# Patient Record
Sex: Female | Born: 1960
Health system: Southern US, Community
[De-identification: ages and names within clinical notes are randomized; demographics above are authoritative.]

## PROBLEM LIST (undated history)

## (undated) DIAGNOSIS — J329 Chronic sinusitis, unspecified: Secondary | ICD-10-CM

## (undated) DIAGNOSIS — Z87898 Personal history of other specified conditions: Secondary | ICD-10-CM

## (undated) DIAGNOSIS — L309 Dermatitis, unspecified: Secondary | ICD-10-CM

## (undated) DIAGNOSIS — I10 Essential (primary) hypertension: Secondary | ICD-10-CM

## (undated) DIAGNOSIS — T8859XA Other complications of anesthesia, initial encounter: Secondary | ICD-10-CM

## (undated) DIAGNOSIS — M1990 Unspecified osteoarthritis, unspecified site: Secondary | ICD-10-CM

## (undated) DIAGNOSIS — G4730 Sleep apnea, unspecified: Secondary | ICD-10-CM

## (undated) DIAGNOSIS — N390 Urinary tract infection, site not specified: Secondary | ICD-10-CM

## (undated) DIAGNOSIS — T4145XA Adverse effect of unspecified anesthetic, initial encounter: Secondary | ICD-10-CM

## (undated) HISTORY — DX: Unspecified osteoarthritis, unspecified site: M19.90

## (undated) HISTORY — DX: Personal history of other specified conditions: Z87.898

## (undated) HISTORY — DX: Chronic sinusitis, unspecified: J32.9

## (undated) HISTORY — DX: Essential (primary) hypertension: I10

## (undated) HISTORY — DX: Dermatitis, unspecified: L30.9

## (undated) HISTORY — DX: Urinary tract infection, site not specified: N39.0

## (undated) MED FILL — Clobetasol Propionate Soln 0.05%: CUTANEOUS | Fill #1 | Status: CN

---
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
## 1982-10-13 HISTORY — PX: OTHER SURGICAL HISTORY: SHX169

## 2002-10-13 HISTORY — PX: OTHER SURGICAL HISTORY: SHX169

## 2003-10-05 ENCOUNTER — Other Ambulatory Visit: Payer: Self-pay

## 2004-12-19 ENCOUNTER — Ambulatory Visit: Payer: Self-pay | Admitting: Internal Medicine

## 2005-09-25 ENCOUNTER — Ambulatory Visit: Payer: Self-pay | Admitting: Internal Medicine

## 2006-02-06 ENCOUNTER — Ambulatory Visit: Payer: Self-pay | Admitting: Obstetrics and Gynecology

## 2007-03-25 ENCOUNTER — Ambulatory Visit: Payer: Self-pay | Admitting: General Practice

## 2007-04-13 ENCOUNTER — Ambulatory Visit: Payer: Self-pay | Admitting: General Practice

## 2008-02-25 ENCOUNTER — Ambulatory Visit: Payer: Self-pay | Admitting: Internal Medicine

## 2008-02-29 ENCOUNTER — Ambulatory Visit: Payer: Self-pay | Admitting: Internal Medicine

## 2008-03-31 ENCOUNTER — Ambulatory Visit: Payer: Self-pay | Admitting: Internal Medicine

## 2008-06-08 ENCOUNTER — Ambulatory Visit: Payer: Self-pay | Admitting: Orthopedic Surgery

## 2008-06-13 ENCOUNTER — Encounter: Payer: Self-pay | Admitting: Orthopedic Surgery

## 2008-07-13 ENCOUNTER — Encounter: Payer: Self-pay | Admitting: Orthopedic Surgery

## 2009-02-22 ENCOUNTER — Ambulatory Visit: Payer: Self-pay | Admitting: Internal Medicine

## 2009-04-05 ENCOUNTER — Ambulatory Visit: Payer: Self-pay | Admitting: Internal Medicine

## 2010-02-21 ENCOUNTER — Encounter: Payer: Self-pay | Admitting: Unknown Physician Specialty

## 2010-03-02 ENCOUNTER — Other Ambulatory Visit: Payer: Self-pay | Admitting: Internal Medicine

## 2010-03-13 ENCOUNTER — Encounter: Payer: Self-pay | Admitting: Unknown Physician Specialty

## 2010-04-09 ENCOUNTER — Ambulatory Visit: Payer: Self-pay | Admitting: Internal Medicine

## 2010-05-03 ENCOUNTER — Ambulatory Visit: Payer: Self-pay | Admitting: Internal Medicine

## 2010-05-17 ENCOUNTER — Other Ambulatory Visit: Payer: Self-pay | Admitting: Unknown Physician Specialty

## 2010-11-22 ENCOUNTER — Ambulatory Visit: Payer: Self-pay | Admitting: Internal Medicine

## 2010-12-05 ENCOUNTER — Encounter: Payer: Self-pay | Admitting: Sports Medicine

## 2010-12-12 ENCOUNTER — Encounter: Payer: Self-pay | Admitting: Sports Medicine

## 2011-10-14 HISTORY — PX: OTHER SURGICAL HISTORY: SHX169

## 2012-04-07 LAB — HM PAP SMEAR

## 2012-04-13 ENCOUNTER — Ambulatory Visit: Payer: Self-pay | Admitting: Unknown Physician Specialty

## 2012-04-13 LAB — HM MAMMOGRAPHY

## 2012-11-29 ENCOUNTER — Encounter: Payer: Self-pay | Admitting: Internal Medicine

## 2012-12-01 ENCOUNTER — Telehealth: Payer: Self-pay | Admitting: Internal Medicine

## 2012-12-01 ENCOUNTER — Encounter: Payer: Self-pay | Admitting: Internal Medicine

## 2012-12-01 ENCOUNTER — Ambulatory Visit (INDEPENDENT_AMBULATORY_CARE_PROVIDER_SITE_OTHER): Payer: 59 | Admitting: Internal Medicine

## 2012-12-01 VITALS — BP 108/78 | HR 80 | Temp 98.4°F | Ht 64.0 in | Wt 122.0 lb

## 2012-12-01 DIAGNOSIS — M6530 Trigger finger, unspecified finger: Secondary | ICD-10-CM

## 2012-12-01 DIAGNOSIS — M549 Dorsalgia, unspecified: Secondary | ICD-10-CM

## 2012-12-01 DIAGNOSIS — I10 Essential (primary) hypertension: Secondary | ICD-10-CM

## 2012-12-01 MED ORDER — TELMISARTAN 40 MG PO TABS: 40.0000 mg | ORAL_TABLET | Freq: Every day | ORAL | Status: DC

## 2012-12-01 NOTE — Telephone Encounter (Signed)
Called patient for clarification. Called pharmacy to correct dose.

## 2012-12-01 NOTE — Telephone Encounter (Signed)
Needing clarification on the patient's Micardis that was e-scripted today.

## 2012-12-04 ENCOUNTER — Other Ambulatory Visit: Payer: Self-pay | Admitting: Internal Medicine

## 2012-12-04 LAB — CBC WITH DIFFERENTIAL/PLATELET
Basophil #: 0 10*3/uL (ref 0.0–0.1)
Eosinophil #: 0.1 10*3/uL (ref 0.0–0.7)
HCT: 40.3 % (ref 35.0–47.0)
HGB: 13.4 g/dL (ref 12.0–16.0)
Lymphocyte #: 1.3 10*3/uL (ref 1.0–3.6)
MCH: 32.5 pg (ref 26.0–34.0)
MCHC: 33.1 g/dL (ref 32.0–36.0)
MCV: 98 fL (ref 80–100)
Neutrophil #: 2.7 10*3/uL (ref 1.4–6.5)
Neutrophil %: 60.4 %
RBC: 4.12 10*6/uL (ref 3.80–5.20)
WBC: 4.5 10*3/uL (ref 3.6–11.0)

## 2012-12-05 ENCOUNTER — Encounter: Payer: Self-pay | Admitting: Internal Medicine

## 2012-12-05 DIAGNOSIS — M6530 Trigger finger, unspecified finger: Secondary | ICD-10-CM | POA: Insufficient documentation

## 2012-12-05 DIAGNOSIS — I10 Essential (primary) hypertension: Secondary | ICD-10-CM | POA: Insufficient documentation

## 2012-12-05 DIAGNOSIS — M549 Dorsalgia, unspecified: Secondary | ICD-10-CM | POA: Insufficient documentation

## 2012-12-05 NOTE — Assessment & Plan Note (Signed)
Some discomfort with palpation as outlined.  Discussed splint.  She prefers to monitor for now.  Follow.

## 2012-12-05 NOTE — Assessment & Plan Note (Signed)
MRI - bilateral neuroforaminal narrowing at the L4-5 level with exiting nerve root compromise and possible mild compression bilaterally.  Mild neuroforaminal narrowing at L5-S1 with possible mild exiting nerve root compromise and mild to moderate thecal sac stenosis at L4-5.  Has seen ortho.  Had physical therapy previously.  Desires no further intervention at this time.  Follow.   Exercise and stretches as discussed.

## 2012-12-05 NOTE — Progress Notes (Signed)
  Subjective:    [name], female    DOB: 03/10/1961, 52 y.o.   MRN: [id number]  HPI 52 year old female with past history of hypertension who comes in today for a scheduled follow up.  She states she has been doing relatively well.  Having some increased discomfort and stiffness in her lower back and hips.  Feels tight.  Some left knee issues as well.  Trigger finger - right third.  Plans to start exercising more.  Does help.  Discussed stretches.  Some fatigue.  Feels she is handling stress relatively well.  Breathing stable.  Blood pressure stable.  Bowels stable.    Past Medical History  Diagnosis Date  . Recurrent sinus infections   . Frequent UTI   . H/O lymphadenopathy     With previous submandibular node removals.  . Eczema   . Hypertension     Current Outpatient Prescriptions on File Prior to Visit  Medication Sig Dispense Refill  . Calcium Carbonate-Vitamin D (CALCIUM-VITAMIN D) 500-200 MG-UNIT per tablet Take 1 tablet by mouth daily.      . fish oil-omega-3 fatty acids 1000 MG capsule Take by mouth daily.      . fluocinonide gel (LIDEX) 0.05 % Apply 1 application topically 2 (two) times daily. Apply to affected area twice a day as needed.      . Multiple Vitamin (MULTIVITAMIN) tablet Take 1 tablet by mouth daily.      Marland Kitchen tretinoin (RETIN-A) 0.05 % cream Apply topically at bedtime. Apply to affected area three times a day as needed for Acne       No current facility-administered medications on file prior to visit.    Review of Systems Patient denies any headache, lightheadedness or dizziness.  No significant sinus or allergy symptoms.  No chest pain, tightness or palpitations.  No increased shortness of breath, cough or congestion.  No nausea or vomiting.  No acid reflux.  No abdominal pain or cramping.  No bowel change, such as diarrhea, constipation, BRBPR or melana.  No urine change.  Back and joint issues as outlined.  Some stiffness.        Objective:   Physical Exam Filed Vitals:   12/01/12 0933  BP: 108/78  Pulse: 80  Temp: 98.4 F (36.9 C)   Blood pressure recheck:  122/82, pulse 46  52 year old female in no acute distress.   HEENT:  Nares- clear.  Oropharynx - without lesions. NECK:  Supple.  Nontender.  No audible bruit.  HEART:  Appears to be regular. LUNGS:  No crackles or wheezing audible.  Respirations even and unlabored.  RADIAL PULSE:  Equal bilaterally.  ABDOMEN:  Soft, nontender.  Bowel sounds present and normal.  No audible abdominal bruit.   EXTREMITIES:  No increased edema present.  DP pulses palpable and equal bilaterally.   MSK:  Increased tenderness to palpation - base of the third finger (right).  Negative straight leg raise.         Assessment & Plan:  INCREASED PSYCHOSOCIAL STRESSORS.  Doing well.  Follow.    HEALTH MAINTENANCE.  Schedule a physical next visit.  Last pap 03/18/12 - negative.  Mammogram 04/13/12 - BiRADS II.  Was to notify me when agreeable for colonoscopy.

## 2012-12-05 NOTE — Assessment & Plan Note (Signed)
Blood pressure is doing well.  Same medication regimen.  Check metabolic panel.  

## 2012-12-06 ENCOUNTER — Telehealth: Payer: Self-pay | Admitting: Internal Medicine

## 2012-12-06 NOTE — Telephone Encounter (Signed)
Patient Information:  Caller Name: [place]  Phone: [phone]  Patient: Sara Perry, Sara Perry  Gender: Female  DOB: [birth year]  Age: 52 Years  PCP: Dale Garceno  Pregnant: No  Office Follow Up:  Does the office need to follow up with this patient?: Yes  Instructions For The Office: Please review and call pt regarding Tamiflu Rx  RN Note:  Pt is requesting Tamiflu; she has not had close contact with anyone with a positive flu test; if MD is willing to call in Tamiflu please call into  [facility] [phone] or [facility] [phone]  Symptoms  Reason For Call & Symptoms: Pt is calling and states that she thinks she has the flu; sx include yellow productive cough, tiredness and fever; feels the cough is in her chest; denies diff breathing;   felt warm and took Motrin and temp 99.0 after Motrin; sx started 12/04/12 in the evening;  Reviewed Health History In EMR: Yes  Reviewed Medications In EMR: Yes  Reviewed Allergies In EMR: Yes  Reviewed Surgeries / Procedures: Yes  Date of Onset of Symptoms: 12/04/2012  Treatments Tried: Motrin; Guifenasen; Sudafed  Treatments Tried Worked: Yes  Any Fever: Yes  Fever Taken: Oral  Fever Time Of Reading: 08:00:00  Fever Last Reading: 99.0 OB / GYN:  LMP: Unknown  Guideline(s) Used:  Influenza - Seasonal  Disposition Per Guideline:   Discuss with PCP and Callback by Nurse Today  Reason For Disposition Reached:   Patient requests antiviral medicine for influenza and flu symptoms present < 48 hours  Advice Given:  Treating the Symptoms of Flu  Fever, Muscle Aches, and Headache: For fever more than 101 F (38.3 C), muscle aches, and headaches, take acetaminophen every 4-6 hours (Adults 650 mg) OR ibuprofen every 6-8 hours (Adults 400-600 mg).  Sore Throat: Use throat lozenges, hard candy or warm chicken broth.  Cough: Use cough drops.  Hydrate: Drink extra liquids. If the air in your home is dry, use a humidifier.  No Aspirin  : Do not use aspirin for treatment of fever or pain (Reason: there is an association between influenza and Reye syndrome).  Expected Course  : The fever lasts 2-3 days, the runny nose 5-10 days, and the cough 2-3 weeks.  Call Back If:  Fever lasts more than 3 days  You become short of breath or worse.  For a Stuffy Nose - Use Nasal Washes:  Introduction: Saline (salt water) nasal irrigation (nasal wash) is an effective and simple home remedy for treating stuffy nose and sinus congestion. The nose can be irrigated by pouring, spraying, or squirting salt water into the nose and then letting it run back out.  For all Fevers  Drink cold fluids to prevent dehydration.

## 2012-12-06 NOTE — Telephone Encounter (Signed)
Discussed pts symptoms.  With fever - on antiinflammatories.  Acute muscle aches.  Called in tamiflu.

## 2012-12-16 ENCOUNTER — Telehealth: Payer: Self-pay | Admitting: *Deleted

## 2012-12-16 NOTE — Telephone Encounter (Signed)
Called patient concerning her labs. Need to ask patient if she had other labs drawn.

## 2012-12-20 NOTE — Telephone Encounter (Signed)
Patient returned call. Patient stated that she did have more labs drawn. She said that she would have other labs sent over to [place].

## 2012-12-20 NOTE — Telephone Encounter (Signed)
Noted.  Hold until receive.   

## 2012-12-23 NOTE — Telephone Encounter (Signed)
Received labs

## 2012-12-24 ENCOUNTER — Telehealth: Payer: Self-pay | Admitting: *Deleted

## 2012-12-24 NOTE — Telephone Encounter (Signed)
Called patient results from [facility].

## 2013-03-22 ENCOUNTER — Encounter: Payer: 59 | Admitting: Internal Medicine

## 2013-05-13 ENCOUNTER — Encounter: Payer: 59 | Admitting: Internal Medicine

## 2013-06-10 ENCOUNTER — Encounter: Payer: 59 | Admitting: Internal Medicine

## 2013-08-02 ENCOUNTER — Other Ambulatory Visit (HOSPITAL_COMMUNITY)
Admission: RE | Admit: 2013-08-02 | Discharge: 2013-08-02 | Disposition: A | Discharge status: Home / Self Care | Payer: 59 | Source: Ambulatory Visit | Attending: Internal Medicine | Admitting: Internal Medicine

## 2013-08-02 ENCOUNTER — Ambulatory Visit (INDEPENDENT_AMBULATORY_CARE_PROVIDER_SITE_OTHER): Payer: 59 | Admitting: Internal Medicine

## 2013-08-02 ENCOUNTER — Encounter: Payer: Self-pay | Admitting: Internal Medicine

## 2013-08-02 VITALS — BP 110/80 | HR 83 | Temp 98.5°F | Ht 64.0 in | Wt 120.8 lb

## 2013-08-02 DIAGNOSIS — R5383 Other fatigue: Secondary | ICD-10-CM

## 2013-08-02 DIAGNOSIS — R5381 Other malaise: Secondary | ICD-10-CM

## 2013-08-02 DIAGNOSIS — Z1151 Encounter for screening for human papillomavirus (HPV): Secondary | ICD-10-CM | POA: Insufficient documentation

## 2013-08-02 DIAGNOSIS — Z01419 Encounter for gynecological examination (general) (routine) without abnormal findings: Secondary | ICD-10-CM | POA: Insufficient documentation

## 2013-08-02 DIAGNOSIS — Z13220 Encounter for screening for lipoid disorders: Secondary | ICD-10-CM

## 2013-08-02 DIAGNOSIS — M549 Dorsalgia, unspecified: Secondary | ICD-10-CM

## 2013-08-02 DIAGNOSIS — Z124 Encounter for screening for malignant neoplasm of cervix: Secondary | ICD-10-CM

## 2013-08-02 DIAGNOSIS — I10 Essential (primary) hypertension: Secondary | ICD-10-CM

## 2013-08-05 ENCOUNTER — Encounter: Payer: Self-pay | Admitting: *Deleted

## 2013-08-07 ENCOUNTER — Encounter: Payer: Self-pay | Admitting: Internal Medicine

## 2013-08-07 ENCOUNTER — Other Ambulatory Visit: Payer: Self-pay | Admitting: Internal Medicine

## 2013-08-07 MED ORDER — FLUOCINONIDE 0.05 % EX GEL: 1.0000 "application " | Freq: Two times a day (BID) | CUTANEOUS | Status: DC

## 2013-08-07 NOTE — Assessment & Plan Note (Signed)
Blood pressure is doing well.  Same medication regimen.  Follow metabolic panel.  

## 2013-08-07 NOTE — Progress Notes (Signed)
Refilled lidex gel.

## 2013-08-07 NOTE — Assessment & Plan Note (Signed)
MRI - bilateral neuroforaminal narrowing at the L4-5 level with exiting nerve root compromise and possible mild compression bilaterally.  Mild neuroforaminal narrowing at L5-S1 with possible mild exiting nerve root compromise and mild to moderate thecal sac stenosis at L4-5.  Has seen ortho.  Had physical therapy previously.  Desires no further intervention at this time.  Follow.   Exercise and stretches have helped.

## 2013-08-07 NOTE — Progress Notes (Signed)
Subjective:    [name], female    DOB: [birth year], 52 y.o.   MRN: [id number]  HPI 52 year old female with past history of hypertension who comes in today to follow up on this as well as for a complete physical exam.  She states she has been doing relatively well.  Was having some increased discomfort and stiffness in her lower back and hips.  She is exercising now.  Walking 3x/week.   This has helped.  She is stretching.   Feels she is handling stress relatively well.  Breathing stable.  Blood pressure stable.  Bowels stable.  She has bunions - bilateral feet.    Past Medical History  Diagnosis Date  . Recurrent sinus infections   . Frequent UTI   . H/O lymphadenopathy     With previous submandibular node removals.  . Eczema   . Hypertension     Current Outpatient Prescriptions on File Prior to Visit  Medication Sig Dispense Refill  . Calcium Carbonate-Vitamin D (CALCIUM-VITAMIN D) 500-200 MG-UNIT per tablet Take 1 tablet by mouth daily.      . fish oil-omega-3 fatty acids 1000 MG capsule Take by mouth daily.      . fluocinonide gel (LIDEX) 0.05 % Apply 1 application topically 2 (two) times daily. Apply to affected area twice a day as needed.      . Multiple Vitamin (MULTIVITAMIN) tablet Take 1 tablet by mouth daily.      Marland Kitchen telmisartan (MICARDIS) 40 MG tablet Take 1/2 tablet by mouth once a day      . tretinoin (RETIN-A) 0.05 % cream Apply topically at bedtime. Apply to affected area three times a day as needed for Acne       No current facility-administered medications on file prior to visit.    Review of Systems Patient denies any headache, lightheadedness or dizziness.  No significant sinus or allergy symptoms.  No chest pain, tightness or palpitations.  No increased shortness of breath, cough or congestion.  No nausea or vomiting.  No acid reflux.  No abdominal pain or cramping.  No bowel change, such as diarrhea, constipation, BRBPR or melana.  No urine change.   Back and joint issues as outlined.  Exercising and stretching helps.  Overall she feels she is doing well.  Blood pressure is doing well.         Objective:   Physical Exam  Filed Vitals:   08/02/13 1454  BP: 110/80  Pulse: 83  Temp: 98.5 F (43.68 C)   52 year old female in no acute distress.   HEENT:  Nares- clear.  Oropharynx - without lesions. NECK:  Supple.  Nontender.  No audible bruit.  HEART:  Appears to be regular. LUNGS:  No crackles or wheezing audible.  Respirations even and unlabored.  RADIAL PULSE:  Equal bilaterally.    BREASTS:  No nipple discharge or nipple retraction present.  Could not appreciate any distinct nodules or axillary adenopathy.  ABDOMEN:  Soft, nontender.  Bowel sounds present and normal.  No audible abdominal bruit.  GU:  Normal external genitalia.  Vaginal vault without lesions.  Cervix identified.  Pap performed. Could not appreciate any adnexal masses or tenderness.   RECTAL:  Heme negative.   EXTREMITIES:  No increased edema present.  DP pulses palpable and equal bilaterally.          Assessment & Plan:  INCREASED PSYCHOSOCIAL STRESSORS.  Doing well.  Follow.    HEALTH MAINTENANCE.  Physical today.  Last pap 03/18/12 - negative.  Mammogram 04/13/12 - BiRADS II.  Was given information to schedule her mammogram for 2014.  Was to notify me when agreeable for colonoscopy.  Not ready yet.  Did agree to IFOB.

## 2013-08-15 ENCOUNTER — Telehealth: Payer: Self-pay | Admitting: *Deleted

## 2013-08-15 ENCOUNTER — Other Ambulatory Visit (INDEPENDENT_AMBULATORY_CARE_PROVIDER_SITE_OTHER): Payer: 59

## 2013-08-15 DIAGNOSIS — R5381 Other malaise: Secondary | ICD-10-CM

## 2013-08-15 DIAGNOSIS — I10 Essential (primary) hypertension: Secondary | ICD-10-CM

## 2013-08-15 DIAGNOSIS — R5383 Other fatigue: Secondary | ICD-10-CM

## 2013-08-15 DIAGNOSIS — Z13220 Encounter for screening for lipoid disorders: Secondary | ICD-10-CM

## 2013-08-15 LAB — COMPREHENSIVE METABOLIC PANEL
ALT: 30 U/L (ref 0–35)
AST: 30 U/L (ref 0–37)
Albumin: 4.4 g/dL (ref 3.5–5.2)
Alkaline Phosphatase: 56 U/L (ref 39–117)
BUN: 15 mg/dL (ref 6–23)
Calcium: 9.7 mg/dL (ref 8.4–10.5)
Glucose, Bld: 107 mg/dL — ABNORMAL HIGH (ref 70–99)
Potassium: 4.5 mEq/L (ref 3.5–5.1)
Sodium: 141 mEq/L (ref 135–145)
Total Bilirubin: 1.7 mg/dL — ABNORMAL HIGH (ref 0.3–1.2)

## 2013-08-15 LAB — CBC WITH DIFFERENTIAL/PLATELET
Basophils Absolute: 0 10*3/uL (ref 0.0–0.1)
Basophils Relative: 0.4 % (ref 0.0–3.0)
Eosinophils Absolute: 0.1 10*3/uL (ref 0.0–0.7)
Eosinophils Relative: 1.3 % (ref 0.0–5.0)
HCT: 40 % (ref 36.0–46.0)
Lymphocytes Relative: 34.3 % (ref 12.0–46.0)
Lymphs Abs: 1.7 10*3/uL (ref 0.7–4.0)
MCHC: 33.7 g/dL (ref 30.0–36.0)
MCV: 95.8 fl (ref 78.0–100.0)
Monocytes Absolute: 0.4 10*3/uL (ref 0.1–1.0)
Neutrophils Relative %: 56.7 % (ref 43.0–77.0)
Platelets: 265 10*3/uL (ref 150.0–400.0)
WBC: 5 10*3/uL (ref 4.5–10.5)

## 2013-08-15 LAB — LIPID PANEL
Cholesterol: 232 mg/dL — ABNORMAL HIGH (ref 0–200)
HDL: 61.4 mg/dL (ref >39.00)
Total CHOL/HDL Ratio: 4
VLDL: 24.6 mg/dL (ref 0.0–40.0)

## 2013-08-15 LAB — LDL CHOLESTEROL, DIRECT: Direct LDL: 148.9 mg/dL

## 2013-08-15 LAB — TSH: TSH: 1.42 u[IU]/mL (ref 0.35–5.50)

## 2013-08-15 NOTE — Telephone Encounter (Signed)
Pt states she would like a liquid for this medication fluocinonide gel (LIDEX) 0.05 %

## 2013-08-16 ENCOUNTER — Other Ambulatory Visit (INDEPENDENT_AMBULATORY_CARE_PROVIDER_SITE_OTHER): Payer: 59

## 2013-08-16 ENCOUNTER — Other Ambulatory Visit: Payer: Self-pay | Admitting: Internal Medicine

## 2013-08-16 DIAGNOSIS — R17 Unspecified jaundice: Secondary | ICD-10-CM

## 2013-08-16 LAB — BILIRUBIN, DIRECT: Bilirubin, Direct: 0.2 mg/dL (ref 0.0–0.3)

## 2013-08-16 NOTE — Telephone Encounter (Signed)
Please call [facility] pharmacy.  I sent in a rx for the lidex gel.  Apparently she is wanting something different (liquid).  I am ok to refill x 1 the Lidex liquid (or suspension).  Apparently she has had filled at this pharmacy previously.  Please cancel the other rx and refill the liquid x 1.  Thanks.

## 2013-08-16 NOTE — Progress Notes (Signed)
Orders placed for f/u direct bilirubin

## 2013-08-17 ENCOUNTER — Other Ambulatory Visit: Payer: Self-pay | Admitting: Internal Medicine

## 2013-08-17 NOTE — Progress Notes (Signed)
F/u liver panel ordered.  

## 2013-08-17 NOTE — Telephone Encounter (Signed)
Pt is requesting to have both the gel & liquid. Pharmacy notified-okay to refill x 1

## 2013-08-18 ENCOUNTER — Encounter: Payer: Self-pay | Admitting: *Deleted

## 2013-09-12 ENCOUNTER — Other Ambulatory Visit: Payer: 59

## 2013-09-13 ENCOUNTER — Other Ambulatory Visit: Payer: 59

## 2013-09-14 ENCOUNTER — Other Ambulatory Visit (INDEPENDENT_AMBULATORY_CARE_PROVIDER_SITE_OTHER): Payer: 59

## 2013-09-14 DIAGNOSIS — R17 Unspecified jaundice: Secondary | ICD-10-CM

## 2013-09-14 LAB — HEPATIC FUNCTION PANEL
ALT: 24 U/L (ref 0–35)
AST: 17 U/L (ref 0–37)
Albumin: 4.4 g/dL (ref 3.5–5.2)
Total Protein: 7 g/dL (ref 6.0–8.3)

## 2013-09-19 ENCOUNTER — Encounter: Payer: Self-pay | Admitting: Internal Medicine

## 2013-09-19 DIAGNOSIS — R945 Abnormal results of liver function studies: Secondary | ICD-10-CM

## 2013-09-20 NOTE — Telephone Encounter (Signed)
error 

## 2013-09-29 NOTE — Telephone Encounter (Signed)
Mailed unread message to pt  

## 2013-11-10 ENCOUNTER — Other Ambulatory Visit: Payer: Self-pay | Admitting: Internal Medicine

## 2014-02-02 ENCOUNTER — Ambulatory Visit (INDEPENDENT_AMBULATORY_CARE_PROVIDER_SITE_OTHER): Payer: 59 | Admitting: Internal Medicine

## 2014-02-02 ENCOUNTER — Encounter: Payer: Self-pay | Admitting: Internal Medicine

## 2014-02-02 VITALS — BP 110/70 | HR 80 | Temp 98.3°F | Ht 64.0 in | Wt 117.8 lb

## 2014-02-02 DIAGNOSIS — M542 Cervicalgia: Secondary | ICD-10-CM

## 2014-02-02 DIAGNOSIS — I10 Essential (primary) hypertension: Secondary | ICD-10-CM

## 2014-02-02 DIAGNOSIS — M549 Dorsalgia, unspecified: Secondary | ICD-10-CM

## 2014-02-02 DIAGNOSIS — E7800 Pure hypercholesterolemia, unspecified: Secondary | ICD-10-CM

## 2014-02-02 NOTE — Progress Notes (Signed)
Pre visit review using our clinic review tool, if applicable. No additional management support is needed unless otherwise documented below in the visit note. 

## 2014-02-02 NOTE — Progress Notes (Signed)
Subjective:    [name], female    DOB: 20-Sep-1961, 53 y.o.   MRN: [id number]  HPI 53 year old female with past history of hypertension who comes in today for a scheduled follow up.  She states she has been doing relatively well.  Was having some increased discomfort and stiffness in her lower back and hips.  She is exercising now.  Walking 3x/week.   This has helped.  No lower back pain.  No leg numbness.  She is stretching.   She has been having increased pain in her neck and shoulders.  Massage therapy helps.  Feels she is handling stress relatively well.  Breathing stable.  Blood pressure stable.  Bowels stable.     Past Medical History  Diagnosis Date  . Recurrent sinus infections   . Frequent UTI   . H/O lymphadenopathy     With previous submandibular node removals.  . Eczema   . Hypertension     Current Outpatient Prescriptions on File Prior to Visit  Medication Sig Dispense Refill  . Calcium Carbonate-Vitamin D (CALCIUM-VITAMIN D) 500-200 MG-UNIT per tablet Take 1 tablet by mouth daily.      . fish oil-omega-3 fatty acids 1000 MG capsule Take by mouth daily.      . fluocinonide gel (LIDEX) 3.30 % Apply 1 application topically 2 (two) times daily. Apply to affected area twice a day as needed.  60 g  0  . Multiple Vitamin (MULTIVITAMIN) tablet Take 1 tablet by mouth daily.      Marland Kitchen telmisartan (MICARDIS) 40 MG tablet TAKE 1/2 TABLET BY MOUTH ONCE DAILY  45 tablet  1  . tretinoin (RETIN-A) 0.05 % cream Apply topically at bedtime. Apply to affected area three times a day as needed for Acne       No current facility-administered medications on file prior to visit.    Review of Systems Patient denies any headache, lightheadedness or dizziness.  No significant sinus or allergy symptoms.  No chest pain, tightness or palpitations.  No increased shortness of breath, cough or congestion.  No nausea or vomiting.  No acid reflux. No abdominal pain or cramping.  No bowel  change, such as diarrhea, constipation, BRBPR or melana.  No urine change. Lower back better.  Some neck and shoulder issues now.   Exercising and stretching helps.  Massage therapy helps.  Overall she feels she is doing well.  Blood pressure is doing well.         Objective:   Physical Exam  Filed Vitals:   02/02/14 1539  BP: 110/70  Pulse: 80  Temp: 98.3 F (8.86 C)   53 year old female in no acute distress.   HEENT:  Nares- clear.  Oropharynx - without lesions. NECK:  Supple.  Nontender.  No audible bruit.  HEART:  Appears to be regular. LUNGS:  No crackles or wheezing audible.  Respirations even and unlabored.  RADIAL PULSE:  Equal bilaterally.   ABDOMEN:  Soft, nontender.  Bowel sounds present and normal.  No audible abdominal bruit.   EXTREMITIES:  No increased edema present.  DP pulses palpable and equal bilaterally.          Assessment & Plan:  INCREASED PSYCHOSOCIAL STRESSORS.  Doing well.  Follow.    HEALTH MAINTENANCE.  Physical 08/02/13.  Last pap 03/18/12 - negative.  Mammogram 04/13/12 - BiRADS II.  Was given information to schedule her mammogram for 2014.  Do not have results.  Was to  notify me when agreeable for colonoscopy.    I spent 25 minutes with the patient and more than 50% of the time was spent in consultation regarding the above.

## 2014-02-05 ENCOUNTER — Encounter: Payer: Self-pay | Admitting: Internal Medicine

## 2014-02-05 DIAGNOSIS — M542 Cervicalgia: Secondary | ICD-10-CM | POA: Insufficient documentation

## 2014-02-05 DIAGNOSIS — E7800 Pure hypercholesterolemia, unspecified: Secondary | ICD-10-CM | POA: Insufficient documentation

## 2014-02-05 NOTE — Assessment & Plan Note (Signed)
Blood pressure is doing well.  Same medication regimen.  Follow metabolic panel.  

## 2014-02-05 NOTE — Assessment & Plan Note (Signed)
Low cholesterol diet and exercise.  Follow lipid profile.    

## 2014-02-05 NOTE — Assessment & Plan Note (Signed)
Neck and shoulder pain as outlined.  Massage therapy helping.  rx given for massage therapy.

## 2014-02-05 NOTE — Assessment & Plan Note (Signed)
MRI - bilateral neuroforaminal narrowing at the L4-5 level with exiting nerve root compromise and possible mild compression bilaterally.  Mild neuroforaminal narrowing at L5-S1 with possible mild exiting nerve root compromise and mild to moderate thecal sac stenosis at L4-5.  Has seen ortho.  Had physical therapy previously.  Desires no further intervention at this time.  Follow.   Exercise and stretches have helped.  Back and legs better.

## 2014-02-22 ENCOUNTER — Other Ambulatory Visit (INDEPENDENT_AMBULATORY_CARE_PROVIDER_SITE_OTHER): Payer: 59

## 2014-02-22 ENCOUNTER — Other Ambulatory Visit: Payer: 59

## 2014-02-22 DIAGNOSIS — E7800 Pure hypercholesterolemia, unspecified: Secondary | ICD-10-CM

## 2014-02-22 DIAGNOSIS — I10 Essential (primary) hypertension: Secondary | ICD-10-CM

## 2014-02-22 LAB — COMPREHENSIVE METABOLIC PANEL
ALBUMIN: 4.4 g/dL (ref 3.5–5.2)
ALT: 17 U/L (ref 0–35)
AST: 16 U/L (ref 0–37)
Alkaline Phosphatase: 49 U/L (ref 39–117)
BILIRUBIN TOTAL: 2.1 mg/dL — AB (ref 0.2–1.2)
BUN: 15 mg/dL (ref 6–23)
CHLORIDE: 103 meq/L (ref 96–112)
CO2: 29 meq/L (ref 19–32)
Calcium: 9.6 mg/dL (ref 8.4–10.5)
Creatinine, Ser: 0.7 mg/dL (ref 0.4–1.2)
GFR: 93.21 mL/min (ref >60.00)
GLUCOSE: 85 mg/dL (ref 70–99)
POTASSIUM: 3.9 meq/L (ref 3.5–5.1)
Sodium: 139 mEq/L (ref 135–145)
TOTAL PROTEIN: 6.7 g/dL (ref 6.0–8.3)

## 2014-02-22 LAB — LIPID PANEL
Cholesterol: 224 mg/dL — ABNORMAL HIGH (ref 0–200)
HDL: 61 mg/dL (ref >39.00)
LDL Cholesterol: 144 mg/dL — ABNORMAL HIGH (ref 0–99)
Total CHOL/HDL Ratio: 4
Triglycerides: 97 mg/dL (ref 0.0–149.0)
VLDL: 19.4 mg/dL (ref 0.0–40.0)

## 2014-02-23 ENCOUNTER — Other Ambulatory Visit (INDEPENDENT_AMBULATORY_CARE_PROVIDER_SITE_OTHER): Payer: 59

## 2014-02-23 ENCOUNTER — Other Ambulatory Visit: Payer: Self-pay | Admitting: Internal Medicine

## 2014-02-23 DIAGNOSIS — R17 Unspecified jaundice: Secondary | ICD-10-CM

## 2014-02-23 LAB — BILIRUBIN, DIRECT: BILIRUBIN DIRECT: 0.2 mg/dL (ref 0.0–0.3)

## 2014-02-23 NOTE — Progress Notes (Signed)
Order placed for direct bilirubin 

## 2014-02-27 ENCOUNTER — Encounter: Payer: Self-pay | Admitting: Internal Medicine

## 2014-02-28 ENCOUNTER — Other Ambulatory Visit: Payer: 59

## 2014-03-01 NOTE — Telephone Encounter (Signed)
Unread mychart message mailed to patient 

## 2014-03-02 NOTE — Telephone Encounter (Signed)
Order placed for abdominal ultrasound.   

## 2014-03-03 NOTE — Telephone Encounter (Signed)
Patient aware of date/time and location/msn

## 2014-03-09 ENCOUNTER — Ambulatory Visit: Payer: Self-pay | Admitting: Internal Medicine

## 2014-03-09 ENCOUNTER — Other Ambulatory Visit: Payer: Self-pay | Admitting: Internal Medicine

## 2014-03-09 DIAGNOSIS — R7989 Other specified abnormal findings of blood chemistry: Secondary | ICD-10-CM

## 2014-03-09 DIAGNOSIS — R945 Abnormal results of liver function studies: Secondary | ICD-10-CM

## 2014-03-09 NOTE — Progress Notes (Signed)
Order placed for limited ultrasound.

## 2014-04-18 ENCOUNTER — Telehealth: Payer: Self-pay | Admitting: Internal Medicine

## 2014-04-18 NOTE — Telephone Encounter (Signed)
Pt came in to office with paperwork to be completed by Dr. Nicki Reaper for medical reimbursement.  Copy made, fee form attached; placed in Dr. Bary Leriche box.  Pt would like a call when ready for pick up.

## 2014-04-19 NOTE — Telephone Encounter (Signed)
See attached.  Just need to know test.  See her message and my previous message.

## 2014-04-19 NOTE — Telephone Encounter (Signed)
Placed in your folder.

## 2014-04-19 NOTE — Telephone Encounter (Signed)
The form she wants completed - what test is this for?  Is this for her abdominal ultrasound.  Just let me know and will complete.  Thanks.

## 2014-04-20 NOTE — Telephone Encounter (Signed)
See below

## 2014-04-21 NOTE — Telephone Encounter (Signed)
It was exercise (as prescribed)-she states that's why she left you both forms

## 2014-04-24 NOTE — Telephone Encounter (Signed)
I completed her form.  In your box.  There is nothing on there where she has signed stating ok for [place] to send medical information.  I have included a medical diagnosis on her papers.  Need to make sure ok with her to send.

## 2014-04-25 ENCOUNTER — Encounter: Payer: Self-pay | Admitting: *Deleted

## 2014-04-25 NOTE — Telephone Encounter (Signed)
Sent [employer]. Will wait on response prior to faxing from.

## 2014-04-26 NOTE — Telephone Encounter (Signed)
Pt returned call & gave permission to fax form. Form faxed

## 2014-04-26 NOTE — Telephone Encounter (Signed)
Left detailed message on patient's voice mail.

## 2014-04-26 NOTE — Telephone Encounter (Signed)
Left message to call back with verbal okay to fax form

## 2014-04-30 ENCOUNTER — Telehealth: Payer: Self-pay | Admitting: Internal Medicine

## 2014-04-30 NOTE — Telephone Encounter (Signed)
Pt notified of ultrasound results and need for non fasting lab within the next two weeks.  Please schedule and contact her with an appt date and time. Thanks.

## 2014-05-01 NOTE — Telephone Encounter (Signed)
Spoke with pt, she will call back for appointment.

## 2014-05-04 ENCOUNTER — Encounter: Payer: Self-pay | Admitting: Internal Medicine

## 2014-05-15 ENCOUNTER — Encounter: Payer: Self-pay | Admitting: Internal Medicine

## 2014-05-15 ENCOUNTER — Other Ambulatory Visit (INDEPENDENT_AMBULATORY_CARE_PROVIDER_SITE_OTHER): Payer: 59

## 2014-05-15 DIAGNOSIS — R17 Unspecified jaundice: Secondary | ICD-10-CM

## 2014-05-15 LAB — HEPATIC FUNCTION PANEL
ALT: 18 U/L (ref 0–35)
AST: 19 U/L (ref 0–37)
Albumin: 4.2 g/dL (ref 3.5–5.2)
Alkaline Phosphatase: 56 U/L (ref 39–117)
Bilirubin, Direct: 0.2 mg/dL (ref 0.0–0.3)
Total Bilirubin: 1.3 mg/dL — ABNORMAL HIGH (ref 0.2–1.2)
Total Protein: 6.4 g/dL (ref 6.0–8.3)

## 2014-05-17 NOTE — Telephone Encounter (Signed)
Unread mychart message mailed to patient 

## 2014-05-25 ENCOUNTER — Other Ambulatory Visit: Payer: Self-pay | Admitting: Internal Medicine

## 2014-08-08 ENCOUNTER — Encounter: Payer: Self-pay | Admitting: Internal Medicine

## 2014-08-08 ENCOUNTER — Ambulatory Visit (INDEPENDENT_AMBULATORY_CARE_PROVIDER_SITE_OTHER): Payer: 59 | Admitting: Internal Medicine

## 2014-08-08 VITALS — BP 120/70 | HR 80 | Temp 98.7°F | Ht 64.0 in | Wt 114.2 lb

## 2014-08-08 DIAGNOSIS — M545 Low back pain: Secondary | ICD-10-CM

## 2014-08-08 DIAGNOSIS — I10 Essential (primary) hypertension: Secondary | ICD-10-CM

## 2014-08-08 DIAGNOSIS — Z1239 Encounter for other screening for malignant neoplasm of breast: Secondary | ICD-10-CM

## 2014-08-08 DIAGNOSIS — E7800 Pure hypercholesterolemia, unspecified: Secondary | ICD-10-CM

## 2014-08-08 DIAGNOSIS — M79673 Pain in unspecified foot: Secondary | ICD-10-CM

## 2014-08-08 NOTE — Progress Notes (Signed)
Pre visit review using our clinic review tool, if applicable. No additional management support is needed unless otherwise documented below in the visit note. 

## 2014-08-13 ENCOUNTER — Encounter: Payer: Self-pay | Admitting: Internal Medicine

## 2014-08-13 DIAGNOSIS — M79673 Pain in unspecified foot: Secondary | ICD-10-CM | POA: Insufficient documentation

## 2014-08-13 NOTE — Progress Notes (Signed)
Subjective:    [name], female    DOB: 1961-01-18, 53 y.o.   MRN: [id number]  HPI 53 year old female with past history of hypertension who comes in today to follow up on these issues as well as for a complete physical exam.  She states she has been doing relatively well.  Was having some increased discomfort and stiffness in her lower back and hips.  She is exercising now.  Walking 3x/week.  This has helped.  Feels she is handling stress relatively well.  Breathing stable.  Blood pressure stable.  Bowels stable.  She is having pain in both of her feet.  Request referral to podiatry.  She will call back with the name.     Past Medical History  Diagnosis Date  . Recurrent sinus infections   . Frequent UTI   . H/O lymphadenopathy     With previous submandibular node removals.  . Eczema   . Hypertension     Current Outpatient Prescriptions on File Prior to Visit  Medication Sig Dispense Refill  . Calcium Carbonate-Vitamin D (CALCIUM-VITAMIN D) 500-200 MG-UNIT per tablet Take 1 tablet by mouth daily.    . fish oil-omega-3 fatty acids 1000 MG capsule Take by mouth daily.    . fluocinonide (LIDEX) 0.05 % external solution Apply 1 application topically as needed.    . fluocinonide gel (LIDEX) 3.15 % Apply 1 application topically 2 (two) times daily. Apply to affected area twice a day as needed. 60 g 0  . Multiple Vitamin (MULTIVITAMIN) tablet Take 1 tablet by mouth daily.    Marland Kitchen telmisartan (MICARDIS) 40 MG tablet TAKE 1/2 TABLET BY MOUTH ONCE DAILY 45 tablet 1  . tretinoin (RETIN-A) 0.05 % cream Apply topically at bedtime. Apply to affected area three times a day as needed for Acne     No current facility-administered medications on file prior to visit.    Review of Systems Patient denies any headache, lightheadedness or dizziness.  No significant sinus or allergy symptoms.  No chest pain, tightness or palpitations.  No increased shortness of breath, cough or  congestion.  No nausea or vomiting.  No acid reflux. No abdominal pain or cramping.  No bowel change, such as diarrhea, constipation, BRBPR or melana.  No urine change. Lower back better.   Exercising and stretching helps.  Is walking.  Overall she feels she is doing well.  Blood pressure is doing well.  Foot pain as outlined.         Objective:   Physical Exam  Filed Vitals:   08/08/14 1553  BP: 120/70  Pulse: 80  Temp: 98.7 F (37.1 C)   Blood pressure recheck:  32/57  53 year old female in no acute distress.   HEENT:  Nares- clear.  Oropharynx - without lesions. NECK:  Supple.  Nontender.  No audible bruit.  HEART:  Appears to be regular. LUNGS:  No crackles or wheezing audible.  Respirations even and unlabored.  RADIAL PULSE:  Equal bilaterally.    BREASTS:  No nipple discharge or nipple retraction present.  Could not appreciate any distinct nodules or axillary adenopathy.  ABDOMEN:  Soft, nontender.  Bowel sounds present and normal.  No audible abdominal bruit.  GU:  Not performed.    EXTREMITIES:  No increased edema present.  DP pulses palpable and equal bilaterally.          Assessment & Plan:  INCREASED PSYCHOSOCIAL STRESSORS.  Doing well.  Follow.  Breast cancer screening - MM DIGITAL SCREENING BILATERAL; Future  Essential hypertension, benign Blood pressure doing well.  Follow.    Low back pain without sciatica, unspecified back pain laterality Better with exercising.  Follow.   Hypercholesterolemia Low cholesterol diet.  Follow lipid panel.   Foot pain, unspecified laterality Pain as outlined.  Persistent.  Will refer to podiatry.  She will call back with the name of a podiatrist.     HEALTH MAINTENANCE.  Physical today.  Last pap 07/23/13 - negative with negative HPV.  Mammogram 04/13/12 - BiRADS II.  Was given information to schedule her mammogram for 2014.  She did not schedule.  Overdue.  Will schedule.  Notify me when agreeable for colonoscopy.    I  spent 25 minutes with the patient and more than 50% of the time was spent in consultation regarding the above.

## 2014-08-21 ENCOUNTER — Other Ambulatory Visit (INDEPENDENT_AMBULATORY_CARE_PROVIDER_SITE_OTHER): Payer: 59

## 2014-08-21 DIAGNOSIS — I10 Essential (primary) hypertension: Secondary | ICD-10-CM

## 2014-08-21 DIAGNOSIS — E7800 Pure hypercholesterolemia, unspecified: Secondary | ICD-10-CM

## 2014-08-21 LAB — LIPID PANEL
CHOL/HDL RATIO: 4
CHOLESTEROL: 210 mg/dL — AB (ref 0–200)
HDL: 58.5 mg/dL (ref >39.00)
LDL Cholesterol: 136 mg/dL — ABNORMAL HIGH (ref 0–99)
NonHDL: 151.5
TRIGLYCERIDES: 80 mg/dL (ref 0.0–149.0)
VLDL: 16 mg/dL (ref 0.0–40.0)

## 2014-08-21 LAB — COMPREHENSIVE METABOLIC PANEL
ALK PHOS: 58 U/L (ref 39–117)
ALT: 21 U/L (ref 0–35)
AST: 20 U/L (ref 0–37)
Albumin: 3.7 g/dL (ref 3.5–5.2)
BILIRUBIN TOTAL: 1.6 mg/dL — AB (ref 0.2–1.2)
BUN: 12 mg/dL (ref 6–23)
CO2: 28 mEq/L (ref 19–32)
CREATININE: 0.8 mg/dL (ref 0.4–1.2)
Calcium: 9.7 mg/dL (ref 8.4–10.5)
Chloride: 102 mEq/L (ref 96–112)
GFR: 79.74 mL/min (ref >60.00)
GLUCOSE: 91 mg/dL (ref 70–99)
Potassium: 4.9 mEq/L (ref 3.5–5.1)
SODIUM: 140 meq/L (ref 135–145)
TOTAL PROTEIN: 6.8 g/dL (ref 6.0–8.3)

## 2014-08-21 LAB — CBC WITH DIFFERENTIAL/PLATELET
Basophils Absolute: 0 10*3/uL (ref 0.0–0.1)
Basophils Relative: 0.3 % (ref 0.0–3.0)
EOS PCT: 1.4 % (ref 0.0–5.0)
Eosinophils Absolute: 0.1 10*3/uL (ref 0.0–0.7)
HEMATOCRIT: 40.2 % (ref 36.0–46.0)
Hemoglobin: 13 g/dL (ref 12.0–15.0)
LYMPHS ABS: 1.6 10*3/uL (ref 0.7–4.0)
Lymphocytes Relative: 31.6 % (ref 12.0–46.0)
MCHC: 32.3 g/dL (ref 30.0–36.0)
MCV: 97.7 fl (ref 78.0–100.0)
MONOS PCT: 8.2 % (ref 3.0–12.0)
Monocytes Absolute: 0.4 10*3/uL (ref 0.1–1.0)
Neutro Abs: 3 10*3/uL (ref 1.4–7.7)
Neutrophils Relative %: 58.5 % (ref 43.0–77.0)
Platelets: 275 10*3/uL (ref 150.0–400.0)
RBC: 4.12 Mil/uL (ref 3.87–5.11)
RDW: 12.9 % (ref 11.5–15.5)
WBC: 5.1 10*3/uL (ref 4.0–10.5)

## 2014-08-21 LAB — TSH: TSH: 1.67 u[IU]/mL (ref 0.35–4.50)

## 2014-08-22 ENCOUNTER — Other Ambulatory Visit: Payer: Self-pay | Admitting: Internal Medicine

## 2014-08-22 ENCOUNTER — Other Ambulatory Visit (INDEPENDENT_AMBULATORY_CARE_PROVIDER_SITE_OTHER): Payer: 59

## 2014-08-22 LAB — BILIRUBIN, DIRECT: Bilirubin, Direct: 0.2 mg/dL (ref 0.0–0.3)

## 2014-08-22 NOTE — Progress Notes (Signed)
Order placed for add on lab.  °

## 2014-08-23 ENCOUNTER — Encounter: Payer: Self-pay | Admitting: Internal Medicine

## 2014-11-06 ENCOUNTER — Ambulatory Visit: Payer: Self-pay | Admitting: Internal Medicine

## 2014-11-06 LAB — HM MAMMOGRAPHY: HM MAMMO: NEGATIVE

## 2014-11-07 ENCOUNTER — Encounter: Payer: Self-pay | Admitting: *Deleted

## 2014-11-10 ENCOUNTER — Encounter: Payer: Self-pay | Admitting: Internal Medicine

## 2015-01-17 ENCOUNTER — Other Ambulatory Visit: Payer: Self-pay | Admitting: Internal Medicine

## 2015-02-01 ENCOUNTER — Telehealth: Payer: Self-pay

## 2015-02-01 DIAGNOSIS — M545 Low back pain, unspecified: Secondary | ICD-10-CM

## 2015-02-01 NOTE — Telephone Encounter (Signed)
Order placed for neurosurgery referral - Dr Sherwood Gambler.

## 2015-02-01 NOTE — Telephone Encounter (Signed)
Patient called Sara Perry requesting a Referral to a Neuro Specialist for her back.   Called the patient to get clarity.  Patient had tried to schedule an appt. with Dr. Sherwood Gambler a Neurosurgery and spine specialist on her own, but the doctor's office refused without a referral.  Patient stated that her pain is more in her hip region and is much worse then when you saw her last year. She believe this to be related to her back not her legs like the past.  She has a upcoming appt in May.   Verbalized that if you have other options for a referral she is open to options.  Please advise?

## 2015-02-02 NOTE — Telephone Encounter (Signed)
Left message, notifying pt referral has been placed and [facility] will contact with an appt.

## 2015-02-08 ENCOUNTER — Ambulatory Visit: Payer: 59 | Admitting: Internal Medicine

## 2015-02-27 ENCOUNTER — Other Ambulatory Visit: Payer: Self-pay | Admitting: Neurosurgery

## 2015-02-27 DIAGNOSIS — M4726 Other spondylosis with radiculopathy, lumbar region: Secondary | ICD-10-CM

## 2015-03-08 ENCOUNTER — Ambulatory Visit: Payer: 59 | Admitting: Internal Medicine

## 2015-03-09 ENCOUNTER — Ambulatory Visit
Admission: RE | Admit: 2015-03-09 | Discharge: 2015-03-09 | Disposition: A | Discharge status: Home / Self Care | Payer: 59 | Source: Ambulatory Visit | Attending: Neurosurgery | Admitting: Neurosurgery

## 2015-03-09 DIAGNOSIS — M5136 Other intervertebral disc degeneration, lumbar region: Secondary | ICD-10-CM | POA: Diagnosis not present

## 2015-03-09 DIAGNOSIS — M5387 Other specified dorsopathies, lumbosacral region: Secondary | ICD-10-CM | POA: Diagnosis not present

## 2015-03-09 DIAGNOSIS — M4726 Other spondylosis with radiculopathy, lumbar region: Secondary | ICD-10-CM

## 2015-03-09 DIAGNOSIS — M47816 Spondylosis without myelopathy or radiculopathy, lumbar region: Secondary | ICD-10-CM | POA: Diagnosis not present

## 2015-05-29 ENCOUNTER — Encounter: Payer: Self-pay | Admitting: Internal Medicine

## 2015-05-29 ENCOUNTER — Ambulatory Visit (INDEPENDENT_AMBULATORY_CARE_PROVIDER_SITE_OTHER): Payer: 59 | Admitting: Internal Medicine

## 2015-05-29 VITALS — BP 116/72 | HR 94 | Temp 98.2°F | Ht 64.0 in | Wt 118.4 lb

## 2015-05-29 DIAGNOSIS — E7800 Pure hypercholesterolemia, unspecified: Secondary | ICD-10-CM

## 2015-05-29 DIAGNOSIS — I10 Essential (primary) hypertension: Secondary | ICD-10-CM

## 2015-05-29 DIAGNOSIS — R5383 Other fatigue: Secondary | ICD-10-CM | POA: Diagnosis not present

## 2015-05-29 DIAGNOSIS — M545 Low back pain: Secondary | ICD-10-CM | POA: Diagnosis not present

## 2015-05-29 DIAGNOSIS — R0683 Snoring: Secondary | ICD-10-CM

## 2015-05-29 NOTE — Progress Notes (Signed)
Pre visit review using our clinic review tool, if applicable. No additional management support is needed unless otherwise documented below in the visit note. 

## 2015-05-29 NOTE — Progress Notes (Signed)
[name], female   DOB: 06-30-1961, 54 y.o.   MRN: [id number]   Subjective:    [name], female    DOB: 04-Feb-1961, 54 y.o.   MRN: [id number]  HPI  Patient here for a scheduled follow up.  She has been having problems with her back.  Just saw neurosurgery.  Had MRI.  See report.  Using voltaren gel, doing yoga and other stretches and is avoiding running.  Can do the eliptical machine.  Back is doing better.  She does report increased fatigue.  Wakes up - not feeling rested.  Does snore.  waks herself up sometimes.  Daytime somnolence and fatigue.  Discussed possibility or sleep apnea.  Stays active.  No cardiac symptoms with increased activity or exertion.  No sob.  Watches her diet.  Bowels stable.     Past Medical History  Diagnosis Date  . Recurrent sinus infections   . Frequent UTI   . H/O lymphadenopathy     With previous submandibular node removals.  . Eczema   . Hypertension    Family history and social history reviewed.    Outpatient Encounter Prescriptions as of 05/29/2015  Medication Sig  . diclofenac (VOLTAREN) 75 MG EC tablet Take 75 mg by mouth 2 (two) times daily.  . Calcium Carbonate-Vitamin D (CALCIUM-VITAMIN D) 500-200 MG-UNIT per tablet Take 1 tablet by mouth daily.  . fish oil-omega-3 fatty acids 1000 MG capsule Take by mouth daily.  . fluocinonide (LIDEX) 0.05 % external solution Apply 1 application topically as needed.  . fluocinonide gel (LIDEX) 7.51 % Apply 1 application topically 2 (two) times daily. Apply to affected area twice a day as needed.  . Multiple Vitamin (MULTIVITAMIN) tablet Take 1 tablet by mouth daily.  Marland Kitchen telmisartan (MICARDIS) 40 MG tablet TAKE 1/2 TABLET BY MOUTH ONCE DAILY  . tretinoin (RETIN-A) 0.05 % cream Apply topically at bedtime. Apply to affected area three times a day as needed for Acne  . [DISCONTINUED] meloxicam (MOBIC) 7.5 MG tablet Take 7.5 mg by mouth daily.   No facility-administered  encounter medications on file as of 05/29/2015.    Review of Systems  Constitutional: Positive for fatigue. Negative for appetite change and unexpected weight change.  HENT: Negative for congestion and sinus pressure.   Respiratory: Negative for cough, chest tightness and shortness of breath.   Cardiovascular: Negative for chest pain, palpitations and leg swelling.  Gastrointestinal: Negative for nausea, vomiting, abdominal pain and diarrhea.  Genitourinary: Negative for dysuria and difficulty urinating.  Musculoskeletal: Positive for back pain (stable as outlined.  work up as outlined.  ). Negative for myalgias.  Skin: Negative for color change and rash.  Neurological: Negative for dizziness, light-headedness and headaches.  Hematological: Negative for adenopathy. Does not bruise/bleed easily.  Psychiatric/Behavioral: Negative for dysphoric mood and agitation.       Objective:    Physical Exam  Constitutional: She appears well-developed and well-nourished. No distress.  HENT:  Nose: Nose normal.  Mouth/Throat: Oropharynx is clear and moist.  Eyes: Conjunctivae are normal. Right eye exhibits no discharge. Left eye exhibits no discharge.  Neck: Neck supple. No thyromegaly present.  Cardiovascular: Normal rate and regular rhythm.   Pulmonary/Chest: Breath sounds normal. No respiratory distress. She has no wheezes.  Abdominal: Soft. Bowel sounds are normal. There is no tenderness.  Musculoskeletal: She exhibits no edema or tenderness.  Lymphadenopathy:    She has no cervical adenopathy.  Skin: No rash noted. No erythema.  Psychiatric: She has a normal mood and affect. Her behavior is normal.    BP 116/72 mmHg  Pulse 94  Temp(Src) 98.2 F (36.8 C) (Oral)  Ht 5\' 4"  (1.626 m)  Wt 118 lb 6.4 oz (53.706 kg)  BMI 20.31 kg/m2  SpO2 97%  LMP 12/01/2008 Wt Readings from Last 3 Encounters:  05/29/15 118 lb 6.4 oz (53.706 kg)  08/08/14 114 lb 4 oz (51.823 kg)  02/02/14 117 lb 12 oz  (53.411 kg)     Lab Results  Component Value Date   WBC 5.1 08/21/2014   HGB 13.0 08/21/2014   HCT 40.2 08/21/2014   PLT 275.0 08/21/2014   GLUCOSE 91 08/21/2014   CHOL 210* 08/21/2014   TRIG 80.0 08/21/2014   HDL 58.50 08/21/2014   LDLDIRECT 148.9 08/15/2013   LDLCALC 136* 08/21/2014   ALT 21 08/21/2014   AST 20 08/21/2014   NA 140 08/21/2014   K 4.9 08/21/2014   CL 102 08/21/2014   CREATININE 0.8 08/21/2014   BUN 12 08/21/2014   CO2 28 08/21/2014   TSH 1.67 08/21/2014    Mr Lumbar Spine Wo Contrast  03/09/2015   CLINICAL DATA:  Low back pain. Bilateral hip pain extending into both legs.  EXAM: MRI LUMBAR SPINE WITHOUT CONTRAST  TECHNIQUE: Multiplanar, multisequence MR imaging of the lumbar spine was performed. No intravenous contrast was administered.  COMPARISON:  11/22/2010  FINDINGS: The lowest lumbar type non-rib-bearing vertebra is labeled as L5. The conus medullaris appears normal. Conus level: L1- 2.  There is 7 mm anterolisthesis at L4-5 and 5 mm anterolisthesis at L5-S1 without pars defects identified. Disc desiccation noted at both of these levels. Thin lipoma of the filum terminale as shown on image 8 series 3.  Additional findings at individual levels are as follows:  L1-2:  No impingement.  Minimal disc bulge.  L2-3:  Unremarkable.  L3-4:  No impingement.  Mild disc bulge.  L4-5: Prominent central narrowing of the thecal sac with moderate bilateral foraminal stenosis and moderate bilateral subarticular lateral recess stenosis due to facet arthropathy, ligamentum flavum redundancy common disc uncovering, and underlying slight disc bulge.  L5-S1: Mild right foraminal stenosis due to facet arthropathy and disc uncovering.  IMPRESSION: 1. Lumbar spondylosis and degenerative disc disease along with degenerative subluxations at L4-5 and L5-S1 contributing to prominent impingement at L4-5 and mild impingement at L5-S 1, as detailed above.   Electronically Signed   By: Van Clines M.D.   On: 03/09/2015 16:24       Assessment & Plan:   Problem List Items Addressed This Visit    Back pain    MRI reviewed.  Saw neurosurgery.  Doing yoga and exercise.  Overall dong well now.  Follow.  Desires no further intervention at this time.        Relevant Medications   diclofenac (VOLTAREN) 75 MG EC tablet   Essential hypertension, benign    Blood pressure under good control.  Discussed the possibility of decreasing micardis.  She wants to continue current medication regimen for now.  Follow pressures.  Follow metabolic panel.        Relevant Orders   Basic metabolic panel   Fatigue - Primary    Increased fatigue, daytime somnolence, snoring and waking herself up at night - snoring.  Will schedule for split night sleep study.  Further w/up and treatment pending results.        Relevant Orders   Ambulatory referral to Sleep Studies  CBC with Differential/Platelet   TSH   Hypercholesterolemia    Low cholesterol diet and exercise.  Follow lipid panel.        Relevant Orders   Lipid panel    Other Visit Diagnoses    Snoring        Relevant Orders    Ambulatory referral to Sleep Studies    Hyperbilirubinemia        Relevant Orders    Hepatic function panel        Einar Pheasant, MD

## 2015-06-03 ENCOUNTER — Encounter: Payer: Self-pay | Admitting: Internal Medicine

## 2015-06-03 DIAGNOSIS — R5383 Other fatigue: Secondary | ICD-10-CM | POA: Insufficient documentation

## 2015-06-03 DIAGNOSIS — Z0000 Encounter for general adult medical examination without abnormal findings: Secondary | ICD-10-CM | POA: Insufficient documentation

## 2015-06-03 NOTE — Assessment & Plan Note (Signed)
Low cholesterol diet and exercise.  Follow lipid panel.   

## 2015-06-03 NOTE — Assessment & Plan Note (Signed)
Increased fatigue, daytime somnolence, snoring and waking herself up at night - snoring.  Will schedule for split night sleep study.  Further w/up and treatment pending results.

## 2015-06-03 NOTE — Assessment & Plan Note (Signed)
MRI reviewed.  Saw neurosurgery.  Doing yoga and exercise.  Overall dong well now.  Follow.  Desires no further intervention at this time.

## 2015-06-03 NOTE — Assessment & Plan Note (Signed)
Blood pressure under good control.  Discussed the possibility of decreasing micardis.  She wants to continue current medication regimen for now.  Follow pressures.  Follow metabolic panel.

## 2015-06-06 ENCOUNTER — Other Ambulatory Visit (INDEPENDENT_AMBULATORY_CARE_PROVIDER_SITE_OTHER): Payer: 59

## 2015-06-06 DIAGNOSIS — I10 Essential (primary) hypertension: Secondary | ICD-10-CM | POA: Diagnosis not present

## 2015-06-06 DIAGNOSIS — R5383 Other fatigue: Secondary | ICD-10-CM | POA: Diagnosis not present

## 2015-06-06 DIAGNOSIS — E7800 Pure hypercholesterolemia, unspecified: Secondary | ICD-10-CM

## 2015-06-06 LAB — HEPATIC FUNCTION PANEL
ALK PHOS: 69 U/L (ref 39–117)
ALT: 444 U/L — ABNORMAL HIGH (ref 0–35)
AST: 139 U/L — ABNORMAL HIGH (ref 0–37)
Albumin: 4.4 g/dL (ref 3.5–5.2)
BILIRUBIN DIRECT: 0.3 mg/dL (ref 0.0–0.3)
BILIRUBIN TOTAL: 1.5 mg/dL — AB (ref 0.2–1.2)
TOTAL PROTEIN: 7 g/dL (ref 6.0–8.3)

## 2015-06-06 LAB — CBC WITH DIFFERENTIAL/PLATELET
BASOS ABS: 0 10*3/uL (ref 0.0–0.1)
Basophils Relative: 0.4 % (ref 0.0–3.0)
Eosinophils Absolute: 0 10*3/uL (ref 0.0–0.7)
Eosinophils Relative: 1 % (ref 0.0–5.0)
HCT: 38.9 % (ref 36.0–46.0)
Hemoglobin: 12.9 g/dL (ref 12.0–15.0)
LYMPHS ABS: 1.3 10*3/uL (ref 0.7–4.0)
Lymphocytes Relative: 30 % (ref 12.0–46.0)
MCHC: 33.3 g/dL (ref 30.0–36.0)
MCV: 97.2 fl (ref 78.0–100.0)
MONOS PCT: 9.4 % (ref 3.0–12.0)
Monocytes Absolute: 0.4 10*3/uL (ref 0.1–1.0)
NEUTROS PCT: 59.2 % (ref 43.0–77.0)
Neutro Abs: 2.6 10*3/uL (ref 1.4–7.7)
PLATELETS: 287 10*3/uL (ref 150.0–400.0)
RBC: 4 Mil/uL (ref 3.87–5.11)
RDW: 13.4 % (ref 11.5–15.5)
WBC: 4.4 10*3/uL (ref 4.0–10.5)

## 2015-06-06 LAB — LIPID PANEL
CHOL/HDL RATIO: 3
Cholesterol: 178 mg/dL (ref 0–200)
HDL: 59.4 mg/dL (ref >39.00)
LDL Cholesterol: 99 mg/dL (ref 0–99)
NonHDL: 118.84
TRIGLYCERIDES: 100 mg/dL (ref 0.0–149.0)
VLDL: 20 mg/dL (ref 0.0–40.0)

## 2015-06-06 LAB — BASIC METABOLIC PANEL
BUN: 15 mg/dL (ref 6–23)
CHLORIDE: 103 meq/L (ref 96–112)
CO2: 30 meq/L (ref 19–32)
CREATININE: 0.69 mg/dL (ref 0.40–1.20)
Calcium: 9.7 mg/dL (ref 8.4–10.5)
GFR: 94.3 mL/min (ref >60.00)
GLUCOSE: 84 mg/dL (ref 70–99)
Potassium: 4.4 mEq/L (ref 3.5–5.1)
Sodium: 140 mEq/L (ref 135–145)

## 2015-06-06 LAB — TSH: TSH: 1.44 u[IU]/mL (ref 0.35–4.50)

## 2015-06-08 ENCOUNTER — Other Ambulatory Visit: Payer: Self-pay | Admitting: Internal Medicine

## 2015-06-08 ENCOUNTER — Encounter: Payer: Self-pay | Admitting: Internal Medicine

## 2015-06-08 ENCOUNTER — Other Ambulatory Visit (INDEPENDENT_AMBULATORY_CARE_PROVIDER_SITE_OTHER): Payer: 59

## 2015-06-08 DIAGNOSIS — R7989 Other specified abnormal findings of blood chemistry: Secondary | ICD-10-CM

## 2015-06-08 DIAGNOSIS — R945 Abnormal results of liver function studies: Secondary | ICD-10-CM

## 2015-06-08 LAB — HEPATIC FUNCTION PANEL
ALT: 383 U/L — ABNORMAL HIGH (ref 0–35)
AST: 136 U/L — ABNORMAL HIGH (ref 0–37)
Albumin: 4.3 g/dL (ref 3.5–5.2)
Alkaline Phosphatase: 75 U/L (ref 39–117)
BILIRUBIN TOTAL: 1.5 mg/dL — AB (ref 0.2–1.2)
Bilirubin, Direct: 0.3 mg/dL (ref 0.0–0.3)
Total Protein: 6.5 g/dL (ref 6.0–8.3)

## 2015-06-08 NOTE — Progress Notes (Signed)
Order placed for labs.

## 2015-06-09 ENCOUNTER — Encounter: Payer: Self-pay | Admitting: Internal Medicine

## 2015-06-09 LAB — HEPATITIS C ANTIBODY: HCV Ab: NEGATIVE

## 2015-06-09 LAB — HEPATITIS B CORE ANTIBODY, IGM: HEP B C IGM: NONREACTIVE

## 2015-06-09 LAB — HEPATITIS B SURFACE ANTIGEN: Hepatitis B Surface Ag: NEGATIVE

## 2015-06-09 LAB — HEPATITIS B SURFACE ANTIBODY,QUALITATIVE: HEP B S AB: POSITIVE — AB

## 2015-06-09 LAB — HEPATITIS B CORE ANTIBODY, TOTAL: Hep B Core Total Ab: NONREACTIVE

## 2015-06-09 LAB — HEPATITIS A ANTIBODY, IGM: Hep A IgM: NONREACTIVE

## 2015-06-11 LAB — HEPATITIS B E ANTIGEN: HEPATITIS BE ANTIGEN: NONREACTIVE

## 2015-06-12 ENCOUNTER — Encounter: Payer: Self-pay | Admitting: Internal Medicine

## 2015-06-14 ENCOUNTER — Other Ambulatory Visit: Payer: 59

## 2015-06-21 ENCOUNTER — Other Ambulatory Visit: Payer: Self-pay | Admitting: Internal Medicine

## 2015-06-28 ENCOUNTER — Other Ambulatory Visit: Payer: 59

## 2015-06-29 ENCOUNTER — Telehealth: Payer: Self-pay | Admitting: *Deleted

## 2015-06-29 ENCOUNTER — Other Ambulatory Visit (INDEPENDENT_AMBULATORY_CARE_PROVIDER_SITE_OTHER): Payer: 59

## 2015-06-29 DIAGNOSIS — R945 Abnormal results of liver function studies: Secondary | ICD-10-CM

## 2015-06-29 DIAGNOSIS — R7989 Other specified abnormal findings of blood chemistry: Secondary | ICD-10-CM | POA: Diagnosis not present

## 2015-06-29 LAB — HEPATIC FUNCTION PANEL
ALT: 110 U/L — ABNORMAL HIGH (ref 0–35)
AST: 40 U/L — ABNORMAL HIGH (ref 0–37)
Albumin: 4.4 g/dL (ref 3.5–5.2)
Alkaline Phosphatase: 71 U/L (ref 39–117)
Bilirubin, Direct: 0.2 mg/dL (ref 0.0–0.3)
Total Bilirubin: 1.3 mg/dL — ABNORMAL HIGH (ref 0.2–1.2)
Total Protein: 6.8 g/dL (ref 6.0–8.3)

## 2015-06-29 NOTE — Telephone Encounter (Signed)
Labs and dx?  

## 2015-06-29 NOTE — Telephone Encounter (Signed)
Order placed for liver panel.  

## 2015-07-01 ENCOUNTER — Other Ambulatory Visit: Payer: Self-pay | Admitting: Internal Medicine

## 2015-07-01 ENCOUNTER — Encounter: Payer: Self-pay | Admitting: Internal Medicine

## 2015-07-01 DIAGNOSIS — R7989 Other specified abnormal findings of blood chemistry: Secondary | ICD-10-CM

## 2015-07-01 DIAGNOSIS — R945 Abnormal results of liver function studies: Secondary | ICD-10-CM

## 2015-07-01 NOTE — Progress Notes (Signed)
Order placed for f/u labs.  

## 2015-08-01 ENCOUNTER — Other Ambulatory Visit (INDEPENDENT_AMBULATORY_CARE_PROVIDER_SITE_OTHER): Payer: 59

## 2015-08-01 DIAGNOSIS — R7989 Other specified abnormal findings of blood chemistry: Secondary | ICD-10-CM | POA: Diagnosis not present

## 2015-08-01 DIAGNOSIS — R945 Abnormal results of liver function studies: Secondary | ICD-10-CM

## 2015-08-02 LAB — HEPATIC FUNCTION PANEL
ALBUMIN: 4.1 g/dL (ref 3.5–5.2)
ALT: 31 U/L (ref 0–35)
AST: 20 U/L (ref 0–37)
Alkaline Phosphatase: 59 U/L (ref 39–117)
Bilirubin, Direct: 0.2 mg/dL (ref 0.0–0.3)
Total Bilirubin: 1.1 mg/dL (ref 0.2–1.2)
Total Protein: 6.6 g/dL (ref 6.0–8.3)

## 2015-08-03 ENCOUNTER — Encounter: Payer: Self-pay | Admitting: Internal Medicine

## 2015-08-10 ENCOUNTER — Ambulatory Visit (INDEPENDENT_AMBULATORY_CARE_PROVIDER_SITE_OTHER): Payer: 59 | Admitting: Internal Medicine

## 2015-08-10 ENCOUNTER — Encounter: Payer: Self-pay | Admitting: Internal Medicine

## 2015-08-10 VITALS — BP 110/80 | HR 83 | Temp 98.4°F | Resp 18 | Ht 64.0 in | Wt 117.0 lb

## 2015-08-10 DIAGNOSIS — M545 Low back pain: Secondary | ICD-10-CM | POA: Diagnosis not present

## 2015-08-10 DIAGNOSIS — I10 Essential (primary) hypertension: Secondary | ICD-10-CM | POA: Diagnosis not present

## 2015-08-10 DIAGNOSIS — Z1211 Encounter for screening for malignant neoplasm of colon: Secondary | ICD-10-CM | POA: Diagnosis not present

## 2015-08-10 DIAGNOSIS — E7800 Pure hypercholesterolemia, unspecified: Secondary | ICD-10-CM

## 2015-08-10 MED ORDER — TELMISARTAN 20 MG PO TABS
20.0000 mg | ORAL_TABLET | Freq: Every day | ORAL | Status: DC
Start: 2015-08-10 — End: 2016-02-27

## 2015-08-10 MED ORDER — MELOXICAM 7.5 MG PO TABS: 7.5000 mg | ORAL_TABLET | Freq: Every day | ORAL | Status: DC | PRN

## 2015-08-10 NOTE — Progress Notes (Signed)
Pre-visit discussion using our clinic review tool. No additional management support is needed unless otherwise documented below in the visit note.  

## 2015-08-10 NOTE — Progress Notes (Signed)
[name], female   DOB: 1961-10-07, 54 y.o.   MRN: [id number]   Subjective:    [name], female    DOB: 09-17-1961, 54 y.o.   MRN: [id number]  HPI  Patient with past history of reoccurring sinus infections, frequent uti, hypertension and recent abnormal liver function tests.  She comes in today to follow up on these issues as well as for her complete physical exam.  She has been having bilateral hip and leg pain.  Was taking diclofenac.  Liver function tests increased.  Stopped diclofenac.  Liver function tests returned to normal.  She does feel she needs something.  Wants to try a low dose mobic and just take prn.  Will need to monitor liver function tests.  She has been doing yoga and exercise.  This helps.  No nausea or vomiting.  No bowel change.  No chest pain or tightness.  Breathing doing well.  Handling stress.  Blood pressure low on the 20mg  of micardis.  Elevates when off.     Past Medical History  Diagnosis Date  . Recurrent sinus infections   . Frequent UTI   . H/O lymphadenopathy     With previous submandibular node removals.  . Eczema   . Hypertension    Past Surgical History  Procedure Laterality Date  . Neck surgery  1977    H/O Right neck surgery secondary to a benign growth with when patient was 27 or 54 yo  . Lymph node removal  1984  . Uterine polyps removed  2004  . Skin lesion extraction  2013    basal cell - Dr Phillip Heal   Family History  Problem Relation Age of Onset  . Hyperlipidemia Mother   . Thyroid disease Mother   . Heart disease Mother     Heart Valve problems  . Coronary artery disease Father     Stents placed  . Hypertension      uncle  . Parkinson's disease Paternal Uncle   . Parkinson's disease Maternal Grandmother   . Heart disease Maternal Grandfather   . Heart disease Paternal Grandfather     MI   Social History   Social History  . Marital Status: Divorced    Spouse Name: N/A  . Number of  Children: 2  . Years of Education: N/A   Occupational History  .     Social History Main Topics  . Smoking status: Never Smoker   . Smokeless tobacco: Never Used  . Alcohol Use: 0.0 oz/week    0 Standard drinks or equivalent per week     Comment: Occasional  . Drug Use: No  . Sexual Activity: Not Asked   Other Topics Concern  . None   Social History Narrative    Outpatient Encounter Prescriptions as of 08/10/2015  Medication Sig  . Calcium Carbonate-Vitamin D (CALCIUM-VITAMIN D) 500-200 MG-UNIT per tablet Take 1 tablet by mouth daily.  . fish oil-omega-3 fatty acids 1000 MG capsule Take by mouth daily.  . fluocinonide (LIDEX) 0.05 % external solution APPLY TO AFFECTED AREA(S) AT BEDTIME  . fluocinonide gel (LIDEX) 7.82 % Apply 1 application topically 2 (two) times daily. Apply to affected area twice a day as needed.  . Multiple Vitamin (MULTIVITAMIN) tablet Take 1 tablet by mouth daily.  Marland Kitchen tretinoin (RETIN-A) 0.05 % cream Apply topically at bedtime. Apply to affected area three times a day as needed for Acne  . [DISCONTINUED] telmisartan (MICARDIS) 40 MG tablet  TAKE 1/2 TABLET BY MOUTH ONCE DAILY  . meloxicam (MOBIC) 7.5 MG tablet Take 1 tablet (7.5 mg total) by mouth daily as needed for pain.  Marland Kitchen telmisartan (MICARDIS) 20 MG tablet Take 1 tablet (20 mg total) by mouth daily.  . [DISCONTINUED] diclofenac (VOLTAREN) 75 MG EC tablet Take 75 mg by mouth 2 (two) times daily.   No facility-administered encounter medications on file as of 08/10/2015.    Review of Systems  Constitutional: Negative for appetite change and unexpected weight change.  HENT: Negative for congestion and sinus pressure.   Respiratory: Negative for cough, chest tightness and shortness of breath.   Cardiovascular: Negative for chest pain, palpitations and leg swelling.  Gastrointestinal: Negative for nausea, vomiting, abdominal pain and diarrhea.  Genitourinary: Negative for dysuria and difficulty  urinating.  Musculoskeletal:       Bilateral hip pain and leg pain as outlined.  Yoga helps.  Needs something for pain.  Wants to try low dose mobic.   Neurological: Negative for dizziness, light-headedness and headaches.  Psychiatric/Behavioral: Negative for dysphoric mood and agitation.       Objective:     Blood pressure rechecked by me:  120/78  Physical Exam  Constitutional: She appears well-developed and well-nourished. No distress.  HENT:  Nose: Nose normal.  Mouth/Throat: Oropharynx is clear and moist.  Eyes: Conjunctivae are normal. Right eye exhibits no discharge. Left eye exhibits no discharge.  Neck: Neck supple. No thyromegaly present.  Cardiovascular: Normal rate and regular rhythm.   Pulmonary/Chest: Breath sounds normal. No respiratory distress. She has no wheezes.  Abdominal: Soft. Bowel sounds are normal. There is no tenderness.  Musculoskeletal: She exhibits no edema or tenderness.  Lymphadenopathy:    She has no cervical adenopathy.  Skin: No rash noted. No erythema.  Psychiatric: She has a normal mood and affect. Her behavior is normal.    BP 110/80 mmHg  Pulse 83  Temp(Src) 98.4 F (36.9 C) (Oral)  Resp 18  Ht 5\' 4"  (1.626 m)  Wt 117 lb (53.071 kg)  BMI 20.07 kg/m2  SpO2 98%  LMP 12/01/2008 Wt Readings from Last 3 Encounters:  08/10/15 117 lb (53.071 kg)  05/29/15 118 lb 6.4 oz (53.706 kg)  08/08/14 114 lb 4 oz (51.823 kg)     Lab Results  Component Value Date   WBC 4.4 06/06/2015   HGB 12.9 06/06/2015   HCT 38.9 06/06/2015   PLT 287.0 06/06/2015   GLUCOSE 84 06/06/2015   CHOL 178 06/06/2015   TRIG 100.0 06/06/2015   HDL 59.40 06/06/2015   LDLDIRECT 148.9 08/15/2013   LDLCALC 99 06/06/2015   ALT 31 08/01/2015   AST 20 08/01/2015   NA 140 06/06/2015   K 4.4 06/06/2015   CL 103 06/06/2015   CREATININE 0.69 06/06/2015   BUN 15 06/06/2015   CO2 30 06/06/2015   TSH 1.44 06/06/2015    Mr Lumbar Spine Wo Contrast  03/09/2015   CLINICAL DATA:  Low back pain. Bilateral hip pain extending into both legs. EXAM: MRI LUMBAR SPINE WITHOUT CONTRAST TECHNIQUE: Multiplanar, multisequence MR imaging of the lumbar spine was performed. No intravenous contrast was administered. COMPARISON:  11/22/2010 FINDINGS: The lowest lumbar type non-rib-bearing vertebra is labeled as L5. The conus medullaris appears normal. Conus level: L1- 2. There is 7 mm anterolisthesis at L4-5 and 5 mm anterolisthesis at L5-S1 without pars defects identified. Disc desiccation noted at both of these levels. Thin lipoma of the filum terminale as shown on image 8  series 3. Additional findings at individual levels are as follows: L1-2:  No impingement.  Minimal disc bulge. L2-3:  Unremarkable. L3-4:  No impingement.  Mild disc bulge. L4-5: Prominent central narrowing of the thecal sac with moderate bilateral foraminal stenosis and moderate bilateral subarticular lateral recess stenosis due to facet arthropathy, ligamentum flavum redundancy common disc uncovering, and underlying slight disc bulge. L5-S1: Mild right foraminal stenosis due to facet arthropathy and disc uncovering. IMPRESSION: 1. Lumbar spondylosis and degenerative disc disease along with degenerative subluxations at L4-5 and L5-S1 contributing to prominent impingement at L4-5 and mild impingement at L5-S 1, as detailed above. Electronically Signed   By: Van Clines M.D.   On: 03/09/2015 16:24       Assessment & Plan:   Problem List Items Addressed This Visit    Back pain    Back, hip and leg pain as outlined.  Continue exercise and yoga.  Off diclofenac.  Feels needs something.  Mobic 7.5mg  q day prn.  Use least amount possible.  Will have to monitor liver function tests.        Relevant Medications   meloxicam (MOBIC) 7.5 MG tablet   Colon cancer screening    Will notify me when agreeable for colonoscopy.        Essential hypertension, benign - Primary    Blood pressure as outlined.  Will  adjust medication.  Change to 20mg  micardis and have her take 1/2 tablet q day.  Follow pressures.  Follow metabolic panel.        Relevant Medications   telmisartan (MICARDIS) 20 MG tablet   Hypercholesterolemia    Low cholesterol diet and exercise.  Follow lipid panel.        Relevant Medications   telmisartan (MICARDIS) 20 MG tablet       Einar Pheasant, MD

## 2015-08-11 ENCOUNTER — Encounter: Payer: Self-pay | Admitting: Internal Medicine

## 2015-08-11 DIAGNOSIS — Z1211 Encounter for screening for malignant neoplasm of colon: Secondary | ICD-10-CM | POA: Insufficient documentation

## 2015-08-11 NOTE — Assessment & Plan Note (Signed)
Will notify me when agreeable for colonoscopy.

## 2015-08-11 NOTE — Assessment & Plan Note (Signed)
Low cholesterol diet and exercise.  Follow lipid panel.   

## 2015-08-11 NOTE — Assessment & Plan Note (Signed)
Back, hip and leg pain as outlined.  Continue exercise and yoga.  Off diclofenac.  Feels needs something.  Mobic 7.5mg  q day prn.  Use least amount possible.  Will have to monitor liver function tests.

## 2015-08-11 NOTE — Assessment & Plan Note (Signed)
Blood pressure as outlined.  Will adjust medication.  Change to 20mg  micardis and have her take 1/2 tablet q day.  Follow pressures.  Follow metabolic panel.

## 2015-10-18 DIAGNOSIS — Z1283 Encounter for screening for malignant neoplasm of skin: Secondary | ICD-10-CM | POA: Diagnosis not present

## 2015-10-18 DIAGNOSIS — D229 Melanocytic nevi, unspecified: Secondary | ICD-10-CM | POA: Diagnosis not present

## 2015-10-18 DIAGNOSIS — Z872 Personal history of diseases of the skin and subcutaneous tissue: Secondary | ICD-10-CM | POA: Diagnosis not present

## 2015-11-13 ENCOUNTER — Encounter: Payer: Self-pay | Admitting: Internal Medicine

## 2015-11-13 ENCOUNTER — Ambulatory Visit (INDEPENDENT_AMBULATORY_CARE_PROVIDER_SITE_OTHER): Payer: 59 | Admitting: Internal Medicine

## 2015-11-13 VITALS — BP 110/80 | HR 88 | Temp 98.2°F | Resp 18 | Ht 64.0 in | Wt 119.0 lb

## 2015-11-13 DIAGNOSIS — I10 Essential (primary) hypertension: Secondary | ICD-10-CM

## 2015-11-13 DIAGNOSIS — E7800 Pure hypercholesterolemia, unspecified: Secondary | ICD-10-CM | POA: Diagnosis not present

## 2015-11-13 DIAGNOSIS — Z1239 Encounter for other screening for malignant neoplasm of breast: Secondary | ICD-10-CM | POA: Diagnosis not present

## 2015-11-13 DIAGNOSIS — M545 Low back pain: Secondary | ICD-10-CM

## 2015-11-13 NOTE — Progress Notes (Signed)
Pre-visit discussion using our clinic review tool. No additional management support is needed unless otherwise documented below in the visit note.  

## 2015-11-13 NOTE — Progress Notes (Signed)
[name], female   DOB: 05/26/1961, 55 y.o.   MRN: [id number]   Subjective:    [name], female    DOB: [birth year], 55 y.o.   MRN: [id number]  HPI  Patient with past history of hypertension, neck and back pain and hypercholesterolemia.  She comes in today to follow up on these issues.  She is taking 1/2 of micardis.  Blood pressure doing well.  Tries to stay active.  No cardiac symptoms with increased activity or exertion.  No sob.  No acid reflux.  No abdominal pain or cramping.  Bowels stable.  Still pain in her back.  Stable.  Desires no further intervention.  Off antiinflammatories.     Past Medical History  Diagnosis Date  . Recurrent sinus infections   . Frequent UTI   . H/O lymphadenopathy     With previous submandibular node removals.  . Eczema   . Hypertension    Past Surgical History  Procedure Laterality Date  . Neck surgery  1977    H/O Right neck surgery secondary to a benign growth with when patient was 10 or 55 yo  . Lymph node removal  1984  . Uterine polyps removed  2004  . Skin lesion extraction  2013    basal cell - Dr Phillip Heal   Family History  Problem Relation Age of Onset  . Hyperlipidemia Mother   . Thyroid disease Mother   . Heart disease Mother     Heart Valve problems  . Coronary artery disease Father     Stents placed  . Hypertension      uncle  . Parkinson's disease Paternal Uncle   . Parkinson's disease Maternal Grandmother   . Heart disease Maternal Grandfather   . Heart disease Paternal Grandfather     MI   Social History   Social History  . Marital Status: Divorced    Spouse Name: N/A  . Number of Children: 2  . Years of Education: N/A   Occupational History  .     Social History Main Topics  . Smoking status: Never Smoker   . Smokeless tobacco: Never Used  . Alcohol Use: 0.0 oz/week    0 Standard drinks or equivalent per week     Comment: Occasional  . Drug Use: No  . Sexual  Activity: Not Asked   Other Topics Concern  . None   Social History Narrative    Outpatient Encounter Prescriptions as of 11/13/2015  Medication Sig  . Calcium Carbonate-Vitamin D (CALCIUM-VITAMIN D) 500-200 MG-UNIT per tablet Take 1 tablet by mouth daily.  . fish oil-omega-3 fatty acids 1000 MG capsule Take by mouth daily.  . fluocinonide (LIDEX) 0.05 % external solution APPLY TO AFFECTED AREA(S) AT BEDTIME  . fluocinonide gel (LIDEX) AB-123456789 % Apply 1 application topically 2 (two) times daily. Apply to affected area twice a day as needed.  . meloxicam (MOBIC) 7.5 MG tablet Take 1 tablet (7.5 mg total) by mouth daily as needed for pain.  . Multiple Vitamin (MULTIVITAMIN) tablet Take 1 tablet by mouth daily.  Marland Kitchen telmisartan (MICARDIS) 20 MG tablet Take 1 tablet (20 mg total) by mouth daily.  Marland Kitchen tretinoin (RETIN-A) 0.05 % cream Apply topically at bedtime. Apply to affected area three times a day as needed for Acne   No facility-administered encounter medications on file as of 11/13/2015.    Review of Systems  Constitutional: Negative for appetite change and unexpected weight change.  HENT:  Negative for congestion and sinus pressure.   Respiratory: Negative for cough, chest tightness and shortness of breath.   Cardiovascular: Negative for chest pain, palpitations and leg swelling.  Gastrointestinal: Negative for nausea, vomiting, abdominal pain and diarrhea.  Genitourinary: Negative for dysuria and difficulty urinating.  Musculoskeletal: Positive for back pain (chronic back pain.  stable. ). Negative for joint swelling.  Skin: Negative for color change and rash.  Neurological: Negative for dizziness, light-headedness and headaches.  Psychiatric/Behavioral: Negative for dysphoric mood and agitation.       Objective:    Physical Exam  Constitutional: She appears well-developed and well-nourished. No distress.  HENT:  Nose: Nose normal.  Mouth/Throat: Oropharynx is clear and moist.    Eyes: Conjunctivae are normal. Right eye exhibits no discharge. Left eye exhibits no discharge.  Neck: Neck supple. No thyromegaly present.  Cardiovascular: Normal rate and regular rhythm.   Pulmonary/Chest: Breath sounds normal. No respiratory distress. She has no wheezes.  Abdominal: Soft. Bowel sounds are normal. There is no tenderness.  Musculoskeletal: She exhibits no edema or tenderness.  Lymphadenopathy:    She has no cervical adenopathy.  Skin: No rash noted. No erythema.  Psychiatric: She has a normal mood and affect. Her behavior is normal.    BP 110/80 mmHg  Pulse 88  Temp(Src) 98.2 F (36.8 C) (Oral)  Resp 18  Ht 5\' 4"  (1.626 m)  Wt 119 lb (53.978 kg)  BMI 20.42 kg/m2  SpO2 97%  LMP 12/01/2008 Wt Readings from Last 3 Encounters:  11/13/15 119 lb (53.978 kg)  08/10/15 117 lb (53.071 kg)  05/29/15 118 lb 6.4 oz (53.706 kg)     Lab Results  Component Value Date   WBC 4.4 06/06/2015   HGB 12.9 06/06/2015   HCT 38.9 06/06/2015   PLT 287.0 06/06/2015   GLUCOSE 84 06/06/2015   CHOL 178 06/06/2015   TRIG 100.0 06/06/2015   HDL 59.40 06/06/2015   LDLDIRECT 148.9 08/15/2013   LDLCALC 99 06/06/2015   ALT 31 08/01/2015   AST 20 08/01/2015   NA 140 06/06/2015   K 4.4 06/06/2015   CL 103 06/06/2015   CREATININE 0.69 06/06/2015   BUN 15 06/06/2015   CO2 30 06/06/2015   TSH 1.44 06/06/2015    Mr Lumbar Spine Wo Contrast  03/09/2015  CLINICAL DATA:  Low back pain. Bilateral hip pain extending into both legs. EXAM: MRI LUMBAR SPINE WITHOUT CONTRAST TECHNIQUE: Multiplanar, multisequence MR imaging of the lumbar spine was performed. No intravenous contrast was administered. COMPARISON:  11/22/2010 FINDINGS: The lowest lumbar type non-rib-bearing vertebra is labeled as L5. The conus medullaris appears normal. Conus level: L1- 2. There is 7 mm anterolisthesis at L4-5 and 5 mm anterolisthesis at L5-S1 without pars defects identified. Disc desiccation noted at both of these  levels. Thin lipoma of the filum terminale as shown on image 8 series 3. Additional findings at individual levels are as follows: L1-2:  No impingement.  Minimal disc bulge. L2-3:  Unremarkable. L3-4:  No impingement.  Mild disc bulge. L4-5: Prominent central narrowing of the thecal sac with moderate bilateral foraminal stenosis and moderate bilateral subarticular lateral recess stenosis due to facet arthropathy, ligamentum flavum redundancy common disc uncovering, and underlying slight disc bulge. L5-S1: Mild right foraminal stenosis due to facet arthropathy and disc uncovering. IMPRESSION: 1. Lumbar spondylosis and degenerative disc disease along with degenerative subluxations at L4-5 and L5-S1 contributing to prominent impingement at L4-5 and mild impingement at L5-S 1, as detailed above. Electronically  Signed   By: Van Clines M.D.   On: 03/09/2015 16:24       Assessment & Plan:   Problem List Items Addressed This Visit    Back pain    Off meloxicam.  Stable.  Continue yoga and exercise.  Follow.  Last liver function tests wnl.        Essential hypertension, benign    Blood pressure has been doing well on 1/2 micardis.  Follow.  Follow metabolic panel.        Hypercholesterolemia    Low cholesterol diet and exercise.  Follow lipoid panel.         Other Visit Diagnoses    Screening breast examination    -  Primary    Relevant Orders    MM DIGITAL SCREENING BILATERAL        Einar Pheasant, MD

## 2015-11-18 ENCOUNTER — Encounter: Payer: Self-pay | Admitting: Internal Medicine

## 2015-11-18 NOTE — Assessment & Plan Note (Signed)
Blood pressure has been doing well on 1/2 micardis.  Follow.  Follow metabolic panel.

## 2015-11-18 NOTE — Assessment & Plan Note (Signed)
Off meloxicam.  Stable.  Continue yoga and exercise.  Follow.  Last liver function tests wnl.

## 2015-11-18 NOTE — Assessment & Plan Note (Signed)
Low cholesterol diet and exercise.  Follow lipoid panel.

## 2015-12-03 ENCOUNTER — Ambulatory Visit: Payer: 59

## 2015-12-04 ENCOUNTER — Ambulatory Visit
Admission: RE | Admit: 2015-12-04 | Discharge: 2015-12-04 | Disposition: A | Discharge status: Home / Self Care | Payer: 59 | Source: Ambulatory Visit | Attending: Internal Medicine | Admitting: Internal Medicine

## 2015-12-04 ENCOUNTER — Other Ambulatory Visit: Payer: Self-pay | Admitting: Internal Medicine

## 2015-12-04 DIAGNOSIS — Z1231 Encounter for screening mammogram for malignant neoplasm of breast: Secondary | ICD-10-CM | POA: Diagnosis not present

## 2015-12-04 DIAGNOSIS — Z1239 Encounter for other screening for malignant neoplasm of breast: Secondary | ICD-10-CM

## 2016-02-04 DIAGNOSIS — H5213 Myopia, bilateral: Secondary | ICD-10-CM | POA: Diagnosis not present

## 2016-02-27 ENCOUNTER — Other Ambulatory Visit: Payer: Self-pay | Admitting: Internal Medicine

## 2016-03-05 ENCOUNTER — Ambulatory Visit: Payer: Self-pay | Admitting: Physician Assistant

## 2016-03-05 ENCOUNTER — Encounter: Payer: Self-pay | Admitting: Physician Assistant

## 2016-03-05 VITALS — BP 100/70 | HR 76 | Temp 98.3°F

## 2016-03-05 DIAGNOSIS — H6991 Unspecified Eustachian tube disorder, right ear: Secondary | ICD-10-CM

## 2016-03-05 DIAGNOSIS — H6981 Other specified disorders of Eustachian tube, right ear: Secondary | ICD-10-CM

## 2016-03-05 MED ORDER — PREDNISONE 10 MG PO TABS: 30.0000 mg | ORAL_TABLET | Freq: Every day | ORAL | Status: DC

## 2016-03-05 MED ORDER — FLUTICASONE PROPIONATE 50 MCG/ACT NA SUSP: 2.0000 | Freq: Every day | NASAL | Status: DC

## 2016-03-05 NOTE — Progress Notes (Signed)
S:  C/o ears popping and being stopped up, no drainage from ears, no fever/chills, no cough or congestion, some sinus pressure, remainder ros neg, can't hear as well out of r ear Using otc meds without relief  O:  Vitals wnl, nad, tms dull b/l, nasal mucosa swollen, throat wnl, neck supple no lymph, lungs c t a, cv rrr, neuro intact  A: acute eustachean tube dysfunction  P: flonase, prednisone 30mg  qd x 3d, return if not improving in 3 to 5 days, return earlier if worsening

## 2016-03-14 ENCOUNTER — Ambulatory Visit (INDEPENDENT_AMBULATORY_CARE_PROVIDER_SITE_OTHER): Payer: 59 | Admitting: Internal Medicine

## 2016-03-14 ENCOUNTER — Encounter: Payer: Self-pay | Admitting: Internal Medicine

## 2016-03-14 VITALS — BP 124/80 | HR 74 | Temp 98.5°F | Wt 123.0 lb

## 2016-03-14 DIAGNOSIS — H938X1 Other specified disorders of right ear: Secondary | ICD-10-CM

## 2016-03-14 DIAGNOSIS — E7800 Pure hypercholesterolemia, unspecified: Secondary | ICD-10-CM

## 2016-03-14 DIAGNOSIS — M542 Cervicalgia: Secondary | ICD-10-CM

## 2016-03-14 DIAGNOSIS — I10 Essential (primary) hypertension: Secondary | ICD-10-CM

## 2016-03-14 DIAGNOSIS — M545 Low back pain: Secondary | ICD-10-CM

## 2016-03-14 DIAGNOSIS — H6981 Other specified disorders of Eustachian tube, right ear: Secondary | ICD-10-CM

## 2016-03-14 MED ORDER — FLUTICASONE PROPIONATE 50 MCG/ACT NA SUSP: 2.0000 | Freq: Every day | NASAL | Status: DC

## 2016-03-14 NOTE — Progress Notes (Signed)
Pre visit review using our clinic review tool, if applicable. No additional management support is needed unless otherwise documented below in the visit note. 

## 2016-03-14 NOTE — Progress Notes (Signed)
[name], female   DOB: 1961/01/23, 55 y.o.   MRN: [id number]   Subjective:    [name], female    DOB: 1961-04-28, 55 y.o.   MRN: [id number]  HPI  Patient here for a scheduled follow up.  She is doing her stretches and exercises.  Neck pain and back pain - stable.  She desires no further intervention.  Breathing stable.  She does report she has noticed her right ear closing or feeling muffled at times.  Was seen at acute care.  Was prescribed flonase and prednisone.  Has not taken prednisone.  Has used flonase with some noted improvement.  Still some intermittent fullness.  No sinus pressure.  No chest congestion.  No sob.  No acid reflux.  No abdominal pain or cramping.  Bowels stable.  Handling stress.    Past Medical History  Diagnosis Date  . Recurrent sinus infections   . Frequent UTI   . H/O lymphadenopathy     With previous submandibular node removals.  . Eczema   . Hypertension    Past Surgical History  Procedure Laterality Date  . Neck surgery  1977    H/O Right neck surgery secondary to a benign growth with when patient was 70 or 55 yo  . Lymph node removal  1984  . Uterine polyps removed  2004  . Skin lesion extraction  2013    basal cell - Dr Phillip Heal   Family History  Problem Relation Age of Onset  . Hyperlipidemia Mother   . Thyroid disease Mother   . Heart disease Mother     Heart Valve problems  . Coronary artery disease Father     Stents placed  . Hypertension      uncle  . Parkinson's disease Paternal Uncle   . Parkinson's disease Maternal Grandmother   . Heart disease Maternal Grandfather   . Heart disease Paternal Grandfather     MI  . Breast cancer Neg Hx    Social History   Social History  . Marital Status: Divorced    Spouse Name: N/A  . Number of Children: 2  . Years of Education: N/A   Occupational History  .     Social History Main Topics  . Smoking status: Never Smoker   . Smokeless  tobacco: Never Used  . Alcohol Use: 0.0 oz/week    0 Standard drinks or equivalent per week     Comment: Occasional  . Drug Use: No  . Sexual Activity: Not Asked   Other Topics Concern  . None   Social History Narrative    Outpatient Encounter Prescriptions as of 03/14/2016  Medication Sig  . Calcium Carbonate-Vitamin D (CALCIUM-VITAMIN D) 500-200 MG-UNIT per tablet Take 1 tablet by mouth daily.  . fish oil-omega-3 fatty acids 1000 MG capsule Take by mouth daily.  . fluocinonide (LIDEX) 0.05 % external solution APPLY TO AFFECTED AREA(S) AT BEDTIME  . fluocinonide gel (LIDEX) AB-123456789 % Apply 1 application topically 2 (two) times daily. Apply to affected area twice a day as needed.  . fluticasone (FLONASE) 50 MCG/ACT nasal spray Place 2 sprays into both nostrils daily.  . meloxicam (MOBIC) 7.5 MG tablet Take 1 tablet (7.5 mg total) by mouth daily as needed for pain.  . Multiple Vitamin (MULTIVITAMIN) tablet Take 1 tablet by mouth daily.  Marland Kitchen telmisartan (MICARDIS) 20 MG tablet TAKE 1 TABLET BY MOUTH DAILY.  Marland Kitchen tretinoin (RETIN-A) 0.05 % cream Apply topically at  bedtime. Apply to affected area three times a day as needed for Acne  . [DISCONTINUED] fluticasone (FLONASE) 50 MCG/ACT nasal spray Place 2 sprays into both nostrils daily.  . [DISCONTINUED] predniSONE (DELTASONE) 10 MG tablet Take 3 tablets (30 mg total) by mouth daily with breakfast.   No facility-administered encounter medications on file as of 03/14/2016.    Review of Systems  Constitutional: Negative for appetite change and unexpected weight change.  HENT: Negative for congestion and sinus pressure.        Right ear fullness.  Muffled sensation.   Eyes: Negative for discharge and itching.  Respiratory: Negative for cough, chest tightness and shortness of breath.   Cardiovascular: Negative for chest pain, palpitations and leg swelling.  Gastrointestinal: Negative for nausea, vomiting, abdominal pain and diarrhea.  Genitourinary:  Negative for dysuria and difficulty urinating.  Musculoskeletal: Positive for neck pain. Negative for joint swelling.  Skin: Negative for color change and rash.  Neurological: Negative for dizziness, light-headedness and headaches.  Psychiatric/Behavioral: Negative for dysphoric mood and agitation.       Objective:    Physical Exam  Constitutional: She appears well-developed and well-nourished. No distress.  HENT:  Nose: Nose normal.  Mouth/Throat: Oropharynx is clear and moist.  Neck: Neck supple. No thyromegaly present.  Cardiovascular: Normal rate and regular rhythm.   Pulmonary/Chest: Breath sounds normal. No respiratory distress. She has no wheezes.  Abdominal: Soft. Bowel sounds are normal. There is no tenderness.  Musculoskeletal: She exhibits no edema or tenderness.  Lymphadenopathy:    She has no cervical adenopathy.  Skin: No rash noted. No erythema.  Psychiatric: She has a normal mood and affect. Her behavior is normal.    BP 124/80 mmHg  Pulse 74  Temp(Src) 98.5 F (36.9 C) (Oral)  Wt 123 lb (55.792 kg)  LMP 12/01/2008 Wt Readings from Last 3 Encounters:  03/14/16 123 lb (55.792 kg)  11/13/15 119 lb (53.978 kg)  08/10/15 117 lb (53.071 kg)     Lab Results  Component Value Date   WBC 4.4 06/06/2015   HGB 12.9 06/06/2015   HCT 38.9 06/06/2015   PLT 287.0 06/06/2015   GLUCOSE 84 06/06/2015   CHOL 178 06/06/2015   TRIG 100.0 06/06/2015   HDL 59.40 06/06/2015   LDLDIRECT 148.9 08/15/2013   LDLCALC 99 06/06/2015   ALT 31 08/01/2015   AST 20 08/01/2015   NA 140 06/06/2015   K 4.4 06/06/2015   CL 103 06/06/2015   CREATININE 0.69 06/06/2015   BUN 15 06/06/2015   CO2 30 06/06/2015   TSH 1.44 06/06/2015    Mm Screening Breast Tomo Bilateral  12/05/2015  CLINICAL DATA:  Screening. EXAM: DIGITAL SCREENING BILATERAL MAMMOGRAM WITH 3D TOMO WITH CAD COMPARISON:  Previous exam(s). ACR Breast Density Category b: There are scattered areas of fibroglandular  density. FINDINGS: There are no findings suspicious for malignancy. Images were processed with CAD. IMPRESSION: No mammographic evidence of malignancy. A result letter of this screening mammogram will be mailed directly to the patient. RECOMMENDATION: Screening mammogram in one year. (Code:SM-B-01Y) BI-RADS CATEGORY  1: Negative. Electronically Signed   By: Franki Cabot M.D.   On: 12/05/2015 08:27       Assessment & Plan:   Problem List Items Addressed This Visit    Back pain    Continue exercise.  Stable.  Follow.       Essential hypertension, benign    Blood pressure under good control.  Continue same medication regimen.  Follow pressures.  Follow metabolic panel.        Relevant Orders   CBC with Differential/Platelet   TSH   Basic metabolic panel   Hypercholesterolemia    Last cholesterol diet and exercise.  Last lipid panel wnl.  Will follow.       Relevant Orders   Lipid panel   Hepatic function panel   Neck pain    Stable.        Sensation of fullness in right ear    Continue flonase.  Can try afrin as directed.  Follow.  Notify me if persistent.       Other Visit Diagnoses    Dysfunction of eustachian tube, right    -  Primary    Relevant Medications    fluticasone (FLONASE) 50 MCG/ACT nasal spray        Einar Pheasant, MD

## 2016-03-14 NOTE — Patient Instructions (Signed)
Take a probiotic daily.  (align, florastor or phillips colon health)

## 2016-03-15 ENCOUNTER — Encounter: Payer: Self-pay | Admitting: Internal Medicine

## 2016-03-15 DIAGNOSIS — H938X1 Other specified disorders of right ear: Secondary | ICD-10-CM | POA: Insufficient documentation

## 2016-03-15 NOTE — Assessment & Plan Note (Signed)
Stable

## 2016-03-15 NOTE — Assessment & Plan Note (Signed)
Continue flonase.  Can try afrin as directed.  Follow.  Notify me if persistent.

## 2016-03-15 NOTE — Assessment & Plan Note (Signed)
Blood pressure under good control.  Continue same medication regimen.  Follow pressures.  Follow metabolic panel.   

## 2016-03-15 NOTE — Assessment & Plan Note (Signed)
Last cholesterol diet and exercise.  Last lipid panel wnl.  Will follow.

## 2016-03-15 NOTE — Assessment & Plan Note (Signed)
Continue exercise.  Stable.  Follow.

## 2016-03-25 ENCOUNTER — Encounter: Payer: Self-pay | Admitting: Internal Medicine

## 2016-03-28 NOTE — Telephone Encounter (Signed)
Unread mychart message & lab appt mailed to patient 

## 2016-05-07 ENCOUNTER — Other Ambulatory Visit (INDEPENDENT_AMBULATORY_CARE_PROVIDER_SITE_OTHER): Payer: 59

## 2016-05-07 DIAGNOSIS — E7800 Pure hypercholesterolemia, unspecified: Secondary | ICD-10-CM

## 2016-05-07 DIAGNOSIS — I10 Essential (primary) hypertension: Secondary | ICD-10-CM

## 2016-05-07 LAB — CBC WITH DIFFERENTIAL/PLATELET
BASOS PCT: 0.4 % (ref 0.0–3.0)
Basophils Absolute: 0 10*3/uL (ref 0.0–0.1)
EOS PCT: 1.2 % (ref 0.0–5.0)
Eosinophils Absolute: 0.1 10*3/uL (ref 0.0–0.7)
HCT: 40.2 % (ref 36.0–46.0)
Hemoglobin: 13.5 g/dL (ref 12.0–15.0)
LYMPHS ABS: 1.5 10*3/uL (ref 0.7–4.0)
Lymphocytes Relative: 33.4 % (ref 12.0–46.0)
MCHC: 33.6 g/dL (ref 30.0–36.0)
MCV: 95.4 fl (ref 78.0–100.0)
MONO ABS: 0.3 10*3/uL (ref 0.1–1.0)
Monocytes Relative: 7.5 % (ref 3.0–12.0)
NEUTROS PCT: 57.5 % (ref 43.0–77.0)
Neutro Abs: 2.5 10*3/uL (ref 1.4–7.7)
Platelets: 286 10*3/uL (ref 150.0–400.0)
RBC: 4.21 Mil/uL (ref 3.87–5.11)
RDW: 13.3 % (ref 11.5–15.5)
WBC: 4.4 10*3/uL (ref 4.0–10.5)

## 2016-05-07 LAB — LIPID PANEL
Cholesterol: 217 mg/dL — ABNORMAL HIGH (ref 0–200)
HDL: 61.7 mg/dL (ref >39.00)
LDL Cholesterol: 137 mg/dL — ABNORMAL HIGH (ref 0–99)
NONHDL: 155.74
Total CHOL/HDL Ratio: 4
Triglycerides: 95 mg/dL (ref 0.0–149.0)
VLDL: 19 mg/dL (ref 0.0–40.0)

## 2016-05-07 LAB — HEPATIC FUNCTION PANEL
ALK PHOS: 58 U/L (ref 39–117)
ALT: 16 U/L (ref 0–35)
AST: 13 U/L (ref 0–37)
Albumin: 4.5 g/dL (ref 3.5–5.2)
BILIRUBIN TOTAL: 1.4 mg/dL — AB (ref 0.2–1.2)
Bilirubin, Direct: 0.2 mg/dL (ref 0.0–0.3)
Total Protein: 7 g/dL (ref 6.0–8.3)

## 2016-05-07 LAB — BASIC METABOLIC PANEL
BUN: 18 mg/dL (ref 6–23)
CALCIUM: 9.7 mg/dL (ref 8.4–10.5)
CO2: 32 meq/L (ref 19–32)
CREATININE: 0.8 mg/dL (ref 0.40–1.20)
Chloride: 103 mEq/L (ref 96–112)
GFR: 79.23 mL/min (ref >60.00)
GLUCOSE: 84 mg/dL (ref 70–99)
Potassium: 4 mEq/L (ref 3.5–5.1)
Sodium: 141 mEq/L (ref 135–145)

## 2016-05-07 LAB — TSH: TSH: 1.63 u[IU]/mL (ref 0.35–4.50)

## 2016-05-08 ENCOUNTER — Encounter: Payer: Self-pay | Admitting: Internal Medicine

## 2016-06-20 ENCOUNTER — Other Ambulatory Visit: Payer: Self-pay | Admitting: Internal Medicine

## 2016-07-11 ENCOUNTER — Ambulatory Visit: Payer: Self-pay | Admitting: Physician Assistant

## 2016-07-11 ENCOUNTER — Encounter: Payer: Self-pay | Admitting: Physician Assistant

## 2016-07-11 VITALS — BP 130/80 | HR 76 | Temp 98.0°F

## 2016-07-11 DIAGNOSIS — H6991 Unspecified Eustachian tube disorder, right ear: Secondary | ICD-10-CM

## 2016-07-11 DIAGNOSIS — H6981 Other specified disorders of Eustachian tube, right ear: Secondary | ICD-10-CM

## 2016-07-11 MED ORDER — TRIAMCINOLONE ACETONIDE 55 MCG/ACT NA AERO: 2.0000 | INHALATION_SPRAY | Freq: Every day | NASAL | 12 refills | Status: DC

## 2016-07-11 NOTE — Progress Notes (Signed)
S:  C/o ears popping and being stopped up, no drainage from ears, no fever/chills, no cough or congestion, some sinus pressure, remainder ros neg, sx for 4 months, was seen in may and never really got better, ? If could try nasacort instead of flonase, also states if she takes sudafed its better, has high blood pressure so doesn't feel she should stay on sudafed Using otc meds without relief  O:  Vitals wnl, nad, tms dull b/l, nasal mucosa swollen, throat wnl, neck supple no lymph, lungs c t a, cv rrr, neuro intact  A: acute eustachean tube dysfunction  P: nasacort sudafed,return if not improving in 3 to 5 days, return earlier if worsening, pt to call ENT and make appointment

## 2016-07-25 DIAGNOSIS — H6060 Unspecified chronic otitis externa, unspecified ear: Secondary | ICD-10-CM | POA: Diagnosis not present

## 2016-07-25 DIAGNOSIS — H6983 Other specified disorders of Eustachian tube, bilateral: Secondary | ICD-10-CM | POA: Diagnosis not present

## 2016-07-25 DIAGNOSIS — H9041 Sensorineural hearing loss, unilateral, right ear, with unrestricted hearing on the contralateral side: Secondary | ICD-10-CM | POA: Diagnosis not present

## 2016-07-25 DIAGNOSIS — J301 Allergic rhinitis due to pollen: Secondary | ICD-10-CM | POA: Diagnosis not present

## 2016-08-04 ENCOUNTER — Other Ambulatory Visit: Payer: Self-pay | Admitting: Internal Medicine

## 2016-08-12 ENCOUNTER — Other Ambulatory Visit: Payer: Self-pay | Admitting: Internal Medicine

## 2016-08-14 ENCOUNTER — Other Ambulatory Visit (HOSPITAL_COMMUNITY)
Admission: RE | Admit: 2016-08-14 | Discharge: 2016-08-14 | Disposition: A | Discharge status: Home / Self Care | Payer: 59 | Source: Ambulatory Visit | Attending: Internal Medicine | Admitting: Internal Medicine

## 2016-08-14 ENCOUNTER — Ambulatory Visit (INDEPENDENT_AMBULATORY_CARE_PROVIDER_SITE_OTHER): Payer: 59 | Admitting: Internal Medicine

## 2016-08-14 ENCOUNTER — Encounter: Payer: Self-pay | Admitting: Internal Medicine

## 2016-08-14 VITALS — BP 124/78 | HR 80 | Temp 98.7°F | Ht 64.0 in | Wt 120.2 lb

## 2016-08-14 DIAGNOSIS — Z0000 Encounter for general adult medical examination without abnormal findings: Secondary | ICD-10-CM

## 2016-08-14 DIAGNOSIS — I10 Essential (primary) hypertension: Secondary | ICD-10-CM

## 2016-08-14 DIAGNOSIS — M545 Low back pain, unspecified: Secondary | ICD-10-CM

## 2016-08-14 DIAGNOSIS — Z01419 Encounter for gynecological examination (general) (routine) without abnormal findings: Secondary | ICD-10-CM | POA: Insufficient documentation

## 2016-08-14 DIAGNOSIS — M542 Cervicalgia: Secondary | ICD-10-CM | POA: Diagnosis not present

## 2016-08-14 DIAGNOSIS — Z1151 Encounter for screening for human papillomavirus (HPV): Secondary | ICD-10-CM | POA: Diagnosis not present

## 2016-08-14 DIAGNOSIS — E7800 Pure hypercholesterolemia, unspecified: Secondary | ICD-10-CM | POA: Diagnosis not present

## 2016-08-14 DIAGNOSIS — H8109 Meniere's disease, unspecified ear: Secondary | ICD-10-CM

## 2016-08-14 DIAGNOSIS — Z124 Encounter for screening for malignant neoplasm of cervix: Secondary | ICD-10-CM | POA: Diagnosis not present

## 2016-08-14 MED ORDER — TELMISARTAN 20 MG PO TABS: 20.0000 mg | ORAL_TABLET | Freq: Every day | ORAL | 2 refills | Status: DC

## 2016-08-14 NOTE — Progress Notes (Signed)
Pre visit review using our clinic review tool, if applicable. No additional management support is needed unless otherwise documented below in the visit note. 

## 2016-08-14 NOTE — Progress Notes (Signed)
[name], female   DOB: 09-18-1961, 55 y.o.   MRN: [id number]   Subjective:    [name], female    DOB: 04-24-1961, 55 y.o.   MRN: [id number]  HPI  Patient with past history of hypertension and neck and back pain.  She comes in today to follow up on these issues as well as for a complete physical exam.  Seeing Dr Richardson Landry.  Had hearing evaluation.  Diagnosed with meneiere's disease.  Dizziness is better.  No headache.  No significant sinus issues.  She is having persistent pain in her neck and into her biceps.  She goes to the gym.  Massages help.  She has not been taking antiinflammatories.  mobic helped, but elevated LFTs.  She would like to start taking ibuprofen.  Feels needs something.  Tries to stay active.  No chest pain.  No sob.  No acid reflux.  No abdominal pain or cramping.  Bowels stable.     Past Medical History:  Diagnosis Date  . Eczema   . Frequent UTI   . H/O lymphadenopathy    With previous submandibular node removals.  . Hypertension   . Recurrent sinus infections    Past Surgical History:  Procedure Laterality Date  . Lymph Node Removal  1984  . NECK SURGERY  1977   H/O Right neck surgery secondary to a benign growth with when patient was 51 or 55 yo  . skin lesion extraction  2013   basal cell - Dr Phillip Heal  . Uterine Polyps Removed  2004   Family History  Problem Relation Age of Onset  . Hyperlipidemia Mother   . Thyroid disease Mother   . Heart disease Mother     Heart Valve problems  . Coronary artery disease Father     Stents placed  . Hypertension      uncle  . Parkinson's disease Paternal Uncle   . Parkinson's disease Maternal Grandmother   . Heart disease Maternal Grandfather   . Heart disease Paternal Grandfather     MI  . Breast cancer Neg Hx    Social History   Social History  . Marital status: Divorced    Spouse name: N/A  . Number of children: 2  . Years of education: N/A   Occupational  History  .  [employer]   Social History Main Topics  . Smoking status: Never Smoker  . Smokeless tobacco: Never Used  . Alcohol use 0.0 oz/week     Comment: Occasional  . Drug use: No  . Sexual activity: Not Asked   Other Topics Concern  . None   Social History Narrative  . None    Outpatient Encounter Prescriptions as of 08/14/2016  Medication Sig  . Calcium Carbonate-Vitamin D (CALCIUM-VITAMIN D) 500-200 MG-UNIT per tablet Take 1 tablet by mouth daily.  . fish oil-omega-3 fatty acids 1000 MG capsule Take by mouth daily.  . fluocinonide (LIDEX) 0.05 % external solution APPLY TO AFFECTED AREA(S) AT BEDTIME  . fluocinonide gel (LIDEX) AB-123456789 % Apply 1 application topically 2 (two) times daily. Apply to affected area twice a day as needed.  . fluticasone (FLONASE) 50 MCG/ACT nasal spray Place 2 sprays into both nostrils daily.  . Multiple Vitamin (MULTIVITAMIN) tablet Take 1 tablet by mouth daily.  Marland Kitchen telmisartan (MICARDIS) 20 MG tablet Take 1 tablet (20 mg total) by mouth daily.  Marland Kitchen tretinoin (RETIN-A) 0.05 % cream Apply topically at bedtime. Apply to affected area  three times a day as needed for Acne  . triamcinolone (NASACORT AQ) 55 MCG/ACT AERO nasal inhaler Place 2 sprays into the nose daily.  . [DISCONTINUED] meloxicam (MOBIC) 7.5 MG tablet Take 1 tablet (7.5 mg total) by mouth daily as needed for pain.  . [DISCONTINUED] telmisartan (MICARDIS) 20 MG tablet TAKE 1 TABLET BY MOUTH DAILY.   No facility-administered encounter medications on file as of 08/14/2016.     Review of Systems  Constitutional: Negative for appetite change and unexpected weight change.  HENT: Negative for congestion and sinus pressure.   Eyes: Negative for pain and visual disturbance.  Respiratory: Negative for cough, chest tightness and shortness of breath.   Cardiovascular: Negative for chest pain, palpitations and leg swelling.  Gastrointestinal: Negative for abdominal pain, diarrhea, nausea and vomiting.    Genitourinary: Negative for difficulty urinating and dysuria.  Musculoskeletal: Negative for joint swelling.       Neck, shoulder and upper arm pain as outlined.    Skin: Negative for color change and rash.  Neurological: Negative for dizziness, light-headedness and headaches.  Hematological: Negative for adenopathy. Does not bruise/bleed easily.  Psychiatric/Behavioral: Negative for agitation and dysphoric mood.       Objective:    Physical Exam  Constitutional: She is oriented to person, place, and time. She appears well-developed and well-nourished. No distress.  HENT:  Nose: Nose normal.  Mouth/Throat: Oropharynx is clear and moist.  Eyes: Right eye exhibits no discharge. Left eye exhibits no discharge. No scleral icterus.  Neck: Neck supple. No thyromegaly present.  Cardiovascular: Normal rate and regular rhythm.   Pulmonary/Chest: Breath sounds normal. No accessory muscle usage. No tachypnea. No respiratory distress. She has no decreased breath sounds. She has no wheezes. She has no rhonchi. Right breast exhibits no inverted nipple, no mass, no nipple discharge and no tenderness (no axillary adenopathy). Left breast exhibits no inverted nipple, no mass, no nipple discharge and no tenderness (no axilarry adenopathy).  Abdominal: Soft. Bowel sounds are normal. There is no tenderness.  Genitourinary:  Genitourinary Comments: Normal external genitalia.  Vaginal vault without lesions.  Cervix identified.  Pap smear performed.  Could not appreciate any adnexal masses or tenderness.    Musculoskeletal: She exhibits no edema or tenderness.  Lymphadenopathy:    She has no cervical adenopathy.  Neurological: She is alert and oriented to person, place, and time.  Skin: Skin is warm. No rash noted. No erythema.  Psychiatric: She has a normal mood and affect. Her behavior is normal.    BP 124/78   Pulse 80   Temp 98.7 F (37.1 C) (Oral)   Ht 5\' 4"  (1.626 m)   Wt 120 lb 3.2 oz (54.5  kg)   LMP 12/01/2008   SpO2 98%   BMI 20.63 kg/m  Wt Readings from Last 3 Encounters:  08/14/16 120 lb 3.2 oz (54.5 kg)  03/14/16 123 lb (55.8 kg)  11/13/15 119 lb (54 kg)     Lab Results  Component Value Date   WBC 4.4 05/07/2016   HGB 13.5 05/07/2016   HCT 40.2 05/07/2016   PLT 286.0 05/07/2016   GLUCOSE 84 05/07/2016   CHOL 217 (H) 05/07/2016   TRIG 95.0 05/07/2016   HDL 61.70 05/07/2016   LDLDIRECT 148.9 08/15/2013   LDLCALC 137 (H) 05/07/2016   ALT 16 05/07/2016   AST 13 05/07/2016   NA 141 05/07/2016   K 4.0 05/07/2016   CL 103 05/07/2016   CREATININE 0.80 05/07/2016   BUN  18 05/07/2016   CO2 32 05/07/2016   TSH 1.63 05/07/2016    Mm Screening Breast Tomo Bilateral  Result Date: 12/05/2015 CLINICAL DATA:  Screening. EXAM: DIGITAL SCREENING BILATERAL MAMMOGRAM WITH 3D TOMO WITH CAD COMPARISON:  Previous exam(s). ACR Breast Density Category b: There are scattered areas of fibroglandular density. FINDINGS: There are no findings suspicious for malignancy. Images were processed with CAD. IMPRESSION: No mammographic evidence of malignancy. A result letter of this screening mammogram will be mailed directly to the patient. RECOMMENDATION: Screening mammogram in one year. (Code:SM-B-01Y) BI-RADS CATEGORY  1: Negative. Electronically Signed   By: Franki Cabot M.D.   On: 12/05/2015 08:27       Assessment & Plan:   Problem List Items Addressed This Visit    Back pain    Exercises.  Gentle use of ibuprofen.  Follow.        Relevant Orders   T4, free   T3, free   TSH   Basic metabolic panel   Essential hypertension, benign    Blood pressure under good control.  Continue same medication regimen.  Follow pressures.  Follow metabolic panel.        Relevant Medications   telmisartan (MICARDIS) 20 MG tablet   Health care maintenance    Physical today 08/14/16.  PAP 08/14/16.  Mammogram 12/05/15 - [facility] I.  Discussed the need for colonoscopy.         Hypercholesterolemia    Low cholesterol diet and exercise.  Follow lipid panel.        Relevant Medications   telmisartan (MICARDIS) 20 MG tablet   Other Relevant Orders   Lipid panel   Hepatic function panel   Meniere's disease    Followed by Dr Richardson Landry.  Stable.       Neck pain    Pain as outlined.  Has been worked up previously.  Will start gentle use of ibuprofen.  Liver function tests elevated with mobic.  Desires no further intervention.  Follow.         Other Visit Diagnoses    Routine cervical smear    -  Primary   Relevant Orders   Cytology - PAP       Einar Pheasant, MD

## 2016-08-17 ENCOUNTER — Encounter: Payer: Self-pay | Admitting: Internal Medicine

## 2016-08-17 DIAGNOSIS — H8109 Meniere's disease, unspecified ear: Secondary | ICD-10-CM | POA: Insufficient documentation

## 2016-08-17 NOTE — Assessment & Plan Note (Signed)
Low cholesterol diet and exercise.  Follow lipid panel.   

## 2016-08-17 NOTE — Assessment & Plan Note (Signed)
Blood pressure under good control.  Continue same medication regimen.  Follow pressures.  Follow metabolic panel.   

## 2016-08-17 NOTE — Assessment & Plan Note (Addendum)
Physical today 08/14/16.  PAP 08/14/16.  Mammogram 12/05/15 - Birads I.  Discussed the need for colonoscopy.

## 2016-08-17 NOTE — Assessment & Plan Note (Signed)
Pain as outlined.  Has been worked up previously.  Will start gentle use of ibuprofen.  Liver function tests elevated with mobic.  Desires no further intervention.  Follow.

## 2016-08-17 NOTE — Assessment & Plan Note (Signed)
Exercises.  Gentle use of ibuprofen.  Follow.

## 2016-08-17 NOTE — Assessment & Plan Note (Signed)
Followed by Dr Richardson Landry.  Stable.

## 2016-08-18 LAB — CYTOLOGY - PAP
DIAGNOSIS: NEGATIVE
HPV (WINDOPATH): NOT DETECTED

## 2016-08-19 ENCOUNTER — Encounter: Payer: Self-pay | Admitting: Internal Medicine

## 2016-08-27 ENCOUNTER — Other Ambulatory Visit: Payer: Self-pay | Admitting: Internal Medicine

## 2016-09-16 ENCOUNTER — Other Ambulatory Visit (INDEPENDENT_AMBULATORY_CARE_PROVIDER_SITE_OTHER): Payer: 59

## 2016-09-16 ENCOUNTER — Telehealth: Payer: Self-pay

## 2016-09-16 ENCOUNTER — Other Ambulatory Visit: Payer: Self-pay | Admitting: Internal Medicine

## 2016-09-16 DIAGNOSIS — M545 Low back pain, unspecified: Secondary | ICD-10-CM

## 2016-09-16 DIAGNOSIS — M1990 Unspecified osteoarthritis, unspecified site: Secondary | ICD-10-CM | POA: Diagnosis not present

## 2016-09-16 DIAGNOSIS — E7800 Pure hypercholesterolemia, unspecified: Secondary | ICD-10-CM | POA: Diagnosis not present

## 2016-09-16 LAB — BASIC METABOLIC PANEL
BUN: 13 mg/dL (ref 6–23)
CALCIUM: 9.7 mg/dL (ref 8.4–10.5)
CO2: 31 mEq/L (ref 19–32)
CREATININE: 0.73 mg/dL (ref 0.40–1.20)
Chloride: 103 mEq/L (ref 96–112)
GFR: 87.95 mL/min (ref >60.00)
GLUCOSE: 87 mg/dL (ref 70–99)
Potassium: 4.2 mEq/L (ref 3.5–5.1)
Sodium: 142 mEq/L (ref 135–145)

## 2016-09-16 LAB — T4, FREE: Free T4: 0.87 ng/dL (ref 0.60–1.60)

## 2016-09-16 LAB — LIPID PANEL
CHOLESTEROL: 246 mg/dL — AB (ref 0–200)
HDL: 70 mg/dL (ref >39.00)
LDL Cholesterol: 157 mg/dL — ABNORMAL HIGH (ref 0–99)
NonHDL: 175.6
Total CHOL/HDL Ratio: 4
Triglycerides: 94 mg/dL (ref 0.0–149.0)
VLDL: 18.8 mg/dL (ref 0.0–40.0)

## 2016-09-16 LAB — HEPATIC FUNCTION PANEL
ALK PHOS: 60 U/L (ref 39–117)
ALT: 16 U/L (ref 0–35)
AST: 15 U/L (ref 0–37)
Albumin: 4.5 g/dL (ref 3.5–5.2)
BILIRUBIN DIRECT: 0.2 mg/dL (ref 0.0–0.3)
BILIRUBIN TOTAL: 1.8 mg/dL — AB (ref 0.2–1.2)
Total Protein: 6.8 g/dL (ref 6.0–8.3)

## 2016-09-16 LAB — T3, FREE: T3, Free: 3.2 pg/mL (ref 2.3–4.2)

## 2016-09-16 LAB — TSH: TSH: 1.42 u[IU]/mL (ref 0.35–4.50)

## 2016-09-16 NOTE — Telephone Encounter (Signed)
Noted. I have released the orders.

## 2016-09-16 NOTE — Telephone Encounter (Signed)
Yes, as long as everything is ordered through [facility] we can send for those labs.

## 2016-09-16 NOTE — Progress Notes (Signed)
Orders place for f/u labs.

## 2016-09-16 NOTE — Addendum Note (Signed)
Addended by: Frutoso Chase A on: 09/16/2016 01:48 PM   Modules accepted: Orders

## 2016-09-16 NOTE — Telephone Encounter (Signed)
Do you have blood to send off esr, crp, ANA and rheumatoid factor.  This is where I would start.   Just let me know.  Thanks

## 2016-09-16 NOTE — Telephone Encounter (Signed)
During blood draw this morning pt requested to have labs drawn to "determine what kind of arthritis she has." I drew an extra tube just in case you wanted to add on any labs. She is having arthritic pain in her pelvis and sometimes her joints in her hands lock up. She has documented arthritis from previous MRI's. I advised pt we may not be able to add on the labs, but said I would send a message.

## 2016-09-16 NOTE — Telephone Encounter (Signed)
Orders placed for labs

## 2016-09-17 LAB — C-REACTIVE PROTEIN: CRP: 0.2 mg/L (ref <8.0)

## 2016-09-17 LAB — SEDIMENTATION RATE: SED RATE: 1 mm/h (ref 0–30)

## 2016-09-17 LAB — ANA: Anti Nuclear Antibody(ANA): POSITIVE — AB

## 2016-09-17 LAB — ANTI-NUCLEAR AB-TITER (ANA TITER)

## 2016-09-17 LAB — RHEUMATOID FACTOR

## 2016-09-18 ENCOUNTER — Encounter: Payer: Self-pay | Admitting: Internal Medicine

## 2016-09-18 DIAGNOSIS — M2550 Pain in unspecified joint: Secondary | ICD-10-CM

## 2016-09-19 NOTE — Telephone Encounter (Signed)
Order placed for rheumatology referral.  

## 2016-10-07 ENCOUNTER — Other Ambulatory Visit: Payer: Self-pay | Admitting: Internal Medicine

## 2016-10-07 NOTE — Telephone Encounter (Signed)
Please advise on refill.

## 2016-10-08 NOTE — Telephone Encounter (Signed)
Please confirm with pt if truly needs refill.  Had elevated liver enzymes previously and has been using gentle use of ibuprofen since.

## 2016-10-09 ENCOUNTER — Telehealth: Payer: Self-pay | Admitting: *Deleted

## 2016-10-09 MED ORDER — TELMISARTAN 20 MG PO TABS: 20.0000 mg | ORAL_TABLET | Freq: Every day | ORAL | 5 refills | Status: DC

## 2016-10-09 NOTE — Telephone Encounter (Signed)
Sent BP medication to pharmacy. See other message for mobic

## 2016-10-09 NOTE — Telephone Encounter (Signed)
Patient stated that she does take this every once in a while. She said that she spoke with a pharmacist and they told her that mobic may be a little better than the ibuprofen with the liver enzymes

## 2016-10-09 NOTE — Telephone Encounter (Signed)
Patient requested a medication refill for meloxicam and telmisartan Pharmacy [facility]

## 2016-12-26 ENCOUNTER — Telehealth: Payer: Self-pay | Admitting: Internal Medicine

## 2016-12-26 NOTE — Telephone Encounter (Signed)
Patient came in this afternoon stating she was seeing and an Increase in BP readings at home BP today at work Left 145/92 and right arm 141/88. Blood pressure checked in office after resting 8 minutes and arm at heart level. Left arm 126/888 pulse 82 right arm 118/86 pulse 80, discussed readings with PCP before sending message.

## 2016-12-29 ENCOUNTER — Other Ambulatory Visit: Payer: Self-pay | Admitting: Internal Medicine

## 2016-12-29 DIAGNOSIS — Z1231 Encounter for screening mammogram for malignant neoplasm of breast: Secondary | ICD-10-CM

## 2016-12-29 NOTE — Telephone Encounter (Signed)
Sent pt a my chart message regarding f/u on blood pressure.

## 2017-01-23 ENCOUNTER — Telehealth: Payer: Self-pay | Admitting: Internal Medicine

## 2017-01-23 DIAGNOSIS — E7800 Pure hypercholesterolemia, unspecified: Secondary | ICD-10-CM

## 2017-01-23 DIAGNOSIS — I10 Essential (primary) hypertension: Secondary | ICD-10-CM

## 2017-01-23 NOTE — Telephone Encounter (Signed)
Pt called back and was scheduled for her fasting labs. Thank you!

## 2017-01-23 NOTE — Telephone Encounter (Signed)
Please advise 

## 2017-01-23 NOTE — Telephone Encounter (Signed)
Orders placed for labs.  Please schedule fasting lab appt 1-2 days before her 02/16/17 appt.

## 2017-01-23 NOTE — Telephone Encounter (Signed)
Pt called and wanted to know if she needs to have blood work done before her appt on 02/16/17. Please advise, thank you!  Call pt @ [phone] (cell) or [phone] (work)

## 2017-01-23 NOTE — Telephone Encounter (Signed)
Left message for patient to return call back.  

## 2017-01-26 ENCOUNTER — Ambulatory Visit
Admission: RE | Admit: 2017-01-26 | Discharge: 2017-01-26 | Disposition: A | Discharge status: Home / Self Care | Payer: 59 | Source: Ambulatory Visit | Attending: Internal Medicine | Admitting: Internal Medicine

## 2017-01-26 DIAGNOSIS — Z1231 Encounter for screening mammogram for malignant neoplasm of breast: Secondary | ICD-10-CM | POA: Diagnosis not present

## 2017-02-03 DIAGNOSIS — L814 Other melanin hyperpigmentation: Secondary | ICD-10-CM | POA: Diagnosis not present

## 2017-02-03 DIAGNOSIS — Z86018 Personal history of other benign neoplasm: Secondary | ICD-10-CM | POA: Diagnosis not present

## 2017-02-03 DIAGNOSIS — L918 Other hypertrophic disorders of the skin: Secondary | ICD-10-CM | POA: Diagnosis not present

## 2017-02-03 DIAGNOSIS — L2089 Other atopic dermatitis: Secondary | ICD-10-CM | POA: Diagnosis not present

## 2017-02-03 DIAGNOSIS — L821 Other seborrheic keratosis: Secondary | ICD-10-CM | POA: Diagnosis not present

## 2017-02-03 DIAGNOSIS — L603 Nail dystrophy: Secondary | ICD-10-CM | POA: Diagnosis not present

## 2017-02-04 DIAGNOSIS — H5213 Myopia, bilateral: Secondary | ICD-10-CM | POA: Diagnosis not present

## 2017-02-10 ENCOUNTER — Other Ambulatory Visit (INDEPENDENT_AMBULATORY_CARE_PROVIDER_SITE_OTHER): Payer: 59

## 2017-02-10 DIAGNOSIS — I10 Essential (primary) hypertension: Secondary | ICD-10-CM

## 2017-02-10 DIAGNOSIS — E7800 Pure hypercholesterolemia, unspecified: Secondary | ICD-10-CM | POA: Diagnosis not present

## 2017-02-10 LAB — LIPID PANEL
CHOLESTEROL: 196 mg/dL (ref 0–200)
HDL: 65.3 mg/dL (ref >39.00)
LDL CALC: 111 mg/dL — AB (ref 0–99)
NonHDL: 130.67
TRIGLYCERIDES: 100 mg/dL (ref 0.0–149.0)
Total CHOL/HDL Ratio: 3
VLDL: 20 mg/dL (ref 0.0–40.0)

## 2017-02-10 LAB — BASIC METABOLIC PANEL
BUN: 14 mg/dL (ref 6–23)
CALCIUM: 9.9 mg/dL (ref 8.4–10.5)
CO2: 32 mEq/L (ref 19–32)
Chloride: 104 mEq/L (ref 96–112)
Creatinine, Ser: 0.81 mg/dL (ref 0.40–1.20)
GFR: 77.88 mL/min (ref >60.00)
Glucose, Bld: 90 mg/dL (ref 70–99)
POTASSIUM: 4.7 meq/L (ref 3.5–5.1)
SODIUM: 141 meq/L (ref 135–145)

## 2017-02-10 LAB — HEPATIC FUNCTION PANEL
ALBUMIN: 4.5 g/dL (ref 3.5–5.2)
ALT: 22 U/L (ref 0–35)
AST: 21 U/L (ref 0–37)
Alkaline Phosphatase: 59 U/L (ref 39–117)
Bilirubin, Direct: 0.2 mg/dL (ref 0.0–0.3)
TOTAL PROTEIN: 6.9 g/dL (ref 6.0–8.3)
Total Bilirubin: 1.5 mg/dL — ABNORMAL HIGH (ref 0.2–1.2)

## 2017-02-11 ENCOUNTER — Encounter: Payer: Self-pay | Admitting: Internal Medicine

## 2017-02-16 ENCOUNTER — Ambulatory Visit: Payer: Self-pay | Admitting: Internal Medicine

## 2017-02-19 ENCOUNTER — Ambulatory Visit: Payer: Self-pay | Admitting: Internal Medicine

## 2017-03-01 ENCOUNTER — Encounter: Payer: Self-pay | Admitting: Internal Medicine

## 2017-03-02 ENCOUNTER — Encounter: Payer: Self-pay | Admitting: Physician Assistant

## 2017-03-02 ENCOUNTER — Telehealth: Payer: Self-pay | Admitting: *Deleted

## 2017-03-02 ENCOUNTER — Ambulatory Visit: Payer: Self-pay | Admitting: Physician Assistant

## 2017-03-02 VITALS — BP 110/90 | HR 90 | Temp 98.7°F

## 2017-03-02 DIAGNOSIS — N390 Urinary tract infection, site not specified: Secondary | ICD-10-CM

## 2017-03-02 DIAGNOSIS — R300 Dysuria: Secondary | ICD-10-CM

## 2017-03-02 DIAGNOSIS — R319 Hematuria, unspecified: Secondary | ICD-10-CM

## 2017-03-02 LAB — POCT URINALYSIS DIPSTICK
BILIRUBIN UA: NEGATIVE
Glucose, UA: NEGATIVE
Ketones, UA: NEGATIVE
Nitrite, UA: NEGATIVE
PROTEIN UA: NEGATIVE
Spec Grav, UA: 1.01 (ref 1.010–1.025)
Urobilinogen, UA: 0.2 E.U./dL
pH, UA: 6.5 (ref 5.0–8.0)

## 2017-03-02 MED ORDER — FLUCONAZOLE 150 MG PO TABS: 150.0000 mg | ORAL_TABLET | Freq: Once | ORAL | 0 refills | Status: AC

## 2017-03-02 MED ORDER — CIPROFLOXACIN HCL 250 MG PO TABS: 250.0000 mg | ORAL_TABLET | Freq: Two times a day (BID) | ORAL | 0 refills | Status: DC

## 2017-03-02 NOTE — Telephone Encounter (Signed)
Patient informed will need to be evaluated before u/a can be ordered. Has app tomorrow she was fine with waiting

## 2017-03-02 NOTE — Telephone Encounter (Signed)
FYI Patient was seen by her health clinic, she has a UTI and was prescribed Cipro

## 2017-03-02 NOTE — Telephone Encounter (Signed)
FYI pt has been scheduled for 05/22

## 2017-03-02 NOTE — Progress Notes (Signed)
S:  C/o uti sx for 2 days, burning, urgency, frequency, denies vaginal discharge, abdominal pain or flank pain:  Remainder ros neg  O:  Vitals wnl, nad, no cva tenderness, back nontender, lungs c t a,cv rrr, abd soft nontender, bs normal, n/v intact, ua 1+ leuks,1+ blood  A: uti  P: cipro 250mg  bid x 7d, increase water intake, add cranberry juice, return if not improving in 2 -3 days, return earlier if worsening, discussed pyelonephritis sx

## 2017-03-02 NOTE — Telephone Encounter (Signed)
Patient suspects that she may have a Uti. She requested to stop in for a urine specimen today, pt was informed that she would need to see a provider. Pt contact [phone]

## 2017-03-03 ENCOUNTER — Ambulatory Visit: Payer: Self-pay | Admitting: Family

## 2017-03-03 NOTE — Telephone Encounter (Signed)
Noted.  Tell her to keep [place] posted and let us know if needs anything.

## 2017-03-03 NOTE — Telephone Encounter (Signed)
FYI

## 2017-03-19 ENCOUNTER — Ambulatory Visit: Payer: Self-pay | Admitting: Internal Medicine

## 2017-03-20 ENCOUNTER — Other Ambulatory Visit (HOSPITAL_COMMUNITY)
Admission: RE | Admit: 2017-03-20 | Discharge: 2017-03-20 | Disposition: A | Discharge status: Home / Self Care | Payer: 59 | Source: Ambulatory Visit | Attending: Internal Medicine | Admitting: Internal Medicine

## 2017-03-20 ENCOUNTER — Encounter: Payer: Self-pay | Admitting: Internal Medicine

## 2017-03-20 ENCOUNTER — Ambulatory Visit (INDEPENDENT_AMBULATORY_CARE_PROVIDER_SITE_OTHER): Payer: 59 | Admitting: Internal Medicine

## 2017-03-20 VITALS — BP 124/80 | HR 98 | Temp 98.6°F | Ht 64.0 in | Wt 116.0 lb

## 2017-03-20 DIAGNOSIS — N952 Postmenopausal atrophic vaginitis: Secondary | ICD-10-CM | POA: Diagnosis not present

## 2017-03-20 DIAGNOSIS — I10 Essential (primary) hypertension: Secondary | ICD-10-CM

## 2017-03-20 DIAGNOSIS — E7800 Pure hypercholesterolemia, unspecified: Secondary | ICD-10-CM

## 2017-03-20 DIAGNOSIS — N949 Unspecified condition associated with female genital organs and menstrual cycle: Secondary | ICD-10-CM

## 2017-03-20 DIAGNOSIS — N760 Acute vaginitis: Secondary | ICD-10-CM | POA: Diagnosis not present

## 2017-03-20 DIAGNOSIS — M545 Low back pain, unspecified: Secondary | ICD-10-CM

## 2017-03-20 MED ORDER — ESTRADIOL 0.1 MG/GM VA CREA: TOPICAL_CREAM | VAGINAL | 1 refills | Status: DC

## 2017-03-20 NOTE — Progress Notes (Signed)
Pre-visit discussion using our clinic review tool. No additional management support is needed unless otherwise documented below in the visit note.  

## 2017-03-20 NOTE — Progress Notes (Signed)
[name], female   DOB: December 29, 1960, 56 y.o.   MRN: [id number]   Subjective:    [name], female    DOB: 17-Apr-1961, 55 y.o.   MRN: [id number]  HPI  Patient here for a scheduled follow up.   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 Was having dysuria.  Placed on cipro for uti.  Urinary symptoms have resolved.  She is in a new relationship.  Reports vaginal dryness.  Request vaginal estrogen.  No abdominal pain. No vaginal itching or irritation.  States her blood pressure has been running a little high.  See attached list.  No chest pain.  No sob.  Bowels stable.  Has adjusted her diet.  Lost weight.  She feels better.  Eating better.     Past Medical History:  Diagnosis Date  . Eczema   . Frequent UTI   . H/O lymphadenopathy    With previous submandibular node removals.  . Hypertension   . Recurrent sinus infections    Past Surgical History:  Procedure Laterality Date  . Lymph Node Removal  1984  . NECK SURGERY  1977   H/O Right neck surgery secondary to a benign growth with when patient was 58 or 56 yo  . skin lesion extraction  2013   basal cell - Dr Phillip Heal  . Uterine Polyps Removed  2004   Family History  Problem Relation Age of Onset  . Hyperlipidemia Mother   . Thyroid disease Mother   . Heart disease Mother        Heart Valve problems  . Coronary artery disease Father        Stents placed  . Hypertension Unknown        uncle  . Parkinson's disease Paternal Uncle   . Parkinson's disease Maternal Grandmother   . Heart disease Maternal Grandfather   . Heart disease Paternal Grandfather        MI  . Breast cancer Neg Hx    Social History   Social History  . Marital status: Divorced    Spouse name: N/A  . Number of children: 2  . Years of education: N/A   Occupational History  .  [employer]   Social History Main Topics  . Smoking status: Never Smoker  . Smokeless tobacco: Never Used  . Alcohol use 0.0 oz/week   Comment: Occasional  . Drug use: No  . Sexual activity: Not Asked   Other Topics Concern  . None   Social History Narrative  . None    Outpatient Encounter Prescriptions as of 03/20/2017  Medication Sig  . Calcium Carbonate-Vitamin D (CALCIUM-VITAMIN D) 500-200 MG-UNIT per tablet Take 1 tablet by mouth daily.  . fish oil-omega-3 fatty acids 1000 MG capsule Take by mouth daily.  . fluocinonide (LIDEX) 0.05 % external solution APPLY TO AFFECTED AREA(S) AT BEDTIME  . fluocinonide gel (LIDEX) 7.35 % Apply 1 application topically 2 (two) times daily. Apply to affected area twice a day as needed.  . fluticasone (FLONASE) 50 MCG/ACT nasal spray Place 2 sprays into both nostrils daily.  . meloxicam (MOBIC) 7.5 MG tablet TAKE 1 TABLET BY MOUTH DAILY AS NEEDED FOR PAIN.  . Multiple Vitamin (MULTIVITAMIN) tablet Take 1 tablet by mouth daily.  Marland Kitchen telmisartan (MICARDIS) 20 MG tablet Take 1 tablet (20 mg total) by mouth daily.  Marland Kitchen tretinoin (RETIN-A) 0.05 % cream Apply topically at bedtime. Apply to affected area three times a day as needed for  Acne  . triamcinolone (NASACORT AQ) 55 MCG/ACT AERO nasal inhaler Place 2 sprays into the nose daily.  Marland Kitchen estradiol (ESTRACE VAGINAL) 0.1 MG/GM vaginal cream One application q hs for 5 nights and then 2 x/week  . [DISCONTINUED] ciprofloxacin (CIPRO) 250 MG tablet Take 1 tablet (250 mg total) by mouth 2 (two) times daily.   No facility-administered encounter medications on file as of 03/20/2017.     Review of Systems  Constitutional:       Has adjusted her diet.  Lost weight.    HENT: Negative for congestion and sinus pressure.   Respiratory: Negative for cough, chest tightness and shortness of breath.   Cardiovascular: Negative for chest pain, palpitations and leg swelling.  Gastrointestinal: Negative for abdominal pain, diarrhea, nausea and vomiting.  Genitourinary: Negative for difficulty urinating, dysuria and vaginal pain.  Musculoskeletal: Negative for  back pain and joint swelling.  Skin: Negative for color change and rash.  Neurological: Negative for dizziness, light-headedness and headaches.  Psychiatric/Behavioral: Negative for agitation and dysphoric mood.       Objective:    Physical Exam  Constitutional: She appears well-developed and well-nourished. No distress.  HENT:  Nose: Nose normal.  Mouth/Throat: Oropharynx is clear and moist.  Neck: Neck supple. No thyromegaly present.  Cardiovascular: Normal rate and regular rhythm.   Pulmonary/Chest: Breath sounds normal. No respiratory distress. She has no wheezes.  Abdominal: Soft. Bowel sounds are normal. There is no tenderness.  Genitourinary:  Genitourinary Comments: GU exam reveals normal external genitalia.  Vaginal vault without lesions.  Some atrophy changes present.  KOH/wet prep obtained.  Could not appreciate any adnexal masses or tenderness.    Musculoskeletal: She exhibits no edema or tenderness.  Lymphadenopathy:    She has no cervical adenopathy.  Skin: No rash noted. No erythema.  Psychiatric: She has a normal mood and affect. Her behavior is normal.    BP 124/80 (BP Location: Left Arm, Patient Position: Sitting, Cuff Size: Normal)   Pulse 98   Temp 98.6 F (37 C) (Oral)   Ht 5\' 4"  (1.626 m)   Wt 116 lb (52.6 kg)   LMP 12/01/2008   SpO2 98%   BMI 19.91 kg/m  Wt Readings from Last 3 Encounters:  03/20/17 116 lb (52.6 kg)  08/14/16 120 lb 3.2 oz (54.5 kg)  03/14/16 123 lb (55.8 kg)     Lab Results  Component Value Date   WBC 4.4 05/07/2016   HGB 13.5 05/07/2016   HCT 40.2 05/07/2016   PLT 286.0 05/07/2016   GLUCOSE 90 02/10/2017   CHOL 196 02/10/2017   TRIG 100.0 02/10/2017   HDL 65.30 02/10/2017   LDLDIRECT 148.9 08/15/2013   LDLCALC 111 (H) 02/10/2017   ALT 22 02/10/2017   AST 21 02/10/2017   NA 141 02/10/2017   K 4.7 02/10/2017   CL 104 02/10/2017   CREATININE 0.81 02/10/2017   BUN 14 02/10/2017   CO2 32 02/10/2017   TSH 1.42  09/16/2016    Mm Screening Breast Tomo Bilateral  Result Date: 01/27/2017 CLINICAL DATA:  Screening. EXAM: 2D DIGITAL SCREENING BILATERAL MAMMOGRAM WITH CAD AND ADJUNCT TOMO COMPARISON:  Previous exam(s). ACR Breast Density Category c: The breast tissue is heterogeneously dense, which may obscure small masses. FINDINGS: There are no findings suspicious for malignancy. Images were processed with CAD. IMPRESSION: No mammographic evidence of malignancy. A result letter of this screening mammogram will be mailed directly to the patient. RECOMMENDATION: Screening mammogram in one year. (Code:SM-B-01Y) BI-RADS  CATEGORY  1: Negative. Electronically Signed   By: Franki Cabot M.D.   On: 01/27/2017 08:51       Assessment & Plan:   Problem List Items Addressed This Visit    Back pain    Stable.  Follow.        Essential hypertension, benign    Blood pressure has been slightly increased as outlined.  Will increase micardis to 20mg  q day.   Follow pressures.  Follow metabolic panel.        Hypercholesterolemia    Low cholesterol diet and exercise.  Follow lipid panel.        Vaginal atrophy    Estrace cream as directed.  Follow.       Vaginitis    Exam as outlined.  KOH/wet prep obtained.         Other Visit Diagnoses    Vaginal discomfort    -  Primary   Relevant Orders   Cervicovaginal ancillary only       Einar Pheasant, MD

## 2017-03-22 ENCOUNTER — Encounter: Payer: Self-pay | Admitting: Internal Medicine

## 2017-03-22 DIAGNOSIS — N760 Acute vaginitis: Secondary | ICD-10-CM | POA: Insufficient documentation

## 2017-03-22 DIAGNOSIS — N952 Postmenopausal atrophic vaginitis: Secondary | ICD-10-CM | POA: Insufficient documentation

## 2017-03-22 NOTE — Assessment & Plan Note (Signed)
Low cholesterol diet and exercise.  Follow lipid panel.   

## 2017-03-22 NOTE — Assessment & Plan Note (Signed)
Stable.  Follow.   

## 2017-03-22 NOTE — Assessment & Plan Note (Signed)
Estrace cream as directed.  Follow.

## 2017-03-22 NOTE — Assessment & Plan Note (Addendum)
Blood pressure has been slightly increased as outlined.  Will increase micardis to 20mg  q day.   Follow pressures.  Follow metabolic panel.

## 2017-03-22 NOTE — Assessment & Plan Note (Signed)
Exam as outlined.  KOH/wet prep obtained.   

## 2017-03-23 ENCOUNTER — Encounter: Payer: Self-pay | Admitting: Internal Medicine

## 2017-03-23 LAB — CERVICOVAGINAL ANCILLARY ONLY: Wet Prep (BD Affirm): NEGATIVE

## 2017-03-24 DIAGNOSIS — L300 Nummular dermatitis: Secondary | ICD-10-CM | POA: Diagnosis not present

## 2017-04-16 DIAGNOSIS — K644 Residual hemorrhoidal skin tags: Secondary | ICD-10-CM | POA: Diagnosis not present

## 2017-05-21 ENCOUNTER — Telehealth: Payer: Self-pay | Admitting: Internal Medicine

## 2017-05-21 NOTE — Telephone Encounter (Signed)
Pt called and stated that she received a robo call in regards to a colon screening. Pt states that last time she was here she was told that she can go pick it up at the drug store, pt is unable to find it. Please advise, thank you!  Call pt @ [phone] Or [phone]

## 2017-05-22 NOTE — Telephone Encounter (Signed)
Called patient not able to leave v/m

## 2017-06-05 NOTE — Telephone Encounter (Signed)
Have called patient x 2. No call back will close message until return call from patient.

## 2017-06-23 ENCOUNTER — Encounter: Payer: Self-pay | Admitting: Internal Medicine

## 2017-06-23 ENCOUNTER — Ambulatory Visit (INDEPENDENT_AMBULATORY_CARE_PROVIDER_SITE_OTHER): Payer: 59 | Admitting: Internal Medicine

## 2017-06-23 DIAGNOSIS — M79604 Pain in right leg: Secondary | ICD-10-CM

## 2017-06-23 DIAGNOSIS — I10 Essential (primary) hypertension: Secondary | ICD-10-CM | POA: Diagnosis not present

## 2017-06-23 DIAGNOSIS — E7800 Pure hypercholesterolemia, unspecified: Secondary | ICD-10-CM

## 2017-06-23 DIAGNOSIS — M545 Low back pain, unspecified: Secondary | ICD-10-CM

## 2017-06-23 MED ORDER — TIZANIDINE HCL 2 MG PO CAPS: 2.0000 mg | ORAL_CAPSULE | Freq: Every evening | ORAL | 0 refills | Status: DC | PRN

## 2017-06-23 NOTE — Progress Notes (Signed)
[name], female   DOB: 28-Sep-1961, 56 y.o.   MRN: [id number]   Subjective:    [name], female    DOB: 26-Mar-1961, 56 y.o.   MRN: [id number]  HPI  Patient here for a scheduled follow up.  She reports she is doing relatively well.  Blood pressure has been averaging 117-127/85-90 recently.  Taking her blood pressure medication.  Stays active.  No chest pain.  No sob.  No acid reflux.  No abdominal pain.  Bowels moving.  Reports that over the last 8 weeks has noticed increased cramping and burning discomfort in her right thigh.  Went for a massage.  Worked on her quadriceps muscle.  Certain positions and movements aggravate.  Bending pulls.  Has been taking ibuprofen.  Helps, but has been persistent.     Past Medical History:  Diagnosis Date  . Eczema   . Frequent UTI   . H/O lymphadenopathy    With previous submandibular node removals.  . Hypertension   . Recurrent sinus infections    Past Surgical History:  Procedure Laterality Date  . Lymph Node Removal  1984  . NECK SURGERY  1977   H/O Right neck surgery secondary to a benign growth with when patient was 23 or 56 yo  . skin lesion extraction  2013   basal cell - Dr Phillip Heal  . Uterine Polyps Removed  2004   Family History  Problem Relation Age of Onset  . Hyperlipidemia Mother   . Thyroid disease Mother   . Heart disease Mother        Heart Valve problems  . Coronary artery disease Father        Stents placed  . Hypertension Unknown        uncle  . Parkinson's disease Paternal Uncle   . Parkinson's disease Maternal Grandmother   . Heart disease Maternal Grandfather   . Heart disease Paternal Grandfather        MI  . Breast cancer Neg Hx    Social History   Social History  . Marital status: Divorced    Spouse name: N/A  . Number of children: 2  . Years of education: N/A   Occupational History  .  [employer]   Social History Main Topics  . Smoking status: Never Smoker  .  Smokeless tobacco: Never Used  . Alcohol use 0.0 oz/week     Comment: Occasional  . Drug use: No  . Sexual activity: Not Asked   Other Topics Concern  . None   Social History Narrative  . None    Outpatient Encounter Prescriptions as of 06/23/2017  Medication Sig  . Calcium Carbonate-Vitamin D (CALCIUM-VITAMIN D) 500-200 MG-UNIT per tablet Take 1 tablet by mouth daily.  Marland Kitchen estradiol (ESTRACE VAGINAL) 0.1 MG/GM vaginal cream One application q hs for 5 nights and then 2 x/week  . fish oil-omega-3 fatty acids 1000 MG capsule Take by mouth daily.  . fluocinonide (LIDEX) 0.05 % external solution APPLY TO AFFECTED AREA(S) AT BEDTIME  . fluocinonide gel (LIDEX) 6.44 % Apply 1 application topically 2 (two) times daily. Apply to affected area twice a day as needed.  . meloxicam (MOBIC) 7.5 MG tablet TAKE 1 TABLET BY MOUTH DAILY AS NEEDED FOR PAIN.  . Multiple Vitamin (MULTIVITAMIN) tablet Take 1 tablet by mouth daily.  Marland Kitchen telmisartan (MICARDIS) 20 MG tablet Take 1 tablet (20 mg total) by mouth daily.  Marland Kitchen tretinoin (RETIN-A) 0.05 % cream Apply  topically at bedtime. Apply to affected area three times a day as needed for Acne  . tizanidine (ZANAFLEX) 2 MG capsule Take 1 capsule (2 mg total) by mouth at bedtime as needed for muscle spasms.  . [DISCONTINUED] fluticasone (FLONASE) 50 MCG/ACT nasal spray Place 2 sprays into both nostrils daily.  . [DISCONTINUED] triamcinolone (NASACORT AQ) 55 MCG/ACT AERO nasal inhaler Place 2 sprays into the nose daily.   No facility-administered encounter medications on file as of 06/23/2017.     Review of Systems  Constitutional: Negative for appetite change and unexpected weight change.  HENT: Negative for congestion and sinus pressure.   Respiratory: Negative for cough, chest tightness and shortness of breath.   Cardiovascular: Negative for chest pain, palpitations and leg swelling.  Gastrointestinal: Negative for abdominal pain, diarrhea, nausea and vomiting.    Genitourinary: Negative for difficulty urinating and dysuria.  Musculoskeletal: Negative for back pain and joint swelling.       Increased thigh cramping and pain as outlined.    Skin: Negative for color change and rash.  Neurological: Negative for dizziness, light-headedness and headaches.  Psychiatric/Behavioral: Negative for agitation and dysphoric mood.       Objective:     Blood pressure rechecked by me:  130/78  Physical Exam  Constitutional: She appears well-developed and well-nourished. No distress.  HENT:  Nose: Nose normal.  Mouth/Throat: Oropharynx is clear and moist.  Neck: Neck supple. No thyromegaly present.  Cardiovascular: Normal rate and regular rhythm.   Pulmonary/Chest: Breath sounds normal. No respiratory distress. She has no wheezes.  Abdominal: Soft. Bowel sounds are normal. There is no tenderness.  Musculoskeletal: She exhibits no edema or tenderness.  Some increased cramping with full extension of her right leg and with relaxation.  No motor weakness.  No pain with palpation.    Lymphadenopathy:    She has no cervical adenopathy.  Skin: No rash noted. No erythema.  Psychiatric: She has a normal mood and affect. Her behavior is normal.    BP 122/84 (BP Location: Left Arm, Patient Position: Sitting, Cuff Size: Normal)   Pulse 90   Temp 98.4 F (36.9 C) (Oral)   Resp 20   Ht 5' 4.17" (1.63 m)   Wt 116 lb 12.8 oz (53 kg)   LMP 12/01/2008   SpO2 98%   BMI 19.94 kg/m  Wt Readings from Last 3 Encounters:  06/23/17 116 lb 12.8 oz (53 kg)  03/20/17 116 lb (52.6 kg)  08/14/16 120 lb 3.2 oz (54.5 kg)     Lab Results  Component Value Date   WBC 4.4 05/07/2016   HGB 13.5 05/07/2016   HCT 40.2 05/07/2016   PLT 286.0 05/07/2016   GLUCOSE 90 02/10/2017   CHOL 196 02/10/2017   TRIG 100.0 02/10/2017   HDL 65.30 02/10/2017   LDLDIRECT 148.9 08/15/2013   LDLCALC 111 (H) 02/10/2017   ALT 22 02/10/2017   AST 21 02/10/2017   NA 141 02/10/2017   K 4.7  02/10/2017   CL 104 02/10/2017   CREATININE 0.81 02/10/2017   BUN 14 02/10/2017   CO2 32 02/10/2017   TSH 1.42 09/16/2016       Assessment & Plan:   Problem List Items Addressed This Visit    Back pain    No significant back problems now.  Follow.        Relevant Medications   tizanidine (ZANAFLEX) 2 MG capsule   Essential hypertension, benign    Blood pressure under good control.  Continue same medication regimen.  Follow pressures.  Follow metabolic panel.        Hypercholesterolemia    Low cholesterol diet and exercise.  Follow lipid panel.       Right leg pain    No back pain noted.  Cramping with certain movements on exam.  No motor weakness.  Treat with tizanidine as directed.  Refer to physical therapy.        Relevant Orders   Ambulatory referral to Physical Therapy       Einar Pheasant, MD

## 2017-06-24 ENCOUNTER — Telehealth: Payer: Self-pay | Admitting: *Deleted

## 2017-06-24 ENCOUNTER — Telehealth: Payer: Self-pay | Admitting: Internal Medicine

## 2017-06-24 NOTE — Telephone Encounter (Signed)
Gave ok to change to tablets.  Spoke to pharmacist. She took verbal.  Did not need new rx sent in.

## 2017-06-24 NOTE — Telephone Encounter (Signed)
[facility] pharmacy to have the script for tizanidine switched from capsule to tablet , they are not equivalent  Contact [facility] [phone]

## 2017-06-24 NOTE — Telephone Encounter (Signed)
Please advise does it need to be changed to tab

## 2017-06-24 NOTE — Telephone Encounter (Signed)
Pt called and was following up on a discussion that was had at her appt yesterday in regards to a booster for a tetanus. Pt also wanted to get an order for a colonoscopy or cologuard, which ever she recommends. Pt stated there is no hx of colon cancer but felt that it was time to have it done do to her age. Please advise, thank you!  Call pt @ [phone] (work today) [phone] (cell, leave msg)

## 2017-06-25 ENCOUNTER — Encounter: Payer: Self-pay | Admitting: Internal Medicine

## 2017-06-25 DIAGNOSIS — M79604 Pain in right leg: Secondary | ICD-10-CM | POA: Insufficient documentation

## 2017-06-25 NOTE — Telephone Encounter (Signed)
Left message to return call to our office.  

## 2017-06-25 NOTE — Assessment & Plan Note (Signed)
No significant back problems now.  Follow.

## 2017-06-25 NOTE — Assessment & Plan Note (Signed)
No back pain noted.  Cramping with certain movements on exam.  No motor weakness.  Treat with tizanidine as directed.  Refer to physical therapy.

## 2017-06-25 NOTE — Assessment & Plan Note (Signed)
Blood pressure under good control.  Continue same medication regimen.  Follow pressures.  Follow metabolic panel.   

## 2017-06-25 NOTE — Telephone Encounter (Signed)
Patient came by our office she would like to call insurance to see what GI office will be covered under insurance better. She will call employee health and find out if she had TDAP or TD. Informed that PT will call to make app after referral has been completed.

## 2017-06-25 NOTE — Assessment & Plan Note (Signed)
Low cholesterol diet and exercise.  Follow lipid panel.   

## 2017-06-25 NOTE — Telephone Encounter (Signed)
Referral for PT has been placed

## 2017-06-25 NOTE — Telephone Encounter (Signed)
I can place an order for referral to GI for colonoscopy.  Need to know who she prefers to see.  Just need to clarify what she needs regarding the tetanus.  She does not need rx for this.  If wants to come here to get a tetanus, just need to clarify when her last and if she has had Tdap.

## 2017-06-25 NOTE — Telephone Encounter (Signed)
Pt called back returning your call. Please advise, thank you!  Call pt @ [phone]

## 2017-06-30 ENCOUNTER — Ambulatory Visit: Payer: Self-pay | Admitting: Physician Assistant

## 2017-06-30 ENCOUNTER — Encounter: Payer: Self-pay | Admitting: Physician Assistant

## 2017-06-30 VITALS — BP 130/80 | HR 116 | Temp 100.5°F

## 2017-06-30 DIAGNOSIS — R300 Dysuria: Secondary | ICD-10-CM

## 2017-06-30 DIAGNOSIS — N390 Urinary tract infection, site not specified: Secondary | ICD-10-CM

## 2017-06-30 DIAGNOSIS — R319 Hematuria, unspecified: Secondary | ICD-10-CM

## 2017-06-30 LAB — POCT URINALYSIS DIPSTICK
Bilirubin, UA: NEGATIVE
GLUCOSE UA: NEGATIVE
Ketones, UA: NEGATIVE
Nitrite, UA: NEGATIVE
Protein, UA: NEGATIVE
Spec Grav, UA: 1.01 (ref 1.010–1.025)
UROBILINOGEN UA: 0.2 U/dL
pH, UA: 7 (ref 5.0–8.0)

## 2017-06-30 MED ORDER — NITROFURANTOIN MONOHYD MACRO 100 MG PO CAPS: 100.0000 mg | ORAL_CAPSULE | Freq: Two times a day (BID) | ORAL | 0 refills | Status: DC

## 2017-06-30 NOTE — Progress Notes (Signed)
S:  C/o uti sx for 2 days, fever, frequency, denies vaginal discharge, abdominal pain or flank pain:  Remainder ros neg  O:  Vitals wnl, nad, no cva tenderness, back nontender, lungs c t a,cv rrr, abd soft nontender, bs normal, n/v intact, ua 2+ leuks, 2+ blood  A: uti  P: macrobid bid x 7d, increase water intake, add cranberry juice, return if not improving in 2 -3 days, return earlier if worsening, discussed pyelonephritis sx

## 2017-07-08 DIAGNOSIS — K644 Residual hemorrhoidal skin tags: Secondary | ICD-10-CM | POA: Diagnosis not present

## 2017-07-14 ENCOUNTER — Ambulatory Visit: Payer: 59 | Attending: Internal Medicine | Admitting: Physical Therapy

## 2017-07-14 ENCOUNTER — Encounter: Payer: Self-pay | Admitting: Physical Therapy

## 2017-07-14 DIAGNOSIS — M79652 Pain in left thigh: Secondary | ICD-10-CM | POA: Insufficient documentation

## 2017-07-14 DIAGNOSIS — M79651 Pain in right thigh: Secondary | ICD-10-CM | POA: Insufficient documentation

## 2017-07-14 DIAGNOSIS — M6281 Muscle weakness (generalized): Secondary | ICD-10-CM | POA: Diagnosis not present

## 2017-07-16 ENCOUNTER — Ambulatory Visit: Payer: 59 | Admitting: Physical Therapy

## 2017-07-16 NOTE — Therapy (Addendum)
[facility] [address] [place], [address] Phone: [phone]   Fax:  [phone]  Physical Therapy Evaluation  Patient Details  Name: Sara Perry MRN: [id number] Date of Birth: Jan 10, 1961 Referring Provider: Einar Pheasant MD  Encounter Date: 07/14/2017      PT End of Session - 07/14/17 1900    Visit Number 1   Number of Visits 12   Date for PT Re-Evaluation 08/25/17   PT Start Time 1706   PT Stop Time 1805   PT Time Calculation (min) 59 min   Activity Tolerance Patient tolerated treatment well   Behavior During Therapy [facility] for tasks assessed/performed      Past Medical History:  Diagnosis Date  . Eczema   . Frequent UTI   . H/O lymphadenopathy    With previous submandibular node removals.  . Hypertension   . Recurrent sinus infections     Past Surgical History:  Procedure Laterality Date  . Lymph Node Removal  1984  . NECK SURGERY  1977   H/O Right neck surgery secondary to a benign growth with when patient was 29 or 56 yo  . skin lesion extraction  2013   basal cell - Dr Phillip Heal  . Uterine Polyps Removed  2004    There were no vitals filed for this visit.       Subjective Assessment - 07/14/17 1731    Subjective Patient reports that 2 1/2 months ago her right leg pain began for no apparent reason, mid thigh and lateral quadriceps.   Pertinent History history of back pain with radation into legs began 2009, she had physical therapy with minimal imporvement, She also has been exercising and stretching with yoga   Limitations Other (comment)  bending, squatting, exercising   Patient Stated Goals patient would like to have pain in right leg relieved so she can return to prior level of function   Currently in Pain? Yes   Pain Score 2   ranges from 2/10 up to 5/10   Pain Location Leg   Pain Orientation Right   Pain Descriptors / Indicators Tightness;Spasm   Pain Type Acute  pain   Pain Onset More than a month ago   Pain Frequency Intermittent   Aggravating Factors  bending, squatting   Pain Relieving Factors exercise, stretching   Effect of Pain on Daily Activities limits ability to perform daily activities             [facility] PT Assessment - 07/14/17 1741      Assessment   Medical Diagnosis right leg pain, left leg pain   Referring Provider Einar Pheasant MD   Onset Date/Surgical Date 04/12/17     Precautions   Precautions None     Balance Screen   Has the patient fallen in the past 6 months No     Prior Function   Level of Independence Independent   Vocation Full time employment   Vocation Requirements standing, sitting, bending twisting  ultrasonographer   Leisure yoga, walk     Cognition   Overall Cognitive Status Within Functional Limits for tasks assessed     Observation/Other Assessments   Lower Extremity Functional Scale  42/80       Objective: Slump test: ? Positive right LE, (-) for left LE FABER right and left LE negative for reproduction of symptoms SLR and crossed SLR negative for reproduction of symptoms SIJ compression and distraction negative bilaterally Palpation: right  hip point tenderness along anterior hip distal to ASIS along quadriceps tendon attachment Piriformis decreased flexibility right hip   Objective measurements completed on examination: See above findings.          PT Education - 07/14/17 1800    Education provided Yes   Education Details POC; home exercises: hip adduction with ball and glute sets, hip abduction with resistive band, hook lying stabilizaiton marching 10 reps each LE   Person(s) Educated Patient   Methods Explanation;Demonstration;Verbal cues;Handout   Comprehension Verbalized understanding;Returned demonstration;Verbal cues required             PT Long Term Goals - 07/14/17 1825      PT LONG TERM GOAL #1   Title Patient will demonstrate improved function with daily  tasks with minimal to no diffficulty as indicated by LEFS score of at least 60/80   Baseline 42/80   Status New   Target Date 08/25/17     PT LONG TERM GOAL #2   Title Patient will be independent with home program of self management of symptoms, exercise progression without assistance in order to transition to independent self management once discharged    Baseline no knowledge of appropirate exercises and progression   Status New   Target Date 08/25/17                Plan - 07/14/17 1830    Clinical Impression Statement Patient is a 56 year old female who presents with symptoms of right thigh and left lateral leg pain hat has been worsening over the past few months. Her LEFS of 42/80 indicates moderate self perceived disabiltiy. she has limited knoweldge of apppropriate pain control strategies or exercise progression to decrease sympotms and improve function. She wil benefit from physical therapy intervention to address limitations and improve function.     History and Personal Factors relevant to plan of care: insidious onset of leg pain, anterolithesis of lower lumbar spine, previous back pain episodes   Clinical Presentation Evolving   Clinical Presentation due to: worsening symptoms and now into left LE within past week   Clinical Decision Making Low   Rehab Potential Good   Clinical Impairments Affecting Rehab Potential (+)age, prior level of function(-)back pain with anterolithesis   PT Frequency 2x / week   PT Duration 6 weeks   PT Treatment/Interventions Electrical Stimulation;Cryotherapy;Moist Heat;Patient/family education;Neuromuscular re-education;Therapeutic exercise;Manual techniques, iontophoresis with dexamethasone 4mg /ml, dry needling   PT Next Visit Plan pain control modalities, manual techniques, progressive exercises    PT Home Exercise Plan stabilization exercies sitting, lying    Consulted and Agree with Plan of Care Patient      Patient will benefit from  skilled therapeutic intervention in order to improve the following deficits and impairments:  Decreased strength, Pain, Impaired perceived functional ability, Increased muscle spasms  Visit Diagnosis: Pain in right thigh - Plan: PT plan of care cert/re-cert  Pain in left thigh - Plan: PT plan of care cert/re-cert  Muscle weakness (generalized) - Plan: PT plan of care cert/re-cert     Problem List Patient Active Problem List   Diagnosis Date Noted  . Right leg pain 06/25/2017  . Vaginitis 03/22/2017  . Vaginal atrophy 03/22/2017  . Meniere's disease 08/17/2016  . Sensation of fullness in right ear 03/15/2016  . Colon cancer screening 08/11/2015  . Fatigue 06/03/2015  . Health care maintenance 06/03/2015  . Foot pain 08/13/2014  . Neck pain 02/05/2014  . Hypercholesterolemia 02/05/2014  . Back pain 12/05/2012  .  Trigger finger 12/05/2012  . Essential hypertension, benign 12/05/2012    Jomarie Longs PT 07/16/2017, 12:28 AM  [facility] [address], [place], [address] Phone: [phone]   Fax:  [phone]  Name: Patsy Zaragoza MRN: [id number] Date of Birth: 1961-07-02

## 2017-07-20 ENCOUNTER — Encounter: Payer: 59 | Admitting: Physical Therapy

## 2017-07-21 ENCOUNTER — Encounter: Payer: 59 | Admitting: Physical Therapy

## 2017-07-22 ENCOUNTER — Encounter: Payer: Self-pay | Admitting: Physical Therapy

## 2017-07-22 ENCOUNTER — Ambulatory Visit: Payer: 59 | Admitting: Physical Therapy

## 2017-07-22 ENCOUNTER — Encounter: Payer: Self-pay | Admitting: Internal Medicine

## 2017-07-22 DIAGNOSIS — M6281 Muscle weakness (generalized): Secondary | ICD-10-CM | POA: Diagnosis not present

## 2017-07-22 DIAGNOSIS — M79651 Pain in right thigh: Secondary | ICD-10-CM

## 2017-07-22 DIAGNOSIS — M79652 Pain in left thigh: Secondary | ICD-10-CM | POA: Diagnosis not present

## 2017-07-22 NOTE — Therapy (Signed)
[facility] [address], [place], [address] Phone: [phone]   Fax:  [phone]  Physical Therapy Treatment  Patient Details  Name: Sara Perry MRN: [id number] Date of Birth: 1961-05-17 Referring Provider: Einar Pheasant MD  Encounter Date: 07/22/2017      PT End of Session - 07/22/17 1907    Visit Number 2   Number of Visits 12   Date for PT Re-Evaluation 08/25/17   PT Start Time 9381   PT Stop Time 1825   PT Time Calculation (min) 51 min   Activity Tolerance Patient tolerated treatment well   Behavior During Therapy [facility] for tasks assessed/performed      Past Medical History:  Diagnosis Date  . Eczema   . Frequent UTI   . H/O lymphadenopathy    With previous submandibular node removals.  . Hypertension   . Recurrent sinus infections     Past Surgical History:  Procedure Laterality Date  . Lymph Node Removal  1984  . NECK SURGERY  1977   H/O Right neck surgery secondary to a benign growth with when patient was 25 or 56 yo  . skin lesion extraction  2013   basal cell - Dr Phillip Heal  . Uterine Polyps Removed  2004    There were no vitals filed for this visit.      Subjective Assessment - 07/22/17 1743    Subjective Patient reports she is improving with right thigh tenderness and continues with intermittent thro   Pertinent History history of back pain with radation into legs began 2009, she had physical therapy with minimal imporvement, She also has been exercising and stretching with yoga   Limitations Other (comment)  bending, squatting, exercising   Patient Stated Goals patient would like to have pain in right leg relieved so she can return to prior level of function   Pain Onset More than a month ago     Pain level 2-3/10 Right hip and mid quadriceps Intermittent  Objective:  Palpation: point tender ASIS and tendon attachment, mid thigh along quadriceps + spasms   Observation: quad setting decreased intensity on right as compared to left   Treatment:  Manual therapy: 10 min. Goal: improved soft tissue elasticity, decrease pain, spasms STM performed to right thigh with patient supine with right LE supported on pillow, superficial techniques and compression techniques  Modalities: Electrical stimulation: Russian stim. 10/10 cycle applied (2) electrodes to quadriceps/VMO right LE with patient reclined with LE supported on pillow; pt. Performing quad sets with each cycle; goal muscle re education High volt estim.clincial program for muscle spasms (2) electrodes applied to anterior right hip over tender region and along right quadriceps lateral aspect intensity to tolerance with patient in reclined position with LE supported on pillow goal: pain, spasms  instructed patient in hip flexor, quad stretch over edge of treatment table with performing one rep each LE x 30 seconds, instructed to perform 1-2x/day 3 x 30 seconds each LE  Patient response to treatment: patient demonstrated improved technique with exercises with minimal VC for correct alignment. Patient with decreased spasms by 50% following STM. Improved motor control with quad setting following estim.         PT Education - 07/22/17 1906    Education provided Yes   Education Details re assessed exercises, instructed in use of electrical stimulaiton for muscle re education and pain control   Person(s) Educated Patient   Methods Explanation;Demonstration  Comprehension Verbalized understanding             PT Long Term Goals - 07/14/17 1825      PT LONG TERM GOAL #1   Title Patient will demonstrate improved function with daily tasks with minimal to no diffficulty as indicated by LEFS score of at least 60/80   Baseline 42/80   Status New   Target Date 08/25/17     PT LONG TERM GOAL #2   Title Patient will be independent with home program of self management of symptoms, exercise  progression without assistance in order to transition to independent self management once discharged    Baseline no knowledge of appropirate exercises and progression   Status New   Target Date 08/25/17               Plan - 07/22/17 1907    Clinical Impression Statement Patient is improving with less pain in right thigh with exercises. She continues with point tenderness and decreased strength and will benefit from additional physical therapy intervention to achieve goals.    Rehab Potential Good   Clinical Impairments Affecting Rehab Potential (+)age, prior level of function(-)back pain with anterolithesis   PT Frequency 2x / week   PT Duration 6 weeks   PT Treatment/Interventions Electrical Stimulation;Cryotherapy;Moist Heat;Patient/family education;Neuromuscular re-education;Therapeutic exercise;Manual techniques; iontophoresis with dexamethasone 4mg /ml, dry needling, ultrasound   PT Next Visit Plan pain control modalities, manual techniques, progressive exercises    PT Home Exercise Plan stabilization exercies sitting, lying; hip and quad stretch over edge of bed   Consulted and Agree with Plan of Care Patient      Patient will benefit from skilled therapeutic intervention in order to improve the following deficits and impairments:  Decreased strength, Pain, Impaired perceived functional ability, Increased muscle spasms  Visit Diagnosis: Pain in right thigh - Plan: PT plan of care cert/re-cert  Pain in left thigh - Plan: PT plan of care cert/re-cert  Muscle weakness (generalized) - Plan: PT plan of care cert/re-cert     Problem List Patient Active Problem List   Diagnosis Date Noted  . Right leg pain 06/25/2017  . Vaginitis 03/22/2017  . Vaginal atrophy 03/22/2017  . Meniere's disease 08/17/2016  . Sensation of fullness in right ear 03/15/2016  . Colon cancer screening 08/11/2015  . Fatigue 06/03/2015  . Health care maintenance 06/03/2015  . Foot pain 08/13/2014   . Neck pain 02/05/2014  . Hypercholesterolemia 02/05/2014  . Back pain 12/05/2012  . Trigger finger 12/05/2012  . Essential hypertension, benign 12/05/2012    Jomarie Longs PT 07/23/2017, 2:51 PM  [facility] [address] [place], [address] Phone: [phone]   Fax:  [phone]  Name: Sara Perry MRN: [id number] Date of Birth: 02-21-1961

## 2017-07-27 ENCOUNTER — Encounter: Payer: Self-pay | Admitting: Physical Therapy

## 2017-07-27 ENCOUNTER — Ambulatory Visit: Payer: 59 | Admitting: Physical Therapy

## 2017-07-27 DIAGNOSIS — M6281 Muscle weakness (generalized): Secondary | ICD-10-CM | POA: Diagnosis not present

## 2017-07-27 DIAGNOSIS — M79652 Pain in left thigh: Secondary | ICD-10-CM | POA: Diagnosis not present

## 2017-07-27 DIAGNOSIS — M79651 Pain in right thigh: Secondary | ICD-10-CM | POA: Diagnosis not present

## 2017-07-28 NOTE — Telephone Encounter (Signed)
Mailed hard copy

## 2017-07-28 NOTE — Therapy (Signed)
[place] [facility] [address], [place], [address] Phone: [phone]   Fax:  [phone]  Physical Therapy Treatment  Patient Details  Name: Sara Perry MRN: [id number] Date of Birth: 04/25/1961 Referring Provider: Einar Pheasant MD  Encounter Date: 07/27/2017      PT End of Session - 07/27/17 1506    Visit Number 3   Number of Visits 12   Date for PT Re-Evaluation 08/25/17   PT Start Time 3532   PT Stop Time 1518   PT Time Calculation (min) 40 min   Activity Tolerance Patient tolerated treatment well   Behavior During Therapy [facility] for tasks assessed/performed      Past Medical History:  Diagnosis Date  . Eczema   . Frequent UTI   . H/O lymphadenopathy    With previous submandibular node removals.  . Hypertension   . Recurrent sinus infections     Past Surgical History:  Procedure Laterality Date  . Lymph Node Removal  1984  . NECK SURGERY  1977   H/O Right neck surgery secondary to a benign growth with when patient was 22 or 57 yo  . skin lesion extraction  2013   basal cell - Dr Phillip Heal  . Uterine Polyps Removed  2004    There were no vitals filed for this visit.      Subjective Assessment - 07/27/17 1501    Subjective Patient reports she is noticing improvement with decreased intensity in right thigh and not as frequent. She continues with throbbing and tenderness near ASIS    Pertinent History history of back pain with radation into legs began 2009, she had physical therapy with minimal imporvement, She also has been exercising and stretching with yoga   Limitations Other (comment)  bending, squatting, exercising   Patient Stated Goals patient would like to have pain in right leg relieved so she can return to prior level of function   Currently in Pain? Yes   Pain Score 2    Pain Location Leg   Pain Orientation Right   Pain Descriptors / Indicators Aching;Tightness   Pain Type  Acute pain   Pain Onset More than a month ago   Pain Frequency Intermittent       Objective: Palpation: right hip/thigh with point tenderness inferior/medial to ASIS and lateral aspect of right near greater trochanter Strength: decreased quadriceps contraction as compared to left   Treatment: Modalities: Electrical stimulation: Russian stim. 10/10 cycle applied (2) electrodes to quadriceps/VMO right LE with patient reclined with LE supported on pillow; pt. Performing quad sets with each cycle; goal muscle re education Iontophoresis with dexamethasone 4mg /ml @ 78ma*min applied to right anterior and lateral hip region with medium electrodes with instruction and made aware of precautions and possible reactions including redness, irritation of skin, tiny blistering; patient consented to treatment: 15 min. To apply: (23 min following application); no adverse reactions noted under any electrodes, mild pink color noted only  Patient response to treatment:  Improved motor control with repetition with estim.; patient reported decreased tenderness to very mild and able to walk with more hip extension without pain or discomfort in right hip.        PT Education - 07/27/17 1504    Education provided Yes   Education Details re assessed home management and insructed in use of iontophoresis with dexamethasone and precautions/possible reactions with patient verbal consent received   Person(s) Educated Patient   Methods Explanation;Demonstration  Comprehension Verbalized understanding             PT Long Term Goals - 07/14/17 1825      PT LONG TERM GOAL #1   Title Patient will demonstrate improved function with daily tasks with minimal to no diffficulty as indicated by LEFS score of at least 60/80   Baseline 42/80   Status New   Target Date 08/25/17     PT LONG TERM GOAL #2   Title Patient will be independent with home program of self management of symptoms, exercise progression without  assistance in order to transition to independent self management once discharged    Baseline no knowledge of appropirate exercises and progression   Status New   Target Date 08/25/17               Plan - 07/27/17 1506    Clinical Impression Statement Patient is improving with decreased pain in right LE following treatment and no adverse reactions noted   History and Personal Factors relevant to plan of care:     Rehab Potential Good   Clinical Impairments Affecting Rehab Potential (+)age, prior level of function(-)back pain with anterolithesis   PT Frequency 2x / week   PT Duration 6 weeks   PT Treatment/Interventions Electrical Stimulation;Cryotherapy;Moist Heat;Patient/family education;Neuromuscular re-education;Therapeutic exercise;Manual techniques;Iontophoresis 4mg /ml Dexamethasone;Dry needling;Ultrasound   PT Next Visit Plan pain control modalities, manual techniques, progressive exercises    PT Home Exercise Plan stabilization exercies sitting, lying       Patient will benefit from skilled therapeutic intervention in order to improve the following deficits and impairments:  Decreased strength, Pain, Impaired perceived functional ability, Increased muscle spasms  Visit Diagnosis: Pain in right thigh  Pain in left thigh  Muscle weakness (generalized)     Problem List Patient Active Problem List   Diagnosis Date Noted  . Right leg pain 06/25/2017  . Vaginitis 03/22/2017  . Vaginal atrophy 03/22/2017  . Meniere's disease 08/17/2016  . Sensation of fullness in right ear 03/15/2016  . Colon cancer screening 08/11/2015  . Fatigue 06/03/2015  . Health care maintenance 06/03/2015  . Foot pain 08/13/2014  . Neck pain 02/05/2014  . Hypercholesterolemia 02/05/2014  . Back pain 12/05/2012  . Trigger finger 12/05/2012  . Essential hypertension, benign 12/05/2012    Jomarie Longs PT 07/28/2017, 10:19 AM  [place] [facility] [address] [place], [address] Phone: [phone]   Fax:  [phone]  Name: Sara Perry MRN: [id number] Date of Birth: 1961-02-03

## 2017-07-29 ENCOUNTER — Ambulatory Visit: Payer: 59 | Admitting: Physical Therapy

## 2017-07-30 ENCOUNTER — Encounter: Payer: Self-pay | Admitting: Internal Medicine

## 2017-07-30 DIAGNOSIS — Z1211 Encounter for screening for malignant neoplasm of colon: Secondary | ICD-10-CM

## 2017-07-31 NOTE — Telephone Encounter (Signed)
Order placed for referral to GI 

## 2017-08-04 ENCOUNTER — Encounter: Payer: Self-pay | Admitting: Physical Therapy

## 2017-08-04 ENCOUNTER — Ambulatory Visit: Payer: 59 | Admitting: Physical Therapy

## 2017-08-04 DIAGNOSIS — M6281 Muscle weakness (generalized): Secondary | ICD-10-CM

## 2017-08-04 DIAGNOSIS — M79652 Pain in left thigh: Secondary | ICD-10-CM | POA: Diagnosis not present

## 2017-08-04 DIAGNOSIS — M79651 Pain in right thigh: Secondary | ICD-10-CM

## 2017-08-04 NOTE — Therapy (Signed)
[place] [facility] [address], [place], [address] Phone: [phone]   Fax:  [phone]  Physical Therapy Treatment  Patient Details  Name: Sara Perry MRN: [id number] Date of Birth: 1961-03-20 Referring Provider: Einar Pheasant MD  Encounter Date: 08/04/2017      PT End of Session - 08/04/17 1836    Visit Number 4   Number of Visits 12   Date for PT Re-Evaluation 08/25/17   PT Start Time 6789   PT Stop Time 1810   PT Time Calculation (min) 45 min   Activity Tolerance Patient tolerated treatment well   Behavior During Therapy [facility] for tasks assessed/performed      Past Medical History:  Diagnosis Date  . Eczema   . Frequent UTI   . H/O lymphadenopathy    With previous submandibular node removals.  . Hypertension   . Recurrent sinus infections     Past Surgical History:  Procedure Laterality Date  . Lymph Node Removal  1984  . NECK SURGERY  1977   H/O Right neck surgery secondary to a benign growth with when patient was 35 or 56 yo  . skin lesion extraction  2013   basal cell - Dr Phillip Heal  . Uterine Polyps Removed  2004    There were no vitals filed for this visit.      Subjective Assessment - 08/04/17 1807    Subjective Patient continues to see improvement with physical therapy treatment with at least 24 hours of relief of symptoms following iontophoresis. Today she is reporting more tightness mid thigh over quadriceps muscle. Marland Kitchen    Pertinent History history of back pain with radation into legs began 2009, she had physical therapy with minimal imporvement, She also has been exercising and stretching with yoga   Limitations Other (comment)  bending, squatting, exercising   Patient Stated Goals patient would like to have pain in right leg relieved so she can return to prior level of function   Currently in Pain? Yes   Pain Score 2    Pain Location Leg   Pain Orientation Right   Pain  Descriptors / Indicators Aching;Tightness   Pain Type Acute pain   Pain Onset More than a month ago   Pain Frequency Intermittent        Objective: Palpation: right hip/thigh with point tenderness lateral aspect of right near greater trochanter and along central quadriceps with moderate spasm palpable Strength: decreased quadriceps contraction as compared to left, improved from previous session   Treatment: Manual therapy: 8 min.: goal: muscle spasm, improve soft tissue elasticity STM performed along quadriceps right LE central/lateral region with compression technique and superficial techniques: decreased spasms to mild with less tenderness noted following STM  Modalities: Electrical stimulation: 20 min. Russian stim. 10/10 cycle applied (2) electrodes to quadriceps/VMO right LE with patient reclined with LE supported on pillow; pt. Performing quad sets with each cycle; goal muscle re education Iontophoresis with dexamethasone 4mg /ml @ 24ma*min applied to right lateral hip region and over spasm in quadriceps with medium electrodes with instruction and made aware of precautions and possible reactions including redness, irritation of skin, tiny blistering; patient consented to treatment: 15 min. To apply: (~20  min following application); no adverse reactions noted under any electrodes, mild pink color noted only  Patient response to treatment: Improved soft tissue elasticity with decreased spasm by at least 50% following treatment. Improved flexibility in quadriceps and able to standing and walk  without pain at end of session.            PT Education - 08/04/17 1755    Education provided Yes   Education Details re assessed home exercises, hip flexor stretch over edge of table   Person(s) Educated Patient   Methods Explanation;Verbal cues   Comprehension Verbalized understanding             PT Long Term Goals - 07/14/17 1825      PT LONG TERM GOAL #1   Title Patient  will demonstrate improved function with daily tasks with minimal to no diffficulty as indicated by LEFS score of at least 60/80   Baseline 42/80   Status New   Target Date 08/25/17     PT LONG TERM GOAL #2   Title Patient will be independent with home program of self management of symptoms, exercise progression without assistance in order to transition to independent self management once discharged    Baseline no knowledge of appropirate exercises and progression   Status New   Target Date 08/25/17               Plan - 08/04/17 1836    Clinical Impression Statement Patient improving with current treatment. Overall good carry over with decreasing pain in hip and decreasing spasm in quadriceps.   Rehab Potential Good   Clinical Impairments Affecting Rehab Potential (+)age, prior level of function(-)back pain with anterolithesis   PT Frequency 2x / week   PT Duration 6 weeks   PT Treatment/Interventions Electrical Stimulation;Cryotherapy;Moist Heat;Patient/family education;Neuromuscular re-education;Therapeutic exercise;Manual techniques;Iontophoresis 4mg /ml Dexamethasone;Dry needling;Ultrasound   PT Next Visit Plan pain control modalities, manual techniques, progressive exercises    PT Home Exercise Plan stabilization exercies sitting, lying       Patient will benefit from skilled therapeutic intervention in order to improve the following deficits and impairments:  Decreased strength, Pain, Impaired perceived functional ability, Increased muscle spasms  Visit Diagnosis: Pain in right thigh  Muscle weakness (generalized)     Problem List Patient Active Problem List   Diagnosis Date Noted  . Right leg pain 06/25/2017  . Vaginitis 03/22/2017  . Vaginal atrophy 03/22/2017  . Meniere's disease 08/17/2016  . Sensation of fullness in right ear 03/15/2016  . Colon cancer screening 08/11/2015  . Fatigue 06/03/2015  . Health care maintenance 06/03/2015  . Foot pain 08/13/2014   . Neck pain 02/05/2014  . Hypercholesterolemia 02/05/2014  . Back pain 12/05/2012  . Trigger finger 12/05/2012  . Essential hypertension, benign 12/05/2012    Jomarie Longs PT 08/05/2017, 6:02 PM  [place] [facility] [address] [place], [address] Phone: [phone]   Fax:  [phone]  Name: Sara Perry MRN: [id number] Date of Birth: [birth year]

## 2017-08-06 ENCOUNTER — Ambulatory Visit: Payer: 59 | Admitting: Physical Therapy

## 2017-08-06 ENCOUNTER — Encounter: Payer: Self-pay | Admitting: Physical Therapy

## 2017-08-06 DIAGNOSIS — M6281 Muscle weakness (generalized): Secondary | ICD-10-CM

## 2017-08-06 DIAGNOSIS — M79651 Pain in right thigh: Secondary | ICD-10-CM

## 2017-08-06 DIAGNOSIS — M79652 Pain in left thigh: Secondary | ICD-10-CM | POA: Diagnosis not present

## 2017-08-06 NOTE — Therapy (Signed)
[place] [facility] [address] [place], [address] Phone: [phone]   Fax:  [phone]  Physical Therapy Treatment  Patient Details  Name: Sara Perry MRN: [id number] Date of Birth: 09/03/1961 Referring Provider: Einar Pheasant MD  Encounter Date: 08/06/2017      PT End of Session - 08/06/17 1831    Visit Number 5   Number of Visits 12   Date for PT Re-Evaluation 08/25/17   PT Start Time 1941   PT Stop Time 1745   PT Time Calculation (min) 51 min   Activity Tolerance Patient tolerated treatment well   Behavior During Therapy [facility] for tasks assessed/performed      Past Medical History:  Diagnosis Date  . Eczema   . Frequent UTI   . H/O lymphadenopathy    With previous submandibular node removals.  . Hypertension   . Recurrent sinus infections     Past Surgical History:  Procedure Laterality Date  . Lymph Node Removal  1984  . NECK SURGERY  1977   H/O Right neck surgery secondary to a benign growth with when patient was 61 or 56 yo  . skin lesion extraction  2013   basal cell - Dr Phillip Heal  . Uterine Polyps Removed  2004    There were no vitals filed for this visit.      Subjective Assessment - 08/06/17 1655    Subjective Patient continues to see improvement with current treatment.   Pertinent History history of back pain with radation into legs began 2009, she had physical therapy with minimal imporvement, She also has been exercising and stretching with yoga   Limitations Other (comment)  bending, squatting, exercising   Patient Stated Goals patient would like to have pain in right leg relieved so she can return to prior level of function   Currently in Pain? Yes   Pain Score 2    Pain Location Leg   Pain Orientation Right   Pain Descriptors / Indicators Aching   Pain Type Acute pain   Pain Onset More than a month ago   Pain Frequency Intermittent      Objective: Palpation:  right hip/thigh with point tenderness lateral aspect near greater trochanter and along central quadriceps with moderate spasm palpable Strength: decreased quadriceps contraction as compared to left, improved from previous session   Treatment: Manual therapy: 8 min.: goal: muscle spasm, improve soft tissue elasticity STM performed along quadriceps right LE central/lateral region with compression technique and superficial techniques: decreased spasms to mild with less tenderness noted following STM  Modalities: Electrical stimulation: 20 min. Russian stim. 10/10 cycle applied (2) electrodes to quadriceps/VMO right LE with patient reclined with LE supported on pillow; pt. Performing quad sets with each cycle; goal muscle re education Iontophoresis with dexamethasone 4mg /ml @ 26ma*min applied to right lateral hip region and over spasm in quadriceps with large electrodes with instruction and made aware of precautions and possible reactions including redness, irritation of skin, tiny blistering; patient consented to treatment: 15 min. To apply: (~20  min following application); no adverse reactions noted under any electrodes, mild pink color noted only  Patient response to treatment: Improved soft tissue elasticity with decreased spasms by 50% following treatment. Improved quadriceps contraction with electrical stimulation.         PT Education - 08/07/17 1910    Education provided Yes   Education Details re assessed home program for stretches and self management of spasms and  right thigh tightness   Person(s) Educated Patient   Methods Explanation   Comprehension Verbalized understanding             PT Long Term Goals - 07/14/17 1825      PT LONG TERM GOAL #1   Title Patient will demonstrate improved function with daily tasks with minimal to no diffficulty as indicated by LEFS score of at least 60/80   Baseline 42/80   Status New   Target Date 08/25/17     PT LONG TERM GOAL #2    Title Patient will be independent with home program of self management of symptoms, exercise progression without assistance in order to transition to independent self management once discharged    Baseline no knowledge of appropirate exercises and progression   Status New   Target Date 08/25/17               Plan - 08/06/17 1831    Clinical Impression Statement progressing well with goals with improvement noted in quadriceps control and strength. She continues with spasms and limited flexibility and strength in right thigh and will benefit from physical therapy intervention.    Rehab Potential Good   Clinical Impairments Affecting Rehab Potential (+)age, prior level of function(-)back pain with anterolithesis   PT Frequency 2x / week   PT Duration 6 weeks   PT Treatment/Interventions Electrical Stimulation;Cryotherapy;Moist Heat;Patient/family education;Neuromuscular re-education;Therapeutic exercise;Manual techniques;Iontophoresis 4mg /ml Dexamethasone;Dry needling;Ultrasound   PT Next Visit Plan pain control modalities, manual techniques, progressive exercises    PT Home Exercise Plan stabilization exercies sitting, lying       Patient will benefit from skilled therapeutic intervention in order to improve the following deficits and impairments:  Decreased strength, Pain, Impaired perceived functional ability, Increased muscle spasms  Visit Diagnosis: Pain in right thigh  Muscle weakness (generalized)     Problem List Patient Active Problem List   Diagnosis Date Noted  . Right leg pain 06/25/2017  . Vaginitis 03/22/2017  . Vaginal atrophy 03/22/2017  . Meniere's disease 08/17/2016  . Sensation of fullness in right ear 03/15/2016  . Colon cancer screening 08/11/2015  . Fatigue 06/03/2015  . Health care maintenance 06/03/2015  . Foot pain 08/13/2014  . Neck pain 02/05/2014  . Hypercholesterolemia 02/05/2014  . Back pain 12/05/2012  . Trigger finger 12/05/2012  .  Essential hypertension, benign 12/05/2012    Jomarie Longs PT 08/07/2017, 7:15 PM  [place] [facility] [address], [place], [address] Phone: [phone]   Fax:  [phone]  Name: Sara Perry MRN: [id number] Date of Birth: 08-29-1961

## 2017-08-10 ENCOUNTER — Encounter: Payer: Self-pay | Admitting: Physical Therapy

## 2017-08-10 ENCOUNTER — Ambulatory Visit: Payer: 59 | Admitting: Physical Therapy

## 2017-08-10 DIAGNOSIS — M79651 Pain in right thigh: Secondary | ICD-10-CM

## 2017-08-10 DIAGNOSIS — M6281 Muscle weakness (generalized): Secondary | ICD-10-CM

## 2017-08-10 DIAGNOSIS — M79652 Pain in left thigh: Secondary | ICD-10-CM

## 2017-08-10 NOTE — Therapy (Signed)
[facility] [address] [place], [address] Phone: [phone]   Fax:  [phone]  Physical Therapy Treatment  Patient Details  Name: Sara Perry MRN: [id number] Date of Birth: Aug 27, 1961 Referring Provider: Einar Pheasant MD  Encounter Date: 08/10/2017      PT End of Session - 08/10/17 1831    Visit Number 6   Number of Visits 12   Date for PT Re-Evaluation 08/25/17   PT Start Time 1700   PT Stop Time 1750   PT Time Calculation (min) 50 min   Activity Tolerance Patient tolerated treatment well   Behavior During Therapy [facility] for tasks assessed/performed      Past Medical History:  Diagnosis Date  . Eczema   . Frequent UTI   . H/O lymphadenopathy    With previous submandibular node removals.  . Hypertension   . Recurrent sinus infections     Past Surgical History:  Procedure Laterality Date  . Lymph Node Removal  1984  . NECK SURGERY  1977   H/O Right neck surgery secondary to a benign growth with when patient was 42 or 56 yo  . skin lesion extraction  2013   basal cell - Dr Phillip Heal  . Uterine Polyps Removed  2004    There were no vitals filed for this visit.      Subjective Assessment - 08/10/17 1824    Subjective Patient reports seeing improvement with current treatment and is noticing improvement with less pain in her right hip and less stiffness in her quadriceps.    Pertinent History history of back pain with radation into legs began 2009, she had physical therapy with minimal imporvement, She also has been exercising and stretching with yoga   Limitations Other (comment)  bending, squatting, exercising   Patient Stated Goals patient would like to have pain in right leg relieved so she can return to prior level of function   Currently in Pain? Other (Comment)  tender along right quadriceps at proximal attachment and mid thigh   Pain Onset --      Objective: Palpation:  right hip/thigh with point tenderness anterior aspect of hip and along central quadriceps with mild to moderate spasm palpable Strength: decreased quadriceps contraction as compared to left, improved from previous session  Treatment: Manual therapy: 8 min.: goal: muscle spasm, improve soft tissue elasticity STM performed along quadriceps right LE central/lateral region with compression technique and superficial techniques: decreased spasms to mild with less tenderness noted following STM  Therapeutic exercise: patient performed with instruction, demonstration and verbal cuing of therapist: goal: independent with home exercises, self management, improve LEFS Isometric exercises for hip flexion, abduction, extension at wall demonstrated and patient performed hip flexion isometrics with stability ball in hook lying 10x each LE with 3 second holds  Modalities: Electrical stimulation: 20 min. Russian stim. 10/10 cycle applied (2) electrodes to quadriceps/VMO right LE with patient reclined with LE supported on pillow; pt. Performing quad sets with each cycle; goal muscle re education Iontophoresis with dexamethasone 4mg /ml @ 62ma*min applied to right lateral hip region and over spasm in quadriceps with large electrodes with instruction and made aware of precautions and possible reactions including redness, irritation of skin, tiny blistering; patient consented to treatment: 74min. To apply: (~20 min following application); no adverse reactions noted under any electrodes, mild pink color noted only  Patient response to treatment: patient demonstrated good technique with exercises with minimal demonstration and verbal cuing.  Improved soft tissue elasticity and  apsmas to mild and non tender following STM. Improved quadriceps control and muscle activation with estim. And no adverse reactions noted following iontophoresis treatment.           PT Education - 08/10/17 1830    Education provided Yes    Education Details HEP:  added isometric hip flexion with stability ball in hook lying, hip abduction and extension against wall    Person(s) Educated Patient   Methods Explanation;Demonstration;Verbal cues   Comprehension Verbalized understanding;Returned demonstration;Verbal cues required             PT Long Term Goals - 07/14/17 1825      PT LONG TERM GOAL #1   Title Patient will demonstrate improved function with daily tasks with minimal to no diffficulty as indicated by LEFS score of at least 60/80   Baseline 42/80   Status New   Target Date 08/25/17     PT LONG TERM GOAL #2   Title Patient will be independent with home program of self management of symptoms, exercise progression without assistance in order to transition to independent self management once discharged    Baseline no knowledge of appropirate exercises and progression   Status New   Target Date 08/25/17               Plan - 08/10/17 1832    Clinical Impression Statement Patient is progressing steadily towards goals with improvement noted in decreased pain, increasing strength and function with right LE. She continues to benefit from physical therapy intervention to further control pain and improve spasms to allow continued improvement with strength.   Rehab Potential Good   Clinical Impairments Affecting Rehab Potential (+)age, prior level of function(-)back pain with anterolithesis   PT Frequency 2x / week   PT Duration 6 weeks   PT Treatment/Interventions Electrical Stimulation;Cryotherapy;Moist Heat;Patient/family education;Neuromuscular re-education;Therapeutic exercise;Manual techniques;Iontophoresis 4mg /ml Dexamethasone;Dry needling;Ultrasound   PT Next Visit Plan pain control modalities, manual techniques, progressive exercises    PT Home Exercise Plan stabilization exercies sitting, lying       Patient will benefit from skilled therapeutic intervention in order to improve the following  deficits and impairments:  Decreased strength, Pain, Impaired perceived functional ability, Increased muscle spasms  Visit Diagnosis: Pain in right thigh  Muscle weakness (generalized)  Pain in left thigh     Problem List Patient Active Problem List   Diagnosis Date Noted  . Right leg pain 06/25/2017  . Vaginitis 03/22/2017  . Vaginal atrophy 03/22/2017  . Meniere's disease 08/17/2016  . Sensation of fullness in right ear 03/15/2016  . Colon cancer screening 08/11/2015  . Fatigue 06/03/2015  . Health care maintenance 06/03/2015  . Foot pain 08/13/2014  . Neck pain 02/05/2014  . Hypercholesterolemia 02/05/2014  . Back pain 12/05/2012  . Trigger finger 12/05/2012  . Essential hypertension, benign 12/05/2012    Jomarie Longs PT 08/10/2017, 6:36 PM  [facility] [address] [place], [address] Phone: [phone]   Fax:  [phone]  Name: Sara Perry MRN: [id number] Date of Birth: 09-19-1961

## 2017-08-13 ENCOUNTER — Ambulatory Visit: Payer: 59 | Attending: Internal Medicine | Admitting: Physical Therapy

## 2017-08-13 ENCOUNTER — Encounter: Payer: Self-pay | Admitting: Physical Therapy

## 2017-08-13 DIAGNOSIS — M79652 Pain in left thigh: Secondary | ICD-10-CM | POA: Diagnosis not present

## 2017-08-13 DIAGNOSIS — M6281 Muscle weakness (generalized): Secondary | ICD-10-CM | POA: Diagnosis not present

## 2017-08-13 DIAGNOSIS — M62838 Other muscle spasm: Secondary | ICD-10-CM | POA: Insufficient documentation

## 2017-08-13 DIAGNOSIS — M79651 Pain in right thigh: Secondary | ICD-10-CM | POA: Insufficient documentation

## 2017-08-14 NOTE — Therapy (Signed)
[facility] [address] [place], [address] Phone: [phone]   Fax:  [phone]  Physical Therapy Treatment  Patient Details  Name: Sara Perry MRN: [id number] Date of Birth: Mar 28, 1961 Referring Provider: Einar Pheasant MD  Encounter Date: 08/13/2017      PT End of Session - 08/13/17 1858    Visit Number 7   Number of Visits 12   Date for PT Re-Evaluation 08/25/17   PT Start Time 1324   PT Stop Time 1755   PT Time Calculation (min) 50 min   Activity Tolerance Patient tolerated treatment well   Behavior During Therapy [facility] for tasks assessed/performed      Past Medical History:  Diagnosis Date  . Eczema   . Frequent UTI   . H/O lymphadenopathy    With previous submandibular node removals.  . Hypertension   . Recurrent sinus infections     Past Surgical History:  Procedure Laterality Date  . Lymph Node Removal  1984  . NECK SURGERY  1977   H/O Right neck surgery secondary to a benign growth with when patient was 24 or 56 yo  . skin lesion extraction  2013   basal cell - Dr Phillip Heal  . Uterine Polyps Removed  2004    There were no vitals filed for this visit.      Subjective Assessment - 08/13/17 1706    Subjective patient reports she is now able to perform exercise in class with hip abduction with mild to no pain. She feels the therapy treatment is helping.    Pertinent History history of back pain with radation into legs began 2009, she had physical therapy with minimal imporvement, She also has been exercising and stretching with yoga   Limitations Other (comment)  bending, squatting, exercising   Patient Stated Goals patient would like to have pain in right leg relieved so she can return to prior level of function   Currently in Pain? Other (Comment)  tender and mild pulling in quadriceps      Objective: Palpation: right hip/thigh with point tenderness anterior aspect of  hip and along central quadriceps with mild to moderate spasm palpable  Treatment: Manual therapy: 15 min.: goal: muscle spasm, improve soft tissue elasticity STM performed along quadriceps right LE central/lateral region with compression technique and superficial techniques: decreased spasms to mild with less tenderness noted following STM   Modalities: Electrical stimulation: 20 min. Russian stim. 10/10 cycle applied (2) electrodes to quadriceps/VMO right LE with patient reclined with LE supported on pillow; pt. Performing quad sets with each cycle; goal muscle re education Iontophoresis with dexamethasone 4mg /ml @ 9ma*min applied to right lateral hip region and over spasm in quadriceps with largeelectrodes with instruction and made aware of precautions and possible reactions including redness, irritation of skin, tiny blistering; patient consented to treatment: 44min. To apply: (~20 min following application); no adverse reactions noted under any electrodes, mild pink color noted only  Patient response to treatment: Improved soft tissue elasticity and decreased spasm in quadriceps right LE by >50%. Improved quadriceps control and muscle activation with electrical stimulation             PT Education - 08/13/17 1800    Education provided Yes   Education Details re assessed home program   Person(s) Educated Patient   Methods Explanation   Comprehension Verbalized understanding             PT Long Term  Goals - 07/14/17 1825      PT LONG TERM GOAL #1   Title Patient will demonstrate improved function with daily tasks with minimal to no diffficulty as indicated by LEFS score of at least 60/80   Baseline 42/80   Status New   Target Date 08/25/17     PT LONG TERM GOAL #2   Title Patient will be independent with home program of self management of symptoms, exercise progression without assistance in order to transition to independent self management once discharged     Baseline no knowledge of appropirate exercises and progression   Status New   Target Date 08/25/17               Plan - 08/13/17 1859    Clinical Impression Statement Patient demonstrates improving function and able to exercise with improved flexibility in right LE hip. She continues with intermittent spasms and tightness and will benefit from continued physical therapy intervention to achieve goals.   Rehab Potential Good   Clinical Impairments Affecting Rehab Potential (+)age, prior level of function(-)back pain with anterolithesis   PT Frequency 2x / week   PT Duration 6 weeks   PT Treatment/Interventions Electrical Stimulation;Cryotherapy;Moist Heat;Patient/family education;Neuromuscular re-education;Therapeutic exercise;Manual techniques;Iontophoresis 4mg /ml Dexamethasone;Dry needling;Ultrasound   PT Next Visit Plan pain control modalities, manual techniques, progressive exercises    PT Home Exercise Plan stabilization exercies sitting, lying       Patient will benefit from skilled therapeutic intervention in order to improve the following deficits and impairments:  Decreased strength, Pain, Impaired perceived functional ability, Increased muscle spasms  Visit Diagnosis: Pain in right thigh  Muscle weakness (generalized)  Other muscle spasm     Problem List Patient Active Problem List   Diagnosis Date Noted  . Right leg pain 06/25/2017  . Vaginitis 03/22/2017  . Vaginal atrophy 03/22/2017  . Meniere's disease 08/17/2016  . Sensation of fullness in right ear 03/15/2016  . Colon cancer screening 08/11/2015  . Fatigue 06/03/2015  . Health care maintenance 06/03/2015  . Foot pain 08/13/2014  . Neck pain 02/05/2014  . Hypercholesterolemia 02/05/2014  . Back pain 12/05/2012  . Trigger finger 12/05/2012  . Essential hypertension, benign 12/05/2012    Jomarie Longs PT 08/14/2017, 4:56 PM  [facility] [address], [place], [address] Phone: [phone]   Fax:  [phone]  Name: Sara Perry MRN: [id number] Date of Birth: 1960/11/01

## 2017-08-18 ENCOUNTER — Ambulatory Visit: Payer: 59 | Admitting: Physical Therapy

## 2017-08-18 ENCOUNTER — Telehealth: Payer: Self-pay | Admitting: Gastroenterology

## 2017-08-18 ENCOUNTER — Encounter: Payer: Self-pay | Admitting: Physical Therapy

## 2017-08-18 DIAGNOSIS — M6281 Muscle weakness (generalized): Secondary | ICD-10-CM | POA: Diagnosis not present

## 2017-08-18 DIAGNOSIS — M62838 Other muscle spasm: Secondary | ICD-10-CM | POA: Diagnosis not present

## 2017-08-18 DIAGNOSIS — M79651 Pain in right thigh: Secondary | ICD-10-CM | POA: Diagnosis not present

## 2017-08-18 DIAGNOSIS — M79652 Pain in left thigh: Secondary | ICD-10-CM | POA: Diagnosis not present

## 2017-08-18 NOTE — Telephone Encounter (Signed)
Returned patients call to schedule colonoscopy with Dr. Bonna Gains.  LVM for her to call back.  Thanks [employer]

## 2017-08-18 NOTE — Telephone Encounter (Signed)
Patient called and is ready to schedule her screening colonoscopy.

## 2017-08-19 NOTE — Therapy (Signed)
[facility] [address], [place], [address] Phone: [phone]   Fax:  [phone]  Physical Therapy Treatment  Patient Details  Name: Sara Perry MRN: [id number] Date of Birth: [birth year] Referring Provider: Einar Pheasant MD   Encounter Date: 08/18/2017  PT End of Session - 08/18/17 1907    Visit Number  8    Number of Visits  12    Date for PT Re-Evaluation  08/25/17    PT Start Time  6384    PT Stop Time  1830    PT Time Calculation (min)  50 min    Activity Tolerance  Patient tolerated treatment well    Behavior During Therapy  [facility] for tasks assessed/performed       Past Medical History:  Diagnosis Date  . Eczema   . Frequent UTI   . H/O lymphadenopathy    With previous submandibular node removals.  . Hypertension   . Recurrent sinus infections     Past Surgical History:  Procedure Laterality Date  . Lymph Node Removal  1984  . NECK SURGERY  1977   H/O Right neck surgery secondary to a benign growth with when patient was 49 or 56 yo  . skin lesion extraction  2013   basal cell - Dr Phillip Heal  . Uterine Polyps Removed  2004    There were no vitals filed for this visit.  Subjective Assessment - 08/18/17 1741    Subjective  Patient reports much improved since beginning physical therapy treatment with less spasms and now able to perform exercises with less soreness. She continues with spasms palpable in right thigh and hip region.    Pertinent History  history of back pain with radation into legs began 2009, she had physical therapy with minimal imporvement, She also has been exercising and stretching with yoga    Limitations  Other (comment) bending, squatting, exercising   bending, squatting, exercising   Patient Stated Goals  patient would like to have pain in right leg relieved so she can return to prior level of function    Currently in Pain?  Other (Comment) spasms, pulling in  mid thigh and lateral hip right LE   spasms, pulling in mid thigh and lateral hip right LE        Objective: Palpation: right hip/thigh with point tenderness antero lateralaspect of hipand along central quadriceps, more proximal today with mild to moderate spasm palpable  Treatment: Manual therapy: 13 min.: goal: muscle spasm, improve soft tissue elasticity STM performed along quadriceps right LE central/lateral region with compression technique and superficial techniques: decreased spasms to mild with less tenderness noted following STM and decreased size of spasm, decreased thickness of soft tissue, improved elasticity   Modalities: Electrical stimulation: 20 min. Russian stim. 10/10 cycle applied (2) electrodes to quadriceps/VMO right LE with patient reclined with LE supported on pillow; pt. Performing quad sets with each cycle; goal muscle re education Iontophoresis with dexamethasone 4mg /ml @ 16ma*min applied to right lateral hip region and over spasm in quadriceps with largeelectrodes with instruction and made aware of precautions and possible reactions including redness, irritation of skin, tiny blistering; patient consented to treatment: 62min. To apply: (~22 min following application); no adverse reactions noted under any electrodes, mild pink color noted only  Patient response to treatment: decreased spasm to mild and decreased in size by 30% following STM. Improved quadriceps control and full contraction with estim. no adverse reaction noted with  iontophoresis.        PT Education - 08/18/17 1830    Education provided  Yes    Education Details  re assessed exercises, self managment of thigh tightness, spasms    Person(s) Educated  Patient    Methods  Explanation;Demonstration;Verbal cues    Comprehension  Verbalized understanding;Verbal cues required          PT Long Term Goals - 07/14/17 1825      PT LONG TERM GOAL #1   Title  Patient will demonstrate  improved function with daily tasks with minimal to no diffficulty as indicated by LEFS score of at least 60/80    Baseline  42/80    Status  New    Target Date  08/25/17      PT LONG TERM GOAL #2   Title  Patient will be independent with home program of self management of symptoms, exercise progression without assistance in order to transition to independent self management once discharged     Baseline  no knowledge of appropirate exercises and progression    Status  New    Target Date  08/25/17            Plan - 08/18/17 1835    Clinical Impression Statement  Patient demonstrates improvement right LE spasms, function, able to exercise with less pain, difficulty. She continues to benefit from iontophoresis and electrical stimulation to improve quadriceps and return to prior level of function.    Rehab Potential  Good    Clinical Impairments Affecting Rehab Potential  (+)age, prior level of function(-)back pain with anterolithesis    PT Frequency  2x / week    PT Duration  6 weeks    PT Treatment/Interventions  Electrical Stimulation;Cryotherapy;Moist Heat;Patient/family education;Neuromuscular re-education;Therapeutic exercise;Manual techniques;Iontophoresis 4mg /ml Dexamethasone;Dry needling;Ultrasound    PT Next Visit Plan  pain control modalities, manual techniques, progressive exercises     PT Home Exercise Plan  stabilization exercies sitting, lying, self STM        Patient will benefit from skilled therapeutic intervention in order to improve the following deficits and impairments:  Decreased strength, Pain, Impaired perceived functional ability, Increased muscle spasms  Visit Diagnosis: Pain in right thigh  Muscle weakness (generalized)  Other muscle spasm     Problem List Patient Active Problem List   Diagnosis Date Noted  . Right leg pain 06/25/2017  . Vaginitis 03/22/2017  . Vaginal atrophy 03/22/2017  . Meniere's disease 08/17/2016  . Sensation of fullness  in right ear 03/15/2016  . Colon cancer screening 08/11/2015  . Fatigue 06/03/2015  . Health care maintenance 06/03/2015  . Foot pain 08/13/2014  . Neck pain 02/05/2014  . Hypercholesterolemia 02/05/2014  . Back pain 12/05/2012  . Trigger finger 12/05/2012  . Essential hypertension, benign 12/05/2012    Jomarie Longs PT 08/19/2017, 10:52 AM  [facility] [address], [place], [address] Phone: [phone]   Fax:  [phone]  Name: Sara Perry MRN: [id number] Date of Birth: 06/11/1961

## 2017-08-20 ENCOUNTER — Other Ambulatory Visit: Payer: Self-pay

## 2017-08-20 ENCOUNTER — Telehealth: Payer: Self-pay

## 2017-08-20 ENCOUNTER — Telehealth: Payer: Self-pay | Admitting: Internal Medicine

## 2017-08-20 ENCOUNTER — Ambulatory Visit: Payer: 59 | Admitting: Physical Therapy

## 2017-08-20 DIAGNOSIS — M6281 Muscle weakness (generalized): Secondary | ICD-10-CM

## 2017-08-20 DIAGNOSIS — M62838 Other muscle spasm: Secondary | ICD-10-CM

## 2017-08-20 DIAGNOSIS — Z1211 Encounter for screening for malignant neoplasm of colon: Secondary | ICD-10-CM

## 2017-08-20 DIAGNOSIS — M79652 Pain in left thigh: Secondary | ICD-10-CM | POA: Diagnosis not present

## 2017-08-20 DIAGNOSIS — M79651 Pain in right thigh: Secondary | ICD-10-CM

## 2017-08-20 NOTE — Telephone Encounter (Unsigned)
Copied from [facility] [phone]. Topic: Inquiry >> Aug 20, 2017  5:00 PM Malena Catholic I, [place] wrote: Reason for CRM:Pt need a App  a soon es possible with Doctor Nicki Reaper please call the Pt as soon you have something Open Thank

## 2017-08-20 NOTE — Therapy (Addendum)
[facility] [address] [place], [address] Phone: [phone]   Fax:  [phone]  Physical Therapy Treatment  Patient Details  Name: Sara Perry MRN: [id number] Date of Birth: 03-03-1961 Referring Provider: Einar Pheasant MD   Encounter Date: 08/20/2017  PT End of Session - 08/20/17 1750    Visit Number  9    Number of Visits  12    Date for PT Re-Evaluation  08/25/17    PT Start Time  9518    PT Stop Time  1750    PT Time Calculation (min)  45 min    Activity Tolerance  Patient tolerated treatment well    Behavior During Therapy  [facility] for tasks assessed/performed       Past Medical History:  Diagnosis Date  . Eczema   . Frequent UTI   . H/O lymphadenopathy    With previous submandibular node removals.  . Hypertension   . Recurrent sinus infections     Past Surgical History:  Procedure Laterality Date  . Lymph Node Removal  1984  . NECK SURGERY  1977   H/O Right neck surgery secondary to a benign growth with when patient was 52 or 56 yo  . skin lesion extraction  2013   basal cell - Dr Phillip Heal  . Uterine Polyps Removed  2004    There were no vitals filed for this visit.   Subjective: Patient reports she is improving with therapy with decreasing spasms and pain in right thigh and hip. She continues with exacerbation of symptoms and is now able to exercise with less difficulty.  Pain: discomfort reported lateral right hip, tightness, spasms in right thigh   Objective: Palpation: right hip/thigh with point tenderness antero lateralaspect of hipand along central quadriceps  Treatment: Manual therapy:62min.: goal: muscle spasm, improve soft tissue elasticity STM performed along quadriceps right LE central/lateral region with compression technique and superficial techniques: decreased spasms to mild with less tenderness noted following STM and decreased size of  spasm  Modalities: Electrical stimulation: 20 min. Russian stim. 10/10 cycle applied (2) electrodes to quadriceps/VMO right LE with patient reclined with LE supported on pillow; pt. Performing quad sets with each cycle; goal muscle re education Iontophoresis with dexamethasone 4mg /ml @ 29ma*min applied to right lateral hip region and over spasm in quadriceps with largeelectrodes with instruction and made aware of precautions and possible reactions including redness, irritation of skin, tiny blistering; patient consented to treatment: 40min. To apply: (~20 min following application); no adverse reactions noted   Patient response to treatment:Patient demonstrated decreased spasms and improved soft tissue elasticity by 30% following STM. Full contraction achieved with estim.        PT Education - 08/20/17 1755    Education provided  Yes    Education Details  re assessed home program    Person(s) Educated  Patient    Methods  Explanation    Comprehension  Verbalized understanding          PT Long Term Goals - 07/14/17 1825      PT LONG TERM GOAL #1   Title  Patient will demonstrate improved function with daily tasks with minimal to no diffficulty as indicated by LEFS score of at least 60/80    Baseline  42/80    Status  New    Target Date  08/25/17      PT LONG TERM GOAL #2   Title  Patient will be independent  with home program of self management of symptoms, exercise progression without assistance in order to transition to independent self management once discharged     Baseline  no knowledge of appropirate exercises and progression    Status  New    Target Date  08/25/17            Plan - 08/20/17 1811    Clinical Impression Statement  Patient demonstrates improvement with decreased spasms and pain in right LE. good progress towards all goals.     Rehab Potential  Good    Clinical Impairments Affecting Rehab Potential  (+)age, prior level of function(-)back pain  with anterolithesis    PT Frequency  2x / week    PT Duration  6 weeks    PT Treatment/Interventions  Electrical Stimulation;Cryotherapy;Moist Heat;Patient/family education;Neuromuscular re-education;Therapeutic exercise;Manual techniques;Iontophoresis 4mg /ml Dexamethasone;Dry needling;Ultrasound    PT Next Visit Plan  pain control modalities, manual techniques, progressive exercises     PT Home Exercise Plan  stabilization exercies sitting, lying, self STM        Patient will benefit from skilled therapeutic intervention in order to improve the following deficits and impairments:  Decreased strength, Pain, Impaired perceived functional ability, Increased muscle spasms  Visit Diagnosis: Pain in right thigh  Muscle weakness (generalized)  Other muscle spasm     Problem List Patient Active Problem List   Diagnosis Date Noted  . Right leg pain 06/25/2017  . Vaginitis 03/22/2017  . Vaginal atrophy 03/22/2017  . Meniere's disease 08/17/2016  . Sensation of fullness in right ear 03/15/2016  . Colon cancer screening 08/11/2015  . Fatigue 06/03/2015  . Health care maintenance 06/03/2015  . Foot pain 08/13/2014  . Neck pain 02/05/2014  . Hypercholesterolemia 02/05/2014  . Back pain 12/05/2012  . Trigger finger 12/05/2012  . Essential hypertension, benign 12/05/2012    Jomarie Longs PT 08/21/2017, 12:13 PM  [facility] [address] [place], [address] Phone: [phone]   Fax:  [phone]  Name: Sara Perry MRN: [id number] Date of Birth: August 13, 1961

## 2017-08-20 NOTE — Telephone Encounter (Signed)
Gastroenterology Pre-Procedure Review  Request Date: 08/25/17 Requesting Physician: Dr. Allen Norris  PATIENT REVIEW QUESTIONS: The patient responded to the following health history questions as indicated:    1. Are you having any GI issues? no 2. Do you have a personal history of Polyps? no 3. Do you have a family history of Colon Cancer or Polyps? no 4. Diabetes Mellitus? no 5. Joint replacements in the past 12 months?no 6. Major health problems in the past 3 months?no 7. Any artificial heart valves, MVP, or defibrillator?no    MEDICATIONS & ALLERGIES:    Patient reports the following regarding taking any anticoagulation/antiplatelet therapy:   Plavix, Coumadin, Eliquis, Xarelto, Lovenox, Pradaxa, Brilinta, or Effient? no Aspirin? no  Patient confirms/reports the following medications:  Current Outpatient Medications  Medication Sig Dispense Refill  . Calcium Carbonate-Vitamin D (CALCIUM-VITAMIN D) 500-200 MG-UNIT per tablet Take 1 tablet by mouth daily.    Marland Kitchen estradiol (ESTRACE VAGINAL) 0.1 MG/GM vaginal cream One application q hs for 5 nights and then 2 x/week 42.5 g 1  . fish oil-omega-3 fatty acids 1000 MG capsule Take by mouth daily.    . fluocinonide (LIDEX) 0.05 % external solution APPLY TO AFFECTED AREA(S) AT BEDTIME 60 mL 0  . fluocinonide gel (LIDEX) 7.85 % Apply 1 application topically 2 (two) times daily. Apply to affected area twice a day as needed. 60 g 0  . meloxicam (MOBIC) 7.5 MG tablet TAKE 1 TABLET BY MOUTH DAILY AS NEEDED FOR PAIN. 30 tablet 0  . Multiple Vitamin (MULTIVITAMIN) tablet Take 1 tablet by mouth daily.    . nitrofurantoin, macrocrystal-monohydrate, (MACROBID) 100 MG capsule Take 1 capsule (100 mg total) by mouth 2 (two) times daily. 14 capsule 0  . telmisartan (MICARDIS) 20 MG tablet Take 1 tablet (20 mg total) by mouth daily. 30 tablet 5  . tizanidine (ZANAFLEX) 2 MG capsule Take 1 capsule (2 mg total) by mouth at bedtime as needed for muscle spasms. 20  capsule 0  . tretinoin (RETIN-A) 0.05 % cream Apply topically at bedtime. Apply to affected area three times a day as needed for Acne     No current facility-administered medications for this visit.     Patient confirms/reports the following allergies:  No Known Allergies  No orders of the defined types were placed in this encounter.   AUTHORIZATION INFORMATION Primary Insurance: 1D#: Group #:  Secondary Insurance: 1D#: Group #:  SCHEDULE INFORMATION: Date: 08/25/17 Time: Location:[facility]

## 2017-08-24 ENCOUNTER — Ambulatory Visit: Payer: 59 | Admitting: Physical Therapy

## 2017-08-25 ENCOUNTER — Ambulatory Visit
Admission: RE | Admit: 2017-08-25 | Discharge: 2017-08-25 | Disposition: A | Discharge status: Home / Self Care | Payer: 59 | Source: Ambulatory Visit | Attending: Gastroenterology | Admitting: Gastroenterology

## 2017-08-25 ENCOUNTER — Encounter: Payer: Self-pay | Admitting: *Deleted

## 2017-08-25 ENCOUNTER — Ambulatory Visit: Payer: 59 | Admitting: Anesthesiology

## 2017-08-25 ENCOUNTER — Encounter: Payer: Self-pay | Admitting: Internal Medicine

## 2017-08-25 ENCOUNTER — Encounter
Admission: RE | Disposition: A | Discharge status: Home / Self Care | Payer: Self-pay | Source: Ambulatory Visit | Attending: Gastroenterology

## 2017-08-25 DIAGNOSIS — D124 Benign neoplasm of descending colon: Secondary | ICD-10-CM | POA: Diagnosis not present

## 2017-08-25 DIAGNOSIS — K648 Other hemorrhoids: Secondary | ICD-10-CM | POA: Diagnosis not present

## 2017-08-25 DIAGNOSIS — Z1211 Encounter for screening for malignant neoplasm of colon: Secondary | ICD-10-CM

## 2017-08-25 DIAGNOSIS — K640 First degree hemorrhoids: Secondary | ICD-10-CM | POA: Insufficient documentation

## 2017-08-25 DIAGNOSIS — D122 Benign neoplasm of ascending colon: Secondary | ICD-10-CM | POA: Diagnosis not present

## 2017-08-25 DIAGNOSIS — I10 Essential (primary) hypertension: Secondary | ICD-10-CM | POA: Diagnosis not present

## 2017-08-25 DIAGNOSIS — D126 Benign neoplasm of colon, unspecified: Secondary | ICD-10-CM | POA: Diagnosis not present

## 2017-08-25 DIAGNOSIS — Z79899 Other long term (current) drug therapy: Secondary | ICD-10-CM | POA: Diagnosis not present

## 2017-08-25 DIAGNOSIS — K635 Polyp of colon: Secondary | ICD-10-CM | POA: Insufficient documentation

## 2017-08-25 HISTORY — PX: COLONOSCOPY WITH PROPOFOL: SHX5780

## 2017-08-25 SURGERY — COLONOSCOPY WITH PROPOFOL
Anesthesia: General

## 2017-08-25 MED ORDER — SODIUM CHLORIDE 0.9 % IV SOLN
INTRAVENOUS | Status: DC | PRN
  Administered 2017-08-25: 10:00:00 via INTRAVENOUS

## 2017-08-25 MED ORDER — PROPOFOL 500 MG/50ML IV EMUL
INTRAVENOUS | Status: AC
  Filled 2017-08-25: qty 50

## 2017-08-25 MED ORDER — LIDOCAINE HCL (PF) 2 % IJ SOLN
INTRAMUSCULAR | Status: DC | PRN
  Administered 2017-08-25: 50 mg via INTRADERMAL

## 2017-08-25 MED ORDER — PROPOFOL 500 MG/50ML IV EMUL
INTRAVENOUS | Status: DC | PRN
  Administered 2017-08-25: 120 ug/kg/min via INTRAVENOUS

## 2017-08-25 MED ORDER — SODIUM CHLORIDE 0.9 % IV SOLN
INTRAVENOUS | Status: DC
  Administered 2017-08-25: 10:00:00 via INTRAVENOUS

## 2017-08-25 MED ORDER — PROPOFOL 10 MG/ML IV BOLUS
INTRAVENOUS | Status: DC | PRN
  Administered 2017-08-25: 20 mg via INTRAVENOUS
  Administered 2017-08-25: 40 mg via INTRAVENOUS
  Administered 2017-08-25: 30 mg via INTRAVENOUS

## 2017-08-25 NOTE — Anesthesia Preprocedure Evaluation (Signed)
Anesthesia Evaluation  Patient identified by MRN, date of birth, ID band Patient awake    Reviewed: Allergy & Precautions, NPO status , Patient's Chart, lab work & pertinent test results  History of Anesthesia Complications Negative for: history of anesthetic complications  Airway Mallampati: II  TM Distance: >3 FB Neck ROM: Full    Dental  (+) Implants   Pulmonary neg pulmonary ROS, Sleep apnea: snores, but no other signs of OSA  , neg COPD,    breath sounds clear to auscultation- rhonchi (-) wheezing      Cardiovascular Exercise Tolerance: Good hypertension, Pt. on medications (-) CAD, (-) Past MI and (-) Cardiac Stents  Rhythm:Regular Rate:Normal - Systolic murmurs and - Diastolic murmurs    Neuro/Psych negative neurological ROS  negative psych ROS   GI/Hepatic negative GI ROS, Neg liver ROS,   Endo/Other  negative endocrine ROSneg diabetes  Renal/GU negative Renal ROS     Musculoskeletal negative musculoskeletal ROS (+)   Abdominal (+) - obese,   Peds  Hematology negative hematology ROS (+)   Anesthesia Other Findings Past Medical History: No date: Eczema No date: Frequent UTI No date: H/O lymphadenopathy     Comment:  With previous submandibular node removals. No date: Hypertension No date: Recurrent sinus infections   Reproductive/Obstetrics                             Anesthesia Physical Anesthesia Plan  ASA: II  Anesthesia Plan: General   Post-op Pain Management:    Induction: Intravenous  PONV Risk Score and Plan: 2 and Propofol infusion  Airway Management Planned: Natural Airway  Additional Equipment:   Intra-op Plan:   Post-operative Plan:   Informed Consent: I have reviewed the patients History and Physical, chart, labs and discussed the procedure including the risks, benefits and alternatives for the proposed anesthesia with the patient or authorized  representative who has indicated his/her understanding and acceptance.   Dental advisory given  Plan Discussed with: CRNA and Anesthesiologist  Anesthesia Plan Comments:         Anesthesia Quick Evaluation

## 2017-08-25 NOTE — Anesthesia Post-op Follow-up Note (Signed)
Anesthesia QCDR form completed.        

## 2017-08-25 NOTE — Op Note (Signed)
[facility] Gastroenterology Patient Name: Sara Perry Procedure Date: 08/25/2017 9:52 AM MRN: [id number] Account #: [id number] Date of Birth: 12-02-1960 Admit Type: Outpatient Age: 56 Room: [facility] ENDO ROOM 4 Gender: Female Note Status: Finalized Procedure:            Colonoscopy Indications:          Screening for colorectal malignant neoplasm Providers:            Lucilla Lame MD, MD Referring MD:         Einar Pheasant, MD (Referring MD) Medicines:            Propofol per Anesthesia Complications:        No immediate complications. Procedure:            Pre-Anesthesia Assessment:                       - Prior to the procedure, a History and Physical was                        performed, and patient medications and allergies were                        reviewed. The patient's tolerance of previous                        anesthesia was also reviewed. The risks and benefits of                        the procedure and the sedation options and risks were                        discussed with the patient. All questions were                        answered, and informed consent was obtained. Prior                        Anticoagulants: The patient has taken no previous                        anticoagulant or antiplatelet agents. ASA Grade                        Assessment: II - A patient with mild systemic disease.                        After reviewing the risks and benefits, the patient was                        deemed in satisfactory condition to undergo the                        procedure.                       After obtaining informed consent, the colonoscope was                        passed under direct vision. Throughout the procedure,  the patient's blood pressure, pulse, and oxygen                        saturations were monitored continuously. The Olympus                        CF-H180AL colonoscope ( S#: [id number] ) was introduced                        through the anus and advanced to the the cecum,                        identified by appendiceal orifice and ileocecal valve.                        The colonoscopy was performed without difficulty. The                        patient tolerated the procedure well. The quality of                        the bowel preparation was excellent. Findings:      The perianal and digital rectal examinations were normal.      A 9 mm polyp was found in the ascending colon. The polyp was sessile.       The polyp was removed with a hot snare. Resection and retrieval were       complete. To prevent bleeding after the polypectomy, one hemostatic clip       was successfully placed (MR conditional).      A 3 mm polyp was found in the descending colon. The polyp was sessile.       The polyp was removed with a cold biopsy forceps. Resection and       retrieval were complete.      Non-bleeding internal hemorrhoids were found during retroflexion. The       hemorrhoids were Grade I (internal hemorrhoids that do not prolapse). Impression:           - One 9 mm polyp in the ascending colon, removed with a                        hot snare. Resected and retrieved. Clip (MR                        conditional) was placed.                       - One 3 mm polyp in the descending colon, removed with                        a cold biopsy forceps. Resected and retrieved.                       - Non-bleeding internal hemorrhoids. Recommendation:       - Discharge patient to home.                       - Resume previous diet.                       - Continue  present medications.                       - Repeat colonoscopy in 5 years if polyp adenoma and 10                        years if hyperplastic Procedure Code(s):    --- Professional ---                       [id number], Colonoscopy, flexible; with removal of tumor(s),                        polyp(s), or other lesion(s) by snare technique                        45380, 40, Colonoscopy, flexible; with biopsy, single                        or multiple Diagnosis Code(s):    --- Professional ---                       Z12.11, Encounter for screening for malignant neoplasm                        of colon                       D12.2, Benign neoplasm of ascending colon                       D12.4, Benign neoplasm of descending colon CPT copyright 2016 American Medical Association. All rights reserved. The codes documented in this report are preliminary and upon coder review may  be revised to meet current compliance requirements. Lucilla Lame MD, MD 08/25/2017 10:15:28 AM This report has been signed electronically. Number of Addenda: 0 Note Initiated On: 08/25/2017 9:52 AM Scope Withdrawal Time: 0 hours 12 minutes 38 seconds  Total Procedure Duration: 0 hours 15 minutes 32 seconds       [facility]

## 2017-08-25 NOTE — Transfer of Care (Signed)
Immediate Anesthesia Transfer of Care Note  Patient: Josepha Barbier  Procedure(s) Performed: COLONOSCOPY WITH PROPOFOL (N/A )  Patient Location: Endoscopy Unit  Anesthesia Type:General  Level of Consciousness: awake and alert   Airway & Oxygen Therapy: Patient Spontanous Breathing and Patient connected to nasal cannula oxygen  Post-op Assessment: Report given to RN and Post -op Vital signs reviewed and stable  Post vital signs: Reviewed and stable  Last Vitals:  Vitals:   08/25/17 0912 08/25/17 1016  BP: (!) 130/91 107/73  Pulse: 94 87  Resp: (!) 22 (!) 22  Temp: 36.6 C (!) 35.9 C  SpO2: 100% 100%    Last Pain:  Vitals:   08/25/17 1016  TempSrc: Tympanic         Complications: No apparent anesthesia complications

## 2017-08-25 NOTE — H&P (Signed)
Lucilla Lame, MD [address] [place], [place] [address] Phone: [phone] Fax : [phone]  Primary Care Physician:  Einar Pheasant, MD Primary Gastroenterologist:  Dr. Allen Norris  Pre-Procedure History & Physical: HPI:  Sara Perry is a 56 y.o. female is here for a screening colonoscopy.   Past Medical History:  Diagnosis Date  . Eczema   . Frequent UTI   . H/O lymphadenopathy    With previous submandibular node removals.  . Hypertension   . Recurrent sinus infections     Past Surgical History:  Procedure Laterality Date  . Lymph Node Removal  1984  . NECK SURGERY  1977   H/O Right neck surgery secondary to a benign growth with when patient was 41 or 56 yo  . skin lesion extraction  2013   basal cell - Dr Phillip Heal  . Uterine Polyps Removed  2004    Prior to Admission medications   Medication Sig Start Date End Date Taking? Authorizing Provider  Calcium Carbonate-Vitamin D (CALCIUM-VITAMIN D) 500-200 MG-UNIT per tablet Take 1 tablet by mouth daily.   Yes [provider]  fish oil-omega-3 fatty acids 1000 MG capsule Take by mouth daily.   Yes [provider]  meloxicam (MOBIC) 7.5 MG tablet TAKE 1 TABLET BY MOUTH DAILY AS NEEDED FOR PAIN. 10/09/16  Yes Einar Pheasant, MD  Multiple Vitamin (MULTIVITAMIN) tablet Take 1 tablet by mouth daily.   Yes [provider]  telmisartan (MICARDIS) 20 MG tablet Take 1 tablet (20 mg total) by mouth daily. 10/09/16  Yes Einar Pheasant, MD  estradiol (ESTRACE VAGINAL) 0.1 MG/GM vaginal cream One application q hs for 5 nights and then 2 x/week 03/20/17   Einar Pheasant, MD  fluocinonide (LIDEX) 0.05 % external solution APPLY TO AFFECTED AREA(S) AT BEDTIME 08/28/16   Einar Pheasant, MD  fluocinonide gel (LIDEX) 3.23 % Apply 1 application topically 2 (two) times daily. Apply to affected area twice a day as needed. 08/07/13   Einar Pheasant, MD  nitrofurantoin, macrocrystal-monohydrate,  (MACROBID) 100 MG capsule Take 1 capsule (100 mg total) by mouth 2 (two) times daily. Patient not taking: Reported on 08/25/2017 06/30/17   Versie Starks, PA-C  tizanidine (ZANAFLEX) 2 MG capsule Take 1 capsule (2 mg total) by mouth at bedtime as needed for muscle spasms. Patient not taking: Reported on 08/25/2017 06/23/17   Einar Pheasant, MD  tretinoin (RETIN-A) 0.05 % cream Apply topically at bedtime. Apply to affected area three times a day as needed for Acne    [provider]    Allergies as of 08/20/2017  . (No Known Allergies)    Family History  Problem Relation Age of Onset  . Hyperlipidemia Mother   . Thyroid disease Mother   . Heart disease Mother        Heart Valve problems  . Coronary artery disease Father        Stents placed  . Hypertension Unknown        uncle  . Parkinson's disease Paternal Uncle   . Parkinson's disease Maternal Grandmother   . Heart disease Maternal Grandfather   . Heart disease Paternal Grandfather        MI  . Breast cancer Neg Hx     Social History   Socioeconomic History  . Marital status: Divorced    Spouse name: Not on file  . Number of children: 2  . Years of education: Not on file  . Highest education level: Not on  file  Social Needs  . Financial resource strain: Not on file  . Food insecurity - worry: Not on file  . Food insecurity - inability: Not on file  . Transportation needs - medical: Not on file  . Transportation needs - non-medical: Not on file  Occupational History    Employer: [employer]  Tobacco Use  . Smoking status: Never Smoker  . Smokeless tobacco: Never Used  Substance and Sexual Activity  . Alcohol use: Yes    Alcohol/week: 0.0 oz    Comment: Occasional  . Drug use: No  . Sexual activity: Not on file  Other Topics Concern  . Not on file  Social History Narrative  . Not on file    Review of Systems: See HPI, otherwise negative ROS  Physical Exam: BP 107/73   Pulse 87   Temp (!) 96.7 F  (35.9 C) (Tympanic)   Resp (!) 22   Ht 5\' 4"  (1.626 m)   Wt 116 lb (52.6 kg)   LMP 12/01/2008   SpO2 100%   BMI 19.91 kg/m  General:   Alert,  pleasant and cooperative in NAD Head:  Normocephalic and atraumatic. Neck:  Supple; no masses or thyromegaly. Lungs:  Clear throughout to auscultation.    Heart:  Regular rate and rhythm. Abdomen:  Soft, nontender and nondistended. Normal bowel sounds, without guarding, and without rebound.   Neurologic:  Alert and  oriented x4;  grossly normal neurologically.  Impression/Plan: Sara Perry is now here to undergo a screening colonoscopy.  Risks, benefits, and alternatives regarding colonoscopy have been reviewed with the patient.  Questions have been answered.  All parties agreeable.

## 2017-08-25 NOTE — Anesthesia Postprocedure Evaluation (Signed)
Anesthesia Post Note  Patient: Sara Perry  Procedure(s) Performed: COLONOSCOPY WITH PROPOFOL (N/A )  Patient location during evaluation: Endoscopy Anesthesia Type: General Level of consciousness: awake and alert and oriented Pain management: pain level controlled Vital Signs Assessment: post-procedure vital signs reviewed and stable Respiratory status: spontaneous breathing, nonlabored ventilation and respiratory function stable Cardiovascular status: blood pressure returned to baseline and stable Postop Assessment: no signs of nausea or vomiting Anesthetic complications: no     Last Vitals:  Vitals:   08/25/17 1030 08/25/17 1040  BP: 128/90 (!) 139/95  Pulse: 85 78  Resp: 17 (!) 21  Temp:    SpO2: 100% 100%    Last Pain:  Vitals:   08/25/17 1016  TempSrc: Tympanic                 [name]

## 2017-08-26 ENCOUNTER — Ambulatory Visit: Payer: 59 | Admitting: Physical Therapy

## 2017-08-26 ENCOUNTER — Encounter: Payer: Self-pay | Admitting: Gastroenterology

## 2017-08-26 ENCOUNTER — Other Ambulatory Visit: Payer: Self-pay

## 2017-08-26 DIAGNOSIS — M62838 Other muscle spasm: Secondary | ICD-10-CM

## 2017-08-26 DIAGNOSIS — M6281 Muscle weakness (generalized): Secondary | ICD-10-CM

## 2017-08-26 DIAGNOSIS — M79651 Pain in right thigh: Secondary | ICD-10-CM

## 2017-08-26 DIAGNOSIS — M79652 Pain in left thigh: Secondary | ICD-10-CM | POA: Diagnosis not present

## 2017-08-26 LAB — SURGICAL PATHOLOGY

## 2017-08-27 NOTE — Therapy (Signed)
[facility] [address] [place], [address] Phone: [phone]   Fax:  [phone]  Physical Therapy Treatment  Patient Details  Name: Sara Perry MRN: [id number] Date of Birth: 08/06/1961 Referring Provider: Einar Pheasant MD   Encounter Date: 08/26/2017  PT End of Session - 08/26/17 1731    Visit Number  10    Number of Visits  18    Date for PT Re-Evaluation  09/16/17    PT Start Time  7209    PT Stop Time  1750    PT Time Calculation (min)  45 min    Activity Tolerance  Patient tolerated treatment well    Behavior During Therapy  [facility] for tasks assessed/performed       Past Medical History:  Diagnosis Date  . Eczema   . Frequent UTI   . H/O lymphadenopathy    With previous submandibular node removals.  . Hypertension   . Recurrent sinus infections     Past Surgical History:  Procedure Laterality Date  . COLONOSCOPY WITH PROPOFOL N/A 08/25/2017   Procedure: COLONOSCOPY WITH PROPOFOL;  Surgeon: Lucilla Lame, MD;  Location: [facility] ENDOSCOPY;  Service: Endoscopy;  Laterality: N/A;  . Lymph Node Removal  1984  . NECK SURGERY  1977   H/O Right neck surgery secondary to a benign growth with when patient was 57 or 56 yo  . skin lesion extraction  2013   basal cell - Dr Phillip Heal  . Uterine Polyps Removed  2004    There were no vitals filed for this visit.  Subjective Assessment - 08/26/17 1710    Subjective  Patient reports she is having some discomfort lateral proximal thigh region. She has not been able to exercise as much as she would like since previous session. Overall she is continuing to notice good results with current physical therapy intervention.    Pertinent History  history of back pain with radation into legs began 2009, she had physical therapy with minimal imporvement, She also has been exercising and stretching with yoga    Limitations  Other (comment) bending, squatting,  exercising    Patient Stated Goals  patient would like to have pain in right leg relieved so she can return to prior level of function    Currently in Pain?  Other (Comment) discomfrot lateral proximal right thigh, tight, spasms mid thigh right          [facility] PT Assessment - 08/27/17 0001      Assessment   Medical Diagnosis  right leg pain, left leg pain    Referring Provider  Einar Pheasant MD    Onset Date/Surgical Date  04/12/17      Observation/Other Assessments   Lower Extremity Functional Scale   50/80        Objective: Palpation: point tender with spasms palpable along right thigh, quadriceps muscle and lateral aspect of right hip  Treatment: Manual therapy:35min.: goal: muscle spasm, improve soft tissue elasticity STM performed along quadriceps right LE central/lateral region with compression technique and superficial techniques: decreased spasms to mild with less tenderness noted following STMand decreased size of spasm, decreased thickness of soft tissue, improved elasticity  Modalities: Electrical stimulation: 20 min. Russian stim. 10/10 cycle applied (2) electrodes to quadriceps/VMO right LE with patient reclined with LE supported on pillow; pt. Performing quad sets with each cycle; goal muscle re education Iontophoresis with dexamethasone 4mg /ml @ 54ma*min applied to right lateral hip region and  over spasm in quadriceps with largeelectrodes with instruction and made aware of precautions and possible reactions including redness, irritation of skin, tiny blistering; patient consented to treatment: 66min. To apply: (~25KYH following application); no adverse reactions noted under any electrodes, mild pink color noted only  Patient response to treatment:Improved soft tissue elasticity with decreased tenderness along quadriceps and lateral aspect of right hip to mild following STM. Full contraction of quadricpes with estim. No adverse reaction with modalities noted.       PT Education - 08/26/17 1720    Education provided  Yes    Education Details  self management of spasms and continue with exercises     Person(s) Educated  Patient    Methods  Explanation;Demonstration;Verbal cues    Comprehension  Verbalized understanding          PT Long Term Goals - 08/26/17 1800      PT LONG TERM GOAL #1   Title  Patient will demonstrate improved function with daily tasks with minimal to no diffficulty as indicated by LEFS score of at least 60/80    Baseline  42/80; 08/26/17 50/80    Status  Revised    Target Date  09/16/17      PT LONG TERM GOAL #2   Title  Patient will be independent with home program of self management of symptoms, exercise progression without assistance in order to transition to independent self management once discharged     Baseline  no knowledge of appropirate exercises and progression; progressing with less cuing and instruction, requires guidance     Status  Revised    Target Date  09/16/17            Plan - 08/26/17 1800    Clinical Impression Statement  Patient is making steady progress with decreasing pain and improving soft tissue elasticity right LE which allows her to perform her daily tasks and exercise with less difficulty. She continues wtih intermittent exacerbation of symptoms and spasm and will beneift from additional physical therapy intervention to achieve macimal function and be able to transition to independent self management.     Rehab Potential  Good    Clinical Impairments Affecting Rehab Potential  (+)age, prior level of function(-)back pain with anterolithesis    PT Frequency  2x / week    PT Duration  6 weeks    PT Treatment/Interventions  Electrical Stimulation;Cryotherapy;Moist Heat;Patient/family education;Neuromuscular re-education;Therapeutic exercise;Manual techniques;Iontophoresis 4mg /ml Dexamethasone;Dry needling;Ultrasound    PT Next Visit Plan  pain control modalities, manual techniques,  progressive exercises     PT Home Exercise Plan  stabilization exercies sitting, lying, self STM        Patient will benefit from skilled therapeutic intervention in order to improve the following deficits and impairments:  Decreased strength, Pain, Impaired perceived functional ability, Increased muscle spasms  Visit Diagnosis: Pain in right thigh - Plan: PT plan of care cert/re-cert  Muscle weakness (generalized) - Plan: PT plan of care cert/re-cert  Other muscle spasm - Plan: PT plan of care cert/re-cert     Problem List Patient Active Problem List   Diagnosis Date Noted  . Special screening for malignant neoplasms, colon   . Benign neoplasm of descending colon   . Benign neoplasm of ascending colon   . Right leg pain 06/25/2017  . Vaginitis 03/22/2017  . Vaginal atrophy 03/22/2017  . Meniere's disease 08/17/2016  . Sensation of fullness in right ear 03/15/2016  . Colon cancer screening 08/11/2015  . Fatigue 06/03/2015  .  Health care maintenance 06/03/2015  . Foot pain 08/13/2014  . Neck pain 02/05/2014  . Hypercholesterolemia 02/05/2014  . Back pain 12/05/2012  . Trigger finger 12/05/2012  . Essential hypertension, benign 12/05/2012    Jomarie Longs PT 08/27/2017, 3:48 PM  [facility] [facility] [facility] [address], [place], [address] Phone: [phone]   Fax:  [phone]  Name: Sara Perry MRN: [id number] Date of Birth: 21-Jan-1961

## 2017-08-31 ENCOUNTER — Encounter: Payer: Self-pay | Admitting: Physical Therapy

## 2017-08-31 ENCOUNTER — Ambulatory Visit: Payer: 59 | Admitting: Physical Therapy

## 2017-08-31 ENCOUNTER — Other Ambulatory Visit: Payer: Self-pay

## 2017-08-31 DIAGNOSIS — M6281 Muscle weakness (generalized): Secondary | ICD-10-CM | POA: Diagnosis not present

## 2017-08-31 DIAGNOSIS — M79652 Pain in left thigh: Secondary | ICD-10-CM | POA: Diagnosis not present

## 2017-08-31 DIAGNOSIS — M62838 Other muscle spasm: Secondary | ICD-10-CM | POA: Diagnosis not present

## 2017-08-31 DIAGNOSIS — M79651 Pain in right thigh: Secondary | ICD-10-CM

## 2017-09-01 NOTE — Therapy (Signed)
[facility] [address], [place], [address] Phone: [phone]   Fax:  [phone]  Physical Therapy Treatment  Patient Details  Name: Sara Perry MRN: [id number] Date of Birth: Jul 07, 1961 Referring Provider: Einar Pheasant MD   Encounter Date: 08/31/2017  PT End of Session - 08/31/17 1810    Visit Number  11    Number of Visits  18    Date for PT Re-Evaluation  09/16/17    PT Start Time  0175    PT Stop Time  1753    PT Time Calculation (min)  46 min    Activity Tolerance  Patient tolerated treatment well    Behavior During Therapy  [facility] for tasks assessed/performed       Past Medical History:  Diagnosis Date  . Eczema   . Frequent UTI   . H/O lymphadenopathy    With previous submandibular node removals.  . Hypertension   . Recurrent sinus infections     Past Surgical History:  Procedure Laterality Date  . COLONOSCOPY WITH PROPOFOL N/A 08/25/2017   Procedure: COLONOSCOPY WITH PROPOFOL;  Surgeon: Lucilla Lame, MD;  Location: [facility] ENDOSCOPY;  Service: Endoscopy;  Laterality: N/A;  . Lymph Node Removal  1984  . NECK SURGERY  1977   H/O Right neck surgery secondary to a benign growth with when patient was 56 or 56 yo  . skin lesion extraction  2013   basal cell - Dr Phillip Heal  . Uterine Polyps Removed  2004    There were no vitals filed for this visit.  Subjective Assessment - 08/31/17 1708    Subjective  Patient reports she is having some discomfort lateral proximal thigh region.  Overall she is continuing to notice good results with current physical therapy intervention.    Pertinent History  history of back pain with radation into legs began 2009, she had physical therapy with minimal imporvement, She also has been exercising and stretching with yoga    Limitations  Other (comment) bending, squatting, exercising    Patient Stated Goals  patient would like to have pain in right leg  relieved so she can return to prior level of function    Currently in Pain?  Other (Comment) tender, sore lateral aspect proximal thigh, outer aspect       Objective: Palpation: point tender with spasms palpable along right thigh, quadriceps muscle and lateral aspect of right hip  Treatment: Manual therapy:71min.: goal: muscle spasm, improve soft tissue elasticity STM performed along quadriceps right LE central/lateral region with compression technique and superficial techniques: decreased spasms to mild with less tenderness noted following STMand decreased size of spasm, decreased thickness of soft tissue, improved elasticity  Modalities: Electrical stimulation: 20 min. Russian stim. 10/10 cycle applied (2) electrodes to quadriceps/VMO right LE with patient reclined with LE supported on pillow; pt. Performing quad sets with each cycle; goal muscle re education Iontophoresis with dexamethasone 4mg /ml @ 32ma*min applied to right lateral hip region and over spasm in quadriceps with largeelectrodes with instruction and made aware of precautions and possible reactions including redness, irritation of skin, tiny blistering; patient consented to treatment: 63min. To apply: (~10CHE following application); no adverse reactions noted under any electrodes, mild pink color noted only  Patient response to treatment:improved soft tissue elasticity with decreased spasms in right quadriceps by 50%. Improved pain level to mild lateral aspect of right hip. full quadriceps contraction with estim.       PT  Education - 08/31/17 1800    Education provided  Yes    Education Details  self management for right LE exercise    Person(s) Educated  Patient    Methods  Explanation    Comprehension  Verbalized understanding          PT Long Term Goals - 08/26/17 1800      PT LONG TERM GOAL #1   Title  Patient will demonstrate improved function with daily tasks with minimal to no diffficulty as  indicated by LEFS score of at least 60/80    Baseline  42/80; 08/26/17 50/80    Status  Revised    Target Date  09/16/17      PT LONG TERM GOAL #2   Title  Patient will be independent with home program of self management of symptoms, exercise progression without assistance in order to transition to independent self management once discharged     Baseline  no knowledge of appropirate exercises and progression; progressing with less cuing and instruction, requires guidance     Status  Revised    Target Date  09/16/17            Plan - 08/31/17 1821    Clinical Impression Statement  Patient continues to progress steadily towards goals with improvement noted in soft tissue mobility, decreasing spasms and improving function with daily tasks and exercise.    Rehab Potential  Good    Clinical Impairments Affecting Rehab Potential  (+)56, prior level of function(-)back pain with anterolithesis    PT Frequency  2x / week    PT Duration  6 weeks    PT Treatment/Interventions  Electrical Stimulation;Cryotherapy;Moist Heat;Patient/family education;Neuromuscular re-education;Therapeutic exercise;Manual techniques;Iontophoresis 4mg /ml Dexamethasone;Dry needling;Ultrasound    PT Next Visit Plan  pain control modalities, manual techniques, progressive exercises     PT Home Exercise Plan  stabilization exercies sitting, lying, self STM        Patient will benefit from skilled therapeutic intervention in order to improve the following deficits and impairments:  Decreased strength, Pain, Impaired perceived functional ability, Increased muscle spasms  Visit Diagnosis: Pain in right thigh  Muscle weakness (generalized)  Other muscle spasm     Problem List Patient Active Problem List   Diagnosis Date Noted  . Special screening for malignant neoplasms, colon   . Benign neoplasm of descending colon   . Benign neoplasm of ascending colon   . Right leg pain 06/25/2017  . Vaginitis 03/22/2017   . Vaginal atrophy 03/22/2017  . Meniere's disease 08/17/2016  . Sensation of fullness in right ear 03/15/2016  . Colon cancer screening 08/11/2015  . Fatigue 06/03/2015  . Health care maintenance 06/03/2015  . Foot pain 08/13/2014  . Neck pain 02/05/2014  . Hypercholesterolemia 02/05/2014  . Back pain 12/05/2012  . Trigger finger 12/05/2012  . Essential hypertension, benign 12/05/2012    Jomarie Longs PT 09/01/2017, 11:27 PM  [place] [facility] [address], [place], [address] Phone: [phone]   Fax:  [phone]  Name: Noga Fogg MRN: [id number] Date of Birth: 14-Jun-1961

## 2017-09-02 ENCOUNTER — Ambulatory Visit: Payer: 59 | Admitting: Physical Therapy

## 2017-09-02 ENCOUNTER — Encounter: Payer: Self-pay | Admitting: Physical Therapy

## 2017-09-02 DIAGNOSIS — M79651 Pain in right thigh: Secondary | ICD-10-CM | POA: Diagnosis not present

## 2017-09-02 DIAGNOSIS — M6281 Muscle weakness (generalized): Secondary | ICD-10-CM

## 2017-09-02 DIAGNOSIS — M62838 Other muscle spasm: Secondary | ICD-10-CM | POA: Diagnosis not present

## 2017-09-02 DIAGNOSIS — M79652 Pain in left thigh: Secondary | ICD-10-CM | POA: Diagnosis not present

## 2017-09-03 NOTE — Therapy (Signed)
[facility] [address] [place], [address] Phone: [phone]   Fax:  [phone]  Physical Therapy Treatment  Patient Details  Name: Sara Perry MRN: [id number] Date of Birth: May 15, 1961 Referring Provider: Einar Pheasant MD   Encounter Date: 09/02/2017  PT End of Session - 09/02/17 1745    Visit Number  12    Number of Visits  18    Date for PT Re-Evaluation  09/16/17    PT Start Time  6734    PT Stop Time  1750    PT Time Calculation (min)  46 min    Activity Tolerance  Patient tolerated treatment well    Behavior During Therapy  [facility] for tasks assessed/performed       Past Medical History:  Diagnosis Date  . Eczema   . Frequent UTI   . H/O lymphadenopathy    With previous submandibular node removals.  . Hypertension   . Recurrent sinus infections     Past Surgical History:  Procedure Laterality Date  . COLONOSCOPY WITH PROPOFOL N/A 08/25/2017   Procedure: COLONOSCOPY WITH PROPOFOL;  Surgeon: Lucilla Lame, MD;  Location: [facility] ENDOSCOPY;  Service: Endoscopy;  Laterality: N/A;  . Lymph Node Removal  1984  . NECK SURGERY  1977   H/O Right neck surgery secondary to a benign growth with when patient was 66 or 56 yo  . skin lesion extraction  2013   basal cell - Dr Phillip Heal  . Uterine Polyps Removed  2004    There were no vitals filed for this visit.    Objective: Palpation:point tender with mild to moderate spasms palpable along right thigh, quadriceps muscle and lateral aspect of right hip  Treatment: Manual therapy:28min.: goal: muscle spasm, improve soft tissue elasticity STM performed along quadriceps right LE central/lateral region with compression technique and superficial techniques: decreased spasms to mild with less tenderness noted following STMand decreased size of spasm, decreased thickness of soft tissue, improved elasticity  Modalities: Electrical  stimulation: 20 min. Russian stim. 10/10 cycle applied (2) electrodes to quadriceps/VMO right LE with patient reclined with LE supported on pillow; pt. Performing quad sets with each cycle; goal muscle re education Iontophoresis with dexamethasone 4mg /ml @ 82ma*min applied to right lateral hip region and over spasm in quadriceps with largeelectrodes with instruction and made aware of precautions and possible reactions including redness, irritation of skin, tiny blistering; patient consented to treatment:81min. To apply: (~19FXT following application); no adverse reactions noted under any electrodes  Patient response to treatment:iimproved soft tissue elasticityin right quadriceps with decreased spasms to mild following STM. Improved pain level to mild lateral aspect of right hip. full quadriceps contraction with estim.        PT Education - 09/02/17 1730    Education provided  Yes    Education Details  re assessed exercises    Person(s) Educated  Patient    Methods  Explanation;Verbal cues    Comprehension  Verbalized understanding          PT Long Term Goals - 08/26/17 1800      PT LONG TERM GOAL #1   Title  Patient will demonstrate improved function with daily tasks with minimal to no diffficulty as indicated by LEFS score of at least 60/80    Baseline  42/80; 08/26/17 50/80    Status  Revised    Target Date  09/16/17      PT LONG TERM GOAL #2  Title  Patient will be independent with home program of self management of symptoms, exercise progression without assistance in order to transition to independent self management once discharged     Baseline  no knowledge of appropirate exercises and progression; progressing with less cuing and instruction, requires guidance     Status  Revised    Target Date  09/16/17            Plan - 09/02/17 1740    Clinical Impression Statement  Patient continues demonstrating improvement with current treatment with decreasing  spasms, pain and improving function with daily tasks and exercise. She should continue to progress with additional physical therapy intervention in order to transition to independent self management.    Rehab Potential  Good    Clinical Impairments Affecting Rehab Potential  (+)age, prior level of function(-)back pain with anterolithesis    PT Frequency  2x / week    PT Duration  6 weeks    PT Treatment/Interventions  Electrical Stimulation;Cryotherapy;Moist Heat;Patient/family education;Neuromuscular re-education;Therapeutic exercise;Manual techniques;Iontophoresis 4mg /ml Dexamethasone;Dry needling;Ultrasound    PT Next Visit Plan  pain control modalities, manual techniques, progressive exercises     PT Home Exercise Plan  stabilization exercies sitting, lying, self STM        Patient will benefit from skilled therapeutic intervention in order to improve the following deficits and impairments:  Decreased strength, Pain, Impaired perceived functional ability, Increased muscle spasms  Visit Diagnosis: Pain in right thigh  Muscle weakness (generalized)  Other muscle spasm     Problem List Patient Active Problem List   Diagnosis Date Noted  . Special screening for malignant neoplasms, colon   . Benign neoplasm of descending colon   . Benign neoplasm of ascending colon   . Right leg pain 06/25/2017  . Vaginitis 03/22/2017  . Vaginal atrophy 03/22/2017  . Meniere's disease 08/17/2016  . Sensation of fullness in right ear 03/15/2016  . Colon cancer screening 08/11/2015  . Fatigue 06/03/2015  . Health care maintenance 06/03/2015  . Foot pain 08/13/2014  . Neck pain 02/05/2014  . Hypercholesterolemia 02/05/2014  . Back pain 12/05/2012  . Trigger finger 12/05/2012  . Essential hypertension, benign 12/05/2012    Jomarie Longs PT 09/03/2017, 9:16 PM  [place] [facility] [address], [place], [address] Phone:  [phone]   Fax:  [phone]  Name: Sara Perry MRN: [id number] Date of Birth: 29-May-1961

## 2017-09-07 ENCOUNTER — Ambulatory Visit: Payer: 59 | Admitting: Physical Therapy

## 2017-09-07 ENCOUNTER — Encounter: Payer: Self-pay | Admitting: Physical Therapy

## 2017-09-07 DIAGNOSIS — M79652 Pain in left thigh: Secondary | ICD-10-CM | POA: Diagnosis not present

## 2017-09-07 DIAGNOSIS — M79651 Pain in right thigh: Secondary | ICD-10-CM | POA: Diagnosis not present

## 2017-09-07 DIAGNOSIS — M62838 Other muscle spasm: Secondary | ICD-10-CM | POA: Diagnosis not present

## 2017-09-07 DIAGNOSIS — M6281 Muscle weakness (generalized): Secondary | ICD-10-CM

## 2017-09-08 NOTE — Therapy (Signed)
[facility] [address], [place], [address] Phone: [phone]   Fax:  [phone]  Physical Therapy Treatment  Patient Details  Name: Sara Perry MRN: [id number] Date of Birth: [birth year] Referring Provider: Einar Pheasant MD   Encounter Date: 09/07/2017  PT End of Session - 09/07/17 1800    Visit Number  13    Number of Visits  18    Date for PT Re-Evaluation  09/16/17    PT Start Time  1709    PT Stop Time  1755    PT Time Calculation (min)  46 min    Activity Tolerance  Patient tolerated treatment well    Behavior During Therapy  [facility] for tasks assessed/performed       Past Medical History:  Diagnosis Date  . Eczema   . Frequent UTI   . H/O lymphadenopathy    With previous submandibular node removals.  . Hypertension   . Recurrent sinus infections     Past Surgical History:  Procedure Laterality Date  . COLONOSCOPY WITH PROPOFOL N/A 08/25/2017   Procedure: COLONOSCOPY WITH PROPOFOL;  Surgeon: Lucilla Lame, MD;  Location: [facility] ENDOSCOPY;  Service: Endoscopy;  Laterality: N/A;  . Lymph Node Removal  1984  . NECK SURGERY  1977   H/O Right neck surgery secondary to a benign growth with when patient was 78 or 56 yo  . skin lesion extraction  2013   basal cell - Dr Phillip Heal  . Uterine Polyps Removed  2004    There were no vitals filed for this visit.  Subjective Assessment - 09/07/17 1718    Subjective  Patient reports she is having some discomfort in lateral aspect of hip. Overall she reports she is improving in right thigh with current treatment.    Pertinent History  history of back pain with radation into legs began 2009, she had physical therapy with minimal imporvement, She also has been exercising and stretching with yoga    Limitations  Other (comment) bending, squatting, exercising    Patient Stated Goals  patient would like to have pain in right leg relieved so she can return  to prior level of function    Currently in Pain?  Other (Comment) tender and sore in right thigh and outer asepect of hip         Objective: Palpation:tender with mild spasms palpable lateral right hip and quadriceps central region, decreased from previous session  Treatment: Manual therapy:47min.: goal: muscle spasm, improve soft tissue elasticity STM performed along quadriceps right LE central/lateral region with compression technique and superficial techniques: decreased spasms to mild with less tenderness noted following STMand decreased size of spasm, decreased thickness of soft tissue, improved elasticity  Modalities: Electrical stimulation: 20 min. Russian stim. 10/10 cycle applied (2) electrodes to quadriceps/VMO right LE with patient reclined with LE supported on pillow; pt. Performing quad sets with each cycle; goal muscle re education  Iontophoresis with dexamethasone 4mg /ml @ 43ma*min applied to right lateral hip region and over spasm in quadriceps with largeelectrodes with instruction and made aware of precautions and possible reactions including redness, irritation of skin, tiny blistering; patient consented to treatment:49min. To apply: (~28ZMO following application); no adverse reactions noted under any electrodes  Patient response to treatment:patient demonstrated improved soft tissue elasticity with decreased tenderness to non tender to palpation. Full quadriceps contraction achieved  with estim.       PT Education - 09/07/17 1730    Education  provided  Yes    Education Details  progress, exercises and stretches to continue at home including quadriceps and hip     Person(s) Educated  Patient    Methods  Explanation    Comprehension  Verbalized understanding          PT Long Term Goals - 08/26/17 1800      PT LONG TERM GOAL #1   Title  Patient will demonstrate improved function with daily tasks with minimal to no diffficulty as indicated by LEFS  score of at least 60/80    Baseline  42/80; 08/26/17 50/80    Status  Revised    Target Date  09/16/17      PT LONG TERM GOAL #2   Title  Patient will be independent with home program of self management of symptoms, exercise progression without assistance in order to transition to independent self management once discharged     Baseline  no knowledge of appropirate exercises and progression; progressing with less cuing and instruction, requires guidance     Status  Revised    Target Date  09/16/17            Plan - 09/07/17 1800    Clinical Impression Statement  Patient demonstrates good progress towards goals with decreasing pain and improvement with soft tissue elasticity right thigh and hip region. she is responding well to iontophoresis and soft tissue manipulation with self management at home with exercises. Plan to discharge following next session following re assessment.    Rehab Potential  Good    Clinical Impairments Affecting Rehab Potential  (+)age, prior level of function(-)back pain with anterolithesis    PT Frequency  2x / week    PT Duration  6 weeks    PT Treatment/Interventions  Electrical Stimulation;Cryotherapy;Moist Heat;Patient/family education;Neuromuscular re-education;Therapeutic exercise;Manual techniques;Iontophoresis 4mg /ml Dexamethasone;Dry needling;Ultrasound    PT Next Visit Plan  pain control modalities, manual techniques, progressive exercises; re assessment with anticipated discharge    PT Home Exercise Plan  stabilization exercies sitting, lying, self STM        Patient will benefit from skilled therapeutic intervention in order to improve the following deficits and impairments:  Decreased strength, Pain, Impaired perceived functional ability, Increased muscle spasms  Visit Diagnosis: Pain in right thigh  Other muscle spasm  Muscle weakness (generalized)     Problem List Patient Active Problem List   Diagnosis Date Noted  . Special  screening for malignant neoplasms, colon   . Benign neoplasm of descending colon   . Benign neoplasm of ascending colon   . Right leg pain 06/25/2017  . Vaginitis 03/22/2017  . Vaginal atrophy 03/22/2017  . Meniere's disease 08/17/2016  . Sensation of fullness in right ear 03/15/2016  . Colon cancer screening 08/11/2015  . Fatigue 06/03/2015  . Health care maintenance 06/03/2015  . Foot pain 08/13/2014  . Neck pain 02/05/2014  . Hypercholesterolemia 02/05/2014  . Back pain 12/05/2012  . Trigger finger 12/05/2012  . Essential hypertension, benign 12/05/2012    Jomarie Longs PT 09/08/2017, 10:00 PM [facility] [address], [place], [address] Phone: [phone]   Fax:  [phone]  Name: Sara Perry MRN: [id number] Date of Birth: 1961/07/04

## 2017-09-09 ENCOUNTER — Other Ambulatory Visit: Payer: Self-pay

## 2017-09-09 ENCOUNTER — Other Ambulatory Visit: Payer: Self-pay | Admitting: Internal Medicine

## 2017-09-09 ENCOUNTER — Encounter: Payer: Self-pay | Admitting: Physical Therapy

## 2017-09-09 ENCOUNTER — Ambulatory Visit: Payer: 59 | Admitting: Physical Therapy

## 2017-09-09 DIAGNOSIS — M6281 Muscle weakness (generalized): Secondary | ICD-10-CM | POA: Diagnosis not present

## 2017-09-09 DIAGNOSIS — M62838 Other muscle spasm: Secondary | ICD-10-CM

## 2017-09-09 DIAGNOSIS — M79651 Pain in right thigh: Secondary | ICD-10-CM | POA: Diagnosis not present

## 2017-09-09 DIAGNOSIS — M79652 Pain in left thigh: Secondary | ICD-10-CM

## 2017-09-09 NOTE — Therapy (Signed)
[facility] [address] [place], [address] Phone: [phone]   Fax:  [phone]  Physical Therapy Treatment and Discharge  Patient Details  Name: Sara Perry MRN: [id number] Date of Birth: September 28, 1961 Referring Provider: Einar Pheasant MD   Encounter Date: 09/09/2017  PT End of Session - 09/09/17 1712    Visit Number  14    Number of Visits  18    Date for PT Re-Evaluation  09/16/17    PT Start Time  1702    PT Stop Time  1759    PT Time Calculation (min)  57 min    Activity Tolerance  Patient tolerated treatment well    Behavior During Therapy  [facility] for tasks assessed/performed       Past Medical History:  Diagnosis Date  . Eczema   . Frequent UTI   . H/O lymphadenopathy    With previous submandibular node removals.  . Hypertension   . Recurrent sinus infections     Past Surgical History:  Procedure Laterality Date  . COLONOSCOPY WITH PROPOFOL N/A 08/25/2017   Procedure: COLONOSCOPY WITH PROPOFOL;  Surgeon: Lucilla Lame, MD;  Location: [facility] ENDOSCOPY;  Service: Endoscopy;  Laterality: N/A;  . Lymph Node Removal  1984  . NECK SURGERY  1977   H/O Right neck surgery secondary to a benign growth with when patient was 63 or 56 yo  . skin lesion extraction  2013   basal cell - Dr Phillip Heal  . Uterine Polyps Removed  2004    There were no vitals filed for this visit.  Subjective Assessment - 09/09/17 1707    Subjective  Pt reports she is feeling significant improvement compared to prior to therapy.  She has returned to doing her exercise class and has exercised at her gym with some stretching noted but no pain.  She is prepared for d/c from therapy today.     Pertinent History  history of back pain with radation into legs began 2009, she had physical therapy with minimal imporvement, She also has been exercising and stretching with yoga    Limitations  Other (comment) bending, squatting,  exercising    Patient Stated Goals  patient would like to have pain in right leg relieved so she can return to prior level of function    Currently in Pain?  No/denies    Multiple Pain Sites  No       Objective:  LEFS: 52/80    Treatment:  Manual therapy: goal: muscle spasm, improve soft tissue elasticity  STM performed along quadriceps right LE central/lateral region with compression technique and superficial techniques: decreased spasms to mild with less tenderness noted following STM and decreased size of spasm     Modalities:  Electrical stimulation: 15 min. Russian stim. 10/10 cycle applied (2) electrodes to quadriceps/VMO right LE with patient reclined with LE supported on pillow; goal muscle re education   Iontophoresis with dexamethasone 4mg /ml @ 67ma*min applied to right lateral hip region and over spasm in quadriceps with large electrodes with instruction and made aware of precautions and possible reactions including redness, irritation of skin, tiny blistering; patient consented to treatment: 72min. To apply: (~20 min following application); no adverse reactions noted        PT Education - 09/09/17 1711    Education provided  Yes    Education Details  Plan for d/c from therapy today, continue with HEP as prescribed, educated pt on the reasoning  for intervention techniques this session    Person(s) Educated  Patient    Methods  Explanation    Comprehension  Verbalized understanding          PT Long Term Goals - 09/09/17 1802      PT LONG TERM GOAL #1   Title  Patient will demonstrate improved function with daily tasks with minimal to no diffficulty as indicated by LEFS score of at least 60/80    Baseline  42/80; 08/26/17 50/80; 09/09/17: 52/80    Status  -- Adequate for discharge      PT LONG TERM GOAL #2   Title  Patient will be independent with home program of self management of symptoms, exercise progression without assistance in order to transition to  independent self management once discharged     Baseline  Pt is independent with HEP with no questions or concerns.  Discussion today about appropriate equipment to use at gym.     Status  Achieved            Plan - 09/09/17 1712    Clinical Impression Statement  Pt has reported significant improvement in symptoms since starting therapy and is prepared for and agreeable to d/c today. She denies any pain currently and reports she has been painfree with exercise class and gym routine over the past week.  Discussion today about continuing HEP upon discharge.  Pt made significant gains on her LEFS since initial PT Evaluation suggesting improved functional use of LE.  Pt will be discharged from therapy today.     Rehab Potential  Good    Clinical Impairments Affecting Rehab Potential  (+)age, prior level of function(-)back pain with anterolithesis    PT Frequency  -- d/c from therapy today    PT Treatment/Interventions  Electrical Stimulation;Cryotherapy;Moist Heat;Patient/family education;Neuromuscular re-education;Therapeutic exercise;Manual techniques;Iontophoresis 4mg /ml Dexamethasone;Dry needling;Ultrasound    PT Next Visit Plan  pain control modalities, manual techniques, progressive exercises; re assessment with anticipated discharge    PT Home Exercise Plan  stabilization exercies sitting, lying, self STM     Consulted and Agree with Plan of Care  Patient       Patient will benefit from skilled therapeutic intervention in order to improve the following deficits and impairments:  Decreased strength, Pain, Impaired perceived functional ability, Increased muscle spasms  Visit Diagnosis: Pain in right thigh  Other muscle spasm  Muscle weakness (generalized)  Pain in left thigh     Problem List Patient Active Problem List   Diagnosis Date Noted  . Special screening for malignant neoplasms, colon   . Benign neoplasm of descending colon   . Benign neoplasm of ascending colon    . Right leg pain 06/25/2017  . Vaginitis 03/22/2017  . Vaginal atrophy 03/22/2017  . Meniere's disease 08/17/2016  . Sensation of fullness in right ear 03/15/2016  . Colon cancer screening 08/11/2015  . Fatigue 06/03/2015  . Health care maintenance 06/03/2015  . Foot pain 08/13/2014  . Neck pain 02/05/2014  . Hypercholesterolemia 02/05/2014  . Back pain 12/05/2012  . Trigger finger 12/05/2012  . Essential hypertension, benign 12/05/2012    Collie Siad PT, DPT 09/09/2017, 6:14 PM  [facility] [address] [place], [address] Phone: [phone]   Fax:  [phone]  Name: Sara Perry MRN: [id number] Date of Birth: 02-27-1961

## 2017-11-19 ENCOUNTER — Ambulatory Visit (INDEPENDENT_AMBULATORY_CARE_PROVIDER_SITE_OTHER): Payer: 59 | Admitting: Internal Medicine

## 2017-11-19 ENCOUNTER — Encounter: Payer: Self-pay | Admitting: Internal Medicine

## 2017-11-19 VITALS — BP 110/76 | HR 88 | Temp 98.7°F | Resp 16 | Wt 119.8 lb

## 2017-11-19 DIAGNOSIS — I10 Essential (primary) hypertension: Secondary | ICD-10-CM | POA: Diagnosis not present

## 2017-11-19 DIAGNOSIS — Z1239 Encounter for other screening for malignant neoplasm of breast: Secondary | ICD-10-CM

## 2017-11-19 DIAGNOSIS — Z1231 Encounter for screening mammogram for malignant neoplasm of breast: Secondary | ICD-10-CM | POA: Diagnosis not present

## 2017-11-19 DIAGNOSIS — Z0000 Encounter for general adult medical examination without abnormal findings: Secondary | ICD-10-CM

## 2017-11-19 DIAGNOSIS — E7800 Pure hypercholesterolemia, unspecified: Secondary | ICD-10-CM

## 2017-11-19 DIAGNOSIS — M545 Low back pain, unspecified: Secondary | ICD-10-CM

## 2017-11-19 DIAGNOSIS — Z23 Encounter for immunization: Secondary | ICD-10-CM

## 2017-11-19 NOTE — Assessment & Plan Note (Signed)
Physical today 11/19/17.  PAP 08/14/16 - negative with negative HPV.  Mammogram 01/27/17- Birads I.  Schedule f/u mammogram.  Colonoscopy 08/25/17 - sessile serrated adenoma.  Recommended f/u in 5 years.

## 2017-11-19 NOTE — Progress Notes (Signed)
[name], female   DOB: 10/08/1961, 57 y.o.   MRN: [id number]   Subjective:    [name], female    DOB: 1961-01-21, 57 y.o.   MRN: [id number]  HPI  Patient here for her physical exam.  She reports she is doing well.  Feels good.  Stays active.  No chest pain.  No sob.  No acid reflux.  No abdominal pain.  Bowels moving.  No urine change. Taking her blood pressure medication - 1/2 tablet bid.  Blood pressure doing relatively well.  States highest reading - 130s/90.  Stress appears to be better.     Past Medical History:  Diagnosis Date  . Eczema   . Frequent UTI   . H/O lymphadenopathy    With previous submandibular node removals.  . Hypertension   . Recurrent sinus infections    Past Surgical History:  Procedure Laterality Date  . COLONOSCOPY WITH PROPOFOL N/A 08/25/2017   Procedure: COLONOSCOPY WITH PROPOFOL;  Surgeon: Lucilla Lame, MD;  Location: [facility] ENDOSCOPY;  Service: Endoscopy;  Laterality: N/A;  . Lymph Node Removal  1984  . NECK SURGERY  1977   H/O Right neck surgery secondary to a benign growth with when patient was 67 or 57 yo  . skin lesion extraction  2013   basal cell - Dr Phillip Heal  . Uterine Polyps Removed  2004   Family History  Problem Relation Age of Onset  . Hyperlipidemia Mother   . Thyroid disease Mother   . Heart disease Mother        Heart Valve problems  . Coronary artery disease Father        Stents placed  . Hypertension Unknown        uncle  . Parkinson's disease Paternal Uncle   . Parkinson's disease Maternal Grandmother   . Heart disease Maternal Grandfather   . Heart disease Paternal Grandfather        MI  . Breast cancer Neg Hx    Social History   Socioeconomic History  . Marital status: Divorced    Spouse name: None  . Number of children: 2  . Years of education: None  . Highest education level: None  Social Needs  . Financial resource strain: None  . Food insecurity - worry: None  .  Food insecurity - inability: None  . Transportation needs - medical: None  . Transportation needs - non-medical: None  Occupational History    Employer: [employer]  Tobacco Use  . Smoking status: Never Smoker  . Smokeless tobacco: Never Used  Substance and Sexual Activity  . Alcohol use: Yes    Alcohol/week: 0.0 oz    Comment: Occasional  . Drug use: No  . Sexual activity: None  Other Topics Concern  . None  Social History Narrative  . None    Outpatient Encounter Medications as of 11/19/2017  Medication Sig  . Calcium Carbonate-Vitamin D (CALCIUM-VITAMIN D) 500-200 MG-UNIT per tablet Take 1 tablet by mouth daily.  Marland Kitchen estradiol (ESTRACE VAGINAL) 0.1 MG/GM vaginal cream One application q hs for 5 nights and then 2 x/week  . fish oil-omega-3 fatty acids 1000 MG capsule Take by mouth daily.  . fluocinonide (LIDEX) 0.05 % external solution APPLY TO AFFECTED AREA(S) AT BEDTIME  . fluocinonide gel (LIDEX) 3.24 % Apply 1 application topically 2 (two) times daily. Apply to affected area twice a day as needed.  . meloxicam (MOBIC) 7.5 MG tablet TAKE 1 TABLET BY  MOUTH DAILY AS NEEDED FOR PAIN.  . Multiple Vitamin (MULTIVITAMIN) tablet Take 1 tablet by mouth daily.  Marland Kitchen telmisartan (MICARDIS) 20 MG tablet Take 1 tablet (20 mg total) by mouth daily.  Marland Kitchen telmisartan (MICARDIS) 20 MG tablet TAKE 1 TABLET (20 MG TOTAL) BY MOUTH DAILY.  Marland Kitchen tretinoin (RETIN-A) 0.05 % cream Apply topically at bedtime. Apply to affected area three times a day as needed for Acne  . [DISCONTINUED] nitrofurantoin, macrocrystal-monohydrate, (MACROBID) 100 MG capsule Take 1 capsule (100 mg total) by mouth 2 (two) times daily. (Patient not taking: Reported on 08/25/2017)  . [DISCONTINUED] tizanidine (ZANAFLEX) 2 MG capsule Take 1 capsule (2 mg total) by mouth at bedtime as needed for muscle spasms. (Patient not taking: Reported on 08/25/2017)   No facility-administered encounter medications on file as of 11/19/2017.     Review of  Systems  Constitutional: Negative for appetite change and unexpected weight change.  HENT: Negative for congestion and sinus pressure.   Eyes: Negative for pain and visual disturbance.  Respiratory: Negative for cough, chest tightness and shortness of breath.   Cardiovascular: Negative for chest pain, palpitations and leg swelling.  Gastrointestinal: Negative for abdominal pain, diarrhea and nausea.  Genitourinary: Negative for difficulty urinating and dysuria.  Musculoskeletal: Negative for joint swelling and myalgias.  Skin: Negative for color change and rash.  Neurological: Negative for dizziness, light-headedness and headaches.  Hematological: Negative for adenopathy. Does not bruise/bleed easily.  Psychiatric/Behavioral: Negative for agitation and dysphoric mood.       Objective:    Physical Exam  Constitutional: She is oriented to person, place, and time. She appears well-developed and well-nourished. No distress.  HENT:  Nose: Nose normal.  Mouth/Throat: Oropharynx is clear and moist.  Eyes: Right eye exhibits no discharge. Left eye exhibits no discharge. No scleral icterus.  Neck: Neck supple. No thyromegaly present.  Cardiovascular: Normal rate and regular rhythm.  Pulmonary/Chest: Breath sounds normal. No accessory muscle usage. No tachypnea. No respiratory distress. She has no decreased breath sounds. She has no wheezes. She has no rhonchi. Right breast exhibits no inverted nipple, no mass, no nipple discharge and no tenderness (no axillary adenopathy). Left breast exhibits no inverted nipple, no mass, no nipple discharge and no tenderness (no axilarry adenopathy).  Abdominal: Soft. Bowel sounds are normal. There is no tenderness.  Musculoskeletal: She exhibits no edema or tenderness.  Lymphadenopathy:    She has no cervical adenopathy.  Neurological: She is alert and oriented to person, place, and time.  Skin: Skin is warm. No rash noted. No erythema.  Psychiatric: She  has a normal mood and affect. Her behavior is normal.    BP 110/76 (BP Location: Left Arm, Patient Position: Sitting, Cuff Size: Normal)   Pulse 88   Temp 98.7 F (37.1 C) (Oral)   Resp 16   Wt 119 lb 12.8 oz (54.3 kg)   LMP 12/01/2008   SpO2 97%   BMI 20.56 kg/m  Wt Readings from Last 3 Encounters:  11/19/17 119 lb 12.8 oz (54.3 kg)  08/25/17 116 lb (52.6 kg)  06/23/17 116 lb 12.8 oz (53 kg)     Lab Results  Component Value Date   WBC 4.4 05/07/2016   HGB 13.5 05/07/2016   HCT 40.2 05/07/2016   PLT 286.0 05/07/2016   GLUCOSE 90 02/10/2017   CHOL 196 02/10/2017   TRIG 100.0 02/10/2017   HDL 65.30 02/10/2017   LDLDIRECT 148.9 08/15/2013   LDLCALC 111 (H) 02/10/2017   ALT 22  02/10/2017   AST 21 02/10/2017   NA 141 02/10/2017   K 4.7 02/10/2017   CL 104 02/10/2017   CREATININE 0.81 02/10/2017   BUN 14 02/10/2017   CO2 32 02/10/2017   TSH 1.42 09/16/2016       Assessment & Plan:   Problem List Items Addressed This Visit    Back pain    Stable.  No significant problems now.  Follow.       Essential hypertension, benign    Blood pressure as outlined.  Continue same medication regimen.  Follow pressures.  Follow metabolic panel.       Relevant Orders   CBC with Differential/Platelet   TSH   Basic metabolic panel   Health care maintenance    Physical today 11/19/17.  PAP 08/14/16 - negative with negative HPV.  Mammogram 01/27/17- Birads I.  Schedule f/u mammogram.  Colonoscopy 08/25/17 - sessile serrated adenoma.  Recommended f/u in 5 years.      Hypercholesterolemia    Low cholesterol diet and exercise.  Follow lipid panel.        Relevant Orders   Lipid panel   Hepatic function panel    Other Visit Diagnoses    Routine general medical examination at a health care facility    -  Primary   Breast cancer screening       Relevant Orders   MM SCREENING BREAST TOMO BILATERAL       Einar Pheasant, MD

## 2017-11-22 ENCOUNTER — Encounter: Payer: Self-pay | Admitting: Internal Medicine

## 2017-11-22 NOTE — Assessment & Plan Note (Signed)
Blood pressure as outlined.  Continue same medication regimen.  Follow pressures.  Follow metabolic panel.  

## 2017-11-22 NOTE — Assessment & Plan Note (Signed)
Stable.  No significant problems now.  Follow.

## 2017-11-22 NOTE — Assessment & Plan Note (Signed)
Low cholesterol diet and exercise.  Follow lipid panel.   

## 2017-12-17 ENCOUNTER — Other Ambulatory Visit (INDEPENDENT_AMBULATORY_CARE_PROVIDER_SITE_OTHER): Payer: 59

## 2017-12-17 DIAGNOSIS — I10 Essential (primary) hypertension: Secondary | ICD-10-CM

## 2017-12-17 DIAGNOSIS — E7800 Pure hypercholesterolemia, unspecified: Secondary | ICD-10-CM

## 2017-12-17 LAB — LIPID PANEL
CHOL/HDL RATIO: 3
CHOLESTEROL: 188 mg/dL (ref 0–200)
HDL: 63.3 mg/dL (ref >39.00)
LDL Cholesterol: 107 mg/dL — ABNORMAL HIGH (ref 0–99)
NonHDL: 124.48
TRIGLYCERIDES: 86 mg/dL (ref 0.0–149.0)
VLDL: 17.2 mg/dL (ref 0.0–40.0)

## 2017-12-17 LAB — BASIC METABOLIC PANEL
BUN: 18 mg/dL (ref 6–23)
CALCIUM: 10 mg/dL (ref 8.4–10.5)
CO2: 31 mEq/L (ref 19–32)
CREATININE: 0.82 mg/dL (ref 0.40–1.20)
Chloride: 101 mEq/L (ref 96–112)
GFR: 76.55 mL/min (ref >60.00)
Glucose, Bld: 83 mg/dL (ref 70–99)
Potassium: 4.3 mEq/L (ref 3.5–5.1)
SODIUM: 139 meq/L (ref 135–145)

## 2017-12-17 LAB — CBC WITH DIFFERENTIAL/PLATELET
BASOS PCT: 0.3 % (ref 0.0–3.0)
Basophils Absolute: 0 10*3/uL (ref 0.0–0.1)
EOS PCT: 1.2 % (ref 0.0–5.0)
Eosinophils Absolute: 0 10*3/uL (ref 0.0–0.7)
HCT: 38.7 % (ref 36.0–46.0)
Hemoglobin: 13.1 g/dL (ref 12.0–15.0)
LYMPHS ABS: 1.4 10*3/uL (ref 0.7–4.0)
Lymphocytes Relative: 37.3 % (ref 12.0–46.0)
MCHC: 33.7 g/dL (ref 30.0–36.0)
MCV: 96.7 fl (ref 78.0–100.0)
Monocytes Absolute: 0.3 10*3/uL (ref 0.1–1.0)
Monocytes Relative: 8.2 % (ref 3.0–12.0)
NEUTROS ABS: 2 10*3/uL (ref 1.4–7.7)
NEUTROS PCT: 53 % (ref 43.0–77.0)
PLATELETS: 267 10*3/uL (ref 150.0–400.0)
RBC: 4.01 Mil/uL (ref 3.87–5.11)
RDW: 12.6 % (ref 11.5–15.5)
WBC: 3.8 10*3/uL — ABNORMAL LOW (ref 4.0–10.5)

## 2017-12-17 LAB — HEPATIC FUNCTION PANEL
ALK PHOS: 49 U/L (ref 39–117)
ALT: 14 U/L (ref 0–35)
AST: 15 U/L (ref 0–37)
Albumin: 4.5 g/dL (ref 3.5–5.2)
Bilirubin, Direct: 0.3 mg/dL (ref 0.0–0.3)
Total Bilirubin: 1.7 mg/dL — ABNORMAL HIGH (ref 0.2–1.2)
Total Protein: 6.9 g/dL (ref 6.0–8.3)

## 2017-12-17 LAB — TSH: TSH: 2.06 u[IU]/mL (ref 0.35–4.50)

## 2017-12-18 ENCOUNTER — Other Ambulatory Visit: Payer: Self-pay | Admitting: Internal Medicine

## 2017-12-18 DIAGNOSIS — D72819 Decreased white blood cell count, unspecified: Secondary | ICD-10-CM

## 2017-12-18 NOTE — Progress Notes (Signed)
Order placed for f/u cbc.   

## 2017-12-21 ENCOUNTER — Other Ambulatory Visit: Payer: Self-pay | Admitting: Internal Medicine

## 2018-01-11 ENCOUNTER — Other Ambulatory Visit (INDEPENDENT_AMBULATORY_CARE_PROVIDER_SITE_OTHER): Payer: 59

## 2018-01-11 DIAGNOSIS — D72819 Decreased white blood cell count, unspecified: Secondary | ICD-10-CM | POA: Diagnosis not present

## 2018-01-12 LAB — CBC WITH DIFFERENTIAL/PLATELET
Basophils Absolute: 0.1 10*3/uL (ref 0.0–0.1)
Basophils Relative: 1.3 % (ref 0.0–3.0)
EOS ABS: 0.1 10*3/uL (ref 0.0–0.7)
Eosinophils Relative: 1 % (ref 0.0–5.0)
HEMATOCRIT: 38.4 % (ref 36.0–46.0)
Hemoglobin: 12.8 g/dL (ref 12.0–15.0)
LYMPHS ABS: 1.9 10*3/uL (ref 0.7–4.0)
LYMPHS PCT: 28.1 % (ref 12.0–46.0)
MCHC: 33.3 g/dL (ref 30.0–36.0)
MCV: 98 fl (ref 78.0–100.0)
MONOS PCT: 7.3 % (ref 3.0–12.0)
Monocytes Absolute: 0.5 10*3/uL (ref 0.1–1.0)
NEUTROS PCT: 62.3 % (ref 43.0–77.0)
Neutro Abs: 4.2 10*3/uL (ref 1.4–7.7)
PLATELETS: 308 10*3/uL (ref 150.0–400.0)
RBC: 3.92 Mil/uL (ref 3.87–5.11)
RDW: 12.9 % (ref 11.5–15.5)
WBC: 6.7 10*3/uL (ref 4.0–10.5)

## 2018-01-13 ENCOUNTER — Encounter: Payer: Self-pay | Admitting: Internal Medicine

## 2018-02-18 ENCOUNTER — Ambulatory Visit
Admission: RE | Admit: 2018-02-18 | Discharge: 2018-02-18 | Disposition: A | Discharge status: Home / Self Care | Payer: 59 | Source: Ambulatory Visit | Attending: Internal Medicine | Admitting: Internal Medicine

## 2018-02-18 DIAGNOSIS — Z1231 Encounter for screening mammogram for malignant neoplasm of breast: Secondary | ICD-10-CM | POA: Insufficient documentation

## 2018-02-18 DIAGNOSIS — Z1239 Encounter for other screening for malignant neoplasm of breast: Secondary | ICD-10-CM

## 2018-03-17 ENCOUNTER — Other Ambulatory Visit: Payer: Self-pay | Admitting: Internal Medicine

## 2018-05-20 ENCOUNTER — Ambulatory Visit (INDEPENDENT_AMBULATORY_CARE_PROVIDER_SITE_OTHER): Payer: 59

## 2018-05-20 ENCOUNTER — Ambulatory Visit (INDEPENDENT_AMBULATORY_CARE_PROVIDER_SITE_OTHER): Payer: 59 | Admitting: Internal Medicine

## 2018-05-20 ENCOUNTER — Encounter: Payer: Self-pay | Admitting: Internal Medicine

## 2018-05-20 VITALS — BP 118/78 | HR 96 | Temp 98.1°F | Resp 16 | Wt 118.8 lb

## 2018-05-20 DIAGNOSIS — M25512 Pain in left shoulder: Secondary | ICD-10-CM

## 2018-05-20 DIAGNOSIS — I10 Essential (primary) hypertension: Secondary | ICD-10-CM

## 2018-05-20 DIAGNOSIS — E7800 Pure hypercholesterolemia, unspecified: Secondary | ICD-10-CM

## 2018-05-20 DIAGNOSIS — M545 Low back pain, unspecified: Secondary | ICD-10-CM

## 2018-05-20 DIAGNOSIS — M19012 Primary osteoarthritis, left shoulder: Secondary | ICD-10-CM | POA: Diagnosis not present

## 2018-05-20 DIAGNOSIS — M79646 Pain in unspecified finger(s): Secondary | ICD-10-CM

## 2018-05-20 DIAGNOSIS — R0683 Snoring: Secondary | ICD-10-CM

## 2018-05-20 NOTE — Progress Notes (Signed)
[name], female   DOB: 11-Jul-1961, 57 y.o.   MRN: [id number]   Subjective:    [name], female    DOB: August 18, 1961, 57 y.o.   MRN: [id number]  HPI  Patient here for a scheduled follow up.  She reports she is doing relatively well.  Does report concern regarding sleep apnea.  Reports not feeling rested at times when awakens.  Snores.  Is exercising.  Having some persistent issues with her left shoulder. It catches with certain position changes and movements.  Discussed therapy.  No chest pain.  No sob.  No acid reflux.  No abdominal pain.  Bowels moving.  Off tumeric.  Off collagen supplements. Also reports pain in base of thumb. Persistent.  Discussed conservative treatment.  Discussed thumb spica.     Past Medical History:  Diagnosis Date  . Eczema   . Frequent UTI   . H/O lymphadenopathy    With previous submandibular node removals.  . Hypertension   . Recurrent sinus infections    Past Surgical History:  Procedure Laterality Date  . COLONOSCOPY WITH PROPOFOL N/A 08/25/2017   Procedure: COLONOSCOPY WITH PROPOFOL;  Surgeon: Lucilla Lame, MD;  Location: [facility] ENDOSCOPY;  Service: Endoscopy;  Laterality: N/A;  . Lymph Node Removal  1984  . NECK SURGERY  1977   H/O Right neck surgery secondary to a benign growth with when patient was 32 or 57 yo  . skin lesion extraction  2013   basal cell - Dr Phillip Heal  . Uterine Polyps Removed  2004   Family History  Problem Relation Age of Onset  . Hyperlipidemia Mother   . Thyroid disease Mother   . Heart disease Mother        Heart Valve problems  . Coronary artery disease Father        Stents placed  . Hypertension Unknown        uncle  . Parkinson's disease Paternal Uncle   . Parkinson's disease Maternal Grandmother   . Heart disease Maternal Grandfather   . Heart disease Paternal Grandfather        MI  . Breast cancer Neg Hx    Social History   Socioeconomic History  . Marital status:  Divorced    Spouse name: Not on file  . Number of children: 2  . Years of education: Not on file  . Highest education level: Not on file  Occupational History    Employer: [employer]  . Financial resource strain: Not on file  . Food insecurity:    Worry: Not on file    Inability: Not on file  . Transportation needs:    Medical: Not on file    Non-medical: Not on file  Tobacco Use  . Smoking status: Never Smoker  . Smokeless tobacco: Never Used  Substance and Sexual Activity  . Alcohol use: Yes    Alcohol/week: 0.0 standard drinks    Comment: Occasional  . Drug use: No  . Sexual activity: Not on file  Lifestyle  . Physical activity:    Days per week: Not on file    Minutes per session: Not on file  . Stress: Not on file  Relationships  . Social connections:    Talks on phone: Not on file    Gets together: Not on file    Attends religious service: Not on file    Active member of club or organization: Not on file  Attends meetings of clubs or organizations: Not on file    Relationship status: Not on file  Other Topics Concern  . Not on file  Social History Narrative  . Not on file    Outpatient Encounter Medications as of 05/20/2018  Medication Sig  . Calcium Carbonate-Vitamin D (CALCIUM-VITAMIN D) 500-200 MG-UNIT per tablet Take 1 tablet by mouth daily.  Marland Kitchen estradiol (ESTRACE VAGINAL) 0.1 MG/GM vaginal cream One application q hs for 5 nights and then 2 x/week  . fish oil-omega-3 fatty acids 1000 MG capsule Take by mouth daily.  . fluocinonide (LIDEX) 0.05 % external solution APPLY TO AFFECTED AREA(S) AT BEDTIME  . fluocinonide gel (LIDEX) 6.16 % Apply 1 application topically 2 (two) times daily. Apply to affected area twice a day as needed.  . Multiple Vitamin (MULTIVITAMIN) tablet Take 1 tablet by mouth daily.  Marland Kitchen telmisartan (MICARDIS) 20 MG tablet TAKE 1 TABLET (20 MG TOTAL) BY MOUTH DAILY.  Marland Kitchen tretinoin (RETIN-A) 0.05 % cream Apply topically at bedtime.  Apply to affected area three times a day as needed for Acne  . [DISCONTINUED] meloxicam (MOBIC) 7.5 MG tablet TAKE 1 TABLET BY MOUTH DAILY AS NEEDED FOR PAIN.  . [DISCONTINUED] telmisartan (MICARDIS) 20 MG tablet Take 1 tablet (20 mg total) by mouth daily.   No facility-administered encounter medications on file as of 05/20/2018.     Review of Systems  Constitutional: Negative for appetite change and unexpected weight change.  HENT: Negative for congestion and sinus pressure.   Respiratory: Negative for cough, chest tightness and shortness of breath.   Cardiovascular: Negative for chest pain, palpitations and leg swelling.  Gastrointestinal: Negative for abdominal pain, diarrhea, nausea and vomiting.  Genitourinary: Negative for difficulty urinating and dysuria.  Musculoskeletal: Negative for joint swelling and myalgias.       Persistent left shoulder pain as outlined.    Skin: Negative for color change and rash.  Neurological: Negative for dizziness, light-headedness and headaches.  Psychiatric/Behavioral: Negative for agitation and dysphoric mood.       Objective:    Physical Exam  Constitutional: She appears well-developed and well-nourished. No distress.  HENT:  Nose: Nose normal.  Mouth/Throat: Oropharynx is clear and moist.  Neck: Neck supple. No thyromegaly present.  Cardiovascular: Normal rate and regular rhythm.  Pulmonary/Chest: Breath sounds normal. No respiratory distress. She has no wheezes.  Abdominal: Soft. Bowel sounds are normal. There is no tenderness.  Musculoskeletal: She exhibits no edema or tenderness.  Increased pain left shoulder with rotating her arm forward.  Increased pain - base of thumb.    Lymphadenopathy:    She has no cervical adenopathy.  Skin: No rash noted. No erythema.  Psychiatric: She has a normal mood and affect. Her behavior is normal.    BP 118/78 (BP Location: Left Arm, Patient Position: Sitting, Cuff Size: Normal)   Pulse 96   Temp  98.1 F (36.7 C)   Resp 16   Wt 118 lb 12.8 oz (53.9 kg)   LMP 12/01/2008   SpO2 98%   BMI 20.39 kg/m  Wt Readings from Last 3 Encounters:  05/20/18 118 lb 12.8 oz (53.9 kg)  11/19/17 119 lb 12.8 oz (54.3 kg)  08/25/17 116 lb (52.6 kg)     Lab Results  Component Value Date   WBC 6.7 01/11/2018   HGB 12.8 01/11/2018   HCT 38.4 01/11/2018   PLT 308.0 01/11/2018   GLUCOSE 83 12/17/2017   CHOL 188 12/17/2017   TRIG 86.0 12/17/2017  HDL 63.30 12/17/2017   LDLDIRECT 148.9 08/15/2013   LDLCALC 107 (H) 12/17/2017   ALT 14 12/17/2017   AST 15 12/17/2017   NA 139 12/17/2017   K 4.3 12/17/2017   CL 101 12/17/2017   CREATININE 0.82 12/17/2017   BUN 18 12/17/2017   CO2 31 12/17/2017   TSH 2.06 12/17/2017    Mm Screening Breast Tomo Bilateral  Result Date: 02/19/2018 CLINICAL DATA:  Screening. EXAM: DIGITAL SCREENING BILATERAL MAMMOGRAM WITH TOMO AND CAD COMPARISON:  Previous exam(s). ACR Breast Density Category b: There are scattered areas of fibroglandular density. FINDINGS: There are no findings suspicious for malignancy. Images were processed with CAD. IMPRESSION: No mammographic evidence of malignancy. A result letter of this screening mammogram will be mailed directly to the patient. RECOMMENDATION: Screening mammogram in one year. (Code:SM-B-01Y) BI-RADS CATEGORY  1: Negative. Electronically Signed   By: Dorise Bullion III M.D   On: 02/19/2018 12:16       Assessment & Plan:   Problem List Items Addressed This Visit    Back pain    Better with exercise.  Follow.       Essential hypertension, benign    Blood pressure under good control.  Continue same medication regimen.  Follow pressures.  Follow metabolic panel.        Relevant Orders   Basic metabolic panel   Hypercholesterolemia    Low cholesterol diet and exercise.  Follow lipid panel.        Relevant Orders   Lipid panel   Hepatic function panel   Left shoulder pain - Primary    Persistent pain.  Check  xray.  Discussed physical therapy.        Relevant Orders   DG Shoulder Left (Completed)   Snoring    Increased snoring.  Daytime somnolence.  Concern over possible sleep apnea.  History of hypertension.  Schedule pulmonary appt for evaluation for possible sleep apnea.        Relevant Orders   Ambulatory referral to Pulmonology    Other Visit Diagnoses    Thumb pain, unspecified laterality       Pain base of thumb.  Thumb spica.  Follow.         Einar Pheasant, MD

## 2018-05-23 ENCOUNTER — Encounter: Payer: Self-pay | Admitting: Internal Medicine

## 2018-05-23 DIAGNOSIS — M25512 Pain in left shoulder: Secondary | ICD-10-CM | POA: Insufficient documentation

## 2018-05-23 DIAGNOSIS — R0683 Snoring: Secondary | ICD-10-CM | POA: Insufficient documentation

## 2018-05-23 NOTE — Assessment & Plan Note (Signed)
Blood pressure under good control.  Continue same medication regimen.  Follow pressures.  Follow metabolic panel.   

## 2018-05-23 NOTE — Assessment & Plan Note (Signed)
Better with exercise.  Follow.

## 2018-05-23 NOTE — Assessment & Plan Note (Signed)
Persistent pain.  Check xray.  Discussed physical therapy.

## 2018-05-23 NOTE — Assessment & Plan Note (Signed)
Low cholesterol diet and exercise.  Follow lipid panel.   

## 2018-05-23 NOTE — Assessment & Plan Note (Signed)
Increased snoring.  Daytime somnolence.  Concern over possible sleep apnea.  History of hypertension.  Schedule pulmonary appt for evaluation for possible sleep apnea.

## 2018-06-02 DIAGNOSIS — H5213 Myopia, bilateral: Secondary | ICD-10-CM | POA: Diagnosis not present

## 2018-06-03 ENCOUNTER — Encounter: Payer: Self-pay | Admitting: Internal Medicine

## 2018-06-03 ENCOUNTER — Ambulatory Visit (INDEPENDENT_AMBULATORY_CARE_PROVIDER_SITE_OTHER): Payer: 59 | Admitting: Internal Medicine

## 2018-06-03 VITALS — BP 120/86 | HR 84 | Ht 64.0 in

## 2018-06-03 DIAGNOSIS — G4719 Other hypersomnia: Secondary | ICD-10-CM | POA: Diagnosis not present

## 2018-06-03 NOTE — Progress Notes (Signed)
[facility] Consultation      Assessment and Plan:  Excessive daytime sleepiness. - Symptoms and signs of obstructive sleep apnea. - We will send for sleep study.  Essential hypertension. - Sleep apnea can contribute to essential hypertension, therefore treatment of sleep apnea is important part of hypertension management.   Date: 06/03/2018  MRN# [id number] Sara Perry 03/16/1961    Sara Perry is a 57 y.o. old female seen in consultation for chief complaint of:    Chief Complaint  Patient presents with  . Consult    referred by DR. Einar Pheasant for eval sleep apnea.  . Snoring  . excessive daytime sleepiness    HPI:   The patient is a 57 year old female who presents today with symptoms and signs of obstructive sleep apnea, with excessive daytime sleepiness.  She usually goes to bed around 11 PM.  She falls asleep quickly.  She wakes up at 6 AM.  Epworth score is elevated at 15 today.   Her mother has OSA, the patient has htn. She has problems with excessive daytime sleepiness, she snores loudly, and people have to move to other bedrooms. Occasionally she will wake herself up at night. She falls asleep easily during the day if she is watching tv or a movie.  Denies concentration problems.  Denies sleep attacks, denies sleep paralysis. Has occasional sinus drainage.   Denies jaw pain, denies TMJ, no dentures.   PMHX:   Past Medical History:  Diagnosis Date  . Eczema   . Frequent UTI   . H/O lymphadenopathy    With previous submandibular node removals.  . Hypertension   . Recurrent sinus infections    Surgical Hx:  Past Surgical History:  Procedure Laterality Date  . COLONOSCOPY WITH PROPOFOL N/A 08/25/2017   Procedure: COLONOSCOPY WITH PROPOFOL;  Surgeon: Lucilla Lame, MD;  Location: [facility] ENDOSCOPY;  Service: Endoscopy;  Laterality: N/A;  . Lymph Node Removal  1984  . NECK SURGERY  1977   H/O Right neck surgery secondary to a  benign growth with when patient was 32 or 57 yo  . skin lesion extraction  2013   basal cell - Dr Phillip Heal  . Uterine Polyps Removed  2004   Family Hx:  Family History  Problem Relation Age of Onset  . Hyperlipidemia Mother   . Thyroid disease Mother   . Heart disease Mother        Heart Valve problems  . Coronary artery disease Father        Stents placed  . Hypertension Unknown        uncle  . Parkinson's disease Paternal Uncle   . Parkinson's disease Maternal Grandmother   . Heart disease Maternal Grandfather   . Heart disease Paternal Grandfather        MI  . Breast cancer Neg Hx    Social Hx:   Social History   Tobacco Use  . Smoking status: Never Smoker  . Smokeless tobacco: Never Used  Substance Use Topics  . Alcohol use: Yes    Alcohol/week: 0.0 standard drinks    Comment: Occasional  . Drug use: No   Medication:    Current Outpatient Medications:  .  Calcium Carbonate-Vitamin D (CALCIUM-VITAMIN D) 500-200 MG-UNIT per tablet, Take 1 tablet by mouth daily., Disp: , Rfl:  .  estradiol (ESTRACE VAGINAL) 0.1 MG/GM vaginal cream, One application q hs for 5 nights and then 2 x/week, Disp: 42.5 g, Rfl: 1 .  fish  oil-omega-3 fatty acids 1000 MG capsule, Take by mouth daily., Disp: , Rfl:  .  fluocinonide (LIDEX) 0.05 % external solution, APPLY TO AFFECTED AREA(S) AT BEDTIME, Disp: 60 mL, Rfl: 0 .  fluocinonide gel (LIDEX) 9.29 %, Apply 1 application topically 2 (two) times daily. Apply to affected area twice a day as needed., Disp: 60 g, Rfl: 0 .  Multiple Vitamin (MULTIVITAMIN) tablet, Take 1 tablet by mouth daily., Disp: , Rfl:  .  telmisartan (MICARDIS) 20 MG tablet, TAKE 1 TABLET (20 MG TOTAL) BY MOUTH DAILY., Disp: 30 tablet, Rfl: 2 .  tretinoin (RETIN-A) 0.05 % cream, Apply topically at bedtime. Apply to affected area three times a day as needed for Acne, Disp: , Rfl:    Allergies:  Patient has no known allergies.  Review of Systems: Gen:  Denies  fever,  sweats, chills HEENT: Denies blurred vision, double vision. bleeds, sore throat Cvc:  No dizziness, chest pain. Resp:   Denies cough or sputum production, shortness of breath Gi: Denies swallowing difficulty, stomach pain. Gu:  Denies bladder incontinence, burning urine Ext:   No Joint pain, stiffness. Skin: No skin rash,  hives  Endoc:  No polyuria, polydipsia. Psych: No depression, insomnia. Other:  All other systems were reviewed with the patient and were negative other that what is mentioned in the HPI.   Physical Examination:   VS: BP 120/86 (BP Location: Left Arm, Cuff Size: Normal)   Pulse 84   Ht 5\' 4"  (1.626 m)   LMP 12/01/2008   SpO2 98%   BMI 20.39 kg/m   General Appearance: No distress  Neuro:without focal findings,  speech normal,  HEENT: PERRLA, EOM intact.   Pulmonary: normal breath sounds, No wheezing.  CardiovascularNormal S1,S2.  No m/r/g.   Abdomen: Benign, Soft, non-tender. Renal:  No costovertebral tenderness  GU:  No performed at this time. Endoc: No evident thyromegaly, no signs of acromegaly. Skin:   warm, no rashes, no ecchymosis  Extremities: normal, no cyanosis, clubbing.  Other findings:    LABORATORY PANEL:   CBC No results for input(s): WBC, HGB, HCT, PLT in the last 168 hours. ------------------------------------------------------------------------------------------------------------------  Chemistries  No results for input(s): NA, K, CL, CO2, GLUCOSE, BUN, CREATININE, CALCIUM, MG, AST, ALT, ALKPHOS, BILITOT in the last 168 hours.  Invalid input(s): GFRCGP ------------------------------------------------------------------------------------------------------------------  Cardiac Enzymes No results for input(s): TROPONINI in the last 168 hours. ------------------------------------------------------------  RADIOLOGY:  No results found.     Thank  you for the consultation and for allowing [facility] to  assist in the care of your patient. Our recommendations are noted above.  Please contact us if we can be of further service.   Marda Stalker, M.D., F.C.C.P.  Board Certified in Internal Medicine, Pulmonary Medicine, [place], and Sleep Medicine.  [facility] Office Number: [phone]   06/03/2018

## 2018-06-03 NOTE — Patient Instructions (Signed)
Will send for OSA.    Sleep Apnea    Sleep apnea is disorder that affects a person's sleep. A person with sleep apnea has abnormal pauses in their breathing when they sleep. It is hard for them to get a good sleep. This makes a person tired during the day. It also can lead to other physical problems. There are three types of sleep apnea. One type is when breathing stops for a short time because your airway is blocked (obstructive sleep apnea). Another type is when the brain sometimes fails to give the normal signal to breathe to the muscles that control your breathing (central sleep apnea). The third type is a combination of the other two types.  HOME CARE   Take all medicine as told by your doctor.  Avoid alcohol, calming medicines (sedatives), and depressant drugs.  Try to lose weight if you are overweight. Talk to your doctor about a healthy weight goal.  Your doctor may have you use a device that helps to open your airway. It can help you get the air that you need. It is called a positive airway pressure (PAP) device.   MAKE SURE YOU:   Understand these instructions.  Will watch your condition.  Will get help right away if you are not doing well or get worse.  It may take approximately 1 month for you to get used to wearing her CPAP every night.  Be sure to work with your machine to get used to it, be patient, it may take time!  If you have trouble tolerating CPAP DO NOT RETURN YOUR MACHINE; Contact our office to see if we can help you tolerate the CPAP better first!

## 2018-06-07 ENCOUNTER — Encounter: Payer: Self-pay | Admitting: Internal Medicine

## 2018-06-07 DIAGNOSIS — M25512 Pain in left shoulder: Secondary | ICD-10-CM

## 2018-06-07 NOTE — Telephone Encounter (Signed)
Order placed for physical therapy.  

## 2018-06-20 ENCOUNTER — Encounter: Payer: Self-pay | Admitting: Internal Medicine

## 2018-06-22 ENCOUNTER — Other Ambulatory Visit: Payer: Self-pay | Admitting: Internal Medicine

## 2018-06-25 ENCOUNTER — Other Ambulatory Visit (INDEPENDENT_AMBULATORY_CARE_PROVIDER_SITE_OTHER): Payer: 59

## 2018-06-25 DIAGNOSIS — E7800 Pure hypercholesterolemia, unspecified: Secondary | ICD-10-CM

## 2018-06-25 DIAGNOSIS — I10 Essential (primary) hypertension: Secondary | ICD-10-CM | POA: Diagnosis not present

## 2018-06-25 LAB — HEPATIC FUNCTION PANEL
ALT: 17 U/L (ref 0–35)
AST: 17 U/L (ref 0–37)
Albumin: 4.5 g/dL (ref 3.5–5.2)
Alkaline Phosphatase: 52 U/L (ref 39–117)
BILIRUBIN DIRECT: 0.2 mg/dL (ref 0.0–0.3)
BILIRUBIN TOTAL: 1.5 mg/dL — AB (ref 0.2–1.2)
Total Protein: 7 g/dL (ref 6.0–8.3)

## 2018-06-25 LAB — LIPID PANEL
CHOLESTEROL: 187 mg/dL (ref 0–200)
HDL: 55.7 mg/dL (ref >39.00)
LDL CALC: 111 mg/dL — AB (ref 0–99)
NonHDL: 131.4
Total CHOL/HDL Ratio: 3
Triglycerides: 104 mg/dL (ref 0.0–149.0)
VLDL: 20.8 mg/dL (ref 0.0–40.0)

## 2018-06-25 LAB — BASIC METABOLIC PANEL
BUN: 17 mg/dL (ref 6–23)
CHLORIDE: 102 meq/L (ref 96–112)
CO2: 30 mEq/L (ref 19–32)
Calcium: 9.8 mg/dL (ref 8.4–10.5)
Creatinine, Ser: 0.77 mg/dL (ref 0.40–1.20)
GFR: 82.16 mL/min (ref >60.00)
Glucose, Bld: 84 mg/dL (ref 70–99)
Potassium: 3.8 mEq/L (ref 3.5–5.1)
Sodium: 140 mEq/L (ref 135–145)

## 2018-06-29 ENCOUNTER — Ambulatory Visit: Payer: 59 | Attending: Internal Medicine

## 2018-06-29 DIAGNOSIS — M6281 Muscle weakness (generalized): Secondary | ICD-10-CM | POA: Diagnosis not present

## 2018-06-29 DIAGNOSIS — M79651 Pain in right thigh: Secondary | ICD-10-CM | POA: Insufficient documentation

## 2018-06-29 DIAGNOSIS — M62838 Other muscle spasm: Secondary | ICD-10-CM | POA: Insufficient documentation

## 2018-06-30 NOTE — Therapy (Signed)
[facility] [address], [place], [address] Phone: [phone]   Fax:  [phone]  Physical Therapy Evaluation  Patient Details  Name: Sara Perry MRN: [id number] Date of Birth: 1961/03/05 Referring Provider: Einar Pheasant MD   Encounter Date: 06/29/2018  PT End of Session - 06/30/18 1533    Visit Number  1    Number of Visits  13    Date for PT Re-Evaluation  08/10/18    PT Start Time  1600    PT Stop Time  1645    PT Time Calculation (min)  45 min    Activity Tolerance  Patient tolerated treatment well    Behavior During Therapy  [facility] for tasks assessed/performed       Past Medical History:  Diagnosis Date  . Eczema   . Frequent UTI   . H/O lymphadenopathy    With previous submandibular node removals.  . Hypertension   . Recurrent sinus infections     Past Surgical History:  Procedure Laterality Date  . COLONOSCOPY WITH PROPOFOL N/A 08/25/2017   Procedure: COLONOSCOPY WITH PROPOFOL;  Surgeon: Lucilla Lame, MD;  Location: [facility] ENDOSCOPY;  Service: Endoscopy;  Laterality: N/A;  . Lymph Node Removal  1984  . NECK SURGERY  1977   H/O Right neck surgery secondary to a benign growth with when patient was 6 or 57 yo  . skin lesion extraction  2013   basal cell - Dr Phillip Heal  . Uterine Polyps Removed  2004    There were no vitals filed for this visit.   Subjective Assessment - 06/30/18 1109    Subjective  Patient reports increased L shoulder pain mostly with raising her arm overhead and performing IR with her L shoulder. Patient reports no pain without shoulder movement and states the pain does not increased unless performing shoulder internal rotation on the L side. Patient reports increased pain with turning her steering wheel and reaching across her body. Patient reports she performs exercises classes ~ 5 times a week. Patient reports she has a hard time raising her arm overhead. Patient  states no major changes and states it has not improved since the last session.       Pertinent History  Chronic L and R shoulder pain, R LE (quad pain)    Limitations  Lifting    Diagnostic tests  X Ray on L shoulder: No significant changes    Patient Stated Goals  To improve ability to perform overhead activity; less pain with performing ADLs    Currently in Pain?  Yes    Pain Score  0-No pain   worst 3/10   Pain Location  Shoulder    Pain Orientation  Left    Pain Descriptors / Indicators  Aching    Pain Type  Chronic pain    Pain Onset  More than a month ago    Pain Frequency  Intermittent         [facility] PT Assessment - 06/29/18 1613      Assessment   Medical Diagnosis  Lt shoulder pain    Referring Provider  Einar Pheasant MD    Onset Date/Surgical Date  10/13/16    Hand Dominance  Right    Next MD Visit  NOv 19th 2019    Prior Therapy  yes R LE      Precautions   Precautions  None      Restrictions   Weight Bearing  Restrictions  No      Balance Screen   Has the patient fallen in the past 6 months  No    Has the patient had a decrease in activity level because of a fear of falling?   No    Is the patient reluctant to leave their home because of a fear of falling?   No      Home Environment   Living Environment  Private residence    Living Arrangements  Children    Available Help at Discharge  Family    Type of [place] to enter    Entrance Stairs-Number of Steps  3    Entrance Stairs-Rails  None    Home Layout  Two level    Alternate Level Stairs-Number of Steps  13    Alternate Level Stairs-Rails  Left      Prior Function   Level of Independence  Independent    Vocation  Full time employment    Vocation Requirements  pull twisting reaching     Leisure  walking, resistive machines at gym, exercise classes      Cognition   Overall Cognitive Status  Within Functional Limits for tasks assessed      Observation/Other Assessments    Observations  Fair scapular control for retraction, scapular dyskinesis on the R shoulder when lowering to side, slight scapular winging B      Sensation   Light Touch  Appears Intact      Posture/Postural Control   Posture Comments  Slight FHP; not significant      ROM / Strength   AROM / PROM / Strength  AROM;Strength      AROM   AROM Assessment Site  Shoulder;Cervical    Right/Left Shoulder  Right;Left    Right Shoulder Extension  60 Degrees    Right Shoulder Flexion  170 Degrees    Right Shoulder ABduction  170 Degrees    Right Shoulder Internal Rotation  70 Degrees    Right Shoulder External Rotation  90 Degrees    Left Shoulder Extension  60 Degrees    Left Shoulder Flexion  165 Degrees   Increased pain at end range   Left Shoulder ABduction  165 Degrees   Increased pain at end range   Left Shoulder Internal Rotation  55 Degrees   Increased pain at end range   Left Shoulder External Rotation  90 Degrees   Increased pain at end range   Cervical Flexion  WNL    Cervical Extension  WNL      Strength   Strength Assessment Site  Shoulder;Elbow    Right/Left Shoulder  Left;Right    Right Shoulder Flexion  5/5    Right Shoulder Extension  5/5    Right Shoulder ABduction  5/5    Right Shoulder Internal Rotation  5/5    Right Shoulder External Rotation  5/5    Left Shoulder Flexion  4+/5    Left Shoulder Extension  4/5    Left Shoulder ABduction  4/5    Left Shoulder Internal Rotation  4-/5    Left Shoulder External Rotation  4-/5    Right/Left Elbow  --   5/5      Palpation   Palpation comment  Increased TTP to pec and middle delt, infraspinatus        Objective measurements completed on examination: See above findings.   TREATMENT Therapeutic Exercise  90-90 shoulder  IR/ER -- 2 x 20  Shoulder flexion with physiball -- x 20  Push up PLUS against wall -- x 20   Patient demonstrates increased fatigue at the end of the session     PT Education - 06/30/18  1532    Education Details  HEP: 90-90 L shoulder IR/ER, push up PLUS against wall    Person(s) Educated  Patient    Methods  Explanation;Demonstration    Comprehension  Verbalized understanding;Returned demonstration          PT Long Term Goals - 06/30/18 1616      PT LONG TERM GOAL #1   Title  Patient will improve shoulder ER and IR to full AROM without increase in pain to better be able to turn her car without increase in pain    Baseline  Increased pain with performance    Time  6    Period  Weeks    Status  New      PT LONG TERM GOAL #2   Title  Patient will have a worst pain of 1/10 in the shoulder to be able to perform ADLs without increase in shoulder pain.    Baseline  3/10 worst pain    Time  6    Period  Weeks    Status  New      PT LONG TERM GOAL #3   Title  Patient will be independent with HEP to continue benefits of therapy until after discharge.    Baseline  Dependent with form and progression    Time  6    Period  Weeks    Status  New             Plan - 06/30/18 1545    Clinical Impression Statement  Patient is a 57 yo right hand dominant female presenting with increased L shoulder pain from insidious onset 1 year prior. Patient demonstrates increased pain most notably with performing end range IR and ER against resistance and decreased strength with raising her arm overhead. Patient demonstrates increased shoulder dysfunction and patient will benefit from further skilled therapy to return to prior level of function. Patient demonstrates poor shoulder strength and decreased shoulder musculature coordination.     Clinical Presentation  Stable    Clinical Decision Making  Low    Rehab Potential  Good    Clinical Impairments Affecting Rehab Potential  (+) highly active (-) Chronicity    PT Frequency  2x / week    PT Duration  6 weeks    PT Treatment/Interventions  Manual techniques;Neuromuscular re-education;Patient/family education;Therapeutic  exercise;Therapeutic activities;Iontophoresis 4mg /ml Dexamethasone;Moist Heat;Cryotherapy;Electrical Stimulation;Ultrasound;Passive range of motion    PT Next Visit Plan  address shoulder IR/ER    PT Home Exercise Plan  See education section    Consulted and Agree with Plan of Care  Patient       Patient will benefit from skilled therapeutic intervention in order to improve the following deficits and impairments:  Decreased range of motion, Decreased endurance, Decreased strength, Decreased balance, Pain, Decreased coordination, Decreased mobility, Decreased activity tolerance, Postural dysfunction, Impaired sensation, Increased fascial restricitons  Visit Diagnosis: Pain in right thigh  Other muscle spasm  Muscle weakness (generalized)     Problem List Patient Active Problem List   Diagnosis Date Noted  . Left shoulder pain 05/23/2018  . Snoring 05/23/2018  . Benign neoplasm of descending colon   . Benign neoplasm of ascending colon   . Vaginal atrophy 03/22/2017  . Meniere's disease  08/17/2016  . Colon cancer screening 08/11/2015  . Fatigue 06/03/2015  . Health care maintenance 06/03/2015  . Neck pain 02/05/2014  . Hypercholesterolemia 02/05/2014  . Back pain 12/05/2012  . Trigger finger 12/05/2012  . Essential hypertension, benign 12/05/2012    Blythe Stanford, PT DPT 06/30/2018, 4:32 PM  [place] [address] [place], [address] Phone: [phone]   Fax:  [phone]  Name: Crestina Strike MRN: [id number] Date of Birth: 14-Nov-1960

## 2018-07-02 ENCOUNTER — Encounter: Payer: Self-pay | Admitting: Internal Medicine

## 2018-07-02 DIAGNOSIS — R935 Abnormal findings on diagnostic imaging of other abdominal regions, including retroperitoneum: Secondary | ICD-10-CM

## 2018-07-02 DIAGNOSIS — G4719 Other hypersomnia: Secondary | ICD-10-CM

## 2018-07-02 NOTE — Telephone Encounter (Signed)
Order placed for abdominal ultrasound.   

## 2018-07-06 ENCOUNTER — Telehealth: Payer: Self-pay

## 2018-07-06 ENCOUNTER — Ambulatory Visit: Payer: 59

## 2018-07-06 DIAGNOSIS — G4733 Obstructive sleep apnea (adult) (pediatric): Secondary | ICD-10-CM

## 2018-07-06 DIAGNOSIS — M62838 Other muscle spasm: Secondary | ICD-10-CM | POA: Diagnosis not present

## 2018-07-06 DIAGNOSIS — M79651 Pain in right thigh: Secondary | ICD-10-CM

## 2018-07-06 DIAGNOSIS — M6281 Muscle weakness (generalized): Secondary | ICD-10-CM

## 2018-07-06 NOTE — Telephone Encounter (Signed)
LM on VM for patient to call back for sleep study results and recommendations.

## 2018-07-07 NOTE — Therapy (Signed)
[facility] [facility] [address], [place], [address] Phone: [phone]   Fax:  [phone]  Physical Therapy Treatment  Patient Details  Name: Sara Perry MRN: [id number] Date of Birth: [birth year] Referring Provider: Einar Pheasant MD   Encounter Date: 07/06/2018  PT End of Session - 07/07/18 1020    Visit Number  2    Number of Visits  13    Date for PT Re-Evaluation  08/10/18    PT Start Time  1620    PT Stop Time  1705    PT Time Calculation (min)  45 min    Activity Tolerance  Patient tolerated treatment well    Behavior During Therapy  [facility] for tasks assessed/performed       Past Medical History:  Diagnosis Date  . Eczema   . Frequent UTI   . H/O lymphadenopathy    With previous submandibular node removals.  . Hypertension   . Recurrent sinus infections     Past Surgical History:  Procedure Laterality Date  . COLONOSCOPY WITH PROPOFOL N/A 08/25/2017   Procedure: COLONOSCOPY WITH PROPOFOL;  Surgeon: Lucilla Lame, MD;  Location: [facility] ENDOSCOPY;  Service: Endoscopy;  Laterality: N/A;  . Lymph Node Removal  1984  . NECK SURGERY  1977   H/O Right neck surgery secondary to a benign growth with when patient was 69 or 57 yo  . skin lesion extraction  2013   basal cell - Dr Phillip Heal  . Uterine Polyps Removed  2004    There were no vitals filed for this visit.  Subjective Assessment - 07/06/18 1915    Subjective  Patient reports she has been performing the exercises and states she has been having difficulty performing 90-90 shoulder ER in standing.     Pertinent History  Chronic L and R shoulder pain, R LE (quad pain)    Limitations  Lifting    Diagnostic tests  X Ray on L shoulder: No significant changes    Patient Stated Goals  To improve ability to perform overhead activity; less pain with performing ADLs    Currently in Pain?  No/denies    Pain Onset  More than a month ago        TREATMENT Therapeutic Exercise: Scapular shoulder ER in supine - x 10 5 sec holds Scapular shoulder adduction - x 10 5 sec holds  Scapular shoulder IR in supine - x 10 5 sec holds Sidelying shoulder ER - x 10 2#, x 10 3# Standing shoulder ER - x 10 with towel behind hand  Manual therapy: STM performed to the pec major, delts, and lats with patient positioned in supine on the L side to decrease pain and spasms Mobs shoulder inferior, posterior grade - 3 x 30 5 sec with patient in supine  Patient demonstrates fatigue at the end of the session   PT Education - 07/07/18 1019    Education Details  HEP: 90-90 shoulder ER in standing against the wall     Person(s) Educated  Patient    Methods  Explanation;Demonstration    Comprehension  Verbalized understanding;Returned demonstration          PT Long Term Goals - 06/30/18 1616      PT LONG TERM GOAL #1   Title  Patient will improve shoulder ER and IR to full AROM without increase in pain to better be able to turn her car without increase in pain    Baseline  Increased pain with performance    Time  6    Period  Weeks    Status  New      PT LONG TERM GOAL #2   Title  Patient will have a worst pain of 1/10 in the shoulder to be able to perform ADLs without increase in shoulder pain.    Baseline  3/10 worst pain    Time  6    Period  Weeks    Status  New      PT LONG TERM GOAL #3   Title  Patient will be independent with HEP to continue benefits of therapy until after discharge.    Baseline  Dependent with form and progression    Time  6    Period  Weeks    Status  New            Plan - 07/07/18 1028    Clinical Impression Statement  Patient demonstrates decreased shoulder ER on the affected side today compared to the previous side and focused on improving shoulder AROM and strengthening within the newly acquired AROM. Patient demonstrates improvement  as she was performing exercises and fatigue at the end of the  session. Patient will benefit from further skilled therapy to return to prior level of function.     Rehab Potential  Good    Clinical Impairments Affecting Rehab Potential  (+) highly active (-) Chronicity    PT Frequency  2x / week    PT Duration  6 weeks    PT Treatment/Interventions  Manual techniques;Neuromuscular re-education;Patient/family education;Therapeutic exercise;Therapeutic activities;Iontophoresis 4mg /ml Dexamethasone;Moist Heat;Cryotherapy;Electrical Stimulation;Ultrasound;Passive range of motion    PT Next Visit Plan  address shoulder IR/ER    PT Home Exercise Plan  See education section    Consulted and Agree with Plan of Care  Patient       Patient will benefit from skilled therapeutic intervention in order to improve the following deficits and impairments:  Decreased range of motion, Decreased endurance, Decreased strength, Decreased balance, Pain, Decreased coordination, Decreased mobility, Decreased activity tolerance, Postural dysfunction, Impaired sensation, Increased fascial restricitons  Visit Diagnosis: Pain in right thigh  Other muscle spasm  Muscle weakness (generalized)     Problem List Patient Active Problem List   Diagnosis Date Noted  . Left shoulder pain 05/23/2018  . Snoring 05/23/2018  . Benign neoplasm of descending colon   . Benign neoplasm of ascending colon   . Vaginal atrophy 03/22/2017  . Meniere's disease 08/17/2016  . Colon cancer screening 08/11/2015  . Fatigue 06/03/2015  . Health care maintenance 06/03/2015  . Neck pain 02/05/2014  . Hypercholesterolemia 02/05/2014  . Back pain 12/05/2012  . Trigger finger 12/05/2012  . Essential hypertension, benign 12/05/2012    Blythe Stanford, PT DPT 07/07/2018, 11:01 AM  [place] [facility] [facility] [address] [place], [address] Phone: [phone]   Fax:  [phone]  Name: Sara Perry MRN: [id number] Date of Birth:  09-20-1961

## 2018-07-07 NOTE — Telephone Encounter (Signed)
There is a dental device we could try if she can not use CPAP. We usually try that after trying cpap.

## 2018-07-07 NOTE — Addendum Note (Signed)
Addended by: Darreld Mclean on: 07/07/2018 09:47 AM   Modules accepted: Orders

## 2018-07-07 NOTE — Telephone Encounter (Signed)
Patient returning call re sleep study results.  Please call work number

## 2018-07-07 NOTE — Telephone Encounter (Signed)
Spoke to patient, she would like to proceed with CPAP at this time based off Dr. Mathis Fare recommendations. Will put in DME referral. Patient stated usually easier to reach her at work number. No further questions at this time.

## 2018-07-07 NOTE — Telephone Encounter (Signed)
Called patient back on work #, notified her of sleep results and recommendation for CPAP. Patient stated Dr. Juanell Fairly had mentioned a different device before CPAP, but does not remember what it was called. Will speak with Dr. Juanell Fairly and get back to patient. Patient has no further questions at this time.

## 2018-07-08 ENCOUNTER — Ambulatory Visit: Admission: RE | Admit: 2018-07-08 | Payer: 59 | Source: Ambulatory Visit

## 2018-07-08 ENCOUNTER — Ambulatory Visit: Payer: 59

## 2018-07-08 DIAGNOSIS — M6281 Muscle weakness (generalized): Secondary | ICD-10-CM

## 2018-07-08 DIAGNOSIS — M62838 Other muscle spasm: Secondary | ICD-10-CM

## 2018-07-08 DIAGNOSIS — M79651 Pain in right thigh: Secondary | ICD-10-CM | POA: Diagnosis not present

## 2018-07-08 NOTE — Therapy (Signed)
[facility] [address], [place], [address] Phone: [phone]   Fax:  [phone]  Physical Therapy Treatment  Patient Details  Name: Sara Perry MRN: [id number] Date of Birth: [birth year] Referring Provider (PT): Einar Pheasant MD   Encounter Date: 07/08/2018  PT End of Session - 07/08/18 1604    Visit Number  3    Number of Visits  13    Date for PT Re-Evaluation  08/10/18    PT Start Time  1550    PT Stop Time  1630    PT Time Calculation (min)  40 min    Activity Tolerance  Patient tolerated treatment well    Behavior During Therapy  [facility] for tasks assessed/performed       Past Medical History:  Diagnosis Date  . Eczema   . Frequent UTI   . H/O lymphadenopathy    With previous submandibular node removals.  . Hypertension   . Recurrent sinus infections     Past Surgical History:  Procedure Laterality Date  . COLONOSCOPY WITH PROPOFOL N/A 08/25/2017   Procedure: COLONOSCOPY WITH PROPOFOL;  Surgeon: Lucilla Lame, MD;  Location: [facility] ENDOSCOPY;  Service: Endoscopy;  Laterality: N/A;  . Lymph Node Removal  1984  . NECK SURGERY  1977   H/O Right neck surgery secondary to a benign growth with when patient was 80 or 57 yo  . skin lesion extraction  2013   basal cell - Dr Phillip Heal  . Uterine Polyps Removed  2004    There were no vitals filed for this visit.  Subjective Assessment - 07/08/18 1557    Subjective  Patient reports increased soreness along the affected arm after performing sidelying shoulder ER.     Pertinent History  Chronic L and R shoulder pain, R LE (quad pain)    Limitations  Lifting    Diagnostic tests  X Ray on L shoulder: No significant changes    Patient Stated Goals  To improve ability to perform overhead activity; less pain with performing ADLs    Currently in Pain?  No/denies    Pain Onset  More than a month ago         TREATMENT Therapeutic  Exercise: Serratus Punches in standing with RTB - x 20  Prayer stretch in sitting - x 20 Scapular retraction with rows at [place] - 2x 20 20# Straight arm shoulder retractions in standing with GTB - x 20  Behind the back shoulder IR/ER on R and L - x 20  B Shoulder ER with RTB - 2 x 20   Upper back extension over chair with towel    PT Education - 07/08/18 1602    Education Details  Form/technique with exercise    Person(s) Educated  Patient    Methods  Explanation;Demonstration    Comprehension  Verbalized understanding;Returned demonstration          PT Long Term Goals - 06/30/18 1616      PT LONG TERM GOAL #1   Title  Patient will improve shoulder ER and IR to full AROM without increase in pain to better be able to turn her car without increase in pain    Baseline  Increased pain with performance    Time  6    Period  Weeks    Status  New      PT LONG TERM GOAL #2   Title  Patient will have a worst pain of  1/10 in the shoulder to be able to perform ADLs without increase in shoulder pain.    Baseline  3/10 worst pain    Time  6    Period  Weeks    Status  New      PT LONG TERM GOAL #3   Title  Patient will be independent with HEP to continue benefits of therapy until after discharge.    Baseline  Dependent with form and progression    Time  6    Period  Weeks    Status  New            Plan - 07/08/18 1605    Clinical Impression Statement  Patient demonstrates decreasd strength along the persicapular musculature and looked to improve this with performance of exercises. Patient demosntrates decreased ER on the R UE and decreased IR on on the L UE. Patient demonstrates increased fatigue at the end of the session indicating poor strength/endurance and will benefit from further skilled therapy to return to prior level of function.      Rehab Potential  Good    Clinical Impairments Affecting Rehab Potential  (+) highly active (-) Chronicity    PT Frequency  2x / week     PT Duration  6 weeks    PT Treatment/Interventions  Manual techniques;Neuromuscular re-education;Patient/family education;Therapeutic exercise;Therapeutic activities;Iontophoresis 4mg /ml Dexamethasone;Moist Heat;Cryotherapy;Electrical Stimulation;Ultrasound;Passive range of motion    PT Next Visit Plan  address shoulder IR/ER    PT Home Exercise Plan  See education section    Consulted and Agree with Plan of Care  Patient       Patient will benefit from skilled therapeutic intervention in order to improve the following deficits and impairments:  Decreased range of motion, Decreased endurance, Decreased strength, Decreased balance, Pain, Decreased coordination, Decreased mobility, Decreased activity tolerance, Postural dysfunction, Impaired sensation, Increased fascial restricitons  Visit Diagnosis: Pain in right thigh  Other muscle spasm  Muscle weakness (generalized)     Problem List Patient Active Problem List   Diagnosis Date Noted  . Left shoulder pain 05/23/2018  . Snoring 05/23/2018  . Benign neoplasm of descending colon   . Benign neoplasm of ascending colon   . Vaginal atrophy 03/22/2017  . Meniere's disease 08/17/2016  . Colon cancer screening 08/11/2015  . Fatigue 06/03/2015  . Health care maintenance 06/03/2015  . Neck pain 02/05/2014  . Hypercholesterolemia 02/05/2014  . Back pain 12/05/2012  . Trigger finger 12/05/2012  . Essential hypertension, benign 12/05/2012    Blythe Stanford, PT DPT 07/08/2018, 5:34 PM  [place] [facility] [address] [place], [address] Phone: [phone]   Fax:  [phone]  Name: Sara Perry MRN: [id number] Date of Birth: 04/27/1961

## 2018-07-12 DIAGNOSIS — L2089 Other atopic dermatitis: Secondary | ICD-10-CM | POA: Diagnosis not present

## 2018-07-12 DIAGNOSIS — D229 Melanocytic nevi, unspecified: Secondary | ICD-10-CM | POA: Diagnosis not present

## 2018-07-12 DIAGNOSIS — L603 Nail dystrophy: Secondary | ICD-10-CM | POA: Diagnosis not present

## 2018-07-12 DIAGNOSIS — D485 Neoplasm of uncertain behavior of skin: Secondary | ICD-10-CM | POA: Diagnosis not present

## 2018-07-12 DIAGNOSIS — Z1283 Encounter for screening for malignant neoplasm of skin: Secondary | ICD-10-CM | POA: Diagnosis not present

## 2018-07-12 DIAGNOSIS — L578 Other skin changes due to chronic exposure to nonionizing radiation: Secondary | ICD-10-CM | POA: Diagnosis not present

## 2018-07-12 DIAGNOSIS — Z86018 Personal history of other benign neoplasm: Secondary | ICD-10-CM | POA: Diagnosis not present

## 2018-07-12 DIAGNOSIS — L821 Other seborrheic keratosis: Secondary | ICD-10-CM | POA: Diagnosis not present

## 2018-07-13 ENCOUNTER — Ambulatory Visit: Admission: RE | Admit: 2018-07-13 | Payer: 59 | Source: Ambulatory Visit

## 2018-07-14 ENCOUNTER — Ambulatory Visit: Payer: 59 | Attending: Internal Medicine

## 2018-07-14 DIAGNOSIS — M25511 Pain in right shoulder: Secondary | ICD-10-CM | POA: Diagnosis not present

## 2018-07-14 DIAGNOSIS — M79651 Pain in right thigh: Secondary | ICD-10-CM | POA: Insufficient documentation

## 2018-07-14 DIAGNOSIS — M62838 Other muscle spasm: Secondary | ICD-10-CM | POA: Insufficient documentation

## 2018-07-14 DIAGNOSIS — M6281 Muscle weakness (generalized): Secondary | ICD-10-CM | POA: Insufficient documentation

## 2018-07-14 DIAGNOSIS — M25512 Pain in left shoulder: Secondary | ICD-10-CM | POA: Insufficient documentation

## 2018-07-14 DIAGNOSIS — G8929 Other chronic pain: Secondary | ICD-10-CM | POA: Diagnosis not present

## 2018-07-14 NOTE — Therapy (Addendum)
[facility] [address] [place], [address] Phone: [phone]   Fax:  [phone]  Physical Therapy Treatment  Patient Details  Name: Sara Perry MRN: [id number] Date of Birth: 1961-06-30 Referring Provider (PT): Einar Pheasant MD   Encounter Date: 07/14/2018  PT End of Session - 07/14/18 1724    Visit Number  4    Number of Visits  13    Date for PT Re-Evaluation  08/10/18    PT Start Time  1630    PT Stop Time  1718    PT Time Calculation (min)  48 min    Activity Tolerance  Patient tolerated treatment well    Behavior During Therapy  [facility] for tasks assessed/performed       Past Medical History:  Diagnosis Date  . Eczema   . Frequent UTI   . H/O lymphadenopathy    With previous submandibular node removals.  . Hypertension   . Recurrent sinus infections     Past Surgical History:  Procedure Laterality Date  . COLONOSCOPY WITH PROPOFOL N/A 08/25/2017   Procedure: COLONOSCOPY WITH PROPOFOL;  Surgeon: Lucilla Lame, MD;  Location: [facility] ENDOSCOPY;  Service: Endoscopy;  Laterality: N/A;  . Lymph Node Removal  1984  . NECK SURGERY  1977   H/O Right neck surgery secondary to a benign growth with when patient was 33 or 57 yo  . skin lesion extraction  2013   basal cell - Dr Phillip Heal  . Uterine Polyps Removed  2004    There were no vitals filed for this visit.  Subjective Assessment - 07/14/18 1721    Subjective  Patient states the shoulder is able to move more and easier compared to the previous session. Patient reports improvement but states increased popping in the affected shoulder.     Pertinent History  Chronic L and R shoulder pain, R LE (quad pain)    Limitations  Lifting    Diagnostic tests  X Ray on L shoulder: No significant changes    Patient Stated Goals  To improve ability to perform overhead activity; less pain with performing ADLs    Currently in Pain?  No/denies    Pain  Onset  More than a month ago         TREATMENT Therapeutic Exercise: Standing 90-90 shoulder ER against wall - 2 x 20 Push up PLUS against tall table in standing - 2 x 20  Serratus punches with 10# weights unilaterally in supine - 2 x 10 Ball circles on wall in standing - x2 min B cw/ccw  Modalities Dry Needling With patient is supine: (1) .30mm x 66mm needle placed along the middle deltoid along the L side to decrease increased pain in spasms in the area. Performed for 3 min. Patient verbally consents to treatment before needling performed. No signs and symptoms of distress present upon patient leaving the treatment session.    Manual therapy: STM performed to the infraspinatus and pec major with patient positioned in supine on the L side to decrease pain and spasms Mobs shoulder inferior, posterior grade - 3 x 30 5 sec with patient in supine   Patient demonstrates fatigue at the end of the session    PT Education - 07/14/18 1724    Education Details  form/technique with exercise    Person(s) Educated  Patient    Methods  Explanation;Demonstration    Comprehension  Verbalized understanding;Returned demonstration  PT Long Term Goals - 06/30/18 1616      PT LONG TERM GOAL #1   Title  Patient will improve shoulder ER and IR to full AROM without increase in pain to better be able to turn her car without increase in pain    Baseline  Increased pain with performance    Time  6    Period  Weeks    Status  New      PT LONG TERM GOAL #2   Title  Patient will have a worst pain of 1/10 in the shoulder to be able to perform ADLs without increase in shoulder pain.    Baseline  3/10 worst pain    Time  6    Period  Weeks    Status  New      PT LONG TERM GOAL #3   Title  Patient will be independent with HEP to continue benefits of therapy until after discharge.    Baseline  Dependent with form and progression    Time  6    Period  Weeks    Status  New             Plan - 07/14/18 1725    Clinical Impression Statement  Patient demonstrates improvement in shoulder ER today versus previous sessions indicating functional carryover between sessions. Although patient is improving, she continues to have increased pain with end range shoulder ER. Patient demosntrates improvement overall with exercise and will benefit from further skilled therapy to return to prior level of function.     Rehab Potential  Good    Clinical Impairments Affecting Rehab Potential  (+) highly active (-) Chronicity    PT Frequency  2x / week    PT Duration  6 weeks    PT Treatment/Interventions  Manual techniques;Neuromuscular re-education;Patient/family education;Therapeutic exercise;Therapeutic activities;Iontophoresis 4mg /ml Dexamethasone;Moist Heat;Cryotherapy;Electrical Stimulation;Ultrasound;Passive range of motion    PT Next Visit Plan  address shoulder IR/ER    PT Home Exercise Plan  See education section    Consulted and Agree with Plan of Care  Patient       Patient will benefit from skilled therapeutic intervention in order to improve the following deficits and impairments:  Decreased range of motion, Decreased endurance, Decreased strength, Decreased balance, Pain, Decreased coordination, Decreased mobility, Decreased activity tolerance, Postural dysfunction, Impaired sensation, Increased fascial restricitons  Visit Diagnosis: Pain in right thigh  Other muscle spasm  Muscle weakness (generalized)     Problem List Patient Active Problem List   Diagnosis Date Noted  . Left shoulder pain 05/23/2018  . Snoring 05/23/2018  . Benign neoplasm of descending colon   . Benign neoplasm of ascending colon   . Vaginal atrophy 03/22/2017  . Meniere's disease 08/17/2016  . Colon cancer screening 08/11/2015  . Fatigue 06/03/2015  . Health care maintenance 06/03/2015  . Neck pain 02/05/2014  . Hypercholesterolemia 02/05/2014  . Back pain 12/05/2012  .  Trigger finger 12/05/2012  . Essential hypertension, benign 12/05/2012    Blythe Stanford, PT DPT 07/14/2018, 5:37 PM  [facility] [address], [place], [address] Phone: [phone]   Fax:  [phone]  Name: Sara Perry MRN: [id number] Date of Birth: [birth year]

## 2018-07-19 ENCOUNTER — Ambulatory Visit: Payer: 59

## 2018-07-19 DIAGNOSIS — M25512 Pain in left shoulder: Secondary | ICD-10-CM | POA: Diagnosis not present

## 2018-07-19 DIAGNOSIS — M6281 Muscle weakness (generalized): Secondary | ICD-10-CM | POA: Diagnosis not present

## 2018-07-19 DIAGNOSIS — M62838 Other muscle spasm: Secondary | ICD-10-CM | POA: Diagnosis not present

## 2018-07-19 DIAGNOSIS — M25511 Pain in right shoulder: Secondary | ICD-10-CM | POA: Diagnosis not present

## 2018-07-19 DIAGNOSIS — M79651 Pain in right thigh: Secondary | ICD-10-CM | POA: Diagnosis not present

## 2018-07-19 DIAGNOSIS — G8929 Other chronic pain: Secondary | ICD-10-CM | POA: Diagnosis not present

## 2018-07-19 NOTE — Therapy (Signed)
[facility] [address], [place], [address] Phone: [phone]   Fax:  [phone]  Physical Therapy Treatment  Patient Details  Name: Sara Perry MRN: [id number] Date of Birth: 1960/12/08 Referring Provider (PT): Einar Pheasant MD   Encounter Date: 07/19/2018  PT End of Session - 07/19/18 1838    Visit Number  5    Number of Visits  13    Date for PT Re-Evaluation  08/10/18    PT Start Time  6720    PT Stop Time  9470    PT Time Calculation (min)  48 min    Activity Tolerance  Patient tolerated treatment well    Behavior During Therapy  [facility] for tasks assessed/performed       Past Medical History:  Diagnosis Date  . Eczema   . Frequent UTI   . H/O lymphadenopathy    With previous submandibular node removals.  . Hypertension   . Recurrent sinus infections     Past Surgical History:  Procedure Laterality Date  . COLONOSCOPY WITH PROPOFOL N/A 08/25/2017   Procedure: COLONOSCOPY WITH PROPOFOL;  Surgeon: Lucilla Lame, MD;  Location[facility] ENDOSCOPY;  Service: Endoscopy;  Laterality: N/A;  . Lymph Node Removal  1984  . NECK SURGERY  1977   H/O Right neck surgery secondary to a benign growth with when patient was 54 or 57 yo  . skin lesion extraction  2013   basal cell - Dr Phillip Heal  . Uterine Polyps Removed  2004    There were no vitals filed for this visit.  Subjective Assessment - 07/19/18 1837    Subjective  Patient states the shoulder has been feeling better after the dry needling the previous visit. Patient states she did her HEP once since the previous visit.     Pertinent History  Chronic L and R shoulder pain, R LE (quad pain)    Limitations  Lifting    Diagnostic tests  X Ray on L shoulder: No significant changes    Patient Stated Goals  To improve ability to perform overhead activity; less pain with performing ADLs    Currently in Pain?  No/denies    Pain Onset  More than a month  ago         TREATMENT Therapeutic Exercise: Supine 90-90 shoulder ER against the table - x 20 2# Straight arm scapular depression - x 20 10# Push up PLUS against tall table in standing - x 20, x20 against TRX straps Positioned in quadruped on physioball - x 20 B shoulder raises without resistance Standing B ER with RTB - 2 x 20  Straight arm shoulder extension off of - x 20 4#   Modalities Dry Needling With patient is supine: (3) .57mm x 30mm needle placed along the middle deltoid along the L side to decrease increased pain in spasms in the area. Performed for 3 min. Patient verbally consents to treatment before needling performed. No signs and symptoms of distress present upon patient leaving the treatment session.      Patient demonstrates fatigue at the end of the session    PT Education - 07/19/18 1838    Education Details  HEP: shoulder ER B with RTB    Person(s) Educated  Patient    Methods  Demonstration;Explanation    Comprehension  Returned demonstration;Verbalized understanding          PT Long Term Goals - 06/30/18 1616  PT LONG TERM GOAL #1   Title  Patient will improve shoulder ER and IR to full AROM without increase in pain to better be able to turn her car without increase in pain    Baseline  Increased pain with performance    Time  6    Period  Weeks    Status  New      PT LONG TERM GOAL #2   Title  Patient will have a worst pain of 1/10 in the shoulder to be able to perform ADLs without increase in shoulder pain.    Baseline  3/10 worst pain    Time  6    Period  Weeks    Status  New      PT LONG TERM GOAL #3   Title  Patient will be independent with HEP to continue benefits of therapy until after discharge.    Baseline  Dependent with form and progression    Time  6    Period  Weeks    Status  New            Plan - 07/19/18 1838    Clinical Impression Statement  Patient demonstrates improvement overall in pain and spasms, however  conitnues to have difficulty and pain with shoulder ER. Focused on improving lower trap activation with exercise as patient has dominant UT activation with exercise. Patient demonstrates increasd fatigue at the end of the session and will benefit from further skilled therapy to return to prior level of function.     Rehab Potential  Good    Clinical Impairments Affecting Rehab Potential  (+) highly active (-) Chronicity    PT Frequency  2x / week    PT Duration  6 weeks    PT Treatment/Interventions  Manual techniques;Neuromuscular re-education;Patient/family education;Therapeutic exercise;Therapeutic activities;Iontophoresis 4mg /ml Dexamethasone;Moist Heat;Cryotherapy;Electrical Stimulation;Ultrasound;Passive range of motion    PT Next Visit Plan  address shoulder IR/ER    PT Home Exercise Plan  See education section    Consulted and Agree with Plan of Care  Patient       Patient will benefit from skilled therapeutic intervention in order to improve the following deficits and impairments:  Decreased range of motion, Decreased endurance, Decreased strength, Decreased balance, Pain, Decreased coordination, Decreased mobility, Decreased activity tolerance, Postural dysfunction, Impaired sensation, Increased fascial restricitons  Visit Diagnosis: Pain in right thigh  Other muscle spasm  Muscle weakness (generalized)     Problem List Patient Active Problem List   Diagnosis Date Noted  . Left shoulder pain 05/23/2018  . Snoring 05/23/2018  . Benign neoplasm of descending colon   . Benign neoplasm of ascending colon   . Vaginal atrophy 03/22/2017  . Meniere's disease 08/17/2016  . Colon cancer screening 08/11/2015  . Fatigue 06/03/2015  . Health care maintenance 06/03/2015  . Neck pain 02/05/2014  . Hypercholesterolemia 02/05/2014  . Back pain 12/05/2012  . Trigger finger 12/05/2012  . Essential hypertension, benign 12/05/2012    Blythe Stanford, PT DPT 07/19/2018, 6:41 PM  [facility] [address] [place], [address] Phone: [phone]   Fax:  [phone]  Name: Sara Perry MRN: [id number] Date of Birth: 1961-06-25

## 2018-07-21 ENCOUNTER — Ambulatory Visit: Payer: 59

## 2018-07-21 DIAGNOSIS — M6281 Muscle weakness (generalized): Secondary | ICD-10-CM

## 2018-07-21 DIAGNOSIS — M25511 Pain in right shoulder: Secondary | ICD-10-CM | POA: Diagnosis not present

## 2018-07-21 DIAGNOSIS — M25512 Pain in left shoulder: Secondary | ICD-10-CM | POA: Diagnosis not present

## 2018-07-21 DIAGNOSIS — G8929 Other chronic pain: Secondary | ICD-10-CM | POA: Diagnosis not present

## 2018-07-21 DIAGNOSIS — M62838 Other muscle spasm: Secondary | ICD-10-CM | POA: Diagnosis not present

## 2018-07-21 DIAGNOSIS — M79651 Pain in right thigh: Secondary | ICD-10-CM | POA: Diagnosis not present

## 2018-07-21 NOTE — Therapy (Signed)
[facility] [address] [place], [address] Phone: [phone]   Fax:  [phone]  Physical Therapy Treatment  Patient Details  Name: Cristol Engdahl MRN: [id number] Date of Birth: [birth year] Referring Provider (PT): Einar Pheasant MD   Encounter Date: 07/21/2018  PT End of Session - 07/21/18 1834    Visit Number  6    Number of Visits  13    Date for PT Re-Evaluation  08/10/18    PT Start Time  9622    PT Stop Time  1815    PT Time Calculation (min)  45 min    Activity Tolerance  Patient tolerated treatment well    Behavior During Therapy  [facility] for tasks assessed/performed       Past Medical History:  Diagnosis Date  . Eczema   . Frequent UTI   . H/O lymphadenopathy    With previous submandibular node removals.  . Hypertension   . Recurrent sinus infections     Past Surgical History:  Procedure Laterality Date  . COLONOSCOPY WITH PROPOFOL N/A 08/25/2017   Procedure: COLONOSCOPY WITH PROPOFOL;  Surgeon: Lucilla Lame, MD;  Location: [facility] ENDOSCOPY;  Service: Endoscopy;  Laterality: N/A;  . Lymph Node Removal  1984  . NECK SURGERY  1977   H/O Right neck surgery secondary to a benign growth with when patient was 57 or 57  . skin lesion extraction  2013   basal cell - Dr Phillip Heal  . Uterine Polyps Removed  2004    There were no vitals filed for this visit.  Subjective Assessment - 07/21/18 1829    Subjective  Pt reports soreness the day after dry needling but reduced pain today. Pt reports improved ROM and ability to perform dressing, driving and other functional activities.     Pertinent History  Chronic L and R shoulder pain, R LE (quad pain)    Limitations  Lifting    Diagnostic tests  X Ray on L shoulder: No significant changes    Patient Stated Goals  To improve ability to perform overhead activity; less pain with performing ADLs    Currently in Pain?  No/denies    Pain Onset  More  than a month ago        Treatment Manual therapy: STM performed to the infraspinatus, proximal aspect of tricep, mid delt and pec major with patient positioned in supine on the L side to decrease pain and spasms Mobs shoulder inferior, posterior grade IV - 3 x 45  sec with patient in supine  Therapeutic Exercise: AROM IR against the wall x 20 Standing 90 degree abduction IR/ER with RTB/GTB x 20 Standing 0 degree abduction IR with GTB x20 D1 pattern L UE with GTB and 4lb weight x20 Behind the back IR with 4lb weight x20   PT Education - 07/21/18 1832    Education Details  PT added IR strengthening and ROM to HEP.     Person(s) Educated  Patient    Methods  Explanation    Comprehension  Returned demonstration;Verbalized understanding          PT Long Term Goals - 06/30/18 1616      PT LONG TERM GOAL #1   Title  Patient will improve shoulder ER and IR to full AROM without increase in pain to better be able to turn her car without increase in pain    Baseline  Increased pain with performance  Time  6    Period  Weeks    Status  New      PT LONG TERM GOAL #2   Title  Patient will have a worst pain of 1/10 in the shoulder to be able to perform ADLs without increase in shoulder pain.    Baseline  3/10 worst pain    Time  6    Period  Weeks    Status  New      PT LONG TERM GOAL #3   Title  Patient will be independent with HEP to continue benefits of therapy until after discharge.    Baseline  Dependent with form and progression    Time  6    Period  Weeks    Status  New            Plan - 07/21/18 1835    Clinical Impression Statement  Pt demo improved ROM, pain level and ability to complete functional tasks such as driving and doffing clothing. Pt demo limited shoulder IR and continues to experience some "tightness" with end range ER. Pt will benefit from continued PT to improve ROM and strength to return to prior level of function.     Rehab Potential  Good     Clinical Impairments Affecting Rehab Potential  (+) highly active (-) Chronicity    PT Frequency  2x / week    PT Duration  6 weeks    PT Treatment/Interventions  Manual techniques;Neuromuscular re-education;Patient/family education;Therapeutic exercise;Therapeutic activities;Iontophoresis 4mg /ml Dexamethasone;Moist Heat;Cryotherapy;Electrical Stimulation;Ultrasound;Passive range of motion    PT Next Visit Plan  address shoulder IR/ER    PT Home Exercise Plan  See education section    Consulted and Agree with Plan of Care  Patient       Patient will benefit from skilled therapeutic intervention in order to improve the following deficits and impairments:  Decreased range of motion, Decreased endurance, Decreased strength, Decreased balance, Pain, Decreased coordination, Decreased mobility, Decreased activity tolerance, Postural dysfunction, Impaired sensation, Increased fascial restricitons  Visit Diagnosis: Other muscle spasm  Muscle weakness (generalized)     Problem List Patient Active Problem List   Diagnosis Date Noted  . Left shoulder pain 05/23/2018  . Snoring 05/23/2018  . Benign neoplasm of descending colon   . Benign neoplasm of ascending colon   . Vaginal atrophy 03/22/2017  . Meniere's disease 08/17/2016  . Colon cancer screening 08/11/2015  . Fatigue 06/03/2015  . Health care maintenance 06/03/2015  . Neck pain 02/05/2014  . Hypercholesterolemia 02/05/2014  . Back pain 12/05/2012  . Trigger finger 12/05/2012  . Essential hypertension, benign 12/05/2012   Karilyn Cota SPT Blythe Stanford PT DPT 07/21/2018, 6:41 PM  [place] [facility] [address] [place], [address] Phone: [phone]   Fax:  [phone]  Name: Wanita Derenzo MRN: [id number] Date of Birth: 21-May-1961

## 2018-07-26 ENCOUNTER — Ambulatory Visit: Payer: 59

## 2018-07-26 ENCOUNTER — Encounter: Payer: Self-pay | Admitting: Internal Medicine

## 2018-07-26 DIAGNOSIS — M62838 Other muscle spasm: Secondary | ICD-10-CM | POA: Diagnosis not present

## 2018-07-26 DIAGNOSIS — M25511 Pain in right shoulder: Secondary | ICD-10-CM | POA: Diagnosis not present

## 2018-07-26 DIAGNOSIS — M79651 Pain in right thigh: Secondary | ICD-10-CM | POA: Diagnosis not present

## 2018-07-26 DIAGNOSIS — M6281 Muscle weakness (generalized): Secondary | ICD-10-CM | POA: Diagnosis not present

## 2018-07-26 DIAGNOSIS — M25512 Pain in left shoulder: Secondary | ICD-10-CM | POA: Diagnosis not present

## 2018-07-26 DIAGNOSIS — G8929 Other chronic pain: Secondary | ICD-10-CM | POA: Diagnosis not present

## 2018-07-26 NOTE — Therapy (Signed)
[facility] [address] [place], [address] Phone: [phone]   Fax:  [phone]  Physical Therapy Treatment  Patient Details  Name: Sara Perry MRN: [id number] Date of Birth: Jun 13, 1961 Referring Provider (PT): Einar Pheasant MD   Encounter Date: 07/26/2018  PT End of Session - 07/26/18 1859    Visit Number  7    Number of Visits  13    Date for PT Re-Evaluation  08/10/18    PT Start Time  1800    PT Stop Time  1845    PT Time Calculation (min)  45 min    Activity Tolerance  Patient tolerated treatment well    Behavior During Therapy  [facility] for tasks assessed/performed       Past Medical History:  Diagnosis Date  . Eczema   . Frequent UTI   . H/O lymphadenopathy    With previous submandibular node removals.  . Hypertension   . Recurrent sinus infections     Past Surgical History:  Procedure Laterality Date  . COLONOSCOPY WITH PROPOFOL N/A 08/25/2017   Procedure: COLONOSCOPY WITH PROPOFOL;  Surgeon: Lucilla Lame, MD;  Location: [facility] ENDOSCOPY;  Service: Endoscopy;  Laterality: N/A;  . Lymph Node Removal  1984  . NECK SURGERY  1977   H/O Right neck surgery secondary to a benign growth with when patient was 80 or 57 yo  . skin lesion extraction  2013   basal cell - Dr Phillip Heal  . Uterine Polyps Removed  2004    There were no vitals filed for this visit.  Subjective Assessment - 07/26/18 1816    Subjective  Pt reports reduced pain and improved ROM since last visit. Pt reports that ER exercises in particular led to improved mobility and pain since last visit. Pain reports stiffness today but no pain.    Pertinent History  Chronic L and R shoulder pain, R LE (quad pain)    Limitations  Lifting    Diagnostic tests  X Ray on L shoulder: No significant changes    Patient Stated Goals  To improve ability to perform overhead activity; less pain with performing ADLs    Currently in Pain?   No/denies    Pain Onset  More than a month ago      Treatment Manual therapy: STM performed to the Lat with patient positioned in supine on the L side to decrease pain and spasms Mobs shoulder inferior, posterior grade IV - 3 x 45  sec with patient in supine  Therapeutic Exercise: AROM IR against the wall and wall angels x 20 Standing 90 degree abduction IR/ER with RTB/GTB/YTB x 20 Behind the back IR with 2lb weight x 20 Lat stretch in child's pose 4 x 30 sec   Modalities Dry Needling With patient is supine: (3) .62mm x 76mm needle placed along the anterior deltoid along the L side to decrease increased pain in spasms in the area. Performed for 3 min. Patient verbally consents to treatment before needling performed. No signs and symptoms of distress present upon patient leaving the treatment session.  Patient demonstrates increased fatigue at the end of the session     PT Education - 07/26/18 [place]    Education Details  Pt educated on form and technique. Pt educated to add lat stretch to HEP.     Person(s) Educated  Patient    Methods  Explanation;Demonstration    Comprehension  Verbalized understanding;Returned demonstration  PT Long Term Goals - 06/30/18 1616      PT LONG TERM GOAL #1   Title  Patient will improve shoulder ER and IR to full AROM without increase in pain to better be able to turn her car without increase in pain    Baseline  Increased pain with performance    Time  6    Period  Weeks    Status  New      PT LONG TERM GOAL #2   Title  Patient will have a worst pain of 1/10 in the shoulder to be able to perform ADLs without increase in shoulder pain.    Baseline  3/10 worst pain    Time  6    Period  Weeks    Status  New      PT LONG TERM GOAL #3   Title  Patient will be independent with HEP to continue benefits of therapy until after discharge.    Baseline  Dependent with form and progression    Time  6    Period  Weeks    Status  New             Plan - 07/26/18 1901    Clinical Impression Statement  Pt demo improved ROM, decreased pain and increased function since last visit. Pt reported stiffness but showed no apparent limitation and ROM appeared WNL. Pt reported a tender spot over bicep tendon. Pt reported relief after STM on lats and DN to bicep. Pt continues to demo weakness in IR and ER as well as reporting stiffness. Pt will benefit from PT to continue improving strength and ease with ROM to return to prior level of function.     Rehab Potential  Good    Clinical Impairments Affecting Rehab Potential  (+) highly active (-) Chronicity    PT Frequency  2x / week    PT Duration  6 weeks    PT Treatment/Interventions  Manual techniques;Neuromuscular re-education;Patient/family education;Therapeutic exercise;Therapeutic activities;Iontophoresis 4mg /ml Dexamethasone;Moist Heat;Cryotherapy;Electrical Stimulation;Ultrasound;Passive range of motion    PT Next Visit Plan  address shoulder IR/ER    PT Home Exercise Plan  See education section    Consulted and Agree with Plan of Care  Patient       Patient will benefit from skilled therapeutic intervention in order to improve the following deficits and impairments:  Decreased range of motion, Decreased endurance, Decreased strength, Decreased balance, Pain, Decreased coordination, Decreased mobility, Decreased activity tolerance, Postural dysfunction, Impaired sensation, Increased fascial restricitons  Visit Diagnosis: Muscle weakness (generalized)     Problem List Patient Active Problem List   Diagnosis Date Noted  . Left shoulder pain 05/23/2018  . Snoring 05/23/2018  . Benign neoplasm of descending colon   . Benign neoplasm of ascending colon   . Vaginal atrophy 03/22/2017  . Meniere's disease 08/17/2016  . Colon cancer screening 08/11/2015  . Fatigue 06/03/2015  . Health care maintenance 06/03/2015  . Neck pain 02/05/2014  . Hypercholesterolemia 02/05/2014   . Back pain 12/05/2012  . Trigger finger 12/05/2012  . Essential hypertension, benign 12/05/2012   Karilyn Cota SPT  Blythe Stanford, PT DPT 07/26/2018, 7:05 PM  [place] [address] [place], [address] Phone: [phone]   Fax:  [phone]  Name: Vicke Plotner MRN: [id number] Date of Birth: [birth year]

## 2018-07-27 NOTE — Telephone Encounter (Signed)
Order placed for physical therapy.  

## 2018-07-28 ENCOUNTER — Ambulatory Visit: Payer: 59

## 2018-07-28 DIAGNOSIS — M6281 Muscle weakness (generalized): Secondary | ICD-10-CM | POA: Diagnosis not present

## 2018-07-28 DIAGNOSIS — G8929 Other chronic pain: Secondary | ICD-10-CM | POA: Diagnosis not present

## 2018-07-28 DIAGNOSIS — M79651 Pain in right thigh: Secondary | ICD-10-CM | POA: Diagnosis not present

## 2018-07-28 DIAGNOSIS — M25511 Pain in right shoulder: Secondary | ICD-10-CM | POA: Diagnosis not present

## 2018-07-28 DIAGNOSIS — M25512 Pain in left shoulder: Secondary | ICD-10-CM | POA: Diagnosis not present

## 2018-07-28 DIAGNOSIS — M62838 Other muscle spasm: Secondary | ICD-10-CM | POA: Diagnosis not present

## 2018-07-28 NOTE — Therapy (Signed)
[facility] [address] [place], [address] Phone: [phone]   Fax:  [phone]  Physical Therapy Treatment  Patient Details  Name: Sara Perry MRN: [id number] Date of Birth: December 04, 1960 Referring Provider (PT): Einar Pheasant MD   Encounter Date: 07/28/2018  PT End of Session - 07/28/18 1813    Visit Number  8    Number of Visits  13    Date for PT Re-Evaluation  08/10/18    PT Start Time  2947    PT Stop Time  1800    PT Time Calculation (min)  45 min    Activity Tolerance  Patient tolerated treatment well    Behavior During Therapy  [facility] for tasks assessed/performed       Past Medical History:  Diagnosis Date  . Eczema   . Frequent UTI   . H/O lymphadenopathy    With previous submandibular node removals.  . Hypertension   . Recurrent sinus infections     Past Surgical History:  Procedure Laterality Date  . COLONOSCOPY WITH PROPOFOL N/A 08/25/2017   Procedure: COLONOSCOPY WITH PROPOFOL;  Surgeon: Lucilla Lame, MD;  Location: [facility] ENDOSCOPY;  Service: Endoscopy;  Laterality: N/A;  . Lymph Node Removal  1984  . NECK SURGERY  1977   H/O Right neck surgery secondary to a benign growth with when patient was 89 or 57 yo  . skin lesion extraction  2013   basal cell - Dr Phillip Heal  . Uterine Polyps Removed  2004    There were no vitals filed for this visit.  Subjective Assessment - 07/28/18 1810    Subjective  Pt reports soreness in the area needled during previous visit but no tightness. Pt reports improved ROM since last visit.     Pertinent History  Chronic L and R shoulder pain, R LE (quad pain)    Limitations  Lifting    Diagnostic tests  X Ray on L shoulder: No significant changes    Patient Stated Goals  To improve ability to perform overhead activity; less pain with performing ADLs    Currently in Pain?  No/denies    Pain Onset  More than a month ago      Treatment Manual  therapy: STM performed to the pec major with patient positioned in supine on the L side to decrease pain and spasms Mobs shoulder inferior, posterior gradeIV-3 x 45sec with patient in supine  Therapeutic Exercise: Push ups on flat -- x 5 Lateral shifting on flat side of bosu ball -- x 15 Pec stretch in door frame, corner and on foam roller  3 x 30 sec Standing 90 degree abduction IR/ER with GTB x 20 Bodyblade stabilizing IR/ER  3 x 1 min  Patient demonstrates increased fatigue at the end of the session   PT Education - 07/28/18 [place]    Education Details  Pt educated on exercise form and technique. Pt educated to add pec stretch to HEP.     Person(s) Educated  Patient    Methods  Explanation;Demonstration    Comprehension  Verbalized understanding;Returned demonstration          PT Long Term Goals - 06/30/18 1616      PT LONG TERM GOAL #1   Title  Patient will improve shoulder ER and IR to full AROM without increase in pain to better be able to turn her car without increase in pain    Baseline  Increased pain  with performance    Time  6    Period  Weeks    Status  New      PT LONG TERM GOAL #2   Title  Patient will have a worst pain of 1/10 in the shoulder to be able to perform ADLs without increase in shoulder pain.    Baseline  3/10 worst pain    Time  6    Period  Weeks    Status  New      PT LONG TERM GOAL #3   Title  Patient will be independent with HEP to continue benefits of therapy until after discharge.    Baseline  Dependent with form and progression    Time  6    Period  Weeks    Status  New            Plan - 07/28/18 1814    Clinical Impression Statement  Pt demo improved ROM, decreased pain and increased function since last visit. Pt ROM is WNL. Pt reports that tightness over bicep tendon is no longer present. Pt continues to demo weakness in IR/ER and reports tightness ER. Pt will benefit from continued PT to return to prior level of  function.     Rehab Potential  Good    Clinical Impairments Affecting Rehab Potential  (+) highly active (-) Chronicity    PT Frequency  2x / week    PT Duration  6 weeks    PT Treatment/Interventions  Manual techniques;Neuromuscular re-education;Patient/family education;Therapeutic exercise;Therapeutic activities;Iontophoresis 4mg /ml Dexamethasone;Moist Heat;Cryotherapy;Electrical Stimulation;Ultrasound;Passive range of motion    PT Next Visit Plan  address shoulder IR/ER    PT Home Exercise Plan  See education section    Consulted and Agree with Plan of Care  Patient       Patient will benefit from skilled therapeutic intervention in order to improve the following deficits and impairments:  Decreased range of motion, Decreased endurance, Decreased strength, Decreased balance, Pain, Decreased coordination, Decreased mobility, Decreased activity tolerance, Postural dysfunction, Impaired sensation, Increased fascial restricitons  Visit Diagnosis: Muscle weakness (generalized)     Problem List Patient Active Problem List   Diagnosis Date Noted  . Left shoulder pain 05/23/2018  . Snoring 05/23/2018  . Benign neoplasm of descending colon   . Benign neoplasm of ascending colon   . Vaginal atrophy 03/22/2017  . Meniere's disease 08/17/2016  . Colon cancer screening 08/11/2015  . Fatigue 06/03/2015  . Health care maintenance 06/03/2015  . Neck pain 02/05/2014  . Hypercholesterolemia 02/05/2014  . Back pain 12/05/2012  . Trigger finger 12/05/2012  . Essential hypertension, benign 12/05/2012   Karilyn Cota, SPT  Blythe Stanford,  PT DPT 07/28/2018, 6:18 PM  [facility] [address] [place], [address] Phone: [phone]   Fax:  [phone]  Name: Sara Perry MRN: [id number] Date of Birth: 1960-12-31

## 2018-08-02 ENCOUNTER — Ambulatory Visit: Payer: 59

## 2018-08-02 DIAGNOSIS — G8929 Other chronic pain: Secondary | ICD-10-CM | POA: Diagnosis not present

## 2018-08-02 DIAGNOSIS — M25511 Pain in right shoulder: Secondary | ICD-10-CM

## 2018-08-02 DIAGNOSIS — M79651 Pain in right thigh: Secondary | ICD-10-CM | POA: Diagnosis not present

## 2018-08-02 DIAGNOSIS — M6281 Muscle weakness (generalized): Secondary | ICD-10-CM | POA: Diagnosis not present

## 2018-08-02 DIAGNOSIS — M62838 Other muscle spasm: Secondary | ICD-10-CM

## 2018-08-02 DIAGNOSIS — M25512 Pain in left shoulder: Secondary | ICD-10-CM

## 2018-08-03 NOTE — Therapy (Signed)
[facility] [address], [place], [address] Phone: [phone]   Fax:  [phone]  Physical Therapy Evaluation  Patient Details  Name: Sara Perry MRN: [id number] Date of Birth: Jun 23, 1961 Referring Provider (PT): Einar Pheasant MD   Encounter Date: 08/02/2018  PT End of Session - 08/03/18 1328    Visit Number  1    Number of Visits  13    Date for PT Re-Evaluation  09/13/18    PT Start Time  1845    PT Stop Time  1930    PT Time Calculation (min)  45 min    Activity Tolerance  Patient tolerated treatment well    Behavior During Therapy  [facility] for tasks assessed/performed       Past Medical History:  Diagnosis Date  . Eczema   . Frequent UTI   . H/O lymphadenopathy    With previous submandibular node removals.  . Hypertension   . Recurrent sinus infections     Past Surgical History:  Procedure Laterality Date  . COLONOSCOPY WITH PROPOFOL N/A 08/25/2017   Procedure: COLONOSCOPY WITH PROPOFOL;  Surgeon: Lucilla Lame, MD;  Location: [facility] ENDOSCOPY;  Service: Endoscopy;  Laterality: N/A;  . Lymph Node Removal  1984  . NECK SURGERY  1977   H/O Right neck surgery secondary to a benign growth with when patient was 53 or 57 yo  . skin lesion extraction  2013   basal cell - Dr Phillip Heal  . Uterine Polyps Removed  2004    There were no vitals filed for this visit.   Subjective Assessment - 08/03/18 1304    Subjective  Patient reports increased B shoulder pain (currently being seen for L shoulder pain) and reports the pain in the R shoulder is much increased compared to the L shoulder. 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 Patient reports specifically more pain after having to press hard into a patient to get clearn pictures most notably with reaching.  Patient reports increased pain at the end of the day after performing occupational tasks. Patient reports the pain has been getting progressively worse.     Pertinent History  Chronic L and R shoulder pain, R LE (quad pain)    Limitations  Lifting    Diagnostic tests  X Ray on L shoulder: No significant changes    Patient Stated Goals  To improve ability to perform overhead activity; less pain with performing ADLs    Currently in Pain?  No/denies   Worst: (8/10)   Pain Score  0-No pain    Pain Location  Shoulder    Pain Orientation  Left    Pain Descriptors / Indicators  Aching;Sore    Pain Type  Chronic pain    Pain Onset  More than a month ago    Pain Frequency  Intermittent         [facility] PT Assessment - 08/03/18 2222      Assessment   Medical Diagnosis  Rt Shoulder Pain    Referring Provider (PT)  Einar Pheasant MD    Onset Date/Surgical Date  10/13/16    Hand Dominance  Right    Next MD Visit  NOv 19th 2019    Prior Therapy  yes R LE      Precautions   Precautions  None  Balance Screen   Has the patient fallen in the past 6 months  No    Has the patient had a decrease in activity level because of a fear of falling?   No    Is the patient reluctant to leave their home because of a fear of falling?   No      Home Environment   Living Environment  Private residence    Living Arrangements  Children    Available Help at Discharge  Family    Type of [place] to enter    Entrance Stairs-Number of Steps  3    Entrance Stairs-Rails  None    Home Layout  Two level    Alternate Level Stairs-Number of Steps  13    Alternate Level Stairs-Rails  Left      Prior Function   Level of Independence  Independent    Vocation  Full time employment    Vocation Requirements  pull twisting reaching     Leisure  walking, resistive machines at gym, exercise classes      Cognition   Overall Cognitive Status  Within Functional Limits for tasks assessed       Observation/Other Assessments   Observations  Increased shoulder elevation on the Right      Sensation   Light Touch  Appears Intact      Posture/Postural Control   Posture Comments  Slight FHP; not significant      AROM   AROM Assessment Site  Cervical    Right Shoulder Flexion  170 Degrees   Increased pain at end range   Right Shoulder ABduction  170 Degrees   Increased pain at end range   Right Shoulder Internal Rotation  70 Degrees   Increased pain at end range   Right Shoulder External Rotation  90 Degrees   Increased pain at end range   Left Shoulder Extension  60 Degrees    Left Shoulder Flexion  170 Degrees    Left Shoulder ABduction  170 Degrees    Left Shoulder Internal Rotation  60 Degrees    Left Shoulder External Rotation  90 Degrees    Cervical Flexion  WNL    Cervical Extension  WNL    Cervical - Right Side Bend  30deg- pain at end range    Cervical - Left Side Bend  40deg      Strength   Right Shoulder Flexion  5/5   Increased pain with performance   Right Shoulder Extension  5/5   Increased pain with performance   Right Shoulder ABduction  5/5   Increased pain with performance   Right Shoulder Internal Rotation  5/5   Increased pain with performance   Right Shoulder External Rotation  5/5   Increased pain with performance   Left Shoulder Flexion  5/5    Left Shoulder Extension  5/5    Left Shoulder ABduction  4+/5    Left Shoulder Internal Rotation  4+/5    Left Shoulder External Rotation  4+/5      Palpation   Palpation comment  Increased TTP along R UT, infraspinatus, pec major, ant delt      Special Tests    Special Tests  Cervical    Cervical Tests  Spurling's      Spurling's   Findings  Negative    Side  Right       Objective measurements completed on examination: See above  findings.   TREATMENT Manual Therapy:  STM performed to ant delt, pec major, UT, infraspinatus and middle delt with patient positioned in sitting to decrease  increased pain and spasms -- 85min   Patient demonstrates decreased pain at the end of the session     PT Education - 08/03/18 1326    Education Details  form/technique with exercises; HEP: shoulder ER with GTB    Person(s) Educated  Patient    Methods  Explanation;Demonstration    Comprehension  Verbalized understanding;Returned demonstration          PT Long Term Goals - 08/03/18 2218      PT LONG TERM GOAL #1   Title  Patient will improve shoulder ER and IR to full AROM without increase in pain to better be able to turn her car without increase in pain    Baseline  Increased pain with performance; 08/02/2018: L shoulder: full AROM without increase in pain, R shoulder increased pain at end range for both directions    Time  6    Period  Weeks    Status  On-going      PT LONG TERM GOAL #2   Title  Patient will have a worst pain of 1/10 in the shoulder to be able to perform ADLs without increase in shoulder pain.    Baseline  3/10 worst pain; L shoulder: 1/10 worst pain; R shoulder 8/10 worst pain    Time  6    Period  Weeks    Status  On-going      PT LONG TERM GOAL #3   Title  Patient will be independent with HEP to continue benefits of therapy until after discharge.    Baseline  Dependent with form and progression; 08/03/2018: independent for L shoulder exercises, Heavy to moderate cueing to perform R shoulder exercises    Time  6    Period  Weeks    Status  On-going             Plan - 08/03/18 1337    Clinical Impression Statement  Patient is a 57 yo right hand dominant female presenting with increased R shoulder pain most notably with performing reaching and pressing against resistance outside of BOS. Patient demosntrates increased B shoulder dysfunction as indicated by increased pain with use. Patient demosntrates overall muscular guarding with performing shoulder movements indicating further dysfunction. Patient also demosntrates decreased strength and pain  and will benefit from further skilled therapy to return to prior level of function.      Clinical Presentation  Stable    Clinical Presentation due to:  Patient's pain not worsening     Clinical Decision Making  Low    Rehab Potential  Good    Clinical Impairments Affecting Rehab Potential  (+) highly active (-) Chronicity    PT Frequency  2x / week    PT Duration  6 weeks    PT Treatment/Interventions  Manual techniques;Neuromuscular re-education;Patient/family education;Therapeutic exercise;Therapeutic activities;Iontophoresis 4mg /ml Dexamethasone;Moist Heat;Cryotherapy;Electrical Stimulation;Ultrasound;Passive range of motion;Dry needling;Joint Manipulations    PT Next Visit Plan  address shoulder IR/ER    PT Home Exercise Plan  See education section    Consulted and Agree with Plan of Care  Patient       Patient will benefit from skilled therapeutic intervention in order to improve the following deficits and impairments:  Decreased range of motion, Decreased endurance, Decreased strength, Decreased balance, Pain, Decreased coordination, Decreased mobility, Decreased activity tolerance, Postural dysfunction, Impaired sensation, Increased  fascial restricitons  Visit Diagnosis: Muscle weakness (generalized)  Other muscle spasm  Chronic right shoulder pain  Chronic left shoulder pain     Problem List Patient Active Problem List   Diagnosis Date Noted  . Left shoulder pain 05/23/2018  . Snoring 05/23/2018  . Benign neoplasm of descending colon   . Benign neoplasm of ascending colon   . Vaginal atrophy 03/22/2017  . Meniere's disease 08/17/2016  . Colon cancer screening 08/11/2015  . Fatigue 06/03/2015  . Health care maintenance 06/03/2015  . Neck pain 02/05/2014  . Hypercholesterolemia 02/05/2014  . Back pain 12/05/2012  . Trigger finger 12/05/2012  . Essential hypertension, benign 12/05/2012    Blythe Stanford, PT DPT 08/03/2018, 10:30 PM  [facility] [address] [place], [address] Phone: [phone]   Fax:  [phone]  Name: Sara Perry MRN: [id number] Date of Birth: August 15, 1961

## 2018-08-04 ENCOUNTER — Ambulatory Visit: Payer: 59

## 2018-08-04 DIAGNOSIS — M25511 Pain in right shoulder: Secondary | ICD-10-CM | POA: Diagnosis not present

## 2018-08-04 DIAGNOSIS — M6281 Muscle weakness (generalized): Secondary | ICD-10-CM

## 2018-08-04 DIAGNOSIS — G8929 Other chronic pain: Secondary | ICD-10-CM

## 2018-08-04 DIAGNOSIS — M25512 Pain in left shoulder: Secondary | ICD-10-CM | POA: Diagnosis not present

## 2018-08-04 DIAGNOSIS — M79651 Pain in right thigh: Secondary | ICD-10-CM | POA: Diagnosis not present

## 2018-08-04 DIAGNOSIS — M62838 Other muscle spasm: Secondary | ICD-10-CM

## 2018-08-04 NOTE — Therapy (Signed)
[facility] [address] [place], [address] Phone: [phone]   Fax:  [phone]  Physical Therapy Treatment  Patient Details  Name: Sara Perry MRN: [id number] Date of Birth: 01/08/1961 Referring Provider (PT): Einar Pheasant MD   Encounter Date: 08/04/2018  PT End of Session - 08/04/18 1819    Visit Number  2    Number of Visits  13    Date for PT Re-Evaluation  09/13/18    PT Start Time  1720    PT Stop Time  1800    PT Time Calculation (min)  40 min    Activity Tolerance  Patient tolerated treatment well    Behavior During Therapy  [facility] for tasks assessed/performed       Past Medical History:  Diagnosis Date  . Eczema   . Frequent UTI   . H/O lymphadenopathy    With previous submandibular node removals.  . Hypertension   . Recurrent sinus infections     Past Surgical History:  Procedure Laterality Date  . COLONOSCOPY WITH PROPOFOL N/A 08/25/2017   Procedure: COLONOSCOPY WITH PROPOFOL;  Surgeon: Lucilla Lame, MD;  Location: [facility] ENDOSCOPY;  Service: Endoscopy;  Laterality: N/A;  . Lymph Node Removal  1984  . NECK SURGERY  1977   H/O Right neck surgery secondary to a benign growth with when patient was 83 or 57 yo  . skin lesion extraction  2013   basal cell - Dr Phillip Heal  . Uterine Polyps Removed  2004    There were no vitals filed for this visit.  Subjective Assessment - 08/04/18 1816    Subjective  Patient reports the shoulder felt better after the previous appointment but states it conitnues to hurt when using her arm at work.     Pertinent History  Chronic L and R shoulder pain, R LE (quad pain)    Limitations  Lifting    Diagnostic tests  X Ray on L shoulder: No significant changes    Patient Stated Goals  To improve ability to perform overhead activity; less pain with performing ADLs    Currently in Pain?  No/denies    Pain Onset  More than a month ago        TREATMENT Therapeutic Exercise '[employer]' small shoulder circles with patient raised to 90 degrees - x 1.5 min cw/cww B Shoulder ER B in standing with GTB - x 30 Shoulder IR with RTB in standing - x 30   Manual Therapy: STM performed to ant delt, pec major, UT, teres minor, infraspinatus and middle delt with patient positioned in sitting to decrease increased pain and spasms -- x32min  Patient demonstrates improvement in pain at the end of the session  PT Education - 08/04/18 1819    Education Details  form/technique with exercises    Person(s) Educated  Patient    Methods  Explanation;Demonstration    Comprehension  Verbalized understanding;Returned demonstration          PT Long Term Goals - 08/03/18 2218      PT LONG TERM GOAL #1   Title  Patient will improve shoulder ER and IR to full AROM without increase in pain to better be able to turn her car without increase in pain    Baseline  Increased pain with performance; 08/02/2018: L shoulder: full AROM without increase in pain, R shoulder increased pain at end range for both directions    Time  6    Period  Weeks    Status  On-going      PT LONG TERM GOAL #2   Title  Patient will have a worst pain of 1/10 in the shoulder to be able to perform ADLs without increase in shoulder pain.    Baseline  3/10 worst pain; L shoulder: 1/10 worst pain; R shoulder 8/10 worst pain    Time  6    Period  Weeks    Status  On-going      PT LONG TERM GOAL #3   Title  Patient will be independent with HEP to continue benefits of therapy until after discharge.    Baseline  Dependent with form and progression; 08/03/2018: independent for L shoulder exercises, Heavy to moderate cueing to perform R shoulder exercises    Time  6    Period  Weeks    Status  On-going            Plan - 08/04/18 1820    Clinical Impression Statement  Patient demonstrates improvement in pain and spasms after performing manual therapy indicating  decreased muscular spasms and pain. Patient demonstrates no increased aggravation of symptoms with exercise performance but increased mucular fatigue. Patient will benefit from further skilled therapy to return to prior level of function.      Rehab Potential  Good    Clinical Impairments Affecting Rehab Potential  (+) highly active (-) Chronicity    PT Frequency  2x / week    PT Duration  6 weeks    PT Treatment/Interventions  Manual techniques;Neuromuscular re-education;Patient/family education;Therapeutic exercise;Therapeutic activities;Iontophoresis 4mg /ml Dexamethasone;Moist Heat;Cryotherapy;Electrical Stimulation;Ultrasound;Passive range of motion;Dry needling;Joint Manipulations    PT Next Visit Plan  address shoulder IR/ER    PT Home Exercise Plan  See education section    Consulted and Agree with Plan of Care  Patient       Patient will benefit from skilled therapeutic intervention in order to improve the following deficits and impairments:  Decreased range of motion, Decreased endurance, Decreased strength, Decreased balance, Pain, Decreased coordination, Decreased mobility, Decreased activity tolerance, Postural dysfunction, Impaired sensation, Increased fascial restricitons  Visit Diagnosis: Muscle weakness (generalized)  Other muscle spasm  Chronic right shoulder pain     Problem List Patient Active Problem List   Diagnosis Date Noted  . Left shoulder pain 05/23/2018  . Snoring 05/23/2018  . Benign neoplasm of descending colon   . Benign neoplasm of ascending colon   . Vaginal atrophy 03/22/2017  . Meniere's disease 08/17/2016  . Colon cancer screening 08/11/2015  . Fatigue 06/03/2015  . Health care maintenance 06/03/2015  . Neck pain 02/05/2014  . Hypercholesterolemia 02/05/2014  . Back pain 12/05/2012  . Trigger finger 12/05/2012  . Essential hypertension, benign 12/05/2012    Blythe Stanford, PT DPT 08/04/2018, 6:23 PM  [place] [facility] [address] [place], [address] Phone: [phone]   Fax:  [phone]  Name: Sara Perry MRN: [id number] Date of Birth: December 26, 1960

## 2018-08-09 ENCOUNTER — Ambulatory Visit: Payer: 59

## 2018-08-09 DIAGNOSIS — M6281 Muscle weakness (generalized): Secondary | ICD-10-CM | POA: Diagnosis not present

## 2018-08-09 DIAGNOSIS — G8929 Other chronic pain: Secondary | ICD-10-CM

## 2018-08-09 DIAGNOSIS — M25511 Pain in right shoulder: Secondary | ICD-10-CM

## 2018-08-09 DIAGNOSIS — M79651 Pain in right thigh: Secondary | ICD-10-CM | POA: Diagnosis not present

## 2018-08-09 DIAGNOSIS — M62838 Other muscle spasm: Secondary | ICD-10-CM | POA: Diagnosis not present

## 2018-08-09 DIAGNOSIS — M25512 Pain in left shoulder: Secondary | ICD-10-CM | POA: Diagnosis not present

## 2018-08-10 ENCOUNTER — Ambulatory Visit
Admission: RE | Admit: 2018-08-10 | Discharge: 2018-08-10 | Disposition: A | Discharge status: Home / Self Care | Payer: 59 | Source: Ambulatory Visit | Attending: Internal Medicine | Admitting: Internal Medicine

## 2018-08-10 DIAGNOSIS — D485 Neoplasm of uncertain behavior of skin: Secondary | ICD-10-CM | POA: Diagnosis not present

## 2018-08-10 DIAGNOSIS — D225 Melanocytic nevi of trunk: Secondary | ICD-10-CM | POA: Diagnosis not present

## 2018-08-10 DIAGNOSIS — R935 Abnormal findings on diagnostic imaging of other abdominal regions, including retroperitoneum: Secondary | ICD-10-CM | POA: Insufficient documentation

## 2018-08-10 DIAGNOSIS — L821 Other seborrheic keratosis: Secondary | ICD-10-CM | POA: Diagnosis not present

## 2018-08-10 DIAGNOSIS — G4733 Obstructive sleep apnea (adult) (pediatric): Secondary | ICD-10-CM | POA: Diagnosis not present

## 2018-08-10 NOTE — Therapy (Signed)
[facility] [address] [place], [address] Phone: [phone]   Fax:  [phone]  Physical Therapy Treatment  Patient Details  Name: Sara Perry MRN: [id number] Date of Birth: [birth year] Referring Provider (PT): Einar Pheasant MD   Encounter Date: 08/09/2018  PT End of Session - 08/10/18 1234    Visit Number  3    Number of Visits  13    Date for PT Re-Evaluation  09/13/18    PT Start Time  1820    PT Stop Time  1910    PT Time Calculation (min)  50 min    Activity Tolerance  Patient tolerated treatment well    Behavior During Therapy  [facility] for tasks assessed/performed       Past Medical History:  Diagnosis Date  . Eczema   . Frequent UTI   . H/O lymphadenopathy    With previous submandibular node removals.  . Hypertension   . Recurrent sinus infections     Past Surgical History:  Procedure Laterality Date  . COLONOSCOPY WITH PROPOFOL N/A 08/25/2017   Procedure: COLONOSCOPY WITH PROPOFOL;  Surgeon: Lucilla Lame, MD;  Location: [facility] ENDOSCOPY;  Service: Endoscopy;  Laterality: N/A;  . Lymph Node Removal  1984  . NECK SURGERY  1977   H/O Right neck surgery secondary to a benign growth with when patient was 40 or 57 yo  . skin lesion extraction  2013   basal cell - Dr Phillip Heal  . Uterine Polyps Removed  2004    There were no vitals filed for this visit.  Subjective Assessment - 08/10/18 1227    Subjective  Patient reports decreased shoulder pain after the previous session which lasted two days after the treatment session.     Pertinent History  Chronic L and R shoulder pain, R LE (quad pain)    Limitations  Lifting    Diagnostic tests  X Ray on L shoulder: No significant changes    Patient Stated Goals  To improve ability to perform overhead activity; less pain with performing ADLs    Currently in Pain?  No/denies    Pain Onset  More than a month ago       TREATMENT Therapeutic  Exercise Shoulder flexion - x 10 with GTB - x 20  Push up on raised table - x 20  Pec stretch in corner - x 20  Lat pull down in sitting - x 20 25#   Manual Therapy: STM performed to ant delt, pec major, UT, teres minor, infraspinatus and middle delt with patient positioned in sitting to decrease increased pain and spasms - x2min While performing manual therapy: Dry Needling: (4) .25 x 70mm needles placed along the R ant delt and upper trap with patient positioned in supine. Performed R ant delt with patient positioned in supine and upper trap with patient positioned in prone. Patient verbally consents to treatment, educated on risks and benefits of treatment.   Patient demonstrates improvement in pain at the end of the session   PT Education - 08/10/18 1234    Education Details  form/technique with exercise    Person(s) Educated  Patient    Methods  Explanation;Demonstration    Comprehension  Verbalized understanding;Returned demonstration          PT Long Term Goals - 08/03/18 2218      PT LONG TERM GOAL #1   Title  Patient will improve shoulder ER and IR to  full AROM without increase in pain to better be able to turn her car without increase in pain    Baseline  Increased pain with performance; 08/02/2018: L shoulder: full AROM without increase in pain, R shoulder increased pain at end range for both directions    Time  6    Period  Weeks    Status  On-going      PT LONG TERM GOAL #2   Title  Patient will have a worst pain of 1/10 in the shoulder to be able to perform ADLs without increase in shoulder pain.    Baseline  3/10 worst pain; L shoulder: 1/10 worst pain; R shoulder 8/10 worst pain    Time  6    Period  Weeks    Status  On-going      PT LONG TERM GOAL #3   Title  Patient will be independent with HEP to continue benefits of therapy until after discharge.    Baseline  Dependent with form and progression; 08/03/2018: independent for L shoulder exercises, Heavy  to moderate cueing to perform R shoulder exercises    Time  6    Period  Weeks    Status  On-going            Plan - 08/10/18 1235    Clinical Impression Statement  Patient demonstrates singificant improvement in pain and spasms after performing manual therapy and dry needling to the affected side indicating decreased pain and spasms. Patient continues to have increased pain along her ant aspect of her shoulder which increased with performance of shoulder flexion with her affected UE. Although patient is improving, she continues to have increase in pain along her shoulder musculature and will benefit from further skilled therapy to return to prior level of function.     Rehab Potential  Good    Clinical Impairments Affecting Rehab Potential  (+) highly active (-) Chronicity    PT Frequency  2x / week    PT Duration  6 weeks    PT Treatment/Interventions  Manual techniques;Neuromuscular re-education;Patient/family education;Therapeutic exercise;Therapeutic activities;Iontophoresis 4mg /ml Dexamethasone;Moist Heat;Cryotherapy;Electrical Stimulation;Ultrasound;Passive range of motion;Dry needling;Joint Manipulations    PT Next Visit Plan  address shoulder IR/ER    PT Home Exercise Plan  See education section    Consulted and Agree with Plan of Care  Patient       Patient will benefit from skilled therapeutic intervention in order to improve the following deficits and impairments:  Decreased range of motion, Decreased endurance, Decreased strength, Decreased balance, Pain, Decreased coordination, Decreased mobility, Decreased activity tolerance, Postural dysfunction, Impaired sensation, Increased fascial restricitons  Visit Diagnosis: Muscle weakness (generalized)  Other muscle spasm  Chronic right shoulder pain     Problem List Patient Active Problem List   Diagnosis Date Noted  . Left shoulder pain 05/23/2018  . Snoring 05/23/2018  . Benign neoplasm of descending colon   .  Benign neoplasm of ascending colon   . Vaginal atrophy 03/22/2017  . Meniere's disease 08/17/2016  . Colon cancer screening 08/11/2015  . Fatigue 06/03/2015  . Health care maintenance 06/03/2015  . Neck pain 02/05/2014  . Hypercholesterolemia 02/05/2014  . Back pain 12/05/2012  . Trigger finger 12/05/2012  . Essential hypertension, benign 12/05/2012    Blythe Stanford, PT DPT 08/10/2018, 12:39 PM  [facility] [address] [place], [address] Phone: [phone]   Fax:  [phone]  Name: Sara Perry MRN: [id number] Date  of Birth: [birth year]

## 2018-08-11 ENCOUNTER — Encounter: Payer: Self-pay | Admitting: Internal Medicine

## 2018-08-11 ENCOUNTER — Ambulatory Visit: Payer: 59

## 2018-08-11 DIAGNOSIS — M79651 Pain in right thigh: Secondary | ICD-10-CM | POA: Diagnosis not present

## 2018-08-11 DIAGNOSIS — G8929 Other chronic pain: Secondary | ICD-10-CM | POA: Diagnosis not present

## 2018-08-11 DIAGNOSIS — M25511 Pain in right shoulder: Secondary | ICD-10-CM | POA: Diagnosis not present

## 2018-08-11 DIAGNOSIS — M62838 Other muscle spasm: Secondary | ICD-10-CM | POA: Diagnosis not present

## 2018-08-11 DIAGNOSIS — M6281 Muscle weakness (generalized): Secondary | ICD-10-CM | POA: Diagnosis not present

## 2018-08-11 DIAGNOSIS — M25512 Pain in left shoulder: Secondary | ICD-10-CM | POA: Diagnosis not present

## 2018-08-11 NOTE — Therapy (Signed)
[facility] [address] [place], [address] Phone: [phone]   Fax:  [phone]  Physical Therapy Treatment  Patient Details  Name: Sara Perry MRN: [id number] Date of Birth: 1960/10/25 Referring Provider (PT): Einar Pheasant MD   Encounter Date: 08/11/2018  PT End of Session - 08/11/18 1805    Visit Number  4    Number of Visits  13    Date for PT Re-Evaluation  09/13/18    PT Start Time  8242    PT Stop Time  1805    PT Time Calculation (min)  50 min    Activity Tolerance  Patient tolerated treatment well    Behavior During Therapy  [facility] for tasks assessed/performed       Past Medical History:  Diagnosis Date  . Eczema   . Frequent UTI   . H/O lymphadenopathy    With previous submandibular node removals.  . Hypertension   . Recurrent sinus infections     Past Surgical History:  Procedure Laterality Date  . COLONOSCOPY WITH PROPOFOL N/A 08/25/2017   Procedure: COLONOSCOPY WITH PROPOFOL;  Surgeon: Lucilla Lame, MD;  Location: [facility] ENDOSCOPY;  Service: Endoscopy;  Laterality: N/A;  . Lymph Node Removal  1984  . NECK SURGERY  1977   H/O Right neck surgery secondary to a benign growth with when patient was 45 or 57 yo  . skin lesion extraction  2013   basal cell - Dr Phillip Heal  . Uterine Polyps Removed  2004    There were no vitals filed for this visit.  Subjective Assessment - 08/11/18 1758    Subjective  Patient reports the pain is improving overall. Patient statesshe continues to have increased pain along the ant aspection of her shoulder.     Pertinent History  Chronic L and R shoulder pain, R LE (quad pain)    Limitations  Lifting    Diagnostic tests  X Ray on L shoulder: No significant changes    Patient Stated Goals  To improve ability to perform overhead activity; less pain with performing ADLs    Currently in Pain?  No/denies    Pain Onset  More than a month ago         TREATMENT Therapeutic Exercise Shoulder flexion  with GTB - x 30  Body Blade in standing up/down with shoulder flexed to 90 -- x 45 sec B Body Blade with elbow at side for shoulder IR/ER -- x 1 min B  Shoulder adduction with RTB -- x 30 on R  Manual Therapy: STM performed to ant delt UT, teres minorand middle delt with patient positioned in sitting to decrease increased pain and spasms - x46min While performing manual therapy: Dry Needling: (2) .25 x 29mm needles placed along the R ant delt with patient positioned in supine. Performed R ant delt with patient positioned in supine and upper trap with patient positioned in prone. Patient verbally consents to treatment, educated on risks and benefits of treatment.   Patient demonstrates increased fatigue at the end of the session    PT Education - 08/11/18 1804    Education Details  form/technique with exercise; HEP: shoulder adduction in standing     Person(s) Educated  Patient    Methods  Explanation;Demonstration    Comprehension  Verbalized understanding;Returned demonstration          PT Long Term Goals - 08/03/18 2218      PT LONG TERM  GOAL #1   Title  Patient will improve shoulder ER and IR to full AROM without increase in pain to better be able to turn her car without increase in pain    Baseline  Increased pain with performance; 08/02/2018: L shoulder: full AROM without increase in pain, R shoulder increased pain at end range for both directions    Time  6    Period  Weeks    Status  On-going      PT LONG TERM GOAL #2   Title  Patient will have a worst pain of 1/10 in the shoulder to be able to perform ADLs without increase in shoulder pain.    Baseline  3/10 worst pain; L shoulder: 1/10 worst pain; R shoulder 8/10 worst pain    Time  6    Period  Weeks    Status  On-going      PT LONG TERM GOAL #3   Title  Patient will be independent with HEP to continue benefits of therapy until after discharge.    Baseline   Dependent with form and progression; 08/03/2018: independent for L shoulder exercises, Heavy to moderate cueing to perform R shoulder exercises    Time  6    Period  Weeks    Status  On-going            Plan - 08/11/18 1808    Clinical Impression Statement  Patient demonstrates improvement in pain with performing overhead lifting after performing manual therapy and dry neelding indcating functional improvement and decrease in musular spasms most notably along the ant delt. Although patient is improving, she continues to have increase pain with stretching activities indicating poo motor control and continued spasms. Patient will benefit from further skilled therapy to return to prior level of function.     Rehab Potential  Good    Clinical Impairments Affecting Rehab Potential  (+) highly active (-) Chronicity    PT Frequency  2x / week    PT Duration  6 weeks    PT Treatment/Interventions  Manual techniques;Neuromuscular re-education;Patient/family education;Therapeutic exercise;Therapeutic activities;Iontophoresis 4mg /ml Dexamethasone;Moist Heat;Cryotherapy;Electrical Stimulation;Ultrasound;Passive range of motion;Dry needling;Joint Manipulations    PT Next Visit Plan  address shoulder IR/ER    PT Home Exercise Plan  See education section    Consulted and Agree with Plan of Care  Patient       Patient will benefit from skilled therapeutic intervention in order to improve the following deficits and impairments:  Decreased range of motion, Decreased endurance, Decreased strength, Decreased balance, Pain, Decreased coordination, Decreased mobility, Decreased activity tolerance, Postural dysfunction, Impaired sensation, Increased fascial restricitons  Visit Diagnosis: Muscle weakness (generalized)  Other muscle spasm  Chronic right shoulder pain     Problem List Patient Active Problem List   Diagnosis Date Noted  . Left shoulder pain 05/23/2018  . Snoring 05/23/2018  . Benign  neoplasm of descending colon   . Benign neoplasm of ascending colon   . Vaginal atrophy 03/22/2017  . Meniere's disease 08/17/2016  . Colon cancer screening 08/11/2015  . Fatigue 06/03/2015  . Health care maintenance 06/03/2015  . Neck pain 02/05/2014  . Hypercholesterolemia 02/05/2014  . Back pain 12/05/2012  . Trigger finger 12/05/2012  . Essential hypertension, benign 12/05/2012    Blythe Stanford, PT DPT 08/11/2018, 6:11 PM  [facility] [address] [place], [address] Phone: [phone]   Fax:  [phone]  Name: Sara Perry MRN: [id number]  Date of Birth: Oct 16, 1960

## 2018-08-17 ENCOUNTER — Ambulatory Visit: Payer: 59 | Attending: Internal Medicine

## 2018-08-17 DIAGNOSIS — M79652 Pain in left thigh: Secondary | ICD-10-CM | POA: Insufficient documentation

## 2018-08-17 DIAGNOSIS — M25512 Pain in left shoulder: Secondary | ICD-10-CM | POA: Insufficient documentation

## 2018-08-17 DIAGNOSIS — G8929 Other chronic pain: Secondary | ICD-10-CM | POA: Diagnosis not present

## 2018-08-17 DIAGNOSIS — M79651 Pain in right thigh: Secondary | ICD-10-CM | POA: Diagnosis not present

## 2018-08-17 DIAGNOSIS — M62838 Other muscle spasm: Secondary | ICD-10-CM | POA: Insufficient documentation

## 2018-08-17 DIAGNOSIS — M6281 Muscle weakness (generalized): Secondary | ICD-10-CM | POA: Insufficient documentation

## 2018-08-17 DIAGNOSIS — M25511 Pain in right shoulder: Secondary | ICD-10-CM | POA: Diagnosis not present

## 2018-08-18 NOTE — Therapy (Addendum)
[facility] [address] [place], [address] Phone: [phone]   Fax:  [phone]  Physical Therapy Treatment  Patient Details  Name: Sara Perry MRN: [id number] Date of Birth: 1961-07-22 Referring Provider (PT): Einar Pheasant MD   Encounter Date: 08/17/2018  PT End of Session - 08/18/18 1247    Visit Number  5    Number of Visits  13    Date for PT Re-Evaluation  09/13/18    PT Start Time  1628    PT Stop Time  1700    PT Time Calculation (min)  32 min    Activity Tolerance  Patient tolerated treatment well    Behavior During Therapy  [facility] for tasks assessed/performed       Past Medical History:  Diagnosis Date  . Eczema   . Frequent UTI   . H/O lymphadenopathy    With previous submandibular node removals.  . Hypertension   . Recurrent sinus infections     Past Surgical History:  Procedure Laterality Date  . COLONOSCOPY WITH PROPOFOL N/A 08/25/2017   Procedure: COLONOSCOPY WITH PROPOFOL;  Surgeon: Lucilla Lame, MD;  Location: [facility] ENDOSCOPY;  Service: Endoscopy;  Laterality: N/A;  . Lymph Node Removal  1984  . NECK SURGERY  1977   H/O Right neck surgery secondary to a benign growth with when patient was 72 or 57 yo  . skin lesion extraction  2013   basal cell - Dr Phillip Heal  . Uterine Polyps Removed  2004    There were no vitals filed for this visit.  Subjective Assessment - 08/17/18 1900    Subjective  Patient continues to have increased pain but states overall the pain has improved. Patient states she has increased pain along lateral aspect of her shoulder and along her UT.     Pertinent History  Chronic L and R shoulder pain, R LE (quad pain)    Limitations  Lifting    Diagnostic tests  X Ray on L shoulder: No significant changes    Patient Stated Goals  To improve ability to perform overhead activity; less pain with performing ADLs    Currently in Pain?  No/denies    Pain Onset   More than a month ago       TREATMENT Therapeutic Exercise  Chest on physioball in quadruped - overhead flexion -- x 20  T's with chest on physioball in quadruped 2# weights - x 20  Body Blade in standing up/down with shoulder flexed to 90 -- x 45 sec B Body Blade with elbow at side for shoulder IR/ER -- x 1 min B  Shoulder abduction with GTB -- x 30 on R Scapular shoulder rowa with GTB with elbow extension B - x 10    Manual Therapy: STM performed to ant delt UT, teres minorand middle delt with patient positioned in sitting to decrease increased pain and spasms - x23min While performing manual therapy: Dry Needling: (2) .3 x 10mm needles placed along the R ant delt with patient positioned in supine. Performed R ant delt with patient positioned in supine and upper trap with patient positioned in prone. Patient verbally consents to treatment, educated on risks and benefits of treatment.  Patient demonstrates decrease in pain at the end of the session   PT Education - 08/18/18 1247    Education Details  Form/technique with exercise    Person(s) Educated  Patient    Methods  Explanation;Demonstration  Comprehension  Verbalized understanding;Returned demonstration          PT Long Term Goals - 08/03/18 2218      PT LONG TERM GOAL #1   Title  Patient will improve shoulder ER and IR to full AROM without increase in pain to better be able to turn her car without increase in pain    Baseline  Increased pain with performance; 08/02/2018: L shoulder: full AROM without increase in pain, R shoulder increased pain at end range for both directions    Time  6    Period  Weeks    Status  On-going      PT LONG TERM GOAL #2   Title  Patient will have a worst pain of 1/10 in the shoulder to be able to perform ADLs without increase in shoulder pain.    Baseline  3/10 worst pain; L shoulder: 1/10 worst pain; R shoulder 8/10 worst pain    Time  6    Period  Weeks    Status  On-going       PT LONG TERM GOAL #3   Title  Patient will be independent with HEP to continue benefits of therapy until after discharge.    Baseline  Dependent with form and progression; 08/03/2018: independent for L shoulder exercises, Heavy to moderate cueing to perform R shoulder exercises    Time  6    Period  Weeks    Status  On-going            Plan - 08/18/18 1247    Clinical Impression Statement  Patient demonstrates increased local twitch responses along the affected side most notably along mid delt during dry needling and patient reports alleviation of pain afterwards. Although pain is improving, she conitnues to have increased pain along her UT and will benefit from further skilled therapy to return to prior level of function.     Rehab Potential  Good    Clinical Impairments Affecting Rehab Potential  (+) highly active (-) Chronicity    PT Frequency  2x / week    PT Duration  6 weeks    PT Treatment/Interventions  Manual techniques;Neuromuscular re-education;Patient/family education;Therapeutic exercise;Therapeutic activities;Iontophoresis 4mg /ml Dexamethasone;Moist Heat;Cryotherapy;Electrical Stimulation;Ultrasound;Passive range of motion;Dry needling;Joint Manipulations    PT Next Visit Plan  address shoulder IR/ER    PT Home Exercise Plan  See education section    Consulted and Agree with Plan of Care  Patient       Patient will benefit from skilled therapeutic intervention in order to improve the following deficits and impairments:  Decreased range of motion, Decreased endurance, Decreased strength, Decreased balance, Pain, Decreased coordination, Decreased mobility, Decreased activity tolerance, Postural dysfunction, Impaired sensation, Increased fascial restricitons  Visit Diagnosis: Muscle weakness (generalized)  Other muscle spasm  Chronic right shoulder pain  Chronic left shoulder pain     Problem List Patient Active Problem List   Diagnosis Date Noted  . Left  shoulder pain 05/23/2018  . Snoring 05/23/2018  . Benign neoplasm of descending colon   . Benign neoplasm of ascending colon   . Vaginal atrophy 03/22/2017  . Meniere's disease 08/17/2016  . Colon cancer screening 08/11/2015  . Fatigue 06/03/2015  . Health care maintenance 06/03/2015  . Neck pain 02/05/2014  . Hypercholesterolemia 02/05/2014  . Back pain 12/05/2012  . Trigger finger 12/05/2012  . Essential hypertension, benign 12/05/2012    Blythe Stanford, PT DPT 08/18/2018, 12:49 PM  [facility] [address], [place], [address] Phone: [phone]   Fax:  [phone]  Name: Sara Perry MRN: [id number] Date of Birth: 1961/01/22

## 2018-08-19 ENCOUNTER — Ambulatory Visit: Payer: 59

## 2018-08-19 DIAGNOSIS — M6281 Muscle weakness (generalized): Secondary | ICD-10-CM

## 2018-08-19 DIAGNOSIS — M62838 Other muscle spasm: Secondary | ICD-10-CM

## 2018-08-19 DIAGNOSIS — M25511 Pain in right shoulder: Secondary | ICD-10-CM | POA: Diagnosis not present

## 2018-08-19 DIAGNOSIS — M79652 Pain in left thigh: Secondary | ICD-10-CM | POA: Diagnosis not present

## 2018-08-19 DIAGNOSIS — M25512 Pain in left shoulder: Secondary | ICD-10-CM | POA: Diagnosis not present

## 2018-08-19 DIAGNOSIS — G8929 Other chronic pain: Secondary | ICD-10-CM | POA: Diagnosis not present

## 2018-08-19 DIAGNOSIS — M79651 Pain in right thigh: Secondary | ICD-10-CM | POA: Diagnosis not present

## 2018-08-19 NOTE — Therapy (Addendum)
[facility] [address], [place], [address] Phone: [phone]   Fax:  [phone]  Physical Therapy Treatment  Patient Details  Name: Sara Perry MRN: [id number] Date of Birth: 1961-08-19 Referring Provider (PT): Einar Pheasant MD   Encounter Date: 08/19/2018  PT End of Session - 08/19/18 1726    Visit Number  6    Number of Visits  13    Date for PT Re-Evaluation  09/13/18    PT Start Time  7169    PT Stop Time  1515    PT Time Calculation (min)  50 min    Activity Tolerance  Patient tolerated treatment well    Behavior During Therapy  [facility] for tasks assessed/performed       Past Medical History:  Diagnosis Date  . Eczema   . Frequent UTI   . H/O lymphadenopathy    With previous submandibular node removals.  . Hypertension   . Recurrent sinus infections     Past Surgical History:  Procedure Laterality Date  . COLONOSCOPY WITH PROPOFOL N/A 08/25/2017   Procedure: COLONOSCOPY WITH PROPOFOL;  Surgeon: Lucilla Lame, MD;  Location: [facility] ENDOSCOPY;  Service: Endoscopy;  Laterality: N/A;  . Lymph Node Removal  1984  . NECK SURGERY  1977   H/O Right neck surgery secondary to a benign growth with when patient was 79 or 57 yo  . skin lesion extraction  2013   basal cell - Dr Phillip Heal  . Uterine Polyps Removed  2004    There were no vitals filed for this visit.  Subjective Assessment - 08/19/18 1706    Subjective  Patient reports she has pain along the proximal aspect of her UT on the R and minimal pain along the R middle delt.    Pertinent History  Chronic L and R shoulder pain, R LE (quad pain)    Limitations  Lifting    Diagnostic tests  X Ray on L shoulder: No significant changes    Patient Stated Goals  To improve ability to perform overhead activity; less pain with performing ADLs    Currently in Pain?  No/denies    Pain Onset  More than a month ago       TREATMENT Therapeutic  Exercise                            Straight arm push downs in standing -- x 20  Standing B shoulder ER with YTB -- x 5  Shoulder adduction with GTB -- x 30 on R Scapular shoulder rows with OMEGA -- x 20 15#  Manual Therapy: STM performed to ant delt UT, teres minorand middle delt with patient positioned in sitting to decrease increased pain and spasms -x60min Thoracic mobs T3-10 grade III-II to improve pain and spasms for 5 min to decrease increased pain and spasms  While performing manual therapy:  Dry Needling: (4) .3 x 41mm needles placed along the R ant delt and UT with patient positioned in supine. Performed to R ant delt with patient positioned in supine and upper trap with patient positioned in prone. Patient verbally consents to treatment, educated on risks and benefits of treatment.  Patient demonstrates decrease in pain at the end of the session    PT Education - 08/19/18 1725    Education Details  HEP: B shoulder ER, shoulder flexion with GTB,     Person(s) Educated  Patient    Methods  Explanation;Demonstration    Comprehension  Verbalized understanding;Returned demonstration          PT Long Term Goals - 08/03/18 2218      PT LONG TERM GOAL #1   Title  Patient will improve shoulder ER and IR to full AROM without increase in pain to better be able to turn her car without increase in pain    Baseline  Increased pain with performance; 08/02/2018: L shoulder: full AROM without increase in pain, R shoulder increased pain at end range for both directions    Time  6    Period  Weeks    Status  On-going      PT LONG TERM GOAL #2   Title  Patient will have a worst pain of 1/10 in the shoulder to be able to perform ADLs without increase in shoulder pain.    Baseline  3/10 worst pain; L shoulder: 1/10 worst pain; R shoulder 8/10 worst pain    Time  6    Period  Weeks    Status  On-going      PT LONG TERM GOAL #3   Title  Patient will be independent with HEP to  continue benefits of therapy until after discharge.    Baseline  Dependent with form and progression; 08/03/2018: independent for L shoulder exercises, Heavy to moderate cueing to perform R shoulder exercises    Time  6    Period  Weeks    Status  On-going            Plan - 08/19/18 1727    Clinical Impression Statement  Patient demonstrates improvement with exercises however continues to demosntrate increased pain along UT. Patient demonstrates significant weakness with shoulder ER most notably with overhead motions limiting ability to perform occupational tasks. Patient will benefit from furhter skilled therapy to return to prior level of function.     Rehab Potential  Good    Clinical Impairments Affecting Rehab Potential  (+) highly active (-) Chronicity    PT Frequency  2x / week    PT Duration  6 weeks    PT Treatment/Interventions  Manual techniques;Neuromuscular re-education;Patient/family education;Therapeutic exercise;Therapeutic activities;Iontophoresis 4mg /ml Dexamethasone;Moist Heat;Cryotherapy;Electrical Stimulation;Ultrasound;Passive range of motion;Dry needling;Joint Manipulations    PT Next Visit Plan  address shoulder IR/ER    PT Home Exercise Plan  See education section    Consulted and Agree with Plan of Care  Patient       Patient will benefit from skilled therapeutic intervention in order to improve the following deficits and impairments:  Decreased range of motion, Decreased endurance, Decreased strength, Decreased balance, Pain, Decreased coordination, Decreased mobility, Decreased activity tolerance, Postural dysfunction, Impaired sensation, Increased fascial restricitons  Visit Diagnosis: Muscle weakness (generalized)  Other muscle spasm  Chronic right shoulder pain     Problem List Patient Active Problem List   Diagnosis Date Noted  . Left shoulder pain 05/23/2018  . Snoring 05/23/2018  . Benign neoplasm of descending colon   . Benign neoplasm of  ascending colon   . Vaginal atrophy 03/22/2017  . Meniere's disease 08/17/2016  . Colon cancer screening 08/11/2015  . Fatigue 06/03/2015  . Health care maintenance 06/03/2015  . Neck pain 02/05/2014  . Hypercholesterolemia 02/05/2014  . Back pain 12/05/2012  . Trigger finger 12/05/2012  . Essential hypertension, benign 12/05/2012    Blythe Stanford, PT DPT 08/19/2018, 5:28 PM  [facility] [address], [place], [address] Phone: [phone]   Fax:  [phone]  Name: Sara Perry MRN: [id number] Date of Birth: 1960-11-21

## 2018-08-25 ENCOUNTER — Ambulatory Visit: Payer: 59

## 2018-08-26 ENCOUNTER — Ambulatory Visit: Payer: 59

## 2018-08-26 DIAGNOSIS — M79651 Pain in right thigh: Secondary | ICD-10-CM | POA: Diagnosis not present

## 2018-08-26 DIAGNOSIS — M25512 Pain in left shoulder: Secondary | ICD-10-CM

## 2018-08-26 DIAGNOSIS — M6281 Muscle weakness (generalized): Secondary | ICD-10-CM | POA: Diagnosis not present

## 2018-08-26 DIAGNOSIS — G8929 Other chronic pain: Secondary | ICD-10-CM | POA: Diagnosis not present

## 2018-08-26 DIAGNOSIS — M25511 Pain in right shoulder: Secondary | ICD-10-CM | POA: Diagnosis not present

## 2018-08-26 DIAGNOSIS — M79652 Pain in left thigh: Secondary | ICD-10-CM | POA: Diagnosis not present

## 2018-08-26 DIAGNOSIS — M62838 Other muscle spasm: Secondary | ICD-10-CM

## 2018-08-26 NOTE — Therapy (Signed)
[facility] [address] [place], [address] Phone: [phone]   Fax:  [phone]  Physical Therapy Treatment  Patient Details  Name: Sara Perry MRN: [id number] Date of Birth: 1961-03-18 Referring Provider (PT): Einar Pheasant MD   Encounter Date: 08/26/2018  PT End of Session - 08/26/18 1811    Visit Number  7    Number of Visits  13    Date for PT Re-Evaluation  09/13/18    PT Start Time  1435    PT Stop Time  1500    PT Time Calculation (min)  25 min    Activity Tolerance  Patient tolerated treatment well    Behavior During Therapy  [facility] for tasks assessed/performed       Past Medical History:  Diagnosis Date  . Eczema   . Frequent UTI   . H/O lymphadenopathy    With previous submandibular node removals.  . Hypertension   . Recurrent sinus infections     Past Surgical History:  Procedure Laterality Date  . COLONOSCOPY WITH PROPOFOL N/A 08/25/2017   Procedure: COLONOSCOPY WITH PROPOFOL;  Surgeon: Lucilla Lame, MD;  Location: [facility] ENDOSCOPY;  Service: Endoscopy;  Laterality: N/A;  . Lymph Node Removal  1984  . NECK SURGERY  1977   H/O Right neck surgery secondary to a benign growth with when patient was 8 or 57 yo  . skin lesion extraction  2013   basal cell - Dr Phillip Heal  . Uterine Polyps Removed  2004    There were no vitals filed for this visit.  Subjective Assessment - 08/26/18 1810    Subjective  Patient reports her shoulder is feeling better overall, however, continues to have increased pain along UT and lateral aspect of the L shoulder.     Pertinent History  Chronic L and R shoulder pain, R LE (quad pain)    Limitations  Lifting    Diagnostic tests  X Ray on L shoulder: No significant changes    Patient Stated Goals  To improve ability to perform overhead activity; less pain with performing ADLs    Currently in Pain?  No/denies    Pain Onset  More than a month ago          Manual Therapy: STM performed to ant delt UT, teres minorand middle delt with patient positioned in sitting to decrease increased pain and spasms -x66min While performing manual therapy:  Dry Needling: (2) .3 x 51mm needles placed along the R ant delt with patient positioned in supine. Performed R ant delt with patient positioned in supine and upper trap with patient positioned in prone. Patient verbally consents to treatment, educated on risks and benefits of treatment.  Patient demonstrates decrease in pain at the end of the session     PT Education - 08/26/18 1810    Education Details  Educated to continue with HEP    Person(s) Educated  Patient    Methods  Explanation;Demonstration    Comprehension  Verbalized understanding;Returned demonstration          PT Long Term Goals - 08/03/18 2218      PT LONG TERM GOAL #1   Title  Patient will improve shoulder ER and IR to full AROM without increase in pain to better be able to turn her car without increase in pain    Baseline  Increased pain with performance; 08/02/2018: L shoulder: full AROM without increase in pain, R shoulder  increased pain at end range for both directions    Time  6    Period  Weeks    Status  On-going      PT LONG TERM GOAL #2   Title  Patient will have a worst pain of 1/10 in the shoulder to be able to perform ADLs without increase in shoulder pain.    Baseline  3/10 worst pain; L shoulder: 1/10 worst pain; R shoulder 8/10 worst pain    Time  6    Period  Weeks    Status  On-going      PT LONG TERM GOAL #3   Title  Patient will be independent with HEP to continue benefits of therapy until after discharge.    Baseline  Dependent with form and progression; 08/03/2018: independent for L shoulder exercises, Heavy to moderate cueing to perform R shoulder exercises    Time  6    Period  Weeks    Status  On-going            Plan - 08/26/18 1811    Clinical Impression Statement  Shortened  session today secondary to patient arriving late for appointment. Foucsed on performing manual therapy with dry needling as patient continues to have muscular guarding and spasms with HEP. Patient demonstrates improvement after performing dry needling indicating decreased muscular spasms. Patient will benefit from further skilled therapy to return to prior level of function.     Rehab Potential  Good    Clinical Impairments Affecting Rehab Potential  (+) highly active (-) Chronicity    PT Frequency  2x / week    PT Duration  6 weeks    PT Treatment/Interventions  Manual techniques;Neuromuscular re-education;Patient/family education;Therapeutic exercise;Therapeutic activities;Iontophoresis 4mg /ml Dexamethasone;Moist Heat;Cryotherapy;Electrical Stimulation;Ultrasound;Passive range of motion;Dry needling;Joint Manipulations    PT Next Visit Plan  address shoulder IR/ER    PT Home Exercise Plan  See education section    Consulted and Agree with Plan of Care  Patient       Patient will benefit from skilled therapeutic intervention in order to improve the following deficits and impairments:  Decreased range of motion, Decreased endurance, Decreased strength, Decreased balance, Pain, Decreased coordination, Decreased mobility, Decreased activity tolerance, Postural dysfunction, Impaired sensation, Increased fascial restricitons  Visit Diagnosis: Muscle weakness (generalized)  Other muscle spasm  Chronic right shoulder pain  Chronic left shoulder pain     Problem List Patient Active Problem List   Diagnosis Date Noted  . Left shoulder pain 05/23/2018  . Snoring 05/23/2018  . Benign neoplasm of descending colon   . Benign neoplasm of ascending colon   . Vaginal atrophy 03/22/2017  . Meniere's disease 08/17/2016  . Colon cancer screening 08/11/2015  . Fatigue 06/03/2015  . Health care maintenance 06/03/2015  . Neck pain 02/05/2014  . Hypercholesterolemia 02/05/2014  . Back pain  12/05/2012  . Trigger finger 12/05/2012  . Essential hypertension, benign 12/05/2012    Blythe Stanford, PT DPT 08/26/2018, 6:16 PM  [facility] [address] [place], [address] Phone: [phone]   Fax:  [phone]  Name: Vickey Boak MRN: [id number] Date of Birth: [birth year]

## 2018-08-30 ENCOUNTER — Ambulatory Visit: Payer: 59

## 2018-08-30 DIAGNOSIS — M6281 Muscle weakness (generalized): Secondary | ICD-10-CM

## 2018-08-30 DIAGNOSIS — M25512 Pain in left shoulder: Secondary | ICD-10-CM | POA: Diagnosis not present

## 2018-08-30 DIAGNOSIS — G8929 Other chronic pain: Secondary | ICD-10-CM | POA: Diagnosis not present

## 2018-08-30 DIAGNOSIS — M79651 Pain in right thigh: Secondary | ICD-10-CM | POA: Diagnosis not present

## 2018-08-30 DIAGNOSIS — M25511 Pain in right shoulder: Secondary | ICD-10-CM

## 2018-08-30 DIAGNOSIS — M79652 Pain in left thigh: Secondary | ICD-10-CM

## 2018-08-30 DIAGNOSIS — M62838 Other muscle spasm: Secondary | ICD-10-CM

## 2018-08-30 NOTE — Therapy (Signed)
[facility] [address], [place], [address] Phone: [phone]   Fax:  [phone]  Physical Therapy Treatment  Patient Details  Name: Sara Perry MRN: [id number] Date of Birth: 07/20/1961 Referring Provider (PT): Einar Pheasant MD   Encounter Date: 08/30/2018  PT End of Session - 08/30/18 1705    Visit Number  8    Number of Visits  13    Date for PT Re-Evaluation  09/13/18    PT Start Time  1706    PT Stop Time  1752    PT Time Calculation (min)  46 min    Activity Tolerance  Patient tolerated treatment well    Behavior During Therapy  [facility] for tasks assessed/performed       Past Medical History:  Diagnosis Date  . Eczema   . Frequent UTI   . H/O lymphadenopathy    With previous submandibular node removals.  . Hypertension   . Recurrent sinus infections     Past Surgical History:  Procedure Laterality Date  . COLONOSCOPY WITH PROPOFOL N/A 08/25/2017   Procedure: COLONOSCOPY WITH PROPOFOL;  Surgeon: Lucilla Lame, MD;  Location: [facility] ENDOSCOPY;  Service: Endoscopy;  Laterality: N/A;  . Lymph Node Removal  1984  . NECK SURGERY  1977   H/O Right neck surgery secondary to a benign growth with when patient was 57 or 56 yo  . skin lesion extraction  2013   basal cell - Dr Phillip Heal  . Uterine Polyps Removed  2004    There were no vitals filed for this visit.  Subjective Assessment - 08/30/18 1709    Subjective  Patient reports that she still has complaints of bilateral anterior shoulder muscle tension, as well as trigger points of superior R shoulder    Pertinent History  Chronic L and R shoulder pain, R LE (quad pain)    Limitations  Lifting    Patient Stated Goals  To improve ability to perform overhead activity; less pain with performing ADLs    Currently in Pain?  Yes    Pain Score  2     Pain Orientation  Right;Left   R > L   Pain Onset  More than a month ago         TREATMENT Therapeutic Exercise Standing bicep stretch on bilaterally 2x30s ER reactive isometrics (walk outs) with BTB x10 RUE GTB x10 on LUE Body Blade in standing up/down with shoulder flexed to 90 -- x 30sec sec B Body Blade with elbow at side for shoulder IR/ER -- x 1 min B  Scapular shoulder rows with OMEGA -- x25# x20  Manual Therapy: STM performed to ant delt UT, teres minorand middle delt with patient positioned in sitting to decrease increased pain and spasms -x27min Thoracic mobs T3-10 grade III-II to improve pain and spasms for 5 min to decrease increased pain and spasms, 1 cavitation noted    PT Education - 08/30/18 1704    Education Details  therapeutic exercise technique/form    Person(s) Educated  Patient    Methods  Explanation;Demonstration    Comprehension  Verbalized understanding          PT Long Term Goals - 08/03/18 2218      PT LONG TERM GOAL #1   Title  Patient will improve shoulder ER and IR to full AROM without increase in pain to better be able to turn her car without increase in pain  Baseline  Increased pain with performance; 08/02/2018: L shoulder: full AROM without increase in pain, R shoulder increased pain at end range for both directions    Time  6    Period  Weeks    Status  On-going      PT LONG TERM GOAL #2   Title  Patient will have a worst pain of 1/10 in the shoulder to be able to perform ADLs without increase in shoulder pain.    Baseline  3/10 worst pain; L shoulder: 1/10 worst pain; R shoulder 8/10 worst pain    Time  6    Period  Weeks    Status  On-going      PT LONG TERM GOAL #3   Title  Patient will be independent with HEP to continue benefits of therapy until after discharge.    Baseline  Dependent with form and progression; 08/03/2018: independent for L shoulder exercises, Heavy to moderate cueing to perform R shoulder exercises    Time  6    Period  Weeks    Status  On-going               Patient will benefit from skilled therapeutic intervention in order to improve the following deficits and impairments:     Visit Diagnosis: Muscle weakness (generalized)  Other muscle spasm  Chronic right shoulder pain  Chronic left shoulder pain  Pain in left thigh  Pain in right thigh     Problem List Patient Active Problem List   Diagnosis Date Noted  . Left shoulder pain 05/23/2018  . Snoring 05/23/2018  . Benign neoplasm of descending colon   . Benign neoplasm of ascending colon   . Vaginal atrophy 03/22/2017  . Meniere's disease 08/17/2016  . Colon cancer screening 08/11/2015  . Fatigue 06/03/2015  . Health care maintenance 06/03/2015  . Neck pain 02/05/2014  . Hypercholesterolemia 02/05/2014  . Back pain 12/05/2012  . Trigger finger 12/05/2012  . Essential hypertension, benign 12/05/2012    Lieutenant Diego PT, DPT 5:56 PM,08/30/18 [facility] [address], [place], [address] Phone: [phone]   Fax:  [phone]  Name: Cynthia Cogle MRN: [id number] Date of Birth: 07/20/1961

## 2018-08-31 ENCOUNTER — Ambulatory Visit (INDEPENDENT_AMBULATORY_CARE_PROVIDER_SITE_OTHER): Payer: 59 | Admitting: Internal Medicine

## 2018-08-31 DIAGNOSIS — I10 Essential (primary) hypertension: Secondary | ICD-10-CM

## 2018-08-31 DIAGNOSIS — E7800 Pure hypercholesterolemia, unspecified: Secondary | ICD-10-CM | POA: Diagnosis not present

## 2018-08-31 DIAGNOSIS — M25512 Pain in left shoulder: Secondary | ICD-10-CM

## 2018-08-31 NOTE — Progress Notes (Signed)
[name], female   DOB: Jul 12, 1961, 57 y.o.   MRN: [id number]   Subjective:    [name], female    DOB: 11/17/1960, 57 y.o.   MRN: [id number]  HPI  Patient here for a scheduled follow up.  She reports she is doing well.  Shoulder is doing better.  Has been going to PT.  S/p dry needling.  Helped.  Overall feels she is doing well.  Using cpap for one month now.  Doing well with this.  No chest pain.  No sob.  No acid reflux.  No abdominal pain.  Bowels moving. Handling stress.     Past Medical History:  Diagnosis Date  . Eczema   . Frequent UTI   . H/O lymphadenopathy    With previous submandibular node removals.  . Hypertension   . Recurrent sinus infections    Past Surgical History:  Procedure Laterality Date  . COLONOSCOPY WITH PROPOFOL N/A 08/25/2017   Procedure: COLONOSCOPY WITH PROPOFOL;  Surgeon: Lucilla Lame, MD;  Location: [facility] ENDOSCOPY;  Service: Endoscopy;  Laterality: N/A;  . Lymph Node Removal  1984  . NECK SURGERY  1977   H/O Right neck surgery secondary to a benign growth with when patient was 44 or 57 yo  . skin lesion extraction  2013   basal cell - Dr Phillip Heal  . Uterine Polyps Removed  2004   Family History  Problem Relation Age of Onset  . Hyperlipidemia Mother   . Thyroid disease Mother   . Heart disease Mother        Heart Valve problems  . Coronary artery disease Father        Stents placed  . Hypertension Unknown        uncle  . Parkinson's disease Paternal Uncle   . Parkinson's disease Maternal Grandmother   . Heart disease Maternal Grandfather   . Heart disease Paternal Grandfather        MI  . Breast cancer Neg Hx    Social History   Socioeconomic History  . Marital status: Divorced    Spouse name: Not on file  . Number of children: 2  . Years of education: Not on file  . Highest education level: Not on file  Occupational History    Employer: [employer]  . Financial resource strain:  Not on file  . Food insecurity:    Worry: Not on file    Inability: Not on file  . Transportation needs:    Medical: Not on file    Non-medical: Not on file  Tobacco Use  . Smoking status: Never Smoker  . Smokeless tobacco: Never Used  Substance and Sexual Activity  . Alcohol use: Yes    Alcohol/week: 0.0 standard drinks    Comment: Occasional  . Drug use: No  . Sexual activity: Not on file  Lifestyle  . Physical activity:    Days per week: Not on file    Minutes per session: Not on file  . Stress: Not on file  Relationships  . Social connections:    Talks on phone: Not on file    Gets together: Not on file    Attends religious service: Not on file    Active member of club or organization: Not on file    Attends meetings of clubs or organizations: Not on file    Relationship status: Not on file  Other Topics Concern  . Not on file  Social History Narrative  . Not on file    Outpatient Encounter Medications as of 08/31/2018  Medication Sig  . Calcium Carbonate-Vitamin D (CALCIUM-VITAMIN D) 500-200 MG-UNIT per tablet Take 1 tablet by mouth daily.  Marland Kitchen estradiol (ESTRACE VAGINAL) 0.1 MG/GM vaginal cream One application q hs for 5 nights and then 2 x/week  . fish oil-omega-3 fatty acids 1000 MG capsule Take by mouth daily.  . fluocinonide (LIDEX) 0.05 % external solution APPLY TO AFFECTED AREA(S) AT BEDTIME  . fluocinonide gel (LIDEX) 2.42 % Apply 1 application topically 2 (two) times daily. Apply to affected area twice a day as needed.  . Multiple Vitamin (MULTIVITAMIN) tablet Take 1 tablet by mouth daily.  Marland Kitchen telmisartan (MICARDIS) 20 MG tablet TAKE 1 TABLET (20 MG TOTAL) BY MOUTH DAILY.  Marland Kitchen tretinoin (RETIN-A) 0.05 % cream Apply topically at bedtime. Apply to affected area three times a day as needed for Acne   No facility-administered encounter medications on file as of 08/31/2018.     Review of Systems  Constitutional: Negative for appetite change and unexpected weight  change.  HENT: Negative for congestion and sinus pressure.   Respiratory: Negative for cough, chest tightness and shortness of breath.   Cardiovascular: Negative for chest pain, palpitations and leg swelling.  Gastrointestinal: Negative for abdominal pain, diarrhea, nausea and vomiting.  Genitourinary: Negative for difficulty urinating and dysuria.  Musculoskeletal: Negative for joint swelling and myalgias.  Skin: Negative for color change and rash.  Neurological: Negative for dizziness, light-headedness and headaches.  Psychiatric/Behavioral: Negative for agitation and dysphoric mood.       Objective:    Physical Exam  Constitutional: She appears well-developed and well-nourished. No distress.  HENT:  Nose: Nose normal.  Mouth/Throat: Oropharynx is clear and moist.  Neck: Neck supple. No thyromegaly present.  Cardiovascular: Normal rate and regular rhythm.  Pulmonary/Chest: Breath sounds normal. No respiratory distress. She has no wheezes.  Abdominal: Soft. Bowel sounds are normal. There is no tenderness.  Musculoskeletal: She exhibits no edema or tenderness.  Lymphadenopathy:    She has no cervical adenopathy.  Skin: No rash noted. No erythema.  Psychiatric: She has a normal mood and affect. Her behavior is normal.    BP 128/80 (BP Location: Left Arm, Patient Position: Sitting, Cuff Size: Normal)   Pulse 95   Temp 98.5 F (36.9 C) (Oral)   Resp 18   Wt 119 lb 3.2 oz (54.1 kg)   LMP 12/01/2008   SpO2 99%   BMI 20.46 kg/m  Wt Readings from Last 3 Encounters:  08/31/18 119 lb 3.2 oz (54.1 kg)  05/20/18 118 lb 12.8 oz (53.9 kg)  11/19/17 119 lb 12.8 oz (54.3 kg)     Lab Results  Component Value Date   WBC 6.7 01/11/2018   HGB 12.8 01/11/2018   HCT 38.4 01/11/2018   PLT 308.0 01/11/2018   GLUCOSE 84 06/25/2018   CHOL 187 06/25/2018   TRIG 104.0 06/25/2018   HDL 55.70 06/25/2018   LDLDIRECT 148.9 08/15/2013   LDLCALC 111 (H) 06/25/2018   ALT 17 06/25/2018    AST 17 06/25/2018   NA 140 06/25/2018   K 3.8 06/25/2018   CL 102 06/25/2018   CREATININE 0.77 06/25/2018   BUN 17 06/25/2018   CO2 30 06/25/2018   TSH 2.06 12/17/2017    US Abdomen Complete  Result Date: 08/10/2018 CLINICAL DATA:  Hyperbilirubinemia. EXAM: ABDOMEN ULTRASOUND COMPLETE COMPARISON:  Ultrasound 03/09/2014. FINDINGS: Gallbladder: No gallstones noted on today's exam. No  wall thickening visualized. No sonographic Murphy sign noted by sonographer. Common bile duct: Diameter: 1 mm Liver: No focal lesion identified. Within normal limits in parenchymal echogenicity. Portal vein is patent on color Doppler imaging with normal direction of blood flow towards the liver. IVC: No abnormality visualized. Pancreas: Visualized portion unremarkable. Spleen: Size and appearance within normal limits. Right Kidney: Length: 12.0 cm. Echogenicity within normal limits. No mass or hydronephrosis visualized. Left Kidney: Length: 10.6 cm. Echogenicity within normal limits. No mass or hydronephrosis visualized. Abdominal aorta: No aneurysm visualized. Other findings: None. IMPRESSION: No gallstones noted on today's exam. No evidence of biliary distention. No acute abnormality. If symptoms persist MRI/MRCP can be obtained. Electronically Signed   By: Marcello Moores  Register   On: 08/10/2018 10:27       Assessment & Plan:   Problem List Items Addressed This Visit    Essential hypertension, benign    Blood pressure under good control.  Continue same medication regimen.  Follow pressures.  Follow metabolic panel.        Relevant Orders   Basic metabolic panel   Hypercholesterolemia    Low cholesterol diet and exercise.  Follow lipid panel.        Relevant Orders   Hepatic function panel   Lipid panel   Left shoulder pain    Shoulder pain doing better.  S/p physical therapy.  Doing better.  Follow.            Einar Pheasant, MD

## 2018-09-01 ENCOUNTER — Ambulatory Visit: Payer: 59

## 2018-09-01 DIAGNOSIS — M62838 Other muscle spasm: Secondary | ICD-10-CM | POA: Diagnosis not present

## 2018-09-01 DIAGNOSIS — M25511 Pain in right shoulder: Secondary | ICD-10-CM | POA: Diagnosis not present

## 2018-09-01 DIAGNOSIS — M79651 Pain in right thigh: Secondary | ICD-10-CM | POA: Diagnosis not present

## 2018-09-01 DIAGNOSIS — M6281 Muscle weakness (generalized): Secondary | ICD-10-CM

## 2018-09-01 DIAGNOSIS — M25512 Pain in left shoulder: Secondary | ICD-10-CM

## 2018-09-01 DIAGNOSIS — M79652 Pain in left thigh: Secondary | ICD-10-CM | POA: Diagnosis not present

## 2018-09-01 DIAGNOSIS — G8929 Other chronic pain: Secondary | ICD-10-CM | POA: Diagnosis not present

## 2018-09-02 NOTE — Therapy (Signed)
[place] [facility] [address], [place], [address] Phone: [phone]   Fax:  [phone]  Physical Therapy Treatment  Patient Details  Name: Sara Perry MRN: [id number] Date of Birth: 16-Dec-1960 Referring Provider (PT): Einar Pheasant MD   Encounter Date: 09/01/2018  PT End of Session - 09/02/18 1233    Visit Number  9    Number of Visits  13    Date for PT Re-Evaluation  09/13/18    PT Start Time  2595    PT Stop Time  1800    PT Time Calculation (min)  45 min    Activity Tolerance  Patient tolerated treatment well    Behavior During Therapy  [facility] for tasks assessed/performed       Past Medical History:  Diagnosis Date  . Eczema   . Frequent UTI   . H/O lymphadenopathy    With previous submandibular node removals.  . Hypertension   . Recurrent sinus infections     Past Surgical History:  Procedure Laterality Date  . COLONOSCOPY WITH PROPOFOL N/A 08/25/2017   Procedure: COLONOSCOPY WITH PROPOFOL;  Surgeon: Lucilla Lame, MD;  Location: [facility] ENDOSCOPY;  Service: Endoscopy;  Laterality: N/A;  . Lymph Node Removal  1984  . NECK SURGERY  1977   H/O Right neck surgery secondary to a benign growth with when patient was 76 or 57 yo  . skin lesion extraction  2013   basal cell - Dr Phillip Heal  . Uterine Polyps Removed  2004    There were no vitals filed for this visit.  Subjective Assessment - 09/02/18 1229    Subjective  Patient states improvement overall with ability to perform greater amount of exercise and work with less overal pain. Patient reports she continues to have pain along her bicep tendons B.     Pertinent History  Chronic L and R shoulder pain, R LE (quad pain)    Limitations  Lifting    Patient Stated Goals  To improve ability to perform overhead activity; less pain with performing ADLs    Currently in Pain?  Yes    Pain Score  2     Pain Location  Shoulder    Pain Orientation   Right;Left    Pain Descriptors / Indicators  Aching    Pain Onset  More than a month ago    Pain Frequency  Intermittent         TREATMENT Therapeutic Exercise Biceps shoulder flexion with 3# weights of resistance - x 20  Shoulder elevation against therapist resistance - 2 x 10 with 5 sec holds Standing radial nerve glide - x 20  Standing UT stretch in the doorway to improve mobility and decrease pain - 10 x 20sec holds  Manual therapy STM to UT to decrease increased spasms and pain with patient positioned in standing and prone utilizing superficial techniques  Modalities Dry Needling: (2) .3 x 38mm needles placed along the R UT with patient positioned in supine. Performed to R upper trap with patient positioned in prone. Patient verbally consents to treatment, educated on risks and benefits of treatment.  Patient demonstrates decreased pain at the end of the session    PT Education - 09/02/18 1232    Education Details  form/technique with exercise; Supinated shoulder flexion added to HEP    Person(s) Educated  Patient    Methods  Explanation;Demonstration    Comprehension  Verbalized understanding;Returned demonstration  PT Long Term Goals - 08/03/18 2218      PT LONG TERM GOAL #1   Title  Patient will improve shoulder ER and IR to full AROM without increase in pain to better be able to turn her car without increase in pain    Baseline  Increased pain with performance; 08/02/2018: L shoulder: full AROM without increase in pain, R shoulder increased pain at end range for both directions    Time  6    Period  Weeks    Status  On-going      PT LONG TERM GOAL #2   Title  Patient will have a worst pain of 1/10 in the shoulder to be able to perform ADLs without increase in shoulder pain.    Baseline  3/10 worst pain; L shoulder: 1/10 worst pain; R shoulder 8/10 worst pain    Time  6    Period  Weeks    Status  On-going      PT LONG TERM GOAL #3   Title  Patient  will be independent with HEP to continue benefits of therapy until after discharge.    Baseline  Dependent with form and progression; 08/03/2018: independent for L shoulder exercises, Heavy to moderate cueing to perform R shoulder exercises    Time  6    Period  Weeks    Status  On-going            Plan - 09/02/18 1233    Clinical Impression Statement  Patient demonstrates decreased pain along UT after performing UT stretch in doorway and performing resistance based exercises for UT exercises. Patient continues to demosntrate difficulty with performing shoulder flexion with supination. Patient will benefit from further skilled therapy to return to prior level of function.     Rehab Potential  Good    Clinical Impairments Affecting Rehab Potential  (+) highly active (-) Chronicity    PT Frequency  2x / week    PT Duration  6 weeks    PT Treatment/Interventions  Manual techniques;Neuromuscular re-education;Patient/family education;Therapeutic exercise;Therapeutic activities;Iontophoresis 4mg /ml Dexamethasone;Moist Heat;Cryotherapy;Electrical Stimulation;Ultrasound;Passive range of motion;Dry needling;Joint Manipulations    PT Next Visit Plan  address shoulder IR/ER    PT Home Exercise Plan  See education section    Consulted and Agree with Plan of Care  Patient       Patient will benefit from skilled therapeutic intervention in order to improve the following deficits and impairments:  Decreased range of motion, Decreased endurance, Decreased strength, Decreased balance, Pain, Decreased coordination, Decreased mobility, Decreased activity tolerance, Postural dysfunction, Impaired sensation, Increased fascial restricitons  Visit Diagnosis: Muscle weakness (generalized)  Other muscle spasm  Chronic right shoulder pain  Chronic left shoulder pain     Problem List Patient Active Problem List   Diagnosis Date Noted  . Left shoulder pain 05/23/2018  . Snoring 05/23/2018  . Benign  neoplasm of descending colon   . Benign neoplasm of ascending colon   . Vaginal atrophy 03/22/2017  . Meniere's disease 08/17/2016  . Colon cancer screening 08/11/2015  . Fatigue 06/03/2015  . Health care maintenance 06/03/2015  . Neck pain 02/05/2014  . Hypercholesterolemia 02/05/2014  . Back pain 12/05/2012  . Trigger finger 12/05/2012  . Essential hypertension, benign 12/05/2012    Blythe Stanford, PT DPT 09/02/2018, 12:35 PM  [place] [facility] [address], [place], [address] Phone: [phone]   Fax:  [phone]  Name: Sara Perry MRN:  [id number] Date of Birth: 1960-11-08

## 2018-09-05 ENCOUNTER — Encounter: Payer: Self-pay | Admitting: Internal Medicine

## 2018-09-05 NOTE — Assessment & Plan Note (Signed)
Low cholesterol diet and exercise.  Follow lipid panel.   

## 2018-09-05 NOTE — Assessment & Plan Note (Signed)
Blood pressure under good control.  Continue same medication regimen.  Follow pressures.  Follow metabolic panel.   

## 2018-09-05 NOTE — Assessment & Plan Note (Signed)
Shoulder pain doing better.  S/p physical therapy.  Doing better.  Follow.

## 2018-09-07 ENCOUNTER — Ambulatory Visit: Payer: 59

## 2018-09-07 DIAGNOSIS — M79651 Pain in right thigh: Secondary | ICD-10-CM | POA: Diagnosis not present

## 2018-09-07 DIAGNOSIS — M25512 Pain in left shoulder: Secondary | ICD-10-CM | POA: Diagnosis not present

## 2018-09-07 DIAGNOSIS — M79652 Pain in left thigh: Secondary | ICD-10-CM | POA: Diagnosis not present

## 2018-09-07 DIAGNOSIS — G8929 Other chronic pain: Secondary | ICD-10-CM | POA: Diagnosis not present

## 2018-09-07 DIAGNOSIS — M25511 Pain in right shoulder: Secondary | ICD-10-CM

## 2018-09-07 DIAGNOSIS — M6281 Muscle weakness (generalized): Secondary | ICD-10-CM | POA: Diagnosis not present

## 2018-09-07 DIAGNOSIS — M62838 Other muscle spasm: Secondary | ICD-10-CM

## 2018-09-07 NOTE — Therapy (Signed)
[place] [facility] [address], [place], [address] Phone: [phone]   Fax:  [phone]  Physical Therapy Treatment  Patient Details  Name: Sara Perry MRN: [id number] Date of Birth: [birth year] Referring Provider (PT): Einar Pheasant MD   Encounter Date: 09/07/2018  PT End of Session - 09/07/18 1722    Visit Number  10    Number of Visits  13    Date for PT Re-Evaluation  09/13/18    PT Start Time  1600    PT Stop Time  1650    PT Time Calculation (min)  50 min    Activity Tolerance  Patient tolerated treatment well    Behavior During Therapy  [facility] for tasks assessed/performed       Past Medical History:  Diagnosis Date  . Eczema   . Frequent UTI   . H/O lymphadenopathy    With previous submandibular node removals.  . Hypertension   . Recurrent sinus infections     Past Surgical History:  Procedure Laterality Date  . COLONOSCOPY WITH PROPOFOL N/A 08/25/2017   Procedure: COLONOSCOPY WITH PROPOFOL;  Surgeon: Lucilla Lame, MD;  Location: [facility] ENDOSCOPY;  Service: Endoscopy;  Laterality: N/A;  . Lymph Node Removal  1984  . NECK SURGERY  1977   H/O Right neck surgery secondary to a benign growth with when patient was 83 or 57 yo  . skin lesion extraction  2013   basal cell - Dr Phillip Heal  . Uterine Polyps Removed  2004    There were no vitals filed for this visit.  Subjective Assessment - 09/07/18 1721    Subjective  Pateint states she feels her shoulders are improving overall but states increased pain with performing shoulder flexion and pushing outside of BOS.     Pertinent History  Chronic L and R shoulder pain, R LE (quad pain)    Limitations  Lifting    Patient Stated Goals  To improve ability to perform overhead activity; less pain with performing ADLs    Currently in Pain?  Yes    Pain Score  1     Pain Location  Shoulder    Pain Orientation  Right;Left    Pain Descriptors /  Indicators  Aching    Pain Type  Chronic pain    Pain Onset  More than a month ago    Pain Frequency  Intermittent        TREATMENT Therapeutic Exercise Biceps shoulder flexion with 3# weights of resistance - x 20  Shoulder elevation against therapist resistance - 2 x 10 with 5 sec holds Standing radial nerve glide - x 20  Standing shoulder circles - 2 x 20 with 4# weight  Standing scapular retraction with cervical flexion - x 10 Standing scapular retraction with 20# with scapular retraction - x 20    Manual therapy STM to UT to decrease increased spasms and pain with patient positioned in standing and prone utilizing superficial techniques   Modalities Dry Needling: (2) .3 x 94mm needles placed along the R UT with patient positioned in supine. Performed to R upper trap with patient positioned in prone. Patient verbally consents to treatment, educated on risks and benefits of treatment.   Patient demonstrates decreased pain at the end of the session   PT Education - 09/07/18 1722    Education Details  form/technique with exercise    Person(s) Educated  Patient    Methods  Explanation;Demonstration  Comprehension  Verbalized understanding;Returned demonstration          PT Long Term Goals - 08/03/18 2218      PT LONG TERM GOAL #1   Title  Patient will improve shoulder ER and IR to full AROM without increase in pain to better be able to turn her car without increase in pain    Baseline  Increased pain with performance; 08/02/2018: L shoulder: full AROM without increase in pain, R shoulder increased pain at end range for both directions    Time  6    Period  Weeks    Status  On-going      PT LONG TERM GOAL #2   Title  Patient will have a worst pain of 1/10 in the shoulder to be able to perform ADLs without increase in shoulder pain.    Baseline  3/10 worst pain; L shoulder: 1/10 worst pain; R shoulder 8/10 worst pain    Time  6    Period  Weeks    Status  On-going       PT LONG TERM GOAL #3   Title  Patient will be independent with HEP to continue benefits of therapy until after discharge.    Baseline  Dependent with form and progression; 08/03/2018: independent for L shoulder exercises, Heavy to moderate cueing to perform R shoulder exercises    Time  6    Period  Weeks    Status  On-going            Plan - 09/07/18 1723    Clinical Impression Statement  Improvement in pain after dry needling/manual therapy and resistance based exercises. Improvement in pain sustained for longer periods of time and focused on educating patient on reasons for greater functional carryover with exercise versus manual therapy. Patient will benefit from further skilled therapy to return to prior level of function.     Rehab Potential  Good    Clinical Impairments Affecting Rehab Potential  (+) highly active (-) Chronicity    PT Frequency  2x / week    PT Duration  6 weeks    PT Treatment/Interventions  Manual techniques;Neuromuscular re-education;Patient/family education;Therapeutic exercise;Therapeutic activities;Iontophoresis 4mg /ml Dexamethasone;Moist Heat;Cryotherapy;Electrical Stimulation;Ultrasound;Passive range of motion;Dry needling;Joint Manipulations    PT Next Visit Plan  address shoulder IR/ER    PT Home Exercise Plan  See education section    Consulted and Agree with Plan of Care  Patient       Patient will benefit from skilled therapeutic intervention in order to improve the following deficits and impairments:  Decreased range of motion, Decreased endurance, Decreased strength, Decreased balance, Pain, Decreased coordination, Decreased mobility, Decreased activity tolerance, Postural dysfunction, Impaired sensation, Increased fascial restricitons  Visit Diagnosis: Muscle weakness (generalized)  Other muscle spasm  Chronic right shoulder pain     Problem List Patient Active Problem List   Diagnosis Date Noted  . Left shoulder pain 05/23/2018  .  Snoring 05/23/2018  . Benign neoplasm of descending colon   . Benign neoplasm of ascending colon   . Vaginal atrophy 03/22/2017  . Meniere's disease 08/17/2016  . Colon cancer screening 08/11/2015  . Fatigue 06/03/2015  . Health care maintenance 06/03/2015  . Neck pain 02/05/2014  . Hypercholesterolemia 02/05/2014  . Back pain 12/05/2012  . Trigger finger 12/05/2012  . Essential hypertension, benign 12/05/2012    Blythe Stanford, PT DPT 09/07/2018, 5:25 PM  [place] [facility] [address], [place],  [place] Phone: [phone]   Fax:  [phone]  Name: Sara Perry MRN: [id number] Date of Birth: 05-Jun-1961

## 2018-09-08 DIAGNOSIS — D485 Neoplasm of uncertain behavior of skin: Secondary | ICD-10-CM | POA: Diagnosis not present

## 2018-09-08 DIAGNOSIS — D225 Melanocytic nevi of trunk: Secondary | ICD-10-CM | POA: Diagnosis not present

## 2018-09-10 ENCOUNTER — Other Ambulatory Visit: Payer: Self-pay | Admitting: Internal Medicine

## 2018-09-10 DIAGNOSIS — G4733 Obstructive sleep apnea (adult) (pediatric): Secondary | ICD-10-CM | POA: Diagnosis not present

## 2018-09-13 ENCOUNTER — Ambulatory Visit: Payer: 59

## 2018-09-14 ENCOUNTER — Encounter: Payer: Self-pay | Admitting: Internal Medicine

## 2018-09-14 ENCOUNTER — Ambulatory Visit (INDEPENDENT_AMBULATORY_CARE_PROVIDER_SITE_OTHER): Payer: 59 | Admitting: Internal Medicine

## 2018-09-14 VITALS — BP 126/70 | HR 91 | Ht 62.0 in | Wt 118.0 lb

## 2018-09-14 DIAGNOSIS — G4733 Obstructive sleep apnea (adult) (pediatric): Secondary | ICD-10-CM | POA: Diagnosis not present

## 2018-09-14 NOTE — Progress Notes (Signed)
[facility]     Assessment and Plan:  Obstructive sleep apnea. - Continue using every every night.    Essential hypertension. - Sleep apnea can contribute to essential hypertension, therefore treatment of sleep apnea is important part of hypertension management.   Date: 09/14/2018  MRN# [id number] Sara Perry 24-Apr-1961    Sara Perry is a 57 y.o. old female seen in consultation for chief complaint of:    Chief Complaint  Patient presents with  . Sleep Apnea    pt here for f/u on cpap therapy for 1 mos. She is using nightly.  . Sleep Apnea    pt set up on therapy 09/11/18. She is 30/30 days compliant.    HPI:   Patient is a 57 year old female, with symptoms and signs of obstructive sleep apnea.  She was sent for home sleep test which showed mild OSA, she was started on CPAP. She appears to be doing well with CPAP, she is using it every night, she is no longer snoring and is more awake during the day.   Denies jaw pain, denies TMJ, no dentures.   **Download data 08/15/2018-09/13/2018>> raw data personally reviewed, usage greater than 4 hours is 30/30 days.  Average usage on days used is 6 hours 40 minutes.  Pressure ranges 5-15.  Median pressure 8, 93 percentile pressure 11, maximum pressure 12.4.  Leaks are within normal limits.  Residual AHI is 1.6.  Overall this shows excellent compliance with CPAP with excellent control of obstructive sleep apnea. **HST 07/02/2018>> mild OSA with AHI of 6.  Recommended auto CPAP pressure range 5-15.  Medication:    Current Outpatient Medications:  .  Calcium Carbonate-Vitamin D (CALCIUM-VITAMIN D) 500-200 MG-UNIT per tablet, Take 1 tablet by mouth daily., Disp: , Rfl:  .  estradiol (ESTRACE VAGINAL) 0.1 MG/GM vaginal cream, One application q hs for 5 nights and then 2 x/week, Disp: 42.5 g, Rfl: 1 .  fish oil-omega-3 fatty acids 1000 MG capsule, Take by mouth daily., Disp: , Rfl:  .  fluocinonide (LIDEX)  0.05 % external solution, APPLY TO AFFECTED AREA(S) AT BEDTIME, Disp: 60 mL, Rfl: 0 .  fluocinonide gel (LIDEX) 3.71 %, Apply 1 application topically 2 (two) times daily. Apply to affected area twice a day as needed., Disp: 60 g, Rfl: 0 .  Multiple Vitamin (MULTIVITAMIN) tablet, Take 1 tablet by mouth daily., Disp: , Rfl:  .  telmisartan (MICARDIS) 20 MG tablet, TAKE 1 TABLET BY MOUTH DAILY, Disp: 30 tablet, Rfl: 2 .  tretinoin (RETIN-A) 0.05 % cream, Apply topically at bedtime. Apply to affected area three times a day as needed for Acne, Disp: , Rfl:    Allergies:  Patient has no known allergies.  Review of Systems:  Constitutional: Feels well. Cardiovascular: Denies chest pain, exertional chest pain.  Pulmonary: Denies hemoptysis, pleuritic chest pain.   The remainder of systems were reviewed and were found to be negative other than what is documented in the HPI.    Physical Examination:   VS: BP 126/70 (BP Location: Left Arm, Cuff Size: Normal)   Pulse 91   Ht 5\' 2"  (1.575 m)   Wt 118 lb (53.5 kg)   LMP 12/01/2008   SpO2 93%   BMI 21.58 kg/m   General Appearance: No distress  Neuro:without focal findings, mental status, speech normal, alert and oriented HEENT: PERRLA, EOM intact Pulmonary: No wheezing, No rales  CardiovascularNormal S1,S2.  No m/r/g.  Abdomen: Benign, Soft, non-tender, No masses  Renal:  No costovertebral tenderness  GU:  No performed at this time. Endoc: No evident thyromegaly, no signs of acromegaly or Cushing features Skin:   warm, no rashes, no ecchymosis  Extremities: normal, no cyanosis, clubbing.      LABORATORY PANEL:   CBC No results for input(s): WBC, HGB, HCT, PLT in the last 168 hours. ------------------------------------------------------------------------------------------------------------------  Chemistries  No results for input(s): NA, K, CL, CO2, GLUCOSE, BUN, CREATININE, CALCIUM, MG, AST, ALT, ALKPHOS, BILITOT in the last 168  hours.  Invalid input(s): GFRCGP ------------------------------------------------------------------------------------------------------------------  Cardiac Enzymes No results for input(s): TROPONINI in the last 168 hours. ------------------------------------------------------------  RADIOLOGY:  No results found.     Thank  you for the consultation and for allowing [facility] to assist in the care of your patient. Our recommendations are noted above.  Please contact us if we can be of further service.   Marda Stalker, M.D., F.C.C.P.  Board Certified in Internal Medicine, Pulmonary Medicine, [place], and Sleep Medicine.  [facility] Office Number: [phone]   09/14/2018

## 2018-09-14 NOTE — Patient Instructions (Signed)
Continue using CPAP every night.  

## 2018-09-15 ENCOUNTER — Ambulatory Visit: Payer: 59 | Attending: Internal Medicine

## 2018-09-15 DIAGNOSIS — M25511 Pain in right shoulder: Secondary | ICD-10-CM | POA: Diagnosis not present

## 2018-09-15 DIAGNOSIS — M6281 Muscle weakness (generalized): Secondary | ICD-10-CM | POA: Diagnosis not present

## 2018-09-15 DIAGNOSIS — G8929 Other chronic pain: Secondary | ICD-10-CM | POA: Diagnosis not present

## 2018-09-15 DIAGNOSIS — M25512 Pain in left shoulder: Secondary | ICD-10-CM | POA: Diagnosis not present

## 2018-09-15 DIAGNOSIS — M62838 Other muscle spasm: Secondary | ICD-10-CM | POA: Diagnosis not present

## 2018-09-15 NOTE — Therapy (Signed)
[facility] [address] [place], [address] Phone: [phone]   Fax:  [phone]  Physical Therapy Treatment/Progress Note  Patient Details  Name: Sara Perry MRN: [id number] Date of Birth: [birth year] Referring Provider (PT): Einar Pheasant MD   Encounter Date: 09/15/2018  PT End of Session - 09/15/18 1731    Visit Number  11    Number of Visits  21    Date for PT Re-Evaluation  10/13/18    PT Start Time  9518    PT Stop Time  1800    PT Time Calculation (min)  45 min    Activity Tolerance  Patient tolerated treatment well    Behavior During Therapy  [facility] for tasks assessed/performed       Past Medical History:  Diagnosis Date  . Eczema   . Frequent UTI   . H/O lymphadenopathy    With previous submandibular node removals.  . Hypertension   . Recurrent sinus infections     Past Surgical History:  Procedure Laterality Date  . COLONOSCOPY WITH PROPOFOL N/A 08/25/2017   Procedure: COLONOSCOPY WITH PROPOFOL;  Surgeon: Lucilla Lame, MD;  Location: [facility] ENDOSCOPY;  Service: Endoscopy;  Laterality: N/A;  . Lymph Node Removal  1984  . NECK SURGERY  1977   H/O Right neck surgery secondary to a benign growth with when patient was 18 or 57 yo  . skin lesion extraction  2013   basal cell - Dr Phillip Heal  . Uterine Polyps Removed  2004    There were no vitals filed for this visit.  Subjective Assessment - 09/15/18 1729    Subjective  Patient states she has been performing her exercises and her shoulders have been feeling better.     Pertinent History  Chronic L and R shoulder pain, R LE (quad pain)    Limitations  Lifting    Patient Stated Goals  To improve ability to perform overhead activity; less pain with performing ADLs    Currently in Pain?  No/denies    Pain Onset  More than a month ago       TREATMENT Therapeutic Exercise Biceps shoulder flexion with 3# weights of resistance - x 20   Baby cobra in prone - x 5  Weight passes behind back - 3# 1 min performed B Standing shoulder IR - 2 x 20  Standing shoulder at 90 degrees abducted - 2 x 20  Scaption shoulder flexion with 3# -- x 20    Patient demonstrates decreased pain at the end of the session   PT Education - 09/15/18 1730    Education Details  form/technique with exercise    Person(s) Educated  Patient    Methods  Explanation;Demonstration    Comprehension  Verbalized understanding;Returned demonstration          PT Long Term Goals - 09/15/18 1735      PT LONG TERM GOAL #1   Title  Patient will improve shoulder ER and IR to full AROM without increase in pain to better be able to turn her car without increase in pain    Baseline  Increased pain with performance; 08/02/2018: L shoulder: full AROM without increase in pain, R shoulder increased pain at end range for both directions 09/15/2018: full AROM B increased pain with end range IR    Time  6    Period  Weeks    Status  On-going      PT  LONG TERM GOAL #2   Title  Patient will have a worst pain of 1/10 in the shoulder to be able to perform ADLs without increase in shoulder pain.    Baseline  3/10 worst pain; L shoulder: 1/10 worst pain; R shoulder 8/10 worst pain; 09/15/2018: 4/10    Time  6    Period  Weeks    Status  On-going      PT LONG TERM GOAL #3   Title  Patient will be independent with HEP to continue benefits of therapy until after discharge.    Baseline  Dependent with form and progression; 08/03/2018: independent for L shoulder exercises, Heavy to moderate cueing to perform R shoulder exercises; 09/15/2018: Independent     Time  6    Period  Weeks    Status  Achieved            Plan - 09/15/18 1746    Clinical Impression Statement  Patient is making progress toward long term goals with overall decrease in pain and improvement in AROM B most notablely with IR/ER. Patient continues to demosntrate increased pain at end range IR  limiting ability to comfortably donn and doff a bra. Patient demonstrates poor muscular endurance with exercise and will benefit from further skilled therapy to return to prior level of function.     Rehab Potential  Good    Clinical Impairments Affecting Rehab Potential  (+) highly active (-) Chronicity    PT Frequency  2x / week    PT Duration  6 weeks    PT Treatment/Interventions  Manual techniques;Neuromuscular re-education;Patient/family education;Therapeutic exercise;Therapeutic activities;Iontophoresis 4mg /ml Dexamethasone;Moist Heat;Cryotherapy;Electrical Stimulation;Ultrasound;Passive range of motion;Dry needling;Joint Manipulations    PT Next Visit Plan  address shoulder IR/ER    PT Home Exercise Plan  See education section    Consulted and Agree with Plan of Care  Patient       Patient will benefit from skilled therapeutic intervention in order to improve the following deficits and impairments:  Decreased range of motion, Decreased endurance, Decreased strength, Decreased balance, Pain, Decreased coordination, Decreased mobility, Decreased activity tolerance, Postural dysfunction, Impaired sensation, Increased fascial restricitons  Visit Diagnosis: Muscle weakness (generalized)  Other muscle spasm  Chronic right shoulder pain  Chronic left shoulder pain     Problem List Patient Active Problem List   Diagnosis Date Noted  . Left shoulder pain 05/23/2018  . Snoring 05/23/2018  . Benign neoplasm of descending colon   . Benign neoplasm of ascending colon   . Vaginal atrophy 03/22/2017  . Meniere's disease 08/17/2016  . Colon cancer screening 08/11/2015  . Fatigue 06/03/2015  . Health care maintenance 06/03/2015  . Neck pain 02/05/2014  . Hypercholesterolemia 02/05/2014  . Back pain 12/05/2012  . Trigger finger 12/05/2012  . Essential hypertension, benign 12/05/2012    Blythe Stanford, PT DPT 09/15/2018, 5:59 PM  [facility] [address], [place], [address] Phone: [phone]   Fax:  [phone]  Name: Sara Perry MRN: [id number] Date of Birth: [birth year]

## 2018-09-20 ENCOUNTER — Ambulatory Visit: Payer: 59

## 2018-09-20 DIAGNOSIS — G8929 Other chronic pain: Secondary | ICD-10-CM

## 2018-09-20 DIAGNOSIS — M25511 Pain in right shoulder: Secondary | ICD-10-CM

## 2018-09-20 DIAGNOSIS — M6281 Muscle weakness (generalized): Secondary | ICD-10-CM

## 2018-09-20 DIAGNOSIS — M62838 Other muscle spasm: Secondary | ICD-10-CM

## 2018-09-20 DIAGNOSIS — M25512 Pain in left shoulder: Secondary | ICD-10-CM

## 2018-09-20 NOTE — Therapy (Signed)
[facility] [address] [place], [address] Phone: [phone]   Fax:  [phone]  Physical Therapy Treatment  Patient Details  Name: Sara Perry MRN: [id number] Date of Birth: [birth year] Referring Provider (PT): Einar Pheasant MD   Encounter Date: 09/20/2018  PT End of Session - 09/20/18 1706    Visit Number  12    Number of Visits  21    Date for PT Re-Evaluation  10/13/18    PT Start Time  2703    PT Stop Time  1735    PT Time Calculation (min)  40 min    Activity Tolerance  Patient tolerated treatment well    Behavior During Therapy  [facility] for tasks assessed/performed       Past Medical History:  Diagnosis Date  . Eczema   . Frequent UTI   . H/O lymphadenopathy    With previous submandibular node removals.  . Hypertension   . Recurrent sinus infections     Past Surgical History:  Procedure Laterality Date  . COLONOSCOPY WITH PROPOFOL N/A 08/25/2017   Procedure: COLONOSCOPY WITH PROPOFOL;  Surgeon: Lucilla Lame, MD;  Location: [facility] ENDOSCOPY;  Service: Endoscopy;  Laterality: N/A;  . Lymph Node Removal  1984  . NECK SURGERY  1977   H/O Right neck surgery secondary to a benign growth with when patient was 31 or 56 yo  . skin lesion extraction  2013   basal cell - Dr Phillip Heal  . Uterine Polyps Removed  2004    There were no vitals filed for this visit.  Subjective Assessment - 09/20/18 1700    Subjective  Patient reports her shoulder has been feeling better overall. Patient states it's "still not perfect" but improved.     Pertinent History  Chronic L and R shoulder pain, R LE (quad pain)    Limitations  Lifting    Patient Stated Goals  To improve ability to perform overhead activity; less pain with performing ADLs    Currently in Pain?  No/denies    Pain Onset  More than a month ago         TREATMENT Therapeutic Exercise Biceps shoulder flexion with 3# weights of resistance  - x 20  Shoulder flexion in standing - x 20 3#  Scapular retraction with black theraband - x 20  Scapular straight arm retractions -  x 20  Baby cobra in prone - x 20 Push up against raised table - x 20  Total gym pull ups in supine - x 20 level 13 Total gym bicep curls in sitting - x 20 level 13 High row in sitting - x 20 20# Straight arm push downs in sitting - x 20  Shoulder ER with RTB around wrists - x 20 Shoulder ER with shoulder flexion with back against wall with YTB- x20 Shoulder ER with shoulder flexion facing wall with YTB - x 20    Patient demonstrates decreased pain at the end of the session     PT Education - 09/20/18 1706    Education Details  form/technique with exercise    Person(s) Educated  Patient    Methods  Explanation;Demonstration    Comprehension  Verbalized understanding;Returned demonstration          PT Long Term Goals - 09/15/18 1735      PT LONG TERM GOAL #1   Title  Patient will improve shoulder ER and IR to full AROM without increase  in pain to better be able to turn her car without increase in pain    Baseline  Increased pain with performance; 08/02/2018: L shoulder: full AROM without increase in pain, R shoulder increased pain at end range for both directions 09/15/2018: full AROM B increased pain with end range IR    Time  6    Period  Weeks    Status  On-going      PT LONG TERM GOAL #2   Title  Patient will have a worst pain of 1/10 in the shoulder to be able to perform ADLs without increase in shoulder pain.    Baseline  3/10 worst pain; L shoulder: 1/10 worst pain; R shoulder 8/10 worst pain; 09/15/2018: 4/10    Time  6    Period  Weeks    Status  On-going      PT LONG TERM GOAL #3   Title  Patient will be independent with HEP to continue benefits of therapy until after discharge.    Baseline  Dependent with form and progression; 08/03/2018: independent for L shoulder exercises, Heavy to moderate cueing to perform R shoulder  exercises; 09/15/2018: Independent     Time  6    Period  Weeks    Status  Achieved            Plan - 09/20/18 1720    Clinical Impression Statement  Patient demonstrates ability to perform higher level exercises today wihtout increase in pain indicating functional carryover between sessions. Patient continues to demonstrates increased weakness and pain with prolonged shoulder flexion and abduction. Patient will benefit from further skilled therapy to return to prior level of function.     Rehab Potential  Good    Clinical Impairments Affecting Rehab Potential  (+) highly active (-) Chronicity    PT Frequency  2x / week    PT Duration  6 weeks    PT Treatment/Interventions  Manual techniques;Neuromuscular re-education;Patient/family education;Therapeutic exercise;Therapeutic activities;Iontophoresis 4mg /ml Dexamethasone;Moist Heat;Cryotherapy;Electrical Stimulation;Ultrasound;Passive range of motion;Dry needling;Joint Manipulations    PT Next Visit Plan  address shoulder IR/ER    PT Home Exercise Plan  See education section    Consulted and Agree with Plan of Care  Patient       Patient will benefit from skilled therapeutic intervention in order to improve the following deficits and impairments:  Decreased range of motion, Decreased endurance, Decreased strength, Decreased balance, Pain, Decreased coordination, Decreased mobility, Decreased activity tolerance, Postural dysfunction, Impaired sensation, Increased fascial restricitons  Visit Diagnosis: Muscle weakness (generalized)  Other muscle spasm  Chronic right shoulder pain  Chronic left shoulder pain     Problem List Patient Active Problem List   Diagnosis Date Noted  . Left shoulder pain 05/23/2018  . Snoring 05/23/2018  . Benign neoplasm of descending colon   . Benign neoplasm of ascending colon   . Vaginal atrophy 03/22/2017  . Meniere's disease 08/17/2016  . Colon cancer screening 08/11/2015  . Fatigue  06/03/2015  . Health care maintenance 06/03/2015  . Neck pain 02/05/2014  . Hypercholesterolemia 02/05/2014  . Back pain 12/05/2012  . Trigger finger 12/05/2012  . Essential hypertension, benign 12/05/2012    Blythe Stanford, PT DPT 09/20/2018, 5:42 PM  [facility] [address] [place], [address] Phone: [phone]   Fax:  [phone]  Name: Sara Perry MRN: [id number] Date of Birth: 04-25-1961

## 2018-09-22 ENCOUNTER — Ambulatory Visit: Payer: 59

## 2018-09-23 ENCOUNTER — Ambulatory Visit: Payer: 59

## 2018-09-23 DIAGNOSIS — M62838 Other muscle spasm: Secondary | ICD-10-CM | POA: Diagnosis not present

## 2018-09-23 DIAGNOSIS — M6281 Muscle weakness (generalized): Secondary | ICD-10-CM | POA: Diagnosis not present

## 2018-09-23 DIAGNOSIS — M25511 Pain in right shoulder: Secondary | ICD-10-CM | POA: Diagnosis not present

## 2018-09-23 DIAGNOSIS — G8929 Other chronic pain: Secondary | ICD-10-CM | POA: Diagnosis not present

## 2018-09-23 DIAGNOSIS — M25512 Pain in left shoulder: Secondary | ICD-10-CM | POA: Diagnosis not present

## 2018-09-23 NOTE — Therapy (Signed)
[facility] [address], [place], [address] Phone: [phone]   Fax:  [phone]  Physical Therapy Treatment  Patient Details  Name: Sara Perry MRN: [id number] Date of Birth: 01-20-1961 Referring Provider (PT): Einar Pheasant MD   Encounter Date: 09/23/2018  PT End of Session - 09/23/18 1801    Visit Number  13    Number of Visits  21    Date for PT Re-Evaluation  10/13/18    PT Start Time  7673    PT Stop Time  1800    PT Time Calculation (min)  45 min    Activity Tolerance  Patient tolerated treatment well    Behavior During Therapy  [facility] for tasks assessed/performed       Past Medical History:  Diagnosis Date  . Eczema   . Frequent UTI   . H/O lymphadenopathy    With previous submandibular node removals.  . Hypertension   . Recurrent sinus infections     Past Surgical History:  Procedure Laterality Date  . COLONOSCOPY WITH PROPOFOL N/A 08/25/2017   Procedure: COLONOSCOPY WITH PROPOFOL;  Surgeon: Lucilla Lame, MD;  Location: [facility] ENDOSCOPY;  Service: Endoscopy;  Laterality: N/A;  . Lymph Node Removal  1984  . NECK SURGERY  1977   H/O Right neck surgery secondary to a benign growth with when patient was 8 or 57 yo  . skin lesion extraction  2013   basal cell - Dr Phillip Heal  . Uterine Polyps Removed  2004    There were no vitals filed for this visit.  Subjective Assessment - 09/23/18 1741    Subjective  Patient reports she was sore after her previous session. patient reports her shoulder pain is not aggravate.     Pertinent History  Chronic L and R shoulder pain, R LE (quad pain)    Limitations  Lifting    Patient Stated Goals  To improve ability to perform overhead activity; less pain with performing ADLs    Currently in Pain?  No/denies    Pain Onset  More than a month ago       TREATMENT Therapeutic Exercise Overhead shoulder flexion with yellow physioball - x 20   Standing rows at [place] - x 20 20# Sitting High row at [employer] - x 20 35# Standing shoulder adduction - x 20 5# ; with a 3# ankle weight on the handle for R UE Standing triceps extension - x 25 10# Serratus punches with GTB in standing - x 25 Quadruped on physio ball with hurdles - x 20  Low row in standing with BlackTB - x 20  Side walking with planks - x 20  Archery with GTB - x 20 B   Patient demonstrates decreased pain at the end of the session  PT Education - 09/23/18 1759    Education Details  form/technique with exercise    Person(s) Educated  Patient    Methods  Explanation;Demonstration    Comprehension  Verbalized understanding;Returned demonstration          PT Long Term Goals - 09/15/18 1735      PT LONG TERM GOAL #1   Title  Patient will improve shoulder ER and IR to full AROM without increase in pain to better be able to turn her car without increase in pain    Baseline  Increased pain with performance; 08/02/2018: L shoulder: full AROM without increase in pain, R shoulder increased pain  at end range for both directions 09/15/2018: full AROM B increased pain with end range IR    Time  6    Period  Weeks    Status  On-going      PT LONG TERM GOAL #2   Title  Patient will have a worst pain of 1/10 in the shoulder to be able to perform ADLs without increase in shoulder pain.    Baseline  3/10 worst pain; L shoulder: 1/10 worst pain; R shoulder 8/10 worst pain; 09/15/2018: 4/10    Time  6    Period  Weeks    Status  On-going      PT LONG TERM GOAL #3   Title  Patient will be independent with HEP to continue benefits of therapy until after discharge.    Baseline  Dependent with form and progression; 08/03/2018: independent for L shoulder exercises, Heavy to moderate cueing to perform R shoulder exercises; 09/15/2018: Independent     Time  6    Period  Weeks    Status  Achieved            Plan - 09/23/18 1801    Clinical Impression Statement  Patient  demonstrates improvement with ability to perform greater amount of persiscpaular musculature based exercises compared to previous sessions. Although patient is improving, she continues to have increased pain with with performing functional occupational tasks. Patient will benefit form further skilled therapy to return to prior level of function.     Rehab Potential  Good    Clinical Impairments Affecting Rehab Potential  (+) highly active (-) Chronicity    PT Frequency  2x / week    PT Duration  6 weeks    PT Treatment/Interventions  Manual techniques;Neuromuscular re-education;Patient/family education;Therapeutic exercise;Therapeutic activities;Iontophoresis 4mg /ml Dexamethasone;Moist Heat;Cryotherapy;Electrical Stimulation;Ultrasound;Passive range of motion;Dry needling;Joint Manipulations    PT Next Visit Plan  address shoulder IR/ER    PT Home Exercise Plan  See education section    Consulted and Agree with Plan of Care  Patient       Patient will benefit from skilled therapeutic intervention in order to improve the following deficits and impairments:  Decreased range of motion, Decreased endurance, Decreased strength, Decreased balance, Pain, Decreased coordination, Decreased mobility, Decreased activity tolerance, Postural dysfunction, Impaired sensation, Increased fascial restricitons  Visit Diagnosis: Muscle weakness (generalized)  Other muscle spasm     Problem List Patient Active Problem List   Diagnosis Date Noted  . Left shoulder pain 05/23/2018  . Snoring 05/23/2018  . Benign neoplasm of descending colon   . Benign neoplasm of ascending colon   . Vaginal atrophy 03/22/2017  . Meniere's disease 08/17/2016  . Colon cancer screening 08/11/2015  . Fatigue 06/03/2015  . Health care maintenance 06/03/2015  . Neck pain 02/05/2014  . Hypercholesterolemia 02/05/2014  . Back pain 12/05/2012  . Trigger finger 12/05/2012  . Essential hypertension, benign 12/05/2012    Blythe Stanford, PT DPT 09/23/2018, 6:15 PM  [facility] [address], [place], [address] Phone: [phone]   Fax:  [phone]  Name: Sara Perry MRN: [id number] Date of Birth: [birth year]

## 2018-10-10 DIAGNOSIS — G4733 Obstructive sleep apnea (adult) (pediatric): Secondary | ICD-10-CM | POA: Diagnosis not present

## 2018-10-11 ENCOUNTER — Ambulatory Visit: Payer: 59

## 2018-10-11 DIAGNOSIS — M25512 Pain in left shoulder: Secondary | ICD-10-CM

## 2018-10-11 DIAGNOSIS — M62838 Other muscle spasm: Secondary | ICD-10-CM | POA: Diagnosis not present

## 2018-10-11 DIAGNOSIS — M6281 Muscle weakness (generalized): Secondary | ICD-10-CM | POA: Diagnosis not present

## 2018-10-11 DIAGNOSIS — G8929 Other chronic pain: Secondary | ICD-10-CM

## 2018-10-11 DIAGNOSIS — M25511 Pain in right shoulder: Secondary | ICD-10-CM | POA: Diagnosis not present

## 2018-10-12 NOTE — Therapy (Signed)
[facility] [address], [place], [address] Phone: [phone]   Fax:  [phone]  Physical Therapy Treatment  Patient Details  Name: Sara Perry MRN: [id number] Date of Birth: 1961-06-01 Referring Provider (PT): Einar Pheasant MD   Encounter Date: 10/11/2018  PT End of Session - 10/12/18 0909    Visit Number  14    Number of Visits  21    Date for PT Re-Evaluation  10/13/18    PT Start Time  1900    PT Stop Time  1945    PT Time Calculation (min)  45 min    Activity Tolerance  Patient tolerated treatment well    Behavior During Therapy  [facility] for tasks assessed/performed       Past Medical History:  Diagnosis Date  . Eczema   . Frequent UTI   . H/O lymphadenopathy    With previous submandibular node removals.  . Hypertension   . Recurrent sinus infections     Past Surgical History:  Procedure Laterality Date  . COLONOSCOPY WITH PROPOFOL N/A 08/25/2017   Procedure: COLONOSCOPY WITH PROPOFOL;  Surgeon: Lucilla Lame, MD;  Location: [facility] ENDOSCOPY;  Service: Endoscopy;  Laterality: N/A;  . Lymph Node Removal  1984  . NECK SURGERY  1977   H/O Right neck surgery secondary to a benign growth with when patient was 73 or 57 yo  . skin lesion extraction  2013   basal cell - Dr Phillip Heal  . Uterine Polyps Removed  2004    There were no vitals filed for this visit.  Subjective Assessment - 10/12/18 0907    Subjective  Patient states improvement but reports she continues to get intermittent pain with working. Patient reports improvement overall with exercise.     Pertinent History  Chronic L and R shoulder pain, R LE (quad pain)    Limitations  Lifting    Patient Stated Goals  To improve ability to perform overhead activity; less pain with performing ADLs    Currently in Pain?  No/denies    Pain Onset  More than a month ago       TREATMENT Therapeutic Exercise Archery with GTB -- x 20 Rows  in standing with GTB -- x 20  Prone shoulder ER -- x 20  Standing shoulder ER with shoulder abducted to 90 -- x 10 Standing shoulder IR with shoulder abducted to 90 -- x 10  Standing shoulder ER at wall -- x 10 with flexion YTB Standing shoulder ER at wall -- x 10 GTB Shoulder Extension in standing -- x 10 GTB Shoulder ER in sidelying -- x 10  No money with ER with YTB -- x 10   Patient demonstrates fatigue at the end of the session    PT Education - 10/12/18 0909    Education Details  form/technique with exercise    Person(s) Educated  Patient    Methods  Explanation;Demonstration    Comprehension  Verbalized understanding;Returned demonstration          PT Long Term Goals - 09/15/18 1735      PT LONG TERM GOAL #1   Title  Patient will improve shoulder ER and IR to full AROM without increase in pain to better be able to turn her car without increase in pain    Baseline  Increased pain with performance; 08/02/2018: L shoulder: full AROM without increase in pain, R shoulder increased pain at end range for both  directions 09/15/2018: full AROM B increased pain with end range IR    Time  6    Period  Weeks    Status  On-going      PT LONG TERM GOAL #2   Title  Patient will have a worst pain of 1/10 in the shoulder to be able to perform ADLs without increase in shoulder pain.    Baseline  3/10 worst pain; L shoulder: 1/10 worst pain; R shoulder 8/10 worst pain; 09/15/2018: 4/10    Time  6    Period  Weeks    Status  On-going      PT LONG TERM GOAL #3   Title  Patient will be independent with HEP to continue benefits of therapy until after discharge.    Baseline  Dependent with form and progression; 08/03/2018: independent for L shoulder exercises, Heavy to moderate cueing to perform R shoulder exercises; 09/15/2018: Independent     Time  6    Period  Weeks    Status  Achieved            Plan - 10/12/18 0913    Clinical Impression Statement  Focused on improving  exercises to be performed at home to continue the improvement with exercise to decrease pain. Patient continues improvement with exercise performance, will have patient perform exercises at home and address limitations. Patient will perform exercises independently to assess ability to maintain symptoms.     Rehab Potential  Good    Clinical Impairments Affecting Rehab Potential  (+) highly active (-) Chronicity    PT Frequency  2x / week    PT Duration  6 weeks    PT Treatment/Interventions  Manual techniques;Neuromuscular re-education;Patient/family education;Therapeutic exercise;Therapeutic activities;Iontophoresis 4mg /ml Dexamethasone;Moist Heat;Cryotherapy;Electrical Stimulation;Ultrasound;Passive range of motion;Dry needling;Joint Manipulations    PT Next Visit Plan  address shoulder IR/ER    PT Home Exercise Plan  See education section    Consulted and Agree with Plan of Care  Patient       Patient will benefit from skilled therapeutic intervention in order to improve the following deficits and impairments:  Decreased range of motion, Decreased endurance, Decreased strength, Decreased balance, Pain, Decreased coordination, Decreased mobility, Decreased activity tolerance, Postural dysfunction, Impaired sensation, Increased fascial restricitons  Visit Diagnosis: Muscle weakness (generalized)  Other muscle spasm  Chronic right shoulder pain  Chronic left shoulder pain     Problem List Patient Active Problem List   Diagnosis Date Noted  . Left shoulder pain 05/23/2018  . Snoring 05/23/2018  . Benign neoplasm of descending colon   . Benign neoplasm of ascending colon   . Vaginal atrophy 03/22/2017  . Meniere's disease 08/17/2016  . Colon cancer screening 08/11/2015  . Fatigue 06/03/2015  . Health care maintenance 06/03/2015  . Neck pain 02/05/2014  . Hypercholesterolemia 02/05/2014  . Back pain 12/05/2012  . Trigger finger 12/05/2012  . Essential hypertension, benign  12/05/2012    Blythe Stanford, PT DPT 10/12/2018, 9:20 AM  [place] [facility] [address] [place], [address] Phone: [phone]   Fax:  [phone]  Name: Sara Perry MRN: [id number] Date of Birth: 24-May-1961

## 2018-11-10 DIAGNOSIS — G4733 Obstructive sleep apnea (adult) (pediatric): Secondary | ICD-10-CM | POA: Diagnosis not present

## 2018-12-09 ENCOUNTER — Ambulatory Visit (INDEPENDENT_AMBULATORY_CARE_PROVIDER_SITE_OTHER): Payer: 59 | Admitting: Family Medicine

## 2018-12-09 ENCOUNTER — Encounter: Payer: Self-pay | Admitting: Family Medicine

## 2018-12-09 VITALS — BP 120/76 | HR 100 | Temp 98.7°F | Resp 16 | Ht 64.0 in | Wt 123.0 lb

## 2018-12-09 DIAGNOSIS — R350 Frequency of micturition: Secondary | ICD-10-CM | POA: Diagnosis not present

## 2018-12-09 DIAGNOSIS — R319 Hematuria, unspecified: Secondary | ICD-10-CM

## 2018-12-09 DIAGNOSIS — N390 Urinary tract infection, site not specified: Secondary | ICD-10-CM

## 2018-12-09 LAB — POCT URINALYSIS DIPSTICK
Bilirubin, UA: NEGATIVE
Glucose, UA: NEGATIVE
Ketones, UA: NEGATIVE
Nitrite, UA: NEGATIVE
Protein, UA: NEGATIVE
Spec Grav, UA: 1.015 (ref 1.010–1.025)
Urobilinogen, UA: 0.2 E.U./dL
pH, UA: 7 (ref 5.0–8.0)

## 2018-12-09 MED ORDER — CEFDINIR 300 MG PO CAPS: 300.0000 mg | ORAL_CAPSULE | Freq: Two times a day (BID) | ORAL | 0 refills | Status: DC

## 2018-12-09 NOTE — Progress Notes (Signed)
Subjective:    [name], female    DOB: December 06, 1960, 58 y.o.   MRN: [id number]  HPI   Patient presents to clinic complaining of increased urinary frequency, burning with urination and some cloudiness to her urine.  Patient states this began last week, tried increasing her water intake and also drinking apple cider vinegar.  States she thought her symptoms were improving at the beginning of the week, but they slowly began to return.  Patient is concerned because as a child she had a very severe UTI that landed her in the hospital.  Patient has not had a UTI in many years.  Denies nausea, vomiting or diarrhea.  Denies fever or chills.  Patient Active Problem List   Diagnosis Date Noted  . Left shoulder pain 05/23/2018  . Snoring 05/23/2018  . Benign neoplasm of descending colon   . Benign neoplasm of ascending colon   . Vaginal atrophy 03/22/2017  . Meniere's disease 08/17/2016  . Colon cancer screening 08/11/2015  . Fatigue 06/03/2015  . Health care maintenance 06/03/2015  . Neck pain 02/05/2014  . Hypercholesterolemia 02/05/2014  . Back pain 12/05/2012  . Trigger finger 12/05/2012  . Essential hypertension, benign 12/05/2012   Social History   Tobacco Use  . Smoking status: Never Smoker  . Smokeless tobacco: Never Used  Substance Use Topics  . Alcohol use: Yes    Alcohol/week: 0.0 standard drinks    Comment: Occasional   Review of Systems  Constitutional: Negative for chills, fatigue and fever.  HENT: Negative for congestion, ear pain, sinus pain and sore throat.   Eyes: Negative.   Respiratory: Negative for cough, shortness of breath and wheezing.   Cardiovascular: Negative for chest pain, palpitations and leg swelling.  Gastrointestinal: Negative for abdominal pain, diarrhea, nausea and vomiting.  Genitourinary: +dysuria, frequency and urgency.  Musculoskeletal: Negative for arthralgias and myalgias.  Skin: Negative for color change, pallor  and rash.  Neurological: Negative for syncope, light-headedness and headaches.  Psychiatric/Behavioral: The patient is not nervous/anxious.       Objective:   Physical Exam Vitals signs and nursing note reviewed.  Constitutional:      General: She is not in acute distress.    Appearance: She is well-developed. She is not toxic-appearing.  HENT:     Head: Normocephalic and atraumatic.  Eyes:     General: No scleral icterus.    Extraocular Movements: Extraocular movements intact.     Conjunctiva/sclera: Conjunctivae normal.  Neck:     Musculoskeletal: Neck supple.     Trachea: No tracheal deviation.  Cardiovascular:     Rate and Rhythm: Normal rate and regular rhythm.     Heart sounds: Normal heart sounds.  Pulmonary:     Effort: Pulmonary effort is normal. No respiratory distress.     Breath sounds: Normal breath sounds.  Abdominal:     General: Bowel sounds are normal. There is no distension.     Palpations: Abdomen is soft. There is no mass.     Tenderness: There is abdominal tenderness (mild suprapubic). There is no right CVA tenderness, left CVA tenderness, guarding or rebound.  Skin:    General: Skin is warm and dry.     Coloration: Skin is not jaundiced or pale.  Neurological:     Mental Status: She is alert and oriented to person, place, and time.     Gait: Gait normal.  Psychiatric:        Mood and Affect: Mood  normal.        Behavior: Behavior normal.    Vitals:   12/09/18 1345  BP: 120/76  Pulse: 100  Resp: 16  Temp: 98.7 F (37.1 C)  SpO2: 96%      Assessment & Plan:   UTI with hematuria - positive blood and leukocytes seen on urinalysis this in combination with physical exam, we will treat with Omnicef twice daily for 5 days.  Urine culture collected and sent to lab.  Patient advised to increase water intake, avoid excess sugar and caffeine, wear cotton underwear, always wipe front to back after using restroom.  She will keep her early scheduled  follow-up with PCP as planned.  She will be contacted with urine culture results when available.

## 2018-12-11 DIAGNOSIS — G4733 Obstructive sleep apnea (adult) (pediatric): Secondary | ICD-10-CM | POA: Diagnosis not present

## 2018-12-11 LAB — URINE CULTURE
MICRO NUMBER:: 251166
SPECIMEN QUALITY: ADEQUATE

## 2018-12-16 ENCOUNTER — Other Ambulatory Visit: Payer: Self-pay | Admitting: Internal Medicine

## 2019-01-04 MED FILL — TELMISARTAN 20 MG TABLET: 20 | 60 days supply | Qty: 60 | Fill #0

## 2019-01-10 DIAGNOSIS — G4733 Obstructive sleep apnea (adult) (pediatric): Secondary | ICD-10-CM | POA: Diagnosis not present

## 2019-01-11 ENCOUNTER — Other Ambulatory Visit: Payer: Self-pay

## 2019-01-11 ENCOUNTER — Other Ambulatory Visit (INDEPENDENT_AMBULATORY_CARE_PROVIDER_SITE_OTHER): Payer: 59

## 2019-01-11 DIAGNOSIS — I10 Essential (primary) hypertension: Secondary | ICD-10-CM | POA: Diagnosis not present

## 2019-01-11 DIAGNOSIS — E7800 Pure hypercholesterolemia, unspecified: Secondary | ICD-10-CM | POA: Diagnosis not present

## 2019-01-11 LAB — BASIC METABOLIC PANEL
BUN: 14 mg/dL (ref 6–23)
CO2: 31 mEq/L (ref 19–32)
Calcium: 10 mg/dL (ref 8.4–10.5)
Chloride: 101 mEq/L (ref 96–112)
Creatinine, Ser: 0.74 mg/dL (ref 0.40–1.20)
GFR: 80.78 mL/min (ref >60.00)
Glucose, Bld: 85 mg/dL (ref 70–99)
Potassium: 4 mEq/L (ref 3.5–5.1)
SODIUM: 139 meq/L (ref 135–145)

## 2019-01-11 LAB — LIPID PANEL
CHOLESTEROL: 232 mg/dL — AB (ref 0–200)
HDL: 57 mg/dL (ref >39.00)
LDL Cholesterol: 149 mg/dL — ABNORMAL HIGH (ref 0–99)
NonHDL: 174.74
Total CHOL/HDL Ratio: 4
Triglycerides: 129 mg/dL (ref 0.0–149.0)
VLDL: 25.8 mg/dL (ref 0.0–40.0)

## 2019-01-11 LAB — HEPATIC FUNCTION PANEL
ALBUMIN: 4.8 g/dL (ref 3.5–5.2)
ALT: 19 U/L (ref 0–35)
AST: 18 U/L (ref 0–37)
Alkaline Phosphatase: 62 U/L (ref 39–117)
Bilirubin, Direct: 0.2 mg/dL (ref 0.0–0.3)
Total Bilirubin: 1.2 mg/dL (ref 0.2–1.2)
Total Protein: 6.9 g/dL (ref 6.0–8.3)

## 2019-01-12 ENCOUNTER — Encounter: Payer: Self-pay | Admitting: Internal Medicine

## 2019-01-13 ENCOUNTER — Encounter: Payer: Self-pay | Admitting: Internal Medicine

## 2019-01-19 ENCOUNTER — Encounter: Payer: Self-pay | Admitting: *Deleted

## 2019-01-19 DIAGNOSIS — I10 Essential (primary) hypertension: Secondary | ICD-10-CM

## 2019-01-19 DIAGNOSIS — E7800 Pure hypercholesterolemia, unspecified: Secondary | ICD-10-CM

## 2019-01-20 NOTE — Telephone Encounter (Signed)
I have placed the order for the labs.  Please schedule a fasting lab appt 1-2 days before 05/26/19 appt.

## 2019-01-20 NOTE — Telephone Encounter (Signed)
I replied to this pt in another message. We were trying to encourage a virtual visit since she did come in for labs but cancelled appt. She is wanting to know if you really think it is necessary.

## 2019-01-24 NOTE — Telephone Encounter (Signed)
LMTCB

## 2019-01-27 ENCOUNTER — Telehealth: Payer: Self-pay

## 2019-01-27 NOTE — Telephone Encounter (Signed)
Copied from [place] [id number]. Topic: General - Other >> Jan 27, 2019  9:38 AM Leward Quan A wrote: Reason for CRM: Patient called to schedule her lab appointment for august. Please advise Ph# [phone]

## 2019-01-28 ENCOUNTER — Telehealth: Payer: Self-pay | Admitting: Internal Medicine

## 2019-01-28 NOTE — Telephone Encounter (Signed)
Copied from [place] [phone]. Topic: Quick Communication - See Telephone Encounter >> Jan 28, 2019 11:35 AM Blase Mess A wrote: CRM for notification. See Telephone encounter for: 01/28/19.  Attempted to reach the office 3x with no answer. The patient is calling to schedule labs in August. CB- [phone] (M) or work-

## 2019-01-28 NOTE — Telephone Encounter (Signed)
Called patient back to schedule lab appt.  LMTCB.

## 2019-01-28 NOTE — Telephone Encounter (Signed)
Left the patient a message to call the office to schedule.

## 2019-02-10 DIAGNOSIS — G4733 Obstructive sleep apnea (adult) (pediatric): Secondary | ICD-10-CM | POA: Diagnosis not present

## 2019-03-09 DIAGNOSIS — G4733 Obstructive sleep apnea (adult) (pediatric): Secondary | ICD-10-CM | POA: Diagnosis not present

## 2019-03-11 DIAGNOSIS — G4733 Obstructive sleep apnea (adult) (pediatric): Secondary | ICD-10-CM | POA: Diagnosis not present

## 2019-03-12 DIAGNOSIS — G4733 Obstructive sleep apnea (adult) (pediatric): Secondary | ICD-10-CM | POA: Diagnosis not present

## 2019-03-21 ENCOUNTER — Other Ambulatory Visit: Payer: Self-pay | Admitting: Internal Medicine

## 2019-03-23 MED FILL — TELMISARTAN 20 MG TABS: 20 | 30 days supply | Qty: 30 | Fill #0

## 2019-03-31 DIAGNOSIS — D225 Melanocytic nevi of trunk: Secondary | ICD-10-CM | POA: Diagnosis not present

## 2019-03-31 DIAGNOSIS — D2262 Melanocytic nevi of left upper limb, including shoulder: Secondary | ICD-10-CM | POA: Diagnosis not present

## 2019-03-31 DIAGNOSIS — D2272 Melanocytic nevi of left lower limb, including hip: Secondary | ICD-10-CM | POA: Diagnosis not present

## 2019-03-31 DIAGNOSIS — L309 Dermatitis, unspecified: Secondary | ICD-10-CM | POA: Diagnosis not present

## 2019-03-31 DIAGNOSIS — D2271 Melanocytic nevi of right lower limb, including hip: Secondary | ICD-10-CM | POA: Diagnosis not present

## 2019-03-31 DIAGNOSIS — D2261 Melanocytic nevi of right upper limb, including shoulder: Secondary | ICD-10-CM | POA: Diagnosis not present

## 2019-04-04 ENCOUNTER — Other Ambulatory Visit: Payer: Self-pay | Admitting: Internal Medicine

## 2019-04-11 DIAGNOSIS — G4733 Obstructive sleep apnea (adult) (pediatric): Secondary | ICD-10-CM | POA: Diagnosis not present

## 2019-04-12 ENCOUNTER — Telehealth: Payer: Self-pay

## 2019-04-12 DIAGNOSIS — Z1231 Encounter for screening mammogram for malignant neoplasm of breast: Secondary | ICD-10-CM

## 2019-04-12 NOTE — Telephone Encounter (Signed)
Order placed for mammogram.

## 2019-04-19 ENCOUNTER — Encounter: Payer: Self-pay | Admitting: Internal Medicine

## 2019-04-22 DIAGNOSIS — G4733 Obstructive sleep apnea (adult) (pediatric): Secondary | ICD-10-CM | POA: Diagnosis not present

## 2019-05-16 ENCOUNTER — Other Ambulatory Visit: Payer: Self-pay | Admitting: Internal Medicine

## 2019-05-24 ENCOUNTER — Other Ambulatory Visit: Payer: Self-pay

## 2019-05-24 ENCOUNTER — Other Ambulatory Visit (INDEPENDENT_AMBULATORY_CARE_PROVIDER_SITE_OTHER): Payer: 59

## 2019-05-24 DIAGNOSIS — E7800 Pure hypercholesterolemia, unspecified: Secondary | ICD-10-CM | POA: Diagnosis not present

## 2019-05-24 DIAGNOSIS — I10 Essential (primary) hypertension: Secondary | ICD-10-CM | POA: Diagnosis not present

## 2019-05-24 DIAGNOSIS — Z1231 Encounter for screening mammogram for malignant neoplasm of breast: Secondary | ICD-10-CM

## 2019-05-24 LAB — HEPATIC FUNCTION PANEL
ALT: 15 U/L (ref 0–35)
AST: 14 U/L (ref 0–37)
Albumin: 4.5 g/dL (ref 3.5–5.2)
Alkaline Phosphatase: 61 U/L (ref 39–117)
Bilirubin, Direct: 0.2 mg/dL (ref 0.0–0.3)
Total Bilirubin: 1 mg/dL (ref 0.2–1.2)
Total Protein: 6.6 g/dL (ref 6.0–8.3)

## 2019-05-24 LAB — CBC WITH DIFFERENTIAL/PLATELET
Basophils Absolute: 0 10*3/uL (ref 0.0–0.1)
Basophils Relative: 0.2 % (ref 0.0–3.0)
Eosinophils Absolute: 0.1 10*3/uL (ref 0.0–0.7)
Eosinophils Relative: 1.7 % (ref 0.0–5.0)
HCT: 39.2 % (ref 36.0–46.0)
Hemoglobin: 13 g/dL (ref 12.0–15.0)
Lymphocytes Relative: 35.5 % (ref 12.0–46.0)
Lymphs Abs: 1.4 10*3/uL (ref 0.7–4.0)
MCHC: 33.2 g/dL (ref 30.0–36.0)
MCV: 97.2 fl (ref 78.0–100.0)
Monocytes Absolute: 0.4 10*3/uL (ref 0.1–1.0)
Monocytes Relative: 9 % (ref 3.0–12.0)
Neutro Abs: 2.2 10*3/uL (ref 1.4–7.7)
Neutrophils Relative %: 53.6 % (ref 43.0–77.0)
Platelets: 297 10*3/uL (ref 150.0–400.0)
RBC: 4.03 Mil/uL (ref 3.87–5.11)
RDW: 12.8 % (ref 11.5–15.5)
WBC: 4 10*3/uL (ref 4.0–10.5)

## 2019-05-24 LAB — LIPID PANEL
Cholesterol: 198 mg/dL (ref 0–200)
HDL: 59.1 mg/dL (ref >39.00)
LDL Cholesterol: 121 mg/dL — ABNORMAL HIGH (ref 0–99)
NonHDL: 139.23
Total CHOL/HDL Ratio: 3
Triglycerides: 93 mg/dL (ref 0.0–149.0)
VLDL: 18.6 mg/dL (ref 0.0–40.0)

## 2019-05-24 LAB — BASIC METABOLIC PANEL
BUN: 17 mg/dL (ref 6–23)
CO2: 29 mEq/L (ref 19–32)
Calcium: 9.8 mg/dL (ref 8.4–10.5)
Chloride: 103 mEq/L (ref 96–112)
Creatinine, Ser: 0.66 mg/dL (ref 0.40–1.20)
GFR: 92.06 mL/min (ref >60.00)
Glucose, Bld: 83 mg/dL (ref 70–99)
Potassium: 4.4 mEq/L (ref 3.5–5.1)
Sodium: 139 mEq/L (ref 135–145)

## 2019-05-24 LAB — TSH: TSH: 1.63 u[IU]/mL (ref 0.35–4.50)

## 2019-05-25 ENCOUNTER — Encounter: Payer: Self-pay | Admitting: Internal Medicine

## 2019-05-26 ENCOUNTER — Other Ambulatory Visit (HOSPITAL_COMMUNITY)
Admission: RE | Admit: 2019-05-26 | Discharge: 2019-05-26 | Disposition: A | Discharge status: Home / Self Care | Payer: 59 | Source: Ambulatory Visit | Attending: Internal Medicine | Admitting: Internal Medicine

## 2019-05-26 ENCOUNTER — Encounter: Payer: Self-pay | Admitting: Internal Medicine

## 2019-05-26 ENCOUNTER — Ambulatory Visit (INDEPENDENT_AMBULATORY_CARE_PROVIDER_SITE_OTHER): Payer: 59 | Admitting: Internal Medicine

## 2019-05-26 ENCOUNTER — Other Ambulatory Visit: Payer: Self-pay

## 2019-05-26 VITALS — BP 118/78 | HR 94 | Temp 98.2°F | Resp 16 | Ht 64.0 in | Wt 119.0 lb

## 2019-05-26 DIAGNOSIS — E7800 Pure hypercholesterolemia, unspecified: Secondary | ICD-10-CM | POA: Diagnosis not present

## 2019-05-26 DIAGNOSIS — Z0000 Encounter for general adult medical examination without abnormal findings: Secondary | ICD-10-CM

## 2019-05-26 DIAGNOSIS — Z1239 Encounter for other screening for malignant neoplasm of breast: Secondary | ICD-10-CM

## 2019-05-26 DIAGNOSIS — Z124 Encounter for screening for malignant neoplasm of cervix: Secondary | ICD-10-CM | POA: Diagnosis not present

## 2019-05-26 DIAGNOSIS — I10 Essential (primary) hypertension: Secondary | ICD-10-CM | POA: Diagnosis not present

## 2019-05-26 DIAGNOSIS — S0592XA Unspecified injury of left eye and orbit, initial encounter: Secondary | ICD-10-CM

## 2019-05-26 DIAGNOSIS — M25512 Pain in left shoulder: Secondary | ICD-10-CM

## 2019-05-26 DIAGNOSIS — M545 Low back pain, unspecified: Secondary | ICD-10-CM

## 2019-05-26 DIAGNOSIS — S0590XA Unspecified injury of unspecified eye and orbit, initial encounter: Secondary | ICD-10-CM | POA: Insufficient documentation

## 2019-05-26 DIAGNOSIS — H43812 Vitreous degeneration, left eye: Secondary | ICD-10-CM | POA: Diagnosis not present

## 2019-05-26 NOTE — Progress Notes (Addendum)
[name], female   DOB: 1961/08/28, 58 y.o.   MRN: [id number]   Subjective:    [name], female    DOB: Aug 22, 1961, 58 y.o.   MRN: [id number]  HPI  Patient here for her physical exam.  She reports she is doing relatively well. Handling stress.  Evaluated by Dr Juanell Fairly 11/2018 for sleep apnea.  Treated with cpap.  Was having shoulder pain.  S/p PT and dry needling.  Is better.  No chest pain.  No sob.  No acid reflux.  No abdominal pain.  Bowels moving.  Mammogram scheduled.  Discussed labs.  Cholesterol improved.  Is s/p left eye injury.  Hit her eye - car door.  Had swelling and pain.  Swelling is better.  Still with some discomfort.  Also reports some black floaters and objects in left eye when looks a certain way.  No actual vision loss.  No headache.     Past Medical History:  Diagnosis Date   Eczema    Frequent UTI    H/O lymphadenopathy    With previous submandibular node removals.   Hypertension    Recurrent sinus infections    Past Surgical History:  Procedure Laterality Date   COLONOSCOPY WITH PROPOFOL N/A 08/25/2017   Procedure: COLONOSCOPY WITH PROPOFOL;  Surgeon: Lucilla Lame, MD;  Location: [facility] ENDOSCOPY;  Service: Endoscopy;  Laterality: N/A;   Lymph Node Removal  1984   NECK SURGERY  1977   H/O Right neck surgery secondary to a benign growth with when patient was 66 or 58 yo   skin lesion extraction  2013   basal cell - Dr Phillip Heal   Uterine Polyps Removed  2004   Family History  Problem Relation Age of Onset   Hyperlipidemia Mother    Thyroid disease Mother    Heart disease Mother        Heart Valve problems   Coronary artery disease Father        Stents placed   Hypertension Other        uncle   Parkinson's disease Paternal Uncle    Parkinson's disease Maternal Grandmother    Heart disease Maternal Grandfather    Heart disease Paternal Grandfather        MI   Breast cancer Neg Hx    Social  History   Socioeconomic History   Marital status: Divorced    Spouse name: Not on file   Number of children: 2   Years of education: Not on file   Highest education level: Not on file  Occupational History    Employer: [employer]  Social Needs   Financial resource strain: Not on file   Food insecurity    Worry: Not on file    Inability: Not on file   Transportation needs    Medical: Not on file    Non-medical: Not on file  Tobacco Use   Smoking status: Never Smoker   Smokeless tobacco: Never Used  Substance and Sexual Activity   Alcohol use: Yes    Alcohol/week: 0.0 standard drinks    Comment: Occasional   Drug use: No   Sexual activity: Not on file  Lifestyle   Physical activity    Days per week: Not on file    Minutes per session: Not on file   Stress: Not on file  Relationships   Social connections    Talks on phone: Not on file    Gets together: Not on file  Attends religious service: Not on file    Active member of club or organization: Not on file    Attends meetings of clubs or organizations: Not on file    Relationship status: Not on file  Other Topics Concern   Not on file  Social History Narrative   Not on file    Outpatient Encounter Medications as of 05/26/2019  Medication Sig   Calcium Carbonate-Vitamin D (CALCIUM-VITAMIN D) 500-200 MG-UNIT per tablet Take 1 tablet by mouth daily.   estradiol (ESTRACE VAGINAL) 0.1 MG/GM vaginal cream One application q hs for 5 nights and then 2 x/week   fish oil-omega-3 fatty acids 1000 MG capsule Take by mouth daily.   fluocinonide (LIDEX) 0.05 % external solution APPLY TO AFFECTED AREA(S) AT BEDTIME   fluocinonide gel (LIDEX) 9.56 % Apply 1 application topically 2 (two) times daily. Apply to affected area twice a day as needed.   Multiple Vitamin (MULTIVITAMIN) tablet Take 1 tablet by mouth daily.   telmisartan (MICARDIS) 20 MG tablet TAKE 1 TABLET BY MOUTH DAILY   tretinoin (RETIN-A) 0.05 %  cream Apply topically at bedtime. Apply to affected area three times a day as needed for Acne   [DISCONTINUED] cefdinir (OMNICEF) 300 MG capsule Take 1 capsule (300 mg total) by mouth 2 (two) times daily.   No facility-administered encounter medications on file as of 05/26/2019.     Review of Systems  Constitutional: Negative for appetite change and unexpected weight change.  HENT: Negative for congestion and sinus pressure.        Eye injury as outlined.    Eyes: Negative for pain and visual disturbance.  Respiratory: Negative for cough, chest tightness and shortness of breath.   Cardiovascular: Negative for chest pain, palpitations and leg swelling.  Gastrointestinal: Negative for abdominal pain, diarrhea, nausea and vomiting.  Genitourinary: Negative for difficulty urinating and dysuria.  Musculoskeletal: Negative for joint swelling and myalgias.  Skin: Negative for color change and rash.  Neurological: Negative for dizziness, light-headedness and headaches.  Hematological: Negative for adenopathy. Does not bruise/bleed easily.  Psychiatric/Behavioral: Negative for agitation and dysphoric mood.       Objective:    Physical Exam Constitutional:      General: She is not in acute distress.    Appearance: Normal appearance. She is well-developed.  HENT:     Right Ear: External ear normal.     Left Ear: External ear normal.  Eyes:     General: No scleral icterus.       Right eye: No discharge.        Left eye: No discharge.     Conjunctiva/sclera: Conjunctivae normal.     Comments: Minimal swelling - lower eyelid.    Neck:     Musculoskeletal: Neck supple. No muscular tenderness.     Thyroid: No thyromegaly.  Cardiovascular:     Rate and Rhythm: Normal rate and regular rhythm.  Pulmonary:     Effort: No tachypnea, accessory muscle usage or respiratory distress.     Breath sounds: Normal breath sounds. No decreased breath sounds or wheezing.  Chest:     Breasts:         Right: No inverted nipple, mass, nipple discharge or tenderness (no axillary adenopathy).        Left: No inverted nipple, mass, nipple discharge or tenderness (no axilarry adenopathy).  Abdominal:     General: Bowel sounds are normal.     Palpations: Abdomen is soft.     Tenderness:  There is no abdominal tenderness.  Musculoskeletal:        General: No swelling or tenderness.  Lymphadenopathy:     Cervical: No cervical adenopathy.  Skin:    Findings: No erythema or rash.  Neurological:     Mental Status: She is alert and oriented to person, place, and time.  Psychiatric:        Mood and Affect: Mood normal.        Behavior: Behavior normal.     BP 118/78    Pulse 94    Temp 98.2 F (36.8 C) (Oral)    Resp 16    Ht 5\' 4"  (1.626 m)    Wt 119 lb (54 kg)    LMP 12/01/2008    SpO2 98%    BMI 20.43 kg/m  Wt Readings from Last 3 Encounters:  05/26/19 119 lb (54 kg)  12/09/18 123 lb (55.8 kg)  09/14/18 118 lb (53.5 kg)     Lab Results  Component Value Date   WBC 4.0 05/24/2019   HGB 13.0 05/24/2019   HCT 39.2 05/24/2019   PLT 297.0 05/24/2019   GLUCOSE 83 05/24/2019   CHOL 198 05/24/2019   TRIG 93.0 05/24/2019   HDL 59.10 05/24/2019   LDLDIRECT 148.9 08/15/2013   LDLCALC 121 (H) 05/24/2019   ALT 15 05/24/2019   AST 14 05/24/2019   NA 139 05/24/2019   K 4.4 05/24/2019   CL 103 05/24/2019   CREATININE 0.66 05/24/2019   BUN 17 05/24/2019   CO2 29 05/24/2019   TSH 1.63 05/24/2019    US Abdomen Complete  Result Date: 08/10/2018 CLINICAL DATA:  Hyperbilirubinemia. EXAM: ABDOMEN ULTRASOUND COMPLETE COMPARISON:  Ultrasound 03/09/2014. FINDINGS: Gallbladder: No gallstones noted on today's exam. No wall thickening visualized. No sonographic Murphy sign noted by sonographer. Common bile duct: Diameter: 1 mm Liver: No focal lesion identified. Within normal limits in parenchymal echogenicity. Portal vein is patent on color Doppler imaging with normal direction of blood flow  towards the liver. IVC: No abnormality visualized. Pancreas: Visualized portion unremarkable. Spleen: Size and appearance within normal limits. Right Kidney: Length: 12.0 cm. Echogenicity within normal limits. No mass or hydronephrosis visualized. Left Kidney: Length: 10.6 cm. Echogenicity within normal limits. No mass or hydronephrosis visualized. Abdominal aorta: No aneurysm visualized. Other findings: None. IMPRESSION: No gallstones noted on today's exam. No evidence of biliary distention. No acute abnormality. If symptoms persist MRI/MRCP can be obtained. Electronically Signed   By: Marcello Moores  Register   On: 08/10/2018 10:27       Assessment & Plan:   Problem List Items Addressed This Visit    Back pain    Doing better.        Essential hypertension, benign    Blood pressure under good control.  Continue same medication regimen.  Follow pressures.  Follow metabolic panel.        Eye injury    Eye injury as outlined.  No vision loss ,but changes as described.  Have ophthalmology evaluate today.        Relevant Orders   Ambulatory referral to Ophthalmology   Health care maintenance    Physical today 05/26/19.  PAP 08/14/16- negative with negative HPV.  Mammogram due. Schedule.  Colonoscopy 08/25/17 - Sessile serrated adenoma. Recommended f/u in 5 years.  Follow up pap smear today.        Hypercholesterolemia    Low cholesterol diet and exercise.  Follow lipid panel.       Relevant Orders  Comprehensive metabolic panel   Lipid panel   Left shoulder pain    Improved with therapy.  Follow.         Other Visit Diagnoses    Routine general medical examination at a health care facility    -  Primary   Breast cancer screening       Cervical cancer screening       Relevant Orders   Cytology - PAP( [facility]) (Completed)       Einar Pheasant, MD

## 2019-05-26 NOTE — Assessment & Plan Note (Addendum)
Physical today 05/26/19.  PAP 08/14/16- negative with negative HPV.  Mammogram due. Schedule.  Colonoscopy 08/25/17 - Sessile serrated adenoma. Recommended f/u in 5 years.  Follow up pap smear today.

## 2019-05-27 LAB — CYTOLOGY - PAP
Diagnosis: NEGATIVE
HPV: NOT DETECTED

## 2019-05-28 ENCOUNTER — Encounter: Payer: Self-pay | Admitting: Internal Medicine

## 2019-05-29 NOTE — Assessment & Plan Note (Signed)
Improved with therapy.  Follow.

## 2019-05-29 NOTE — Assessment & Plan Note (Signed)
Doing better.   

## 2019-05-29 NOTE — Assessment & Plan Note (Signed)
Eye injury as outlined.  No vision loss ,but changes as described.  Have ophthalmology evaluate today.

## 2019-05-29 NOTE — Assessment & Plan Note (Signed)
Blood pressure under good control.  Continue same medication regimen.  Follow pressures.  Follow metabolic panel.   

## 2019-05-29 NOTE — Assessment & Plan Note (Signed)
Low cholesterol diet and exercise.  Follow lipid panel.   

## 2019-05-30 ENCOUNTER — Encounter: Payer: Self-pay | Admitting: Internal Medicine

## 2019-06-02 ENCOUNTER — Encounter: Payer: Self-pay | Admitting: Internal Medicine

## 2019-06-02 NOTE — Telephone Encounter (Signed)
Just need a little clarification.  Does she just need a letter stating no contraindication to exercise?    Dr Nicki Reaper

## 2019-06-03 NOTE — Telephone Encounter (Signed)
Patient is needing letter stating that it is medically necessary for her to exercise and ok to use their equipment.

## 2019-06-04 NOTE — Telephone Encounter (Signed)
Letter typed.  I am not sure if it printed.

## 2019-06-07 ENCOUNTER — Telehealth: Payer: Self-pay

## 2019-06-07 NOTE — Telephone Encounter (Signed)
Copied from [place] [phone]. Topic: General - Other >> Jun 07, 2019 11:36 AM Rainey Pines A wrote: Patient would like a callback in regards to letter that was suppose to be uploaded to her chart. Patient stated that the letter is not there and wants to know if she can come in and pick it up

## 2019-06-07 NOTE — Telephone Encounter (Signed)
Left detailed message for patient.

## 2019-06-07 NOTE — Telephone Encounter (Signed)
lmtcb

## 2019-06-09 ENCOUNTER — Other Ambulatory Visit: Payer: Self-pay

## 2019-06-09 ENCOUNTER — Encounter (INDEPENDENT_AMBULATORY_CARE_PROVIDER_SITE_OTHER): Payer: 59 | Admitting: Ophthalmology

## 2019-06-09 DIAGNOSIS — H43813 Vitreous degeneration, bilateral: Secondary | ICD-10-CM | POA: Diagnosis not present

## 2019-06-09 DIAGNOSIS — H2513 Age-related nuclear cataract, bilateral: Secondary | ICD-10-CM | POA: Diagnosis not present

## 2019-06-09 NOTE — Telephone Encounter (Signed)
See phone note

## 2019-06-09 NOTE — Telephone Encounter (Signed)
Pt is calling making sure her letter is ready for pick up, it is still not in her chart   Pls call at [phone]

## 2019-06-09 NOTE — Telephone Encounter (Signed)
Emailed to patient.

## 2019-07-07 DIAGNOSIS — H43812 Vitreous degeneration, left eye: Secondary | ICD-10-CM | POA: Diagnosis not present

## 2019-08-31 ENCOUNTER — Ambulatory Visit
Admission: RE | Admit: 2019-08-31 | Discharge: 2019-08-31 | Disposition: A | Discharge status: Home / Self Care | Payer: 59 | Source: Ambulatory Visit | Attending: Internal Medicine | Admitting: Internal Medicine

## 2019-08-31 DIAGNOSIS — Z1231 Encounter for screening mammogram for malignant neoplasm of breast: Secondary | ICD-10-CM | POA: Insufficient documentation

## 2019-09-12 ENCOUNTER — Other Ambulatory Visit: Payer: Self-pay | Admitting: Internal Medicine

## 2019-09-15 DIAGNOSIS — D2262 Melanocytic nevi of left upper limb, including shoulder: Secondary | ICD-10-CM | POA: Diagnosis not present

## 2019-09-15 DIAGNOSIS — L538 Other specified erythematous conditions: Secondary | ICD-10-CM | POA: Diagnosis not present

## 2019-09-15 DIAGNOSIS — L820 Inflamed seborrheic keratosis: Secondary | ICD-10-CM | POA: Diagnosis not present

## 2019-09-15 DIAGNOSIS — L309 Dermatitis, unspecified: Secondary | ICD-10-CM | POA: Diagnosis not present

## 2019-09-15 DIAGNOSIS — D2261 Melanocytic nevi of right upper limb, including shoulder: Secondary | ICD-10-CM | POA: Diagnosis not present

## 2019-09-15 DIAGNOSIS — D225 Melanocytic nevi of trunk: Secondary | ICD-10-CM | POA: Diagnosis not present

## 2019-09-29 DIAGNOSIS — G4733 Obstructive sleep apnea (adult) (pediatric): Secondary | ICD-10-CM | POA: Diagnosis not present

## 2019-12-05 ENCOUNTER — Other Ambulatory Visit: Payer: Self-pay

## 2019-12-06 ENCOUNTER — Encounter: Payer: Self-pay | Admitting: Internal Medicine

## 2019-12-06 ENCOUNTER — Other Ambulatory Visit: Payer: Self-pay | Admitting: Family

## 2019-12-06 DIAGNOSIS — M545 Low back pain, unspecified: Secondary | ICD-10-CM

## 2019-12-06 MED ORDER — MELOXICAM 7.5 MG PO TABS: ORAL_TABLET | ORAL | 1 refills | Status: DC

## 2019-12-06 NOTE — Telephone Encounter (Signed)
Pt wanted a call back about the message below  Pleas call pt at [phone]

## 2019-12-06 NOTE — Telephone Encounter (Signed)
Pt calling in with back pain. Has had issues in the past. Confirmed with pt no acute injury noted. Most of the time pain can be controlled with exercise. Has to use antiinflammatories prn for flare ups. She has been taking meloxicam 7.5 mg q day (old rx from a couple yrs ago). Was requesting to have it written as meloxicam 7.5 mg- 1-2 tablets q day prn. Needs sent to [place]. Has a f/u with Dr Nicki Reaper next week. Last kidney function was good. Routing to [place] since Dr Nicki Reaper is out of the office and patient would like this to be handled ASAP.

## 2019-12-07 ENCOUNTER — Other Ambulatory Visit: Payer: 59

## 2019-12-08 ENCOUNTER — Telehealth: Payer: Self-pay | Admitting: Internal Medicine

## 2019-12-08 NOTE — Telephone Encounter (Signed)
Pt called and needed to know if she could take her meloxicam (MOBIC) 7.5 MG tablet in the morning since she is getting her COvid Vaccine

## 2019-12-08 NOTE — Telephone Encounter (Signed)
Pt aware should not be a problem

## 2019-12-12 ENCOUNTER — Other Ambulatory Visit (INDEPENDENT_AMBULATORY_CARE_PROVIDER_SITE_OTHER): Payer: 59

## 2019-12-12 ENCOUNTER — Other Ambulatory Visit: Payer: Self-pay

## 2019-12-12 ENCOUNTER — Other Ambulatory Visit: Payer: 59

## 2019-12-12 DIAGNOSIS — E7800 Pure hypercholesterolemia, unspecified: Secondary | ICD-10-CM

## 2019-12-12 LAB — COMPREHENSIVE METABOLIC PANEL
ALT: 14 U/L (ref 0–35)
AST: 15 U/L (ref 0–37)
Albumin: 4.4 g/dL (ref 3.5–5.2)
Alkaline Phosphatase: 57 U/L (ref 39–117)
BUN: 15 mg/dL (ref 6–23)
CO2: 31 mEq/L (ref 19–32)
Calcium: 9.7 mg/dL (ref 8.4–10.5)
Chloride: 102 mEq/L (ref 96–112)
Creatinine, Ser: 0.73 mg/dL (ref 0.40–1.20)
GFR: 81.79 mL/min (ref >60.00)
Glucose, Bld: 85 mg/dL (ref 70–99)
Potassium: 3.8 mEq/L (ref 3.5–5.1)
Sodium: 141 mEq/L (ref 135–145)
Total Bilirubin: 1.4 mg/dL — ABNORMAL HIGH (ref 0.2–1.2)
Total Protein: 7.1 g/dL (ref 6.0–8.3)

## 2019-12-12 LAB — LIPID PANEL
Cholesterol: 200 mg/dL (ref 0–200)
HDL: 56.1 mg/dL (ref >39.00)
LDL Cholesterol: 117 mg/dL — ABNORMAL HIGH (ref 0–99)
NonHDL: 144.11
Total CHOL/HDL Ratio: 4
Triglycerides: 138 mg/dL (ref 0.0–149.0)
VLDL: 27.6 mg/dL (ref 0.0–40.0)

## 2019-12-12 NOTE — Telephone Encounter (Signed)
Please call and confirm pt doing ok.  Let her know that [facility] can see walk ins.  Let me know if I need to do anything.

## 2019-12-12 NOTE — Telephone Encounter (Signed)
Pt came in with Left LBP x 5 weeks. Pain is shooting down patients left side, used to stop at knee. Now at random time a sharp px goes from left shoulder to ankle. Sx worsen by any movement. Has HX of back issues. Confirmed with pt no MOI.  Most of the time pain can be controlled with exercise. Has to use antiinflammatories prn for flare ups. She has been taking meloxicam 7.5 mg- 1-2 tablets q day prn. Has PE appointment with Dr Nicki Reaper 12-14-2019. Recommended appointment today with [place] due to no availability here today. Also gave another option would be [facility] for S[facility]. Patient stated she is awaiting call back from Neurologist. She also scheduled appointment with [facility] while in exam room 12-15-2019. Patient would prefer to see a specialist then a family medicine provider.

## 2019-12-12 NOTE — Telephone Encounter (Signed)
Pt is in office this morning for lab appt. Pt is still having back pain. It appears that she is having a hard time walking. She would like a referral to a neurologist. She is going to be triaged today.

## 2019-12-12 NOTE — Telephone Encounter (Signed)
Called and gave pt info about [facility] walk in. Patient stated that she is going to go there this PM. Nothing further needed at this time. Confirmed doing ok. Patient is not in as much pain as she was this morning. It is worse in the morning when she has not been up moving around.

## 2019-12-14 ENCOUNTER — Ambulatory Visit (INDEPENDENT_AMBULATORY_CARE_PROVIDER_SITE_OTHER): Payer: 59 | Admitting: Internal Medicine

## 2019-12-14 ENCOUNTER — Telehealth: Payer: Self-pay | Admitting: Internal Medicine

## 2019-12-14 ENCOUNTER — Other Ambulatory Visit: Payer: Self-pay

## 2019-12-14 DIAGNOSIS — E7800 Pure hypercholesterolemia, unspecified: Secondary | ICD-10-CM | POA: Diagnosis not present

## 2019-12-14 DIAGNOSIS — I10 Essential (primary) hypertension: Secondary | ICD-10-CM | POA: Diagnosis not present

## 2019-12-14 DIAGNOSIS — M5442 Lumbago with sciatica, left side: Secondary | ICD-10-CM | POA: Diagnosis not present

## 2019-12-14 NOTE — Progress Notes (Addendum)
[name], female   DOB: March 16, 1961, 59 y.o.   MRN: [id number]   Subjective:    [name], female    DOB: 1961/08/24, 59 y.o.   MRN: [id number]  HPI This visit occurred during the SARS-CoV-2 public health emergency.  Safety protocols were in place, including screening questions prior to the visit, additional usage of staff PPE, and extensive cleaning of exam room while observing appropriate contact time as indicated for disinfecting solutions.  Patient here for her scheduled follow up.  (was scheduled for physical, but had her physical in 05/2019).   She has been having increased left low back pain and pain extending into the buttock and to her thigh - knee.  Increased pain recently.  Light duty at work.  Taking ibuprofen instead of meloxicam.  Some muscle spasm late pm.  Scheduled to see [facility] for further evaluation.  Also reports some increased gas.  Not eating as much.  Gas X helps.  No bladder or bowel incontinence.  Tries to stay active.  No chest pain or sob reported.  Blood pressure doing well.  S/p colonoscopy 08/2017.  Recommended f/u in 5 years.  No acid reflux reported.  No abdominal pain.  Handling stress.    Past Medical History:  Diagnosis Date  . Eczema   . Frequent UTI   . H/O lymphadenopathy    With previous submandibular node removals.  . Hypertension   . Recurrent sinus infections    Past Surgical History:  Procedure Laterality Date  . COLONOSCOPY WITH PROPOFOL N/A 08/25/2017   Procedure: COLONOSCOPY WITH PROPOFOL;  Surgeon: Lucilla Lame, MD;  Location: [facility] ENDOSCOPY;  Service: Endoscopy;  Laterality: N/A;  . Lymph Node Removal  1984  . NECK SURGERY  1977   H/O Right neck surgery secondary to a benign growth with when patient was 67 or 59 yo  . skin lesion extraction  2013   basal cell - Dr Phillip Heal  . Uterine Polyps Removed  2004   Family History  Problem Relation Age of Onset  . Hyperlipidemia Mother   . Thyroid disease  Mother   . Heart disease Mother        Heart Valve problems  . Coronary artery disease Father        Stents placed  . Hypertension Other        uncle  . Parkinson's disease Paternal Uncle   . Parkinson's disease Maternal Grandmother   . Heart disease Maternal Grandfather   . Heart disease Paternal Grandfather        MI  . Breast cancer Neg Hx    Social History   Socioeconomic History  . Marital status: Divorced    Spouse name: Not on file  . Number of children: 2  . Years of education: Not on file  . Highest education level: Not on file  Occupational History    Employer: [employer]  Tobacco Use  . Smoking status: Never Smoker  . Smokeless tobacco: Never Used  Substance and Sexual Activity  . Alcohol use: Yes    Alcohol/week: 0.0 standard drinks    Comment: Occasional  . Drug use: No  . Sexual activity: Not on file  Other Topics Concern  . Not on file  Social History Narrative  . Not on file   Social Determinants of Health   Financial Resource Strain:   . Difficulty of Paying Living Expenses: Not on file  Food Insecurity:   . Worried About Running  Out of Food in the Last Year: Not on file  . Ran Out of Food in the Last Year: Not on file  Transportation Needs:   . Lack of Transportation (Medical): Not on file  . Lack of Transportation (Non-Medical): Not on file  Physical Activity:   . Days of Exercise per Week: Not on file  . Minutes of Exercise per Session: Not on file  Stress:   . Feeling of Stress : Not on file  Social Connections:   . Frequency of Communication with Friends and Family: Not on file  . Frequency of Social Gatherings with Friends and Family: Not on file  . Attends Religious Services: Not on file  . Active Member of Clubs or Organizations: Not on file  . Attends Archivist Meetings: Not on file  . Marital Status: Not on file    Outpatient Encounter Medications as of 12/14/2019  Medication Sig  . Clobetasol Prop Emollient Base 0.05 %  emollient cream Apply topically 2 (two) times daily.  . Calcium Carbonate-Vitamin D (CALCIUM-VITAMIN D) 500-200 MG-UNIT per tablet Take 1 tablet by mouth daily.  Marland Kitchen estradiol (ESTRACE VAGINAL) 0.1 MG/GM vaginal cream One application q hs for 5 nights and then 2 x/week  . fish oil-omega-3 fatty acids 1000 MG capsule Take by mouth daily.  . fluocinonide (LIDEX) 0.05 % external solution APPLY TO AFFECTED AREA(S) AT BEDTIME  . fluocinonide gel (LIDEX) [id number] % Apply 1 application topically 2 (two) times daily. Apply to affected area twice a day as needed.  . meloxicam (MOBIC) 7.5 MG tablet May take one to two tablets daily as needed for pain. Take with food.  . Multiple Vitamin (MULTIVITAMIN) tablet Take 1 tablet by mouth daily.  Marland Kitchen telmisartan (MICARDIS) 20 MG tablet TAKE 1 TABLET BY MOUTH DAILY  . tretinoin (RETIN-A) 0.05 % cream Apply topically at bedtime. Apply to affected area three times a day as needed for Acne   No facility-administered encounter medications on file as of 12/14/2019.   Review of Systems  Constitutional: Negative for appetite change and unexpected weight change.  HENT: Negative for congestion and sinus pressure.   Eyes: Negative for pain and visual disturbance.  Respiratory: Negative for cough, chest tightness and shortness of breath.   Cardiovascular: Negative for chest pain, palpitations and leg swelling.  Gastrointestinal: Negative for abdominal pain, diarrhea, nausea and vomiting.  Genitourinary: Negative for difficulty urinating and dysuria.  Musculoskeletal: Positive for back pain. Negative for joint swelling and myalgias.  Skin: Negative for color change and rash.  Neurological: Negative for dizziness, light-headedness and headaches.  Hematological: Negative for adenopathy. Does not bruise/bleed easily.  Psychiatric/Behavioral: Negative for agitation and dysphoric mood.       Objective:    Physical Exam Constitutional:      General: She is not in acute  distress.    Appearance: Normal appearance.  HENT:     Head: Normocephalic and atraumatic.     Right Ear: External ear normal.     Left Ear: External ear normal.  Eyes:     General: No scleral icterus.       Right eye: No discharge.        Left eye: No discharge.     Conjunctiva/sclera: Conjunctivae normal.  Neck:     Thyroid: No thyromegaly.  Cardiovascular:     Rate and Rhythm: Normal rate and regular rhythm.  Pulmonary:     Effort: No respiratory distress.     Breath sounds: Normal breath sounds.  No wheezing.  Abdominal:     General: Bowel sounds are normal.     Palpations: Abdomen is soft.     Tenderness: There is no abdominal tenderness.  Musculoskeletal:        General: No swelling or tenderness.     Cervical back: Neck supple. No tenderness.  Lymphadenopathy:     Cervical: No cervical adenopathy.  Skin:    Findings: No erythema or rash.  Neurological:     Mental Status: She is alert.  Psychiatric:        Mood and Affect: Mood normal.        Behavior: Behavior normal.     BP 122/70   Pulse 89   Temp 97.6 F (36.4 C)   Resp 16   Ht 5\' 4"  (1.626 m)   Wt 122 lb (55.3 kg)   LMP 12/01/2008   SpO2 99%   BMI 20.94 kg/m  Wt Readings from Last 3 Encounters:  12/14/19 122 lb (55.3 kg)  05/26/19 119 lb (54 kg)  12/09/18 123 lb (55.8 kg)     Lab Results  Component Value Date   WBC 4.0 05/24/2019   HGB 13.0 05/24/2019   HCT 39.2 05/24/2019   PLT 297.0 05/24/2019   GLUCOSE 85 12/12/2019   CHOL 200 12/12/2019   TRIG 138.0 12/12/2019   HDL 56.10 12/12/2019   LDLDIRECT 148.9 08/15/2013   LDLCALC 117 (H) 12/12/2019   ALT 14 12/12/2019   AST 15 12/12/2019   NA 141 12/12/2019   K 3.8 12/12/2019   CL 102 12/12/2019   CREATININE 0.73 12/12/2019   BUN 15 12/12/2019   CO2 31 12/12/2019   TSH 1.63 05/24/2019    MM 3D SCREEN BREAST BILATERAL  Result Date: 09/01/2019 CLINICAL DATA:  Screening. EXAM: DIGITAL SCREENING BILATERAL MAMMOGRAM WITH TOMO AND CAD  COMPARISON:  Previous exam(s). ACR Breast Density Category b: There are scattered areas of fibroglandular density. FINDINGS: There are no findings suspicious for malignancy. Images were processed with CAD. IMPRESSION: No mammographic evidence of malignancy. A result letter of this screening mammogram will be mailed directly to the patient. RECOMMENDATION: Screening mammogram in one year. (Code:SM-B-01Y) BI-RADS CATEGORY  1: Negative. Electronically Signed   By: Lajean Manes M.D.   On: 09/01/2019 11:36       Assessment & Plan:   Problem List Items Addressed This Visit    Back pain    Increased low back pain and pain into buttock down to knee.  Taking ibuprofen.  Helping.  [facility] appt with [facility] for evaluation.  Hold on further w/up - [facility] evaluation.       Essential hypertension, benign    Blood pressure has been under good control.  Continue micardis.  Follow pressures.  Follow metabolic panel.       Hyperbilirubinemia    Slightly elevated total bilirubin.  Probably normal variant.  Recheck liver panel.       Relevant Orders   Hepatic function panel   Hypercholesterolemia    Low cholesterol diet and exercise.  Follow lipid panel.        Relevant Orders   TSH       Einar Pheasant, MD

## 2019-12-14 NOTE — Telephone Encounter (Signed)
When the patient checked out, she said that today's visit was not a physical. This was after I scheduled her for a 6 month follow up. I told her I would have to ask because the notes said to schedule her a 6 month follow up not physical. I informed patient that after I spoke with someone I will call her back and fix the appointment if I need too.

## 2019-12-14 NOTE — Telephone Encounter (Signed)
Noted  

## 2019-12-14 NOTE — Telephone Encounter (Signed)
Per our conversation changed appt to physical on 06/15/20 and also called patient.

## 2019-12-18 ENCOUNTER — Encounter: Payer: Self-pay | Admitting: Internal Medicine

## 2019-12-18 NOTE — Assessment & Plan Note (Signed)
Slightly elevated total bilirubin.  Probably normal variant.  Recheck liver panel.

## 2019-12-18 NOTE — Assessment & Plan Note (Signed)
Low cholesterol diet and exercise.  Follow lipid panel.   

## 2019-12-18 NOTE — Assessment & Plan Note (Signed)
Blood pressure has been under good control.  Continue micardis.  Follow pressures.  Follow metabolic panel.

## 2019-12-18 NOTE — Assessment & Plan Note (Signed)
Increased low back pain and pain into buttock down to knee.  Taking ibuprofen.  Helping.  Has appt with [facility] for evaluation.  Hold on further w/up - [facility] evaluation.

## 2019-12-20 ENCOUNTER — Other Ambulatory Visit: Payer: Self-pay | Admitting: Neurosurgery

## 2019-12-20 DIAGNOSIS — M5136 Other intervertebral disc degeneration, lumbar region: Secondary | ICD-10-CM | POA: Diagnosis not present

## 2019-12-20 DIAGNOSIS — M4726 Other spondylosis with radiculopathy, lumbar region: Secondary | ICD-10-CM | POA: Diagnosis not present

## 2019-12-20 DIAGNOSIS — M5416 Radiculopathy, lumbar region: Secondary | ICD-10-CM | POA: Diagnosis not present

## 2019-12-20 DIAGNOSIS — M4316 Spondylolisthesis, lumbar region: Secondary | ICD-10-CM | POA: Diagnosis not present

## 2019-12-20 DIAGNOSIS — M48062 Spinal stenosis, lumbar region with neurogenic claudication: Secondary | ICD-10-CM

## 2019-12-20 DIAGNOSIS — G9519 Other vascular myelopathies: Secondary | ICD-10-CM

## 2019-12-21 ENCOUNTER — Ambulatory Visit
Admission: RE | Admit: 2019-12-21 | Discharge: 2019-12-21 | Disposition: A | Discharge status: Home / Self Care | Payer: 59 | Source: Ambulatory Visit | Attending: Neurosurgery | Admitting: Neurosurgery

## 2019-12-21 ENCOUNTER — Other Ambulatory Visit: Payer: Self-pay | Admitting: Neurosurgery

## 2019-12-21 ENCOUNTER — Other Ambulatory Visit: Payer: Self-pay

## 2019-12-21 DIAGNOSIS — M48062 Spinal stenosis, lumbar region with neurogenic claudication: Secondary | ICD-10-CM | POA: Diagnosis not present

## 2019-12-21 DIAGNOSIS — M5136 Other intervertebral disc degeneration, lumbar region: Secondary | ICD-10-CM | POA: Diagnosis not present

## 2019-12-21 DIAGNOSIS — N319 Neuromuscular dysfunction of bladder, unspecified: Secondary | ICD-10-CM | POA: Diagnosis not present

## 2019-12-21 DIAGNOSIS — M4316 Spondylolisthesis, lumbar region: Secondary | ICD-10-CM | POA: Diagnosis not present

## 2019-12-21 DIAGNOSIS — M4726 Other spondylosis with radiculopathy, lumbar region: Secondary | ICD-10-CM | POA: Diagnosis not present

## 2019-12-21 DIAGNOSIS — M48061 Spinal stenosis, lumbar region without neurogenic claudication: Secondary | ICD-10-CM | POA: Diagnosis not present

## 2019-12-21 DIAGNOSIS — M5416 Radiculopathy, lumbar region: Secondary | ICD-10-CM | POA: Diagnosis not present

## 2019-12-21 DIAGNOSIS — M5126 Other intervertebral disc displacement, lumbar region: Secondary | ICD-10-CM | POA: Diagnosis not present

## 2019-12-21 DIAGNOSIS — G9519 Other vascular myelopathies: Secondary | ICD-10-CM

## 2019-12-22 ENCOUNTER — Telehealth: Payer: Self-pay | Admitting: Internal Medicine

## 2019-12-22 ENCOUNTER — Encounter: Payer: Self-pay | Admitting: Internal Medicine

## 2019-12-22 NOTE — Telephone Encounter (Signed)
Pt called and cancelled lab appt for 01/02/20. She said that Dr. Durene Cal has scheduled her for surgery on 12/26/19 and she is not sure when Dr. Nicki Reaper wants her to do the labs? She wants Dr. Nicki Reaper to know about the surgery and what she suggests.

## 2019-12-22 NOTE — Telephone Encounter (Signed)
Looks like she was just coming in for hepatic function. TSH is not due to be drawn until her cpe in August

## 2019-12-23 ENCOUNTER — Other Ambulatory Visit: Payer: Self-pay | Admitting: Neurosurgery

## 2019-12-23 ENCOUNTER — Other Ambulatory Visit: Payer: Self-pay

## 2019-12-23 ENCOUNTER — Encounter (HOSPITAL_COMMUNITY): Payer: Self-pay | Admitting: Neurosurgery

## 2019-12-23 ENCOUNTER — Other Ambulatory Visit
Admission: RE | Admit: 2019-12-23 | Discharge: 2019-12-23 | Disposition: A | Discharge status: Home / Self Care | Payer: 59 | Source: Ambulatory Visit | Attending: Neurosurgery | Admitting: Neurosurgery

## 2019-12-23 DIAGNOSIS — Z01812 Encounter for preprocedural laboratory examination: Secondary | ICD-10-CM | POA: Diagnosis not present

## 2019-12-23 DIAGNOSIS — Z20822 Contact with and (suspected) exposure to covid-19: Secondary | ICD-10-CM | POA: Insufficient documentation

## 2019-12-23 NOTE — Telephone Encounter (Signed)
Patient is aware. Does not need anything at this time.

## 2019-12-23 NOTE — Telephone Encounter (Signed)
Per review of lab orders, I have ordered a tsh and liver panel.  I am ok if she needs to reschedule these labs.  Let me know if she needs anything more from me or if we need to do anything more.  Also, keep [place] posted regarding how she is doing.

## 2019-12-23 NOTE — Telephone Encounter (Signed)
See phone note

## 2019-12-23 NOTE — Progress Notes (Signed)
Spoke with pt for pre-op call. Pt denies cardiac history or Diabetes. Pt is treated for HTN.   Pt wants to make sure staff know that she is very sensitive to medications, usually needing 1/2 dose of whatever might be ordered.   Pt will get her Covid test done this afternoon, she is aware that she needs to be in quarantine after getting the test done until she comes to the hospital on Monday. Pt did receive her 1st Covid vaccine on 12/09/19.  Reviewed Visitation policy with pt and she voiced understanding.

## 2019-12-24 LAB — SARS CORONAVIRUS 2 (TAT 6-24 HRS): SARS Coronavirus 2: NEGATIVE

## 2019-12-26 ENCOUNTER — Inpatient Hospital Stay (HOSPITAL_COMMUNITY): Payer: 59 | Admitting: Anesthesiology

## 2019-12-26 ENCOUNTER — Inpatient Hospital Stay (HOSPITAL_COMMUNITY)
Admission: RE | Admit: 2019-12-26 | Discharge: 2019-12-27 | DRG: 454 | Disposition: A | Discharge status: Home / Self Care | Payer: 59 | Attending: Neurosurgery | Admitting: Neurosurgery

## 2019-12-26 ENCOUNTER — Other Ambulatory Visit: Payer: Self-pay

## 2019-12-26 ENCOUNTER — Inpatient Hospital Stay (HOSPITAL_COMMUNITY): Payer: 59

## 2019-12-26 ENCOUNTER — Encounter (HOSPITAL_COMMUNITY): Payer: Self-pay | Admitting: Neurosurgery

## 2019-12-26 ENCOUNTER — Encounter (HOSPITAL_COMMUNITY)
Admission: RE | Disposition: A | Discharge status: Home / Self Care | Payer: Self-pay | Source: Home / Self Care | Attending: Neurosurgery

## 2019-12-26 DIAGNOSIS — M5136 Other intervertebral disc degeneration, lumbar region: Secondary | ICD-10-CM | POA: Diagnosis not present

## 2019-12-26 DIAGNOSIS — M5126 Other intervertebral disc displacement, lumbar region: Secondary | ICD-10-CM | POA: Diagnosis not present

## 2019-12-26 DIAGNOSIS — Z20822 Contact with and (suspected) exposure to covid-19: Secondary | ICD-10-CM | POA: Diagnosis present

## 2019-12-26 DIAGNOSIS — M4326 Fusion of spine, lumbar region: Secondary | ICD-10-CM | POA: Diagnosis not present

## 2019-12-26 DIAGNOSIS — M47816 Spondylosis without myelopathy or radiculopathy, lumbar region: Secondary | ICD-10-CM | POA: Diagnosis not present

## 2019-12-26 DIAGNOSIS — G834 Cauda equina syndrome: Secondary | ICD-10-CM | POA: Diagnosis present

## 2019-12-26 DIAGNOSIS — M47896 Other spondylosis, lumbar region: Secondary | ICD-10-CM | POA: Diagnosis not present

## 2019-12-26 DIAGNOSIS — E7800 Pure hypercholesterolemia, unspecified: Secondary | ICD-10-CM | POA: Diagnosis present

## 2019-12-26 DIAGNOSIS — I10 Essential (primary) hypertension: Secondary | ICD-10-CM | POA: Diagnosis present

## 2019-12-26 DIAGNOSIS — Z419 Encounter for procedure for purposes other than remedying health state, unspecified: Secondary | ICD-10-CM

## 2019-12-26 DIAGNOSIS — Z79899 Other long term (current) drug therapy: Secondary | ICD-10-CM

## 2019-12-26 DIAGNOSIS — M48062 Spinal stenosis, lumbar region with neurogenic claudication: Principal | ICD-10-CM | POA: Diagnosis present

## 2019-12-26 DIAGNOSIS — M4316 Spondylolisthesis, lumbar region: Secondary | ICD-10-CM | POA: Diagnosis present

## 2019-12-26 DIAGNOSIS — Z981 Arthrodesis status: Secondary | ICD-10-CM | POA: Diagnosis not present

## 2019-12-26 HISTORY — DX: Sleep apnea, unspecified: G47.30

## 2019-12-26 HISTORY — PX: LUMBAR FUSION: SHX111

## 2019-12-26 HISTORY — DX: Other complications of anesthesia, initial encounter: T88.59XA

## 2019-12-26 LAB — BASIC METABOLIC PANEL
Anion gap: 8 (ref 5–15)
BUN: 17 mg/dL (ref 6–20)
CO2: 27 mmol/L (ref 22–32)
Calcium: 9.6 mg/dL (ref 8.9–10.3)
Chloride: 107 mmol/L (ref 98–111)
Creatinine, Ser: 0.66 mg/dL (ref 0.44–1.00)
GFR calc Af Amer: >60 mL/min (ref >60)
GFR calc non Af Amer: >60 mL/min (ref >60)
Glucose, Bld: 86 mg/dL (ref 70–99)
Potassium: 3.5 mmol/L (ref 3.5–5.1)
Sodium: 142 mmol/L (ref 135–145)

## 2019-12-26 LAB — TYPE AND SCREEN
ABO/RH(D): O POS
Antibody Screen: NEGATIVE

## 2019-12-26 LAB — CBC
HCT: 44 % (ref 36.0–46.0)
Hemoglobin: 14 g/dL (ref 12.0–15.0)
MCH: 32 pg (ref 26.0–34.0)
MCHC: 31.8 g/dL (ref 30.0–36.0)
MCV: 100.7 fL — ABNORMAL HIGH (ref 80.0–100.0)
Platelets: 314 10*3/uL (ref 150–400)
RBC: 4.37 MIL/uL (ref 3.87–5.11)
RDW: 12 % (ref 11.5–15.5)
WBC: 5.6 10*3/uL (ref 4.0–10.5)
nRBC: 0 % (ref 0.0–0.2)

## 2019-12-26 LAB — ABO/RH: ABO/RH(D): O POS

## 2019-12-26 SURGERY — POSTERIOR LUMBAR FUSION 1 LEVEL
Anesthesia: General | Site: Spine Lumbar

## 2019-12-26 MED ORDER — SUGAMMADEX SODIUM 200 MG/2ML IV SOLN
INTRAVENOUS | Status: DC | PRN
  Administered 2019-12-26: 110 mg via INTRAVENOUS

## 2019-12-26 MED ORDER — MIDAZOLAM HCL 2 MG/2ML IJ SOLN
INTRAMUSCULAR | Status: AC
  Filled 2019-12-26: qty 2

## 2019-12-26 MED ORDER — LACTATED RINGERS IV SOLN: INTRAVENOUS | Status: DC

## 2019-12-26 MED ORDER — LIDOCAINE-EPINEPHRINE 1 %-1:100000 IJ SOLN
INTRAMUSCULAR | Status: AC
  Filled 2019-12-26: qty 1

## 2019-12-26 MED ORDER — PROPOFOL 10 MG/ML IV BOLUS
INTRAVENOUS | Status: AC
  Filled 2019-12-26: qty 20

## 2019-12-26 MED ORDER — IRBESARTAN 75 MG PO TABS
75.0000 mg | ORAL_TABLET | Freq: Every day | ORAL | Status: DC
  Filled 2019-12-26 (×2): qty 1

## 2019-12-26 MED ORDER — ACETAMINOPHEN 650 MG RE SUPP: 650.0000 mg | RECTAL | Status: DC | PRN

## 2019-12-26 MED ORDER — FENTANYL CITRATE (PF) 100 MCG/2ML IJ SOLN
INTRAMUSCULAR | Status: AC
  Filled 2019-12-26: qty 2

## 2019-12-26 MED ORDER — OXYCODONE HCL 5 MG PO TABS
5.0000 mg | ORAL_TABLET | Freq: Once | ORAL | Status: AC | PRN
  Administered 2019-12-26: 5 mg via ORAL

## 2019-12-26 MED ORDER — ROCURONIUM BROMIDE 10 MG/ML (PF) SYRINGE
PREFILLED_SYRINGE | INTRAVENOUS | Status: AC
  Filled 2019-12-26: qty 10

## 2019-12-26 MED ORDER — ONDANSETRON HCL 4 MG PO TABS: 4.0000 mg | ORAL_TABLET | Freq: Four times a day (QID) | ORAL | Status: DC | PRN

## 2019-12-26 MED ORDER — HYDROMORPHONE HCL 1 MG/ML IJ SOLN
INTRAMUSCULAR | Status: AC
  Filled 2019-12-26: qty 0.5

## 2019-12-26 MED ORDER — MENTHOL 3 MG MT LOZG: 1.0000 | LOZENGE | OROMUCOSAL | Status: DC | PRN

## 2019-12-26 MED ORDER — CHLORHEXIDINE GLUCONATE CLOTH 2 % EX PADS: 6.0000 | MEDICATED_PAD | Freq: Once | CUTANEOUS | Status: DC

## 2019-12-26 MED ORDER — BUPIVACAINE HCL (PF) 0.25 % IJ SOLN
INTRAMUSCULAR | Status: AC
  Filled 2019-12-26: qty 30

## 2019-12-26 MED ORDER — DEXAMETHASONE SODIUM PHOSPHATE 10 MG/ML IJ SOLN
INTRAMUSCULAR | Status: DC | PRN
  Administered 2019-12-26: 10 mg via INTRAVENOUS

## 2019-12-26 MED ORDER — HYDROXYZINE HCL 25 MG PO TABS
50.0000 mg | ORAL_TABLET | ORAL | Status: DC | PRN
  Administered 2019-12-26: 50 mg via ORAL
  Filled 2019-12-26: qty 2

## 2019-12-26 MED ORDER — PHENYLEPHRINE 40 MCG/ML (10ML) SYRINGE FOR IV PUSH (FOR BLOOD PRESSURE SUPPORT)
PREFILLED_SYRINGE | INTRAVENOUS | Status: AC
  Filled 2019-12-26: qty 10

## 2019-12-26 MED ORDER — KETOROLAC TROMETHAMINE 30 MG/ML IJ SOLN
30.0000 mg | Freq: Once | INTRAMUSCULAR | Status: AC
  Administered 2019-12-26: 30 mg via INTRAVENOUS

## 2019-12-26 MED ORDER — MIDAZOLAM HCL 5 MG/5ML IJ SOLN
INTRAMUSCULAR | Status: DC | PRN
  Administered 2019-12-26: 2 mg via INTRAVENOUS

## 2019-12-26 MED ORDER — HYDRALAZINE HCL 20 MG/ML IJ SOLN
INTRAMUSCULAR | Status: AC
  Filled 2019-12-26: qty 1

## 2019-12-26 MED ORDER — CYCLOBENZAPRINE HCL 5 MG PO TABS
5.0000 mg | ORAL_TABLET | Freq: Three times a day (TID) | ORAL | Status: DC | PRN
  Administered 2019-12-26: 10 mg via ORAL

## 2019-12-26 MED ORDER — ONDANSETRON HCL 4 MG/2ML IJ SOLN
INTRAMUSCULAR | Status: DC | PRN
  Administered 2019-12-26: 4 mg via INTRAVENOUS

## 2019-12-26 MED ORDER — BUPIVACAINE HCL (PF) 0.5 % IJ SOLN
INTRAMUSCULAR | Status: DC | PRN
  Administered 2019-12-26: 11 mL

## 2019-12-26 MED ORDER — THROMBIN 20000 UNITS EX SOLR
CUTANEOUS | Status: AC
  Filled 2019-12-26: qty 20000

## 2019-12-26 MED ORDER — THROMBIN 5000 UNITS EX SOLR: OROMUCOSAL | Status: DC | PRN

## 2019-12-26 MED ORDER — SODIUM CHLORIDE 0.9 % IV SOLN: INTRAVENOUS | Status: DC | PRN

## 2019-12-26 MED ORDER — KETOROLAC TROMETHAMINE 30 MG/ML IJ SOLN
30.0000 mg | Freq: Four times a day (QID) | INTRAMUSCULAR | Status: DC
  Administered 2019-12-26 – 2019-12-27 (×3): 30 mg via INTRAVENOUS
  Filled 2019-12-26 (×3): qty 1

## 2019-12-26 MED ORDER — 0.9 % SODIUM CHLORIDE (POUR BTL) OPTIME
TOPICAL | Status: DC | PRN
  Administered 2019-12-26: 1000 mL

## 2019-12-26 MED ORDER — ROCURONIUM BROMIDE 10 MG/ML (PF) SYRINGE
PREFILLED_SYRINGE | INTRAVENOUS | Status: DC | PRN
  Administered 2019-12-26: 10 mg via INTRAVENOUS
  Administered 2019-12-26: 50 mg via INTRAVENOUS
  Administered 2019-12-26: 10 mg via INTRAVENOUS
  Administered 2019-12-26: 20 mg via INTRAVENOUS
  Administered 2019-12-26: 30 mg via INTRAVENOUS
  Administered 2019-12-26: 20 mg via INTRAVENOUS

## 2019-12-26 MED ORDER — PHENYLEPHRINE HCL-NACL 10-0.9 MG/250ML-% IV SOLN
INTRAVENOUS | Status: DC | PRN
  Administered 2019-12-26: 20 ug/min via INTRAVENOUS

## 2019-12-26 MED ORDER — ACETAMINOPHEN 325 MG PO TABS: 650.0000 mg | ORAL_TABLET | ORAL | Status: DC | PRN

## 2019-12-26 MED ORDER — LIDOCAINE-EPINEPHRINE 1 %-1:100000 IJ SOLN
INTRAMUSCULAR | Status: DC | PRN
  Administered 2019-12-26: 11 mL

## 2019-12-26 MED ORDER — LIDOCAINE 2% (20 MG/ML) 5 ML SYRINGE
INTRAMUSCULAR | Status: DC | PRN
  Administered 2019-12-26: 40 mg via INTRAVENOUS

## 2019-12-26 MED ORDER — SODIUM CHLORIDE 0.9% FLUSH: 3.0000 mL | Freq: Two times a day (BID) | INTRAVENOUS | Status: DC

## 2019-12-26 MED ORDER — FENTANYL CITRATE (PF) 250 MCG/5ML IJ SOLN
INTRAMUSCULAR | Status: AC
  Filled 2019-12-26: qty 5

## 2019-12-26 MED ORDER — THROMBIN 5000 UNITS EX SOLR
CUTANEOUS | Status: AC
  Filled 2019-12-26: qty 5000

## 2019-12-26 MED ORDER — LIDOCAINE 2% (20 MG/ML) 5 ML SYRINGE
INTRAMUSCULAR | Status: AC
  Filled 2019-12-26: qty 5

## 2019-12-26 MED ORDER — ACETAMINOPHEN 10 MG/ML IV SOLN
INTRAVENOUS | Status: AC
  Filled 2019-12-26: qty 100

## 2019-12-26 MED ORDER — PROPOFOL 10 MG/ML IV BOLUS
INTRAVENOUS | Status: DC | PRN
  Administered 2019-12-26: 140 mg via INTRAVENOUS

## 2019-12-26 MED ORDER — ONDANSETRON HCL 4 MG/2ML IJ SOLN
INTRAMUSCULAR | Status: AC
  Filled 2019-12-26: qty 2

## 2019-12-26 MED ORDER — HYDROMORPHONE HCL 1 MG/ML IJ SOLN
INTRAMUSCULAR | Status: DC | PRN
  Administered 2019-12-26: .5 mg via INTRAVENOUS

## 2019-12-26 MED ORDER — ACETAMINOPHEN 10 MG/ML IV SOLN
INTRAVENOUS | Status: DC | PRN
  Administered 2019-12-26: 1000 mg via INTRAVENOUS

## 2019-12-26 MED ORDER — THROMBIN 20000 UNITS EX SOLR: CUTANEOUS | Status: DC | PRN

## 2019-12-26 MED ORDER — HYDROCODONE-ACETAMINOPHEN 5-325 MG PO TABS: 1.0000 | ORAL_TABLET | ORAL | Status: DC | PRN

## 2019-12-26 MED ORDER — CEFAZOLIN SODIUM-DEXTROSE 2-4 GM/100ML-% IV SOLN
INTRAVENOUS | Status: AC
  Filled 2019-12-26: qty 100

## 2019-12-26 MED ORDER — MORPHINE SULFATE (PF) 4 MG/ML IV SOLN: 4.0000 mg | INTRAVENOUS | Status: DC | PRN

## 2019-12-26 MED ORDER — KCL IN DEXTROSE-NACL 40-5-0.45 MEQ/L-%-% IV SOLN
INTRAVENOUS | Status: DC
  Filled 2019-12-26: qty 1000

## 2019-12-26 MED ORDER — DEXAMETHASONE SODIUM PHOSPHATE 10 MG/ML IJ SOLN
INTRAMUSCULAR | Status: AC
  Filled 2019-12-26: qty 1

## 2019-12-26 MED ORDER — PHENYLEPHRINE 40 MCG/ML (10ML) SYRINGE FOR IV PUSH (FOR BLOOD PRESSURE SUPPORT)
PREFILLED_SYRINGE | INTRAVENOUS | Status: DC | PRN
  Administered 2019-12-26 (×3): 80 ug via INTRAVENOUS

## 2019-12-26 MED ORDER — ONDANSETRON HCL 4 MG/2ML IJ SOLN: 4.0000 mg | Freq: Four times a day (QID) | INTRAMUSCULAR | Status: DC | PRN

## 2019-12-26 MED ORDER — PHENOL 1.4 % MT LIQD: 1.0000 | OROMUCOSAL | Status: DC | PRN

## 2019-12-26 MED ORDER — ALUM & MAG HYDROXIDE-SIMETH 200-200-20 MG/5ML PO SUSP: 30.0000 mL | Freq: Four times a day (QID) | ORAL | Status: DC | PRN

## 2019-12-26 MED ORDER — FENTANYL CITRATE (PF) 250 MCG/5ML IJ SOLN
INTRAMUSCULAR | Status: DC | PRN
  Administered 2019-12-26: 100 ug via INTRAVENOUS
  Administered 2019-12-26 (×3): 50 ug via INTRAVENOUS

## 2019-12-26 MED ORDER — CYCLOBENZAPRINE HCL 10 MG PO TABS
ORAL_TABLET | ORAL | Status: AC
  Filled 2019-12-26: qty 1

## 2019-12-26 MED ORDER — KETOROLAC TROMETHAMINE 30 MG/ML IJ SOLN
INTRAMUSCULAR | Status: AC
  Filled 2019-12-26: qty 1

## 2019-12-26 MED ORDER — OXYCODONE HCL 5 MG PO TABS
ORAL_TABLET | ORAL | Status: AC
  Filled 2019-12-26: qty 1

## 2019-12-26 MED ORDER — ONDANSETRON HCL 4 MG/2ML IJ SOLN
4.0000 mg | Freq: Once | INTRAMUSCULAR | Status: AC | PRN
  Administered 2019-12-26: 4 mg via INTRAVENOUS

## 2019-12-26 MED ORDER — HYDROXYZINE HCL 50 MG/ML IM SOLN: 50.0000 mg | INTRAMUSCULAR | Status: DC | PRN

## 2019-12-26 MED ORDER — MAGNESIUM HYDROXIDE 400 MG/5ML PO SUSP: 30.0000 mL | Freq: Every day | ORAL | Status: DC | PRN

## 2019-12-26 MED ORDER — CEFAZOLIN SODIUM-DEXTROSE 2-4 GM/100ML-% IV SOLN
2.0000 g | INTRAVENOUS | Status: AC
  Administered 2019-12-26: 2 g via INTRAVENOUS

## 2019-12-26 MED ORDER — SODIUM CHLORIDE 0.9% FLUSH: 3.0000 mL | INTRAVENOUS | Status: DC | PRN

## 2019-12-26 MED ORDER — BISACODYL 10 MG RE SUPP: 10.0000 mg | Freq: Every day | RECTAL | Status: DC | PRN

## 2019-12-26 MED ORDER — LACTATED RINGERS IV SOLN: INTRAVENOUS | Status: DC | PRN

## 2019-12-26 MED ORDER — FENTANYL CITRATE (PF) 100 MCG/2ML IJ SOLN
25.0000 ug | INTRAMUSCULAR | Status: DC | PRN
  Administered 2019-12-26 (×3): 25 ug via INTRAVENOUS

## 2019-12-26 MED ORDER — FLEET ENEMA 7-19 GM/118ML RE ENEM: 1.0000 | ENEMA | Freq: Once | RECTAL | Status: DC | PRN

## 2019-12-26 MED ORDER — OXYCODONE HCL 5 MG/5ML PO SOLN: 5.0000 mg | Freq: Once | ORAL | Status: AC | PRN

## 2019-12-26 SURGICAL SUPPLY — 68 items
BAG DECANTER FOR FLEXI CONT (MISCELLANEOUS) ×2 IMPLANT
BENZOIN TINCTURE PRP APPL 2/3 (GAUZE/BANDAGES/DRESSINGS) ×2 IMPLANT
BLADE CLIPPER SURG (BLADE) IMPLANT
BUR ACRON 5.0MM COATED (BURR) ×2 IMPLANT
BUR MATCHSTICK NEURO 3.0 LAGG (BURR) ×2 IMPLANT
CAGE TRITAN 8X23X6D (Cage) ×4 IMPLANT
CANISTER SUCT 3000ML PPV (MISCELLANEOUS) ×2 IMPLANT
CAP LCK SPNE (Orthopedic Implant) ×4 IMPLANT
CAP LOCK SPINE RADIUS (Orthopedic Implant) ×4 IMPLANT
CAP LOCKING (Orthopedic Implant) ×4 IMPLANT
CARTRIDGE OIL MAESTRO DRILL (MISCELLANEOUS) ×1 IMPLANT
CNTNR URN SCR LID CUP LEK RST (MISCELLANEOUS) ×1 IMPLANT
CONT SPEC 4OZ STRL OR WHT (MISCELLANEOUS) ×1
COVER BACK TABLE 60X90IN (DRAPES) ×2 IMPLANT
COVER WAND RF STERILE (DRAPES) ×2 IMPLANT
DERMABOND ADVANCED (GAUZE/BANDAGES/DRESSINGS) ×1
DERMABOND ADVANCED .7 DNX12 (GAUZE/BANDAGES/DRESSINGS) ×1 IMPLANT
DIFFUSER DRILL AIR PNEUMATIC (MISCELLANEOUS) ×2 IMPLANT
DRAPE C-ARM 42X72 X-RAY (DRAPES) ×4 IMPLANT
DRAPE C-ARMOR (DRAPES) IMPLANT
DRAPE HALF SHEET 40X57 (DRAPES) IMPLANT
DRAPE LAPAROTOMY 100X72X124 (DRAPES) ×2 IMPLANT
ELECT REM PT RETURN 9FT ADLT (ELECTROSURGICAL) ×2
ELECTRODE REM PT RTRN 9FT ADLT (ELECTROSURGICAL) ×1 IMPLANT
GAUZE 4X4 16PLY RFD (DISPOSABLE) IMPLANT
GAUZE SPONGE 4X4 12PLY STRL (GAUZE/BANDAGES/DRESSINGS) ×2 IMPLANT
GLOVE BIO SURGEON STRL SZ7 (GLOVE) ×6 IMPLANT
GLOVE BIOGEL M 8.0 STRL (GLOVE) ×2 IMPLANT
GLOVE BIOGEL PI IND STRL 7.0 (GLOVE) ×4 IMPLANT
GLOVE BIOGEL PI IND STRL 7.5 (GLOVE) ×3 IMPLANT
GLOVE BIOGEL PI IND STRL 8 (GLOVE) ×3 IMPLANT
GLOVE BIOGEL PI INDICATOR 7.0 (GLOVE) ×4
GLOVE BIOGEL PI INDICATOR 7.5 (GLOVE) ×3
GLOVE BIOGEL PI INDICATOR 8 (GLOVE) ×3
GLOVE ECLIPSE 7.5 STRL STRAW (GLOVE) ×6 IMPLANT
GOWN STRL REUS W/ TWL LRG LVL3 (GOWN DISPOSABLE) ×3 IMPLANT
GOWN STRL REUS W/ TWL XL LVL3 (GOWN DISPOSABLE) ×2 IMPLANT
GOWN STRL REUS W/TWL 2XL LVL3 (GOWN DISPOSABLE) IMPLANT
GOWN STRL REUS W/TWL LRG LVL3 (GOWN DISPOSABLE) ×3
GOWN STRL REUS W/TWL XL LVL3 (GOWN DISPOSABLE) ×2
KIT BASIN OR (CUSTOM PROCEDURE TRAY) ×2 IMPLANT
KIT INFUSE SMALL (Orthopedic Implant) ×2 IMPLANT
KIT TURNOVER KIT B (KITS) ×2 IMPLANT
MILL MEDIUM DISP (BLADE) ×2 IMPLANT
NEEDLE SPNL 18GX3.5 QUINCKE PK (NEEDLE) ×2 IMPLANT
NEEDLE SPNL 22GX3.5 QUINCKE BK (NEEDLE) ×4 IMPLANT
NS IRRIG 1000ML POUR BTL (IV SOLUTION) ×2 IMPLANT
OIL CARTRIDGE MAESTRO DRILL (MISCELLANEOUS) ×2
PACK LAMINECTOMY NEURO (CUSTOM PROCEDURE TRAY) ×2 IMPLANT
PATTIES SURGICAL .5 X1 (DISPOSABLE) ×2 IMPLANT
ROD 5.5X30MM (Rod) ×2 IMPLANT
ROD RADIUS 35MM (Rod) ×2 IMPLANT
SCREW 5.75X40M (Screw) ×4 IMPLANT
SCREW 5.75X45MM (Screw) ×4 IMPLANT
SPONGE LAP 4X18 RFD (DISPOSABLE) IMPLANT
SPONGE SURGIFOAM ABS GEL 100 (HEMOSTASIS) ×2 IMPLANT
STRIP BIOACTIVE VITOSS 25X100X (Neuro Prosthesis/Implant) ×2 IMPLANT
STRIP BIOACTIVE VITOSS 25X52X4 (Orthopedic Implant) ×2 IMPLANT
SUT VIC AB 1 CT1 18XBRD ANBCTR (SUTURE) ×2 IMPLANT
SUT VIC AB 1 CT1 8-18 (SUTURE) ×2
SUT VIC AB 2-0 CP2 18 (SUTURE) ×4 IMPLANT
SUT VIC AB 3-0 SH 8-18 (SUTURE) ×4 IMPLANT
SYR CONTROL 10ML LL (SYRINGE) ×2 IMPLANT
TAPE CLOTH SURG 4X10 WHT LF (GAUZE/BANDAGES/DRESSINGS) ×2 IMPLANT
TOWEL GREEN STERILE (TOWEL DISPOSABLE) ×2 IMPLANT
TOWEL GREEN STERILE FF (TOWEL DISPOSABLE) ×2 IMPLANT
TRAY FOLEY MTR SLVR 16FR STAT (SET/KITS/TRAYS/PACK) ×2 IMPLANT
WATER STERILE IRR 1000ML POUR (IV SOLUTION) ×2 IMPLANT

## 2019-12-26 NOTE — H&P (Signed)
Subjective: Patient is a 59 y.o. right-handed white female who is admitted for treatment of severe L4-5 multifactorial lumbar stenosis contributed to by a grade 2-3 degenerative spondylolisthesis, hypertrophic facet arthropathy, broad-based disc herniation, and ligamentum flavum thickening.  She has presented with disabling neurogenic claudication, and her MRI scan revealed a markedly dilated bladder indicating a neurogenic bladder due to cauda equina compression.  Patient admitted now for a L4-5 lumbar decompression including laminectomy, facetectomy, and foraminotomies and stabilization including posterior lumbar interbody arthrodesis with interbody implants and bone graft (if feasible) and posterior lateral arthrodesis with posterior instrumentation and bone graft.  Symptomatically she has been having disabling low back pain, left worse than right, that extends down to the buttocks, posterior thigh, and calf bilaterally, again left worse than right.  Patient Active Problem List   Diagnosis Date Noted  . Hyperbilirubinemia 12/14/2019  . Eye injury 05/26/2019  . Left shoulder pain 05/23/2018  . Snoring 05/23/2018  . Benign neoplasm of descending colon   . Benign neoplasm of ascending colon   . Vaginal atrophy 03/22/2017  . Meniere's disease 08/17/2016  . Colon cancer screening 08/11/2015  . Fatigue 06/03/2015  . Health care maintenance 06/03/2015  . Neck pain 02/05/2014  . Hypercholesterolemia 02/05/2014  . Back pain 12/05/2012  . Trigger finger 12/05/2012  . Essential hypertension, benign 12/05/2012   Past Medical History:  Diagnosis Date  . Complication of anesthesia    pt states she is very sensitive to most drugs, usually needs 1/2 of whatever other people get. She states as a child/teenager she had difficulty waking up after surgery due to the sensitivity that she has to drugs.  . Eczema   . Frequent UTI   . H/O lymphadenopathy    With previous submandibular node removals.  .  Hypertension   . Recurrent sinus infections   . Sleep apnea    uses c-pap    Past Surgical History:  Procedure Laterality Date  . COLONOSCOPY WITH PROPOFOL N/A 08/25/2017   Procedure: COLONOSCOPY WITH PROPOFOL;  Surgeon: Lucilla Lame, MD;  Location: [facility] ENDOSCOPY;  Service: Endoscopy;  Laterality: N/A;  . Lymph Node Removal  1984  . NECK SURGERY  1977   H/O Right neck surgery secondary to a benign growth with when patient was 76 or 59 yo  . skin lesion extraction  2013   basal cell - Dr Phillip Heal  . Uterine Polyps Removed  2004    Medications Prior to Admission  Medication Sig Dispense Refill Last Dose  . acetaminophen (TYLENOL) 325 MG tablet Take 325 mg by mouth every 4 (four) hours as needed for mild pain or moderate pain.    12/26/2019 at 0730  . Calcium Carb-Cholecalciferol (CALCIUM-VITAMIN D) 600-400 MG-UNIT TABS Take 1 tablet by mouth daily.    Past Week at Unknown time  . Clobetasol Prop Emollient Base 0.05 % emollient cream Apply 1 application topically See admin instructions. Two time a week as needed for eczema   Past Week at Unknown time  . estradiol (ESTRACE VAGINAL) 0.1 MG/GM vaginal cream One application q hs for 5 nights and then 2 x/week (Patient taking differently: Place 1 Applicatorful vaginally once a week. ) 42.5 g 1 Past Week at Unknown time  . fluocinonide (LIDEX) 0.05 % external solution APPLY TO AFFECTED AREA(S) AT BEDTIME (Patient taking differently: Apply 1 application topically See admin instructions. Use once or twice a week) 60 mL 0 12/25/2019 at Unknown time  . GLUCOSAMINE-CHONDROITIN PO Take 1 tablet by mouth daily.  750/600 / [place] 1.5  mg 30 mg vitamin C Manganese 1 mg   Past Week at Unknown time  . ibuprofen (ADVIL) 200 MG tablet Take 200 mg by mouth every 4 (four) hours as needed for mild pain or moderate pain.    Past Week at Unknown time  . meloxicam (MOBIC) 7.5 MG tablet May take one to two tablets daily as needed for pain. Take with food. (Patient taking  differently: Take 7.5-15 mg by mouth daily as needed for pain. daily as needed for pain. Take with food.) 30 tablet 1 Past Week at Unknown time  . Multiple Vitamin (MULTIVITAMIN) tablet Take 1 tablet by mouth daily. Centrum Silver 50+   Past Week at Unknown time  . telmisartan (MICARDIS) 20 MG tablet TAKE 1 TABLET BY MOUTH DAILY (Patient taking differently: Take 20 mg by mouth daily. ) 90 tablet 1 12/25/2019 at Unknown time  . tretinoin (RETIN-A) 0.05 % cream Apply 1 application topically every 30 (thirty) days. Apply to affected area as needed for Acne   Past Week at Unknown time  . fluocinonide gel (LIDEX) [id number] % Apply 1 application topically 2 (two) times daily. Apply to affected area twice a day as needed. (Patient not taking: Reported on 12/22/2019) 60 g 0 Not Taking at Unknown time   Allergies  Allergen Reactions  . Voltaren [Diclofenac Sodium]     Liver problem    Social History   Tobacco Use  . Smoking status: Never Smoker  . Smokeless tobacco: Never Used  Substance Use Topics  . Alcohol use: Yes    Alcohol/week: 0.0 standard drinks    Comment: Occasional    Family History  Problem Relation Age of Onset  . Hyperlipidemia Mother   . Thyroid disease Mother   . Heart disease Mother        Heart Valve problems  . Coronary artery disease Father        Stents placed  . Hypertension Other        uncle  . Parkinson's disease Paternal Uncle   . Parkinson's disease Maternal Grandmother   . Heart disease Maternal Grandfather   . Heart disease Paternal Grandfather        MI  . Breast cancer Neg Hx      Review of Systems Pertinent items noted in HPI and remainder of comprehensive ROS otherwise negative.  Objective: Vital signs in last 24 hours: Temp:  [98.1 F (36.7 C)] 98.1 F (36.7 C) (03/15 0856) Pulse Rate:  [88] 88 (03/15 0856) Resp:  [20] 20 (03/15 0856) BP: (147)/(97) 147/97 (03/15 0856) SpO2:  [100 %] 100 % (03/15 0856) Weight:  [54.4 kg] 54.4 kg (03/15  0856)  EXAM: Patient is well-developed well-nourished white female in no acute distress.   Lungs are clear to auscultation , the patient has symmetrical respiratory excursion. Heart has a regular rate and rhythm normal S1 and S2 no murmur.   Abdomen is soft nontender nondistended bowel sounds are present. Extremity examination shows no clubbing cyanosis or edema. Motor examination shows 5 over 5 strength in the lower extremities including the iliopsoas quadriceps dorsiflexor extensor hallicus  longus and plantar flexor bilaterally. Sensation is intact to pinprick in the distal lower extremities. Reflexes are symmetrical bilaterally. No pathologic reflexes are present. Patient has a normal gait and stance.  Data Review:CBC    Component Value Date/Time   WBC 5.6 12/26/2019 0939   RBC 4.37 12/26/2019 0939   HGB 14.0 12/26/2019 0939   HGB  13.4 12/04/2012 0928   HCT 44.0 12/26/2019 0939   HCT 40.3 12/04/2012 0928   PLT 314 12/26/2019 0939   PLT 260 12/04/2012 0928   MCV 100.7 (H) 12/26/2019 0939   MCV 98 12/04/2012 0928   MCH 32.0 12/26/2019 0939   MCHC 31.8 12/26/2019 0939   RDW 12.0 12/26/2019 0939   RDW 12.7 12/04/2012 0928   LYMPHSABS 1.4 05/24/2019 0804   LYMPHSABS 1.3 12/04/2012 0928   MONOABS 0.4 05/24/2019 0804   MONOABS 0.3 12/04/2012 0928   EOSABS 0.1 05/24/2019 0804   EOSABS 0.1 12/04/2012 0928   BASOSABS 0.0 05/24/2019 0804   BASOSABS 0.0 12/04/2012 0928                          BMET    Component Value Date/Time   NA 142 12/26/2019 0939   K 3.5 12/26/2019 0939   CL 107 12/26/2019 0939   CO2 27 12/26/2019 0939   GLUCOSE 86 12/26/2019 0939   BUN 17 12/26/2019 0939   CREATININE 0.66 12/26/2019 0939   CALCIUM 9.6 12/26/2019 0939   GFRNONAA >60 12/26/2019 0939   GFRAA >60 12/26/2019 0939     Assessment/Plan: Patient with severe L4-5 multifactorial lumbar stenosis with neurogenic claudication and neurogenic bladder, who is admitted for lumbar decompression and  arthrodesis.  I've discussed with the patient the nature of his condition, the nature the surgical procedure, the typical length of surgery, hospital stay, and overall recuperation, the limitations postoperatively, and risks of surgery. I discussed risks including risks of infection, bleeding, possibly need for transfusion, the risk of nerve root dysfunction with pain, weakness, numbness, or paresthesias, the risk of dural tear and CSF leakage and possible need for further surgery, the risk of failure of the arthrodesis and possibly for further surgery, the risk of anesthetic complications including myocardial infarction, stroke, pneumonia, and death. We discussed the need for postoperative immobilization in a lumbar brace. Understanding all this the patient does wish to proceed with surgery and is admitted for such.  Hosie Spangle, MD 12/26/2019 10:14 AM

## 2019-12-26 NOTE — Op Note (Signed)
12/26/2019  3:23 PM  PATIENT:  Sara Perry  59 y.o. female  PRE-OPERATIVE DIAGNOSIS: Severe multifactorial L4-5 lumbar stenosis with neurogenic claudication; neurogenic bladder secondary to cauda equina compression; grade 2-3 degenerative spondylolisthesis; lumbar spondylosis; lumbar HNP  POST-OPERATIVE DIAGNOSIS:  Severe multifactorial L4-5 lumbar stenosis with neurogenic claudication; neurogenic bladder secondary to cauda equina compression; grade 2-3 degenerative spondylolisthesis; lumbar spondylosis; lumbar HNP  PROCEDURE:  Procedure(s): Bilateral L4-5 decompression including L4 and L5 decompressive lumbar laminectomy, bilateral L4-5 facetectomy, foraminotomies for decompression of stenotic compression of exiting L4 and L5 nerve roots bilaterally, with decompression beyond that required for interbody arthrodesis; bilateral L4-5 posterior lumbar interbody arthrodesis with Tritanium interbody implants, Vitoss BA with bone marrow aspirate, and infuse; bilateral L4-5 posterior lateral arthrodesis with nonsegmental radius posterior instrumentation, Vitoss BA with bone marrow aspirate, and infuse  SURGEON: Jovita Gamma, MD  ASSISTANTS: Kary Kos, MD  ANESTHESIA:   general  EBL:  Total I/O In: 1900 [I.V.:1700; IV Piggyback:200] Out: 1025 [Urine:775; Blood:250]  BLOOD ADMINISTERED:none  CELL SAVER GIVEN: Cell Saver technician felt that there was insufficient blood loss to return blood to the patient.  COUNT: Correct per nursing staff  DICTATION: Patient is brought to the operating room placed under general endotracheal anesthesia. The patient was turned to prone position the lumbar region was prepped with Betadine soap and solution and draped in a sterile fashion. The midline was infiltrated with local anesthesia with epinephrine. A localizing x-ray was taken and then a midline incision was made carried down through the subcutaneous tissue, bipolar cautery and electrocautery were  used to maintain hemostasis. Dissection was carried down to the lumbar fascia. The fascia was incised bilaterally and the paraspinal muscles were dissected with a spinous process and lamina in a subperiosteal fashion. Another x-ray was taken for localization and the L4-5 level was localized. Dissection was then carried out laterally over the markedly hypertrophic facet complexes and the transverse processes of L4 and L5 were exposed and decorticated.  Bilateral L4 and L5 laminectomy was performed using the high-speed drill, Kerrison punches, and double-action rongeurs.  The thickened ligamentum flavum was carefully dissected from the thecal sac and nerve roots and removed, decompressing the spinal canal and lateral recesses.  We then proceeded with the bilateral L4-5 facetectomy, and foraminotomies were performed to decompress the stenotic compression of the exiting L4 and L5 nerve roots bilaterally. Once the decompression of the stenotic compression of the thecal sac and exiting nerve roots was completed we proceeded with the posterior lumbar interbody arthrodesis. The annulus was incised bilaterally and the disc space entered. A thorough discectomy was performed using pituitary rongeurs and curettes. Once the discectomy was completed we began to prepare the endplate surfaces removing the cartilaginous endplates surface. We then measured the height of the intervertebral disc space. We selected 8 x 23 x 6 interbody implants.  The C-arm fluoroscope was then draped and brought in the field and we identified the pedicle entry points bilaterally at the L4 and L5 levels. Each of the 4 pedicles was probed, we aspirated bone marrow aspirate from the vertebral bodies, this was injected over a 10 cc and a 5 cc strip of Vitoss BA. Then each of the pedicles was examined with the ball probe good bony surfaces were found and no bony cuts were found. Each of the pedicles was then tapped with a 5.25 mm tap, again examined  with the ball probe good threading was found and no bony cuts were found. We then placed 5.75  millimeter screws bilaterally at each level.  We used 45 mm screws at L4 and 40 mm screws at L5.  We then packed the interbody implants with Vitoss BA with bone marrow aspirate and infuse, and then placed the first implant and on the right side, carefully retracting the thecal sac and nerve root medially. We then went back to the left side and packed the midline with additional Vitoss BA with bone marrow aspirate and infuse, and then placed a second implant on the left side again retracting the thecal sac and nerve root medially. Additional Vitoss BA with bone marrow aspirate and infuse was packed lateral to the implants.  We then packed the lateral gutter over the transverse processes and intertransverse space with Vitoss BA with bone marrow aspirate and infuse. We then selected prelordosed rods.  We used a 35 mm on the left and a 30 mm on the right.  They were placed within the screw heads and secured with locking caps.  Once all 4 locking caps were placed final tightening was performed against a counter torque.  The wound had been irrigated multiple times during the procedure with saline solution and bacitracin solution, good hemostasis was established with a combination of bipolar cautery and Gelfoam with thrombin.  A thin layer of Surgifoam was applied.  Once good hemostasis was confirmed we proceeded with closure.  Paraspinal muscles and deep fascia were closed in separate layers.  Scarpa's fascia closed with interrupted inverted 2-0 undyed Vicryl sutures.  The subcutaneous and subcuticular closed with interrupted inverted 3-0 undyed Vicryl sutures.  The skin edges were approximated with Dermabond.  The wound was dressed with sterile gauze and Hypafix.  Following surgery the patient was turned back to the supine position to be reversed and the anesthetic extubated and transferred to the recovery room for  further care.  PLAN OF CARE: Admit to inpatient   PATIENT DISPOSITION:  PACU - hemodynamically stable.   Delay start of Pharmacological VTE agent (>24hrs) due to surgical blood loss or risk of bleeding:  yes

## 2019-12-26 NOTE — Anesthesia Postprocedure Evaluation (Signed)
Anesthesia Post Note  Patient: Sara Perry  Procedure(s) Performed: LUMBAR FOUR- LUMBAR FIVE DECOMPRESSION, POSTERIOR LUMBAR INTERBODY FUSION, POSTERIOR LATERAL ARTHRODESIS (N/A Spine Lumbar)     Patient location during evaluation: PACU Anesthesia Type: General Level of consciousness: awake and alert Pain management: pain level controlled Vital Signs Assessment: post-procedure vital signs reviewed and stable Respiratory status: spontaneous breathing, nonlabored ventilation, respiratory function stable and patient connected to nasal cannula oxygen Cardiovascular status: blood pressure returned to baseline and stable Postop Assessment: no apparent nausea or vomiting Anesthetic complications: no    Last Vitals:  Vitals:   12/26/19 1550 12/26/19 1605  BP: 110/76 106/76  Pulse: 98 92  Resp: 13 12  Temp:    SpO2: 96% 96%    Last Pain:  Vitals:   12/26/19 1605  TempSrc:   PainSc: 5                  Belladonna Lubinski COKER

## 2019-12-26 NOTE — Anesthesia Procedure Notes (Signed)
Procedure Name: Intubation Date/Time: 12/26/2019 11:05 AM Performed by: Renato Shin, CRNA Pre-anesthesia Checklist: Patient identified, Emergency Drugs available, Suction available and Patient being monitored Patient Re-evaluated:Patient Re-evaluated prior to induction Oxygen Delivery Method: Circle system utilized Preoxygenation: Pre-oxygenation with 100% oxygen Induction Type: IV induction Ventilation: Mask ventilation without difficulty Laryngoscope Size: Miller and 2 Grade View: Grade I Tube type: Oral Tube size: 7.0 mm Number of attempts: 1 Airway Equipment and Method: Stylet and Oral airway Placement Confirmation: ETT inserted through vocal cords under direct vision,  positive ETCO2 and breath sounds checked- equal and bilateral Secured at: 22 cm Tube secured with: Tape Dental Injury: Teeth and Oropharynx as per pre-operative assessment

## 2019-12-26 NOTE — Transfer of Care (Signed)
Immediate Anesthesia Transfer of Care Note  Patient: Sara Perry  Procedure(s) Performed: LUMBAR FOUR- LUMBAR FIVE DECOMPRESSION, POSTERIOR LUMBAR INTERBODY FUSION, POSTERIOR LATERAL ARTHRODESIS (N/A Spine Lumbar)  Patient Location: PACU  Anesthesia Type:General  Level of Consciousness: awake, drowsy and patient cooperative  Airway & Oxygen Therapy: Patient Spontanous Breathing and Patient connected to nasal cannula oxygen  Post-op Assessment: Report given to RN and Post -op Vital signs reviewed and stable  Post vital signs: Reviewed and stable  Last Vitals:  Vitals Value Taken Time  BP 112/82 12/26/19 1535  Temp    Pulse 100 12/26/19 1538  Resp 15 12/26/19 1538  SpO2 96 % 12/26/19 1538  Vitals shown include unvalidated device data.  Last Pain:  Vitals:   12/26/19 0940  TempSrc:   PainSc: 7       Patients Stated Pain Goal: 3 ([id number])  Complications: No apparent anesthesia complications

## 2019-12-26 NOTE — Anesthesia Preprocedure Evaluation (Addendum)
Anesthesia Evaluation  Patient identified by MRN, date of birth, ID band Patient awake    Reviewed: Allergy & Precautions, NPO status , Patient's Chart, lab work & pertinent test results  Airway Mallampati: II  TM Distance: >3 FB Neck ROM: Full    Dental  (+) Teeth Intact, Dental Advisory Given   Pulmonary    breath sounds clear to auscultation       Cardiovascular hypertension,  Rhythm:Regular Rate:Normal     Neuro/Psych    GI/Hepatic   Endo/Other    Renal/GU      Musculoskeletal   Abdominal   Peds  Hematology   Anesthesia Other Findings   Reproductive/Obstetrics                             Anesthesia Physical Anesthesia Plan  ASA: II  Anesthesia Plan: General   Post-op Pain Management:    Induction: Intravenous  PONV Risk Score and Plan: Ondansetron and Dexamethasone  Airway Management Planned: Oral ETT  Additional Equipment:   Intra-op Plan:   Post-operative Plan: Extubation in OR  Informed Consent: I have reviewed the patients History and Physical, chart, labs and discussed the procedure including the risks, benefits and alternatives for the proposed anesthesia with the patient or authorized representative who has indicated his/her understanding and acceptance.     Dental advisory given  Plan Discussed with: CRNA and Anesthesiologist  Anesthesia Plan Comments:         Anesthesia Quick Evaluation  

## 2019-12-26 NOTE — Progress Notes (Signed)
Vitals:   12/26/19 1605 12/26/19 1620 12/26/19 1635 12/26/19 1722  BP: 106/76 113/74 114/67 125/77  Pulse: 92 86 89 83  Resp: 12 15 10 18   Temp:   99.5 F (37.5 C) 98.3 F (36.8 C)  TempSrc:      SpO2: 96% 95% 95%   Weight:      Height:        CBC Recent Labs    12/26/19 0939  WBC 5.6  HGB 14.0  HCT 44.0  PLT 314   BMET Recent Labs    12/26/19 0939  NA 142  K 3.5  CL 107  CO2 27  GLUCOSE 86  BUN 17  CREATININE 0.66  CALCIUM 9.6    Patient resting in bed, comfortable.  Disabling pain through lower extremities (neurogenic claudication) relieved by surgery.  Dressing clean and dry.  Foley to straight drainage.  Has not yet ambulated, due to still being sedated from anesthetic.  Plan: Recommended ambulating in the halls at least twice tonight, and more tomorrow.  We will plan on [place] Foley in a.m, and monitoring voiding function.  Will DC dressing in a.m.  Hosie Spangle, MD 12/26/2019, 7:09 PM

## 2019-12-27 MED ORDER — HYDROCODONE-ACETAMINOPHEN 5-325 MG PO TABS: 1.0000 | ORAL_TABLET | ORAL | 0 refills | Status: DC | PRN

## 2019-12-27 NOTE — Discharge Summary (Signed)
Physician Discharge Summary  [name] MRN: [id number] DOB/AGE: [birth year] 59 y.o.  Admit date: 12/26/2019 Discharge date: 12/27/2019  Admission Diagnoses:  Severe multifactorial L4-5 lumbar stenosis with neurogenic claudication; neurogenic bladder secondary to cauda equina compression; grade 2-3 degenerative spondylolisthesis; lumbar spondylosis; lumbar HNP  Discharge Diagnoses:  Severe multifactorial L4-5 lumbar stenosis with neurogenic claudication; neurogenic bladder secondary to cauda equina compression; grade 2-3 degenerative spondylolisthesis; lumbar spondylosis; lumbar HNP Active Problems:   Lumbar stenosis with neurogenic claudication   Discharged Condition: good  Hospital Course: Patient was admitted, underwent an L4-5 lumbar decompression and arthrodesis.  Postoperatively she has had excellent relief of her disabling neurogenic claudication.  She continues to have a lack of a sense of needing to void, but is able to void,, and her postvoid residual is less than 100 cc.  Her dressing was removed, and her incision is clean and dry.  She is up and ambulating actively in the halls.  She has been given instruction regarding wound care and activities following discharge.  She is scheduled for follow-up with me in the office in 3 weeks.  Discharge Exam: Blood pressure 120/79, pulse (!) 111, temperature 98.6 F (37 C), temperature source Oral, resp. rate 16, height 5\' 4"  (1.626 m), weight 54.4 kg, last menstrual period 12/01/2008, SpO2 100 %.  Disposition: Discharge disposition: 01-Home or Self Care       Discharge Instructions    Discharge wound care:   Complete by: As directed    Leave the wound open to air. Shower daily with the wound uncovered. Water and soapy water should run over the incision area. Do not wash directly on the incision for 2 weeks. Remove the glue after 2 weeks.   Driving Restrictions   Complete by: As directed    No driving for 2 weeks.  May ride in the car locally now. May begin to drive locally in 2 weeks.   Other Restrictions   Complete by: As directed    Walk gradually increasing distances out in the fresh air at least twice a day. Walking additional 6 times inside the house, gradually increasing distances, daily. No bending, lifting, or twisting. Perform activities between shoulder and waist height (that is at counter height when standing or table height when sitting).     Allergies as of 12/27/2019      Reactions   Voltaren [diclofenac Sodium]    Liver problem      Medication List    TAKE these medications   acetaminophen 325 MG tablet Commonly known as: TYLENOL Take 325 mg by mouth every 4 (four) hours as needed for mild pain or moderate pain.   Calcium-Vitamin D 600-400 MG-UNIT Tabs Take 1 tablet by mouth daily.   Clobetasol Prop Emollient Base 0.05 % emollient cream Apply 1 application topically See admin instructions. Two time a week as needed for eczema   estradiol 0.1 MG/GM vaginal cream Commonly known as: ESTRACE VAGINAL One application q hs for 5 nights and then 2 x/week What changed:   how much to take  how to take this  when to take this  additional instructions   fluocinonide 0.05 % external solution Commonly known as: LIDEX APPLY TO AFFECTED AREA(S) AT BEDTIME What changed:   See the new instructions.  Another medication with the same name was removed. Continue taking this medication, and follow the directions you see here.   GLUCOSAMINE-CHONDROITIN PO Take 1 tablet by mouth daily. 750/600 / Boron 1.5  mg 30  mg vitamin C Manganese 1 mg   HYDROcodone-acetaminophen 5-325 MG tablet Commonly known as: NORCO/VICODIN Take 1-2 tablets by mouth every 4 (four) hours as needed (pain).   ibuprofen 200 MG tablet Commonly known as: ADVIL Take 200 mg by mouth every 4 (four) hours as needed for mild pain or moderate pain.   meloxicam 7.5 MG tablet Commonly known as: MOBIC May take one  to two tablets daily as needed for pain. Take with food. What changed:   how much to take  how to take this  when to take this  reasons to take this  additional instructions   multivitamin tablet Take 1 tablet by mouth daily. Centrum Silver 50+   telmisartan 20 MG tablet Commonly known as: MICARDIS TAKE 1 TABLET BY MOUTH DAILY   tretinoin 0.05 % cream Commonly known as: RETIN-A Apply 1 application topically every 30 (thirty) days. Apply to affected area as needed for Acne            Discharge Care Instructions  (From admission, onward)         Start     Ordered   12/27/19 0000  Discharge wound care:    Comments: Leave the wound open to air. Shower daily with the wound uncovered. Water and soapy water should run over the incision area. Do not wash directly on the incision for 2 weeks. Remove the glue after 2 weeks.   12/27/19 0846           Signed: Hosie Spangle 12/27/2019, 8:46 AM

## 2019-12-27 NOTE — Discharge Instructions (Signed)
  Return to Work Will be discussed at you follow up appointment. Call Your Doctor If Any of These Occur Redness, drainage, or swelling at the wound.  Temperature greater than 101 degrees. Severe pain not relieved by pain medication. Incision starts to come apart. Follow Up Appt Call today for appointment in 3 weeks [id number]) or for problems.  If you have any hardware placed in your spine, you will need an x-ray before your appointment.

## 2019-12-27 NOTE — Plan of Care (Signed)
Patient alert and oriented, mae's well, voiding adequate amount of urine, swallowing without difficulty, no c/o pain at time of discharge. Patient discharged home with family. Script and discharged instructions given to patient. Patient and family stated understanding of instructions given. Patient has an appointment with Dr. Nudelman 

## 2019-12-29 ENCOUNTER — Other Ambulatory Visit: Payer: Self-pay | Admitting: *Deleted

## 2019-12-29 ENCOUNTER — Encounter: Payer: Self-pay | Admitting: *Deleted

## 2019-12-29 MED FILL — Sodium Chloride IV Soln 0.9%: INTRAVENOUS | Qty: 2000 | Status: AC

## 2019-12-29 MED FILL — Heparin Sodium (Porcine) Inj 1000 Unit/ML: INTRAMUSCULAR | Qty: 30 | Status: AC

## 2019-12-29 NOTE — Patient Outreach (Signed)
[facility]) Care Management  12/29/2019  Chrystle Gallus 05/30/1961 [id number]   Transition of care call/case closure   Referral received:12/23/19 Initial outreach:12/29/19 Insurance: [employer]    Subjective: Initial successful telephone call to patient's preferred number in order to complete transition of care assessment; 2 HIPAA identifiers verified. Explained purpose of call and completed transition of care assessment.  Alzira reports doing better today, states yesterday was harder. She denies post-operative problems, says surgical incisions are unremarkable, states surgical pain well managed with prescribed medications. She discussed slow for appetite to return but beginning to eat more, denies nausea . She reports not having bowel movement yet, passing gas no distention, and eating oatmeal and fruit, drinking fluids and anticipates she will have movement  She reports problems with neurogenic bladder that started before surgery she does not have the urge to urinate  states she just goes to the bathroom on a regular scheduled time, good amounts out ,she states this is related to compressed nerve prior to surgery, and hopeful for recovery.She discussed possible referral to urology.  She reports using incentive spirometry during the day, she is wearing her brace and increasing walking. He son and daughter are   assisting with her  recovery.  She discussed  any ongoing health issues with elevated cholesterol and hypertension and she is enrolled in  [facility] chronic disease management programs.  She does  not have the hospital indemnity plan, Patient discussed speaking with her surgeon on regarding when okay to receive 2nd Covid Vaccine that is due next week she recommends delay by 2 weeks , she asked for additional information on timing of vaccines.  She  uses a [facility] outpatient pharmacy at [employer] [employer].     Objective:  Hala Ivory  hospitalized at  [facility]  from 3/15-3-6/21 for Lumbar 4-5 Posterior interbody fusion  Comorbidities include: Spinal stenosis, hypercholesterolemia, hypertension  She was discharged to home on 3/16/21without the need for home health services or DME.   Assessment:  Patient voices good understanding of all discharge instructions.  See transition of care flowsheet for assessment details.   Plan:  Reviewed hospital discharge diagnosis of Lumbar 4-5 posterior interbody fusion  and discharge treatment plan using hospital discharge instructions, assessing medication adherence, reviewing problems requiring provider notification, and discussing the importance of follow up with surgeon, primary care provider and/or specialists as directed. Reviewed [facility] healthy lifestyle program information to receive discounted premium for  2022  Step 1: Get annual physical between October 13, 2018 and April 12, 2020; Step 2: Complete your health assessment between October 14, 2019 and June 13, 2020 at [email or web address] Step 3:Identify your current health status and complete the corresponding action step between January 1, and June 13, 2020.   Using [facility] website, verified that patient is an active participate in [place]'s [employer] chronic disease management program.   Send patient per request via her personal email , [place] Frequently asked questions on covid vaccine.   No ongoing care management needs identified so will close case to [place] Management services and route successful outreach letter with [place] Management pamphlet and 24 Hour Nurse Line Magnet to [facility] Management clinical pool to be mailed to patient's home address.  Thanked patient for their services to [facility].  Joylene Draft, RN, BSN  [facility] Management Coordinator  [phone]-  Mobile [phone]- Toll Free  Main Office

## 2020-01-02 ENCOUNTER — Other Ambulatory Visit: Payer: 59

## 2020-01-03 ENCOUNTER — Other Ambulatory Visit: Payer: 59

## 2020-01-20 DIAGNOSIS — I10 Essential (primary) hypertension: Secondary | ICD-10-CM | POA: Diagnosis not present

## 2020-01-20 DIAGNOSIS — Z981 Arthrodesis status: Secondary | ICD-10-CM | POA: Diagnosis not present

## 2020-01-20 DIAGNOSIS — M5136 Other intervertebral disc degeneration, lumbar region: Secondary | ICD-10-CM | POA: Diagnosis not present

## 2020-01-20 DIAGNOSIS — M48062 Spinal stenosis, lumbar region with neurogenic claudication: Secondary | ICD-10-CM | POA: Diagnosis not present

## 2020-01-20 DIAGNOSIS — M47816 Spondylosis without myelopathy or radiculopathy, lumbar region: Secondary | ICD-10-CM | POA: Diagnosis not present

## 2020-01-20 DIAGNOSIS — M4317 Spondylolisthesis, lumbosacral region: Secondary | ICD-10-CM | POA: Diagnosis not present

## 2020-02-01 ENCOUNTER — Encounter: Payer: Self-pay | Admitting: Internal Medicine

## 2020-02-01 ENCOUNTER — Other Ambulatory Visit (INDEPENDENT_AMBULATORY_CARE_PROVIDER_SITE_OTHER): Payer: 59

## 2020-02-01 ENCOUNTER — Other Ambulatory Visit: Payer: Self-pay

## 2020-02-01 DIAGNOSIS — E7800 Pure hypercholesterolemia, unspecified: Secondary | ICD-10-CM

## 2020-02-01 LAB — HEPATIC FUNCTION PANEL
ALT: 14 U/L (ref 0–35)
AST: 16 U/L (ref 0–37)
Albumin: 4.1 g/dL (ref 3.5–5.2)
Alkaline Phosphatase: 71 U/L (ref 39–117)
Bilirubin, Direct: 0.1 mg/dL (ref 0.0–0.3)
Total Bilirubin: 0.8 mg/dL (ref 0.2–1.2)
Total Protein: 6.4 g/dL (ref 6.0–8.3)

## 2020-02-01 LAB — TSH: TSH: 1.38 u[IU]/mL (ref 0.35–4.50)

## 2020-02-15 ENCOUNTER — Ambulatory Visit: Payer: 59 | Attending: Neurosurgery

## 2020-02-15 ENCOUNTER — Other Ambulatory Visit: Payer: Self-pay

## 2020-02-15 DIAGNOSIS — M6281 Muscle weakness (generalized): Secondary | ICD-10-CM | POA: Diagnosis not present

## 2020-02-15 DIAGNOSIS — M62838 Other muscle spasm: Secondary | ICD-10-CM | POA: Diagnosis not present

## 2020-02-15 DIAGNOSIS — M545 Low back pain, unspecified: Secondary | ICD-10-CM

## 2020-02-15 NOTE — Therapy (Signed)
[place] MAIN [facility] SERVICES [address] [place], [place], [address] Phone: [phone]   Fax:  [phone]  Physical Therapy Evaluation  Patient Details  Name: Sara Perry MRN: [id number] Date of Birth: 1961-01-27 Referring Provider (PT): Dr. Jovita Gamma   Encounter Date: 02/15/2020  PT End of Session - 02/15/20 1056    Visit Number  1    Number of Visits  12    Date for PT Re-Evaluation  04/16/20    Authorization Type  [employer]    Authorization Time Period  02/15/20-04/16/20 (returns to work 6/7)    PT Start Time  0939    PT Stop Time  1045    PT Time Calculation (min)  66 min    Activity Tolerance  Patient tolerated treatment well;No increased pain    Behavior During Therapy  WFL for tasks assessed/performed       Past Medical History:  Diagnosis Date  . Complication of anesthesia    pt states she is very sensitive to most drugs, usually needs 1/2 of whatever other people get. She states as a child/teenager she had difficulty waking up after surgery due to the sensitivity that she has to drugs.  . Eczema   . Frequent UTI   . H/O lymphadenopathy    With previous submandibular node removals.  . Hypertension   . Recurrent sinus infections   . Sleep apnea    uses c-pap    Past Surgical History:  Procedure Laterality Date  . COLONOSCOPY WITH PROPOFOL N/A 08/25/2017   Procedure: COLONOSCOPY WITH PROPOFOL;  Surgeon: Lucilla Lame, MD;  Location: [facility] ENDOSCOPY;  Service: Endoscopy;  Laterality: N/A;  . Lymph Node Removal  1984  . NECK SURGERY  1977   H/O Right neck surgery secondary to a benign growth with when patient was 59 or 59 yo  . skin lesion extraction  2013   basal cell - Dr Phillip Heal  . Uterine Polyps Removed  2004    There were no vitals filed for this visit.   Subjective Assessment - 02/15/20 1001    Subjective  Pt has a history of chronic low back pain on going 10 years. About beginning of February, pt began  to have acute exacerbation of BLE radicular sympoms which rapidly progressed to point of impairing her ability to tolerate walking. Pt was having neurogenic bladder symptoms. Pt reports shooting pain in legs with AMB, but denies any frank motor loss in BLE, paresthesias without frank anesthesia. Pt underwent L4/5 fusion on 3/15 c Dr. Sherwood Gambler good outcomes overall. Pt was told to avoid BLT, and inittially 5lb restrictions, more recently a 10lb restriction.    Pertinent History  Has done PT in past for LBP as well as shoulder pain.    Limitations  Lifting    How long can you sit comfortably?  Up to 30 minutes    How long can you stand comfortably?  Less than 45 minutes    How long can you walk comfortably?  Has been walking 2-3x daily for 45-60 minutes (tolerated well)    Currently in Pain?  No/denies   back muscle soreness        [facility] PT Assessment - 02/15/20 0001      Assessment   Medical Diagnosis  s/p Lumbar L4/5 PLIF, decompression    Referring Provider (PT)  Dr. Jovita Gamma    Onset Date/Surgical Date  12/26/19    Hand Dominance  Right  Next MD Visit  --   Early June   Prior Therapy  None since surgery       Precautions   Precautions  Back   No BLT    Required Braces or Orthoses  Other Brace/Splint   10lb lifting restrictoin   Other Brace/Splint  Post op corset; has clearance for other post-market brace   Previously having skin irritation issues     Balance Screen   Has the patient fallen in the past 6 months  No    Has the patient had a decrease in activity level because of a fear of falling?   No    Is the patient reluctant to leave their home because of a fear of falling?   No      Home Social worker  Private residence    Living Arrangements  Children   DTR moving back to college soon   Available Help at Discharge  Family    Type of [place] to enter    Entrance Stairs-Number of Steps  3    [place]  Two level;Able to live on main level with bedroom/bathroom    Alternate Level Stairs-Number of Steps  13    Alternate Level Stairs-Rails  Left      Prior Function   Level of Independence  Independent    Vocation  Full time employment    Vocation Requirements  pull, twisting, reaching, mobilizing patients for imaging    Leisure  walking, resistive machines at gym, exercise classes      Cognition   Overall Cognitive Status  Within Functional Limits for tasks assessed      Observation/Other Assessments   Focus on Therapeutic Outcomes (FOTO)   45 (55% impaired)       Sensation   Light Touch  Not tested   Pt reports to be intact     Coordination   Gross Motor Movements are Fluid and Coordinated  Not tested   pt reports to be intact, no signs of neuromotor dysfunciton     ROM / Strength   AROM / PROM / Strength  PROM      PROM   PROM Assessment Site  Hip    Right/Left Hip  Right;Left   findings consistent with trend toward femoral anterversion   Right Hip External Rotation   41    Right Hip Internal Rotation   49    Left Hip External Rotation   45    Left Hip Internal Rotation   45      Transfers   Five time sit to stand comments   15.17sec    hands free, standard chair     Ambulation/Gait   Ambulation Distance (Feet)  350 Feet   see note for detail   Gait Comments  no pain, no obvious impairment         -AMB 368ft, 32sec for 137ft  -SLS: 30+sec bilat, pain free   AMB 350 for gait assessment, self selected gait speed: -normal symmetry in frontal plane -normal hip/knee/ankle alignment and arm swing -good demonstration of heel strike and toe off without indidcators of weakness.  -pelvis stability is adequate -no vsible antalgia -1.94m/s -Has bilat pelvic internal rotation moment from midstance to toe-off akin to femoral anteversion mediated ligamentous rotational moment. Later supported by hip ROM screening. ~50*IR/40*ER bilat   Objective  measurements completed on examination:  See above findings.       PT Short Term Goals - 02/15/20 1424      PT SHORT TERM GOAL #1   Title  After 4 weeks pt to report full indepdence in HEP for progressing strength and activity tolerance.    Baseline  no HEP    Time  4    Period  Weeks    Status  New    Target Date  03/14/20        PT Long Term Goals - 02/15/20 1425      PT LONG TERM GOAL #1   Title  After 6 weeks pt to report full tolerance of return to FT work, ability to perform all work duties.    Baseline  still on FMLA    Time  6    Period  Weeks    Status  New    Target Date  03/28/20      PT LONG TERM GOAL #2   Title  After 6 weeks pt will report knowledge of return to gym activities congruent with safe loading of lumbar spine.    Baseline  machine based and aerobic programs formerly.    Time  6    Period  Weeks    Status  New    Target Date  03/28/20             Plan - 02/15/20 1414    Clinical Impression Statement  Pt presenting to OPPT s/p Lumbar four-five decompression, PLIF, and arthrodesis. Pt DC to home day after surgery, assistance from DTR who is home from college currently. Pt has been compliant with post-op limitations of avoidance BLT and adjacent lifting restirctions of 5lb then 10lb. Pt has been AMB 2-3x daily as recommended by surgeon, tolerated quite well, and typically totals up to 6 miles daily combined (includes IADL) per her actiivty tracker. Pt works full time in radiology at [employer], has taken 3 months FMLA and plans to return to work FT on June 7. Pt foremerly very active at baseline is anxious to return to physical activity for leisure and socialization. Examination this date reveals limitations imposed mostly by postop precautions, as well as operative site soreness and adjacent/intermittent spams in the lower thoracic paraspinals. Pt has no evidence of gross neurological comromise of motor systems or sensory system in BLE. Pt demonstrates  limitations in comfort to prolonged sitting and standing, but has bene able to limit discomfort throughout movement and not needed pain meds. Pt will benefit from skilled PT intervention to address impairment and limitation outlined in this evlauation to gently return to PLOF in work, leisure, and exercise, on a scheduled timeline deemed appropriate by referring physician.    Personal Factors and Comorbidities  Age;Education;Behavior Pattern;Fitness;Past/Current Experience;Profession;Time since onset of injury/illness/exacerbation    Examination-Activity Limitations  Squat;Carry;Continence;Stairs;Stand;Transfers    Examination-Participation Restrictions  Driving;Community Activity;Cleaning;Laundry;Interpersonal Relationship    Stability/Clinical Decision Making  Stable/Uncomplicated    Clinical Decision Making  Low    Clinical Presentation due to:  Improved neurological symptoms since surgery    Rehab Potential  Excellent    Clinical Impairments Affecting Rehab Potential  (+) highly active (+) fitness    PT Frequency  2x / week    PT Duration  6 weeks    PT Treatment/Interventions  Manual techniques;Neuromuscular re-education;Patient/family education;Therapeutic exercise;Therapeutic activities;Iontophoresis 4mg /ml Dexamethasone;Moist Heat;Cryotherapy;Electrical Stimulation;Ultrasound;Passive range of motion;Dry needling;Joint Manipulations;Scar mobilization    PT Next Visit Plan  Review HEP and expand as needed; detailed MMT if  desired    PT Home Exercise Plan  See education section    Consulted and Agree with Plan of Care  Patient       Patient will benefit from skilled therapeutic intervention in order to improve the following deficits and impairments:  Decreased range of motion, Decreased endurance, Decreased strength, Decreased balance, Pain, Decreased coordination, Decreased mobility, Decreased activity tolerance, Increased fascial restricitons  Visit Diagnosis: Acute midline low back pain  without sciatica  Muscle weakness (generalized)     Problem List Patient Active Problem List   Diagnosis Date Noted  . Lumbar stenosis with neurogenic claudication 12/26/2019  . Hyperbilirubinemia 12/14/2019  . Eye injury 05/26/2019  . Left shoulder pain 05/23/2018  . Snoring 05/23/2018  . Benign neoplasm of descending colon   . Benign neoplasm of ascending colon   . Vaginal atrophy 03/22/2017  . Meniere's disease 08/17/2016  . Colon cancer screening 08/11/2015  . Fatigue 06/03/2015  . Health care maintenance 06/03/2015  . Neck pain 02/05/2014  . Hypercholesterolemia 02/05/2014  . Back pain 12/05/2012  . Trigger finger 12/05/2012  . Essential hypertension, benign 12/05/2012   2:29 PM, 02/15/20 Etta Grandchild, PT, DPT Physical Therapist - [facility]  Outpatient Physical [place] [phone]     Etta Grandchild 02/15/2020, 2:28 PM  [place] MAIN [facility] SERVICES [address] [place], [place], [address] Phone: [phone]   Fax:  [phone]  Name: Jawana Whitescarver MRN: [id number] Date of Birth: 15-Dec-1960

## 2020-02-20 ENCOUNTER — Ambulatory Visit: Payer: 59 | Admitting: Physical Therapy

## 2020-02-20 ENCOUNTER — Encounter: Payer: Self-pay | Admitting: Physical Therapy

## 2020-02-20 ENCOUNTER — Other Ambulatory Visit: Payer: Self-pay

## 2020-02-20 DIAGNOSIS — M6281 Muscle weakness (generalized): Secondary | ICD-10-CM

## 2020-02-20 DIAGNOSIS — M62838 Other muscle spasm: Secondary | ICD-10-CM | POA: Diagnosis not present

## 2020-02-20 DIAGNOSIS — M545 Low back pain, unspecified: Secondary | ICD-10-CM

## 2020-02-20 NOTE — Patient Instructions (Signed)
Access Code: [email or web address] URL: [email or web address] Date: 02/20/2020 Prepared by: Blanche East  Exercises Supine Posterior Pelvic Tilt with [place]nee Rocks - 1 x daily - 7 x weekly - 1 sets - 10 reps Supine March - 1 x daily - 7 x weekly - 1 sets - 10 reps Dead Bug Alternating Arm Extension - 1 x daily - 7 x weekly - 1 sets - 10 reps Dead Bug - 1 x daily - 7 x weekly - 1 sets - 10 reps

## 2020-02-20 NOTE — Therapy (Signed)
[place] MAIN [facility] SERVICES [address] [place], [place], [address] Phone: [phone]   Fax:  [phone]  Physical Therapy Treatment  Patient Details  Name: Sara Perry MRN: [id number] Date of Birth: 1961-01-24 Referring Provider (PT): Dr. Jovita Gamma   Encounter Date: 02/20/2020  PT End of Session - 02/21/20 0925    Visit Number  2    Number of Visits  12    Date for PT Re-Evaluation  04/16/20    Authorization Type  [employer]    Authorization Time Period  02/15/20-04/16/20 (returns to work 6/7)    PT Start Time  1017    PT Stop Time  1100    PT Time Calculation (min)  43 min    Activity Tolerance  Patient tolerated treatment well;No increased pain    Behavior During Therapy  WFL for tasks assessed/performed       Past Medical History:  Diagnosis Date  . Complication of anesthesia    pt states she is very sensitive to most drugs, usually needs 1/2 of whatever other people get. She states as a child/teenager she had difficulty waking up after surgery due to the sensitivity that she has to drugs.  . Eczema   . Frequent UTI   . H/O lymphadenopathy    With previous submandibular node removals.  . Hypertension   . Recurrent sinus infections   . Sleep apnea    uses c-pap    Past Surgical History:  Procedure Laterality Date  . COLONOSCOPY WITH PROPOFOL N/A 08/25/2017   Procedure: COLONOSCOPY WITH PROPOFOL;  Surgeon: Lucilla Lame, MD;  Location: [facility] ENDOSCOPY;  Service: Endoscopy;  Laterality: N/A;  . Lymph Node Removal  1984  . NECK SURGERY  1977   H/O Right neck surgery secondary to a benign growth with when patient was 32 or 59 yo  . skin lesion extraction  2013   basal cell - Dr Phillip Heal  . Uterine Polyps Removed  2004    There were no vitals filed for this visit.  Subjective Assessment - 02/20/20 1029    Subjective  Patient reports doing well. She reports mild ache today. She denies any LE pain but is experiencing  weakness in BLE.    Pertinent History  Has done PT in past for LBP as well as shoulder pain.    Limitations  Lifting    How long can you sit comfortably?  Up to 30 minutes    How long can you stand comfortably?  Less than 45 minutes    How long can you walk comfortably?  Has been walking 2-3x daily for 45-60 minutes (tolerated well)    Currently in Pain?  Yes    Pain Score  1     Pain Location  Back    Pain Orientation  Lower    Pain Descriptors / Indicators  Aching;Sore    Pain Type  Chronic pain    Pain Onset  More than a month ago    Pain Frequency  Intermittent    Aggravating Factors   certain movements    Pain Relieving Factors  rest    Effect of Pain on Daily Activities  decreased activity tolerance;    Multiple Pain Sites  No         TREATMENT: Warm up on Nustep BLE only level 2 x3 min (Unbilled); reports slight increase discomfort in sitting position requiring towel roll for comfort.    Hooklying: Posterior pelvic rock x10  reps; Abdominal contraction:  Alternate march x10 reps each LE  Alternate UE lift x10 reps each  Combo UE/LE lift x10 reps each side (dead bug) Patient required min-moderate verbal/tactile cues for correct exercise technique and to increase core abdominal stabilization with UE/LE movement  Provided written HEP for better adherence  Patient able to exhibit good log roll technique and ability to transition sit<>hooklying; educated patient that these exercises are safe to do in the bed rather than on the floor for better tolerance;  Following exercise: Educated patient in proper positioning/posture with various standing ADLs  Provided written handout with proper posture and tips for sitting/standing posture with ADLs and work tasks  Educated patient in standing hip flexion with cues to increase core stabilization and keep back flat for better support at low back x5 reps Instructed patient to slowly start reintroducing lumbar ROM in single plane  direction for better tolerance and less back pain;  Patient verbalized understanding  Tolerated session well. Patient reports no significant increase in back pain but does report moderate fatigue at end of session;                       PT Education - 02/20/20 1031    Education Details  HEP/body mechanics    Person(s) Educated  Patient    Methods  Explanation;Verbal cues    Comprehension  Verbalized understanding;Returned demonstration;Verbal cues required;Need further instruction       PT Short Term Goals - 02/15/20 1424      PT SHORT TERM GOAL #1   Title  After 4 weeks pt to report full indepdence in HEP for progressing strength and activity tolerance.    Baseline  no HEP    Time  4    Period  Weeks    Status  New    Target Date  03/14/20        PT Long Term Goals - 02/15/20 1425      PT LONG TERM GOAL #1   Title  After 6 weeks pt to report full tolerance of return to FT work, ability to perform all work duties.    Baseline  still on FMLA    Time  6    Period  Weeks    Status  New    Target Date  03/28/20      PT LONG TERM GOAL #2   Title  After 6 weeks pt will report knowledge of return to gym activities congruent with safe loading of lumbar spine.    Baseline  machine based and aerobic programs formerly.    Time  6    Period  Weeks    Status  New    Target Date  03/28/20            Plan - 02/21/20 [id number]    Clinical Impression Statement  Patient presents to therapy motivated and participated well. Instructed patient in basic core strengthening exercise in hooklying position. Patient able to isolate abdominal contraction well with good stabilization noted. Educated patient on importance of not over doing it and focusing on basic exercise and slowly reintroducing core strengthening to reduce soreness and reduce inflammation. Patient verbalized understanding. educated patient in proper body mechanics with various standing activities to reduce  risk of reinjury. Patient would benefit from additional skilled PT intervention to improve core/back strength for better trunk control with ADLs and work tasks and to reduce back pain;    Personal Factors and Comorbidities  Age;Education;Behavior  Pattern;Fitness;Past/Current Experience;Profession;Time since onset of injury/illness/exacerbation    Examination-Activity Limitations  Squat;Carry;Continence;Stairs;Stand;Transfers    Examination-Participation Restrictions  Driving;Community Activity;Cleaning;Laundry;Interpersonal Relationship    Stability/Clinical Decision Making  Stable/Uncomplicated    Rehab Potential  Excellent    Clinical Impairments Affecting Rehab Potential  (+) highly active (+) fitness    PT Frequency  2x / week    PT Duration  6 weeks    PT Treatment/Interventions  Manual techniques;Neuromuscular re-education;Patient/family education;Therapeutic exercise;Therapeutic activities;Iontophoresis 4mg /ml Dexamethasone;Moist Heat;Cryotherapy;Electrical Stimulation;Ultrasound;Passive range of motion;Dry needling;Joint Manipulations;Scar mobilization    PT Next Visit Plan  Review HEP and expand as needed; detailed MMT if desired    PT Home Exercise Plan  See education section    Consulted and Agree with Plan of Care  Patient       Patient will benefit from skilled therapeutic intervention in order to improve the following deficits and impairments:  Decreased range of motion, Decreased endurance, Decreased strength, Decreased balance, Pain, Decreased coordination, Decreased mobility, Decreased activity tolerance, Increased fascial restricitons  Visit Diagnosis: Acute midline low back pain without sciatica  Muscle weakness (generalized)  Other muscle spasm     Problem List Patient Active Problem List   Diagnosis Date Noted  . Lumbar stenosis with neurogenic claudication 12/26/2019  . Hyperbilirubinemia 12/14/2019  . Eye injury 05/26/2019  . Left shoulder pain 05/23/2018  .  Snoring 05/23/2018  . Benign neoplasm of descending colon   . Benign neoplasm of ascending colon   . Vaginal atrophy 03/22/2017  . Meniere's disease 08/17/2016  . Colon cancer screening 08/11/2015  . Fatigue 06/03/2015  . Health care maintenance 06/03/2015  . Neck pain 02/05/2014  . Hypercholesterolemia 02/05/2014  . Back pain 12/05/2012  . Trigger finger 12/05/2012  . Essential hypertension, benign 12/05/2012    Kaedon Fanelli PT, DPT 02/21/2020, 9:29 AM  [place] MAIN [facility] SERVICES [address] [place], [place], [address] Phone: [phone]   Fax:  [phone]  Name: Sara Perry MRN: [id number] Date of Birth: Jan 20, 1961

## 2020-02-22 ENCOUNTER — Encounter: Payer: Self-pay | Admitting: Physical Therapy

## 2020-02-22 ENCOUNTER — Other Ambulatory Visit: Payer: Self-pay

## 2020-02-22 ENCOUNTER — Ambulatory Visit: Payer: 59 | Admitting: Physical Therapy

## 2020-02-22 DIAGNOSIS — M545 Low back pain, unspecified: Secondary | ICD-10-CM

## 2020-02-22 DIAGNOSIS — M62838 Other muscle spasm: Secondary | ICD-10-CM | POA: Diagnosis not present

## 2020-02-22 DIAGNOSIS — M6281 Muscle weakness (generalized): Secondary | ICD-10-CM

## 2020-02-22 NOTE — Therapy (Signed)
[place] MAIN [facility] SERVICES [address] [place], [place], [address] Phone: [phone]   Fax:  [phone]  Physical Therapy Treatment  Patient Details  Name: Sara Perry MRN: [id number] Date of Birth: [birth year] Referring Provider (PT): Dr. Jovita Gamma   Encounter Date: 02/22/2020  PT End of Session - 02/22/20 1024    Visit Number  3    Number of Visits  12    Date for PT Re-Evaluation  04/16/20    Authorization Type  [employer]    Authorization Time Period  02/15/20-04/16/20 (returns to work 6/7)    PT Start Time  0936    PT Stop Time  1020    PT Time Calculation (min)  44 min    Activity Tolerance  Patient tolerated treatment well;No increased pain    Behavior During Therapy  WFL for tasks assessed/performed       Past Medical History:  Diagnosis Date  . Complication of anesthesia    pt states she is very sensitive to most drugs, usually needs 1/2 of whatever other people get. She states as a child/teenager she had difficulty waking up after surgery due to the sensitivity that she has to drugs.  . Eczema   . Frequent UTI   . H/O lymphadenopathy    With previous submandibular node removals.  . Hypertension   . Recurrent sinus infections   . Sleep apnea    uses c-pap    Past Surgical History:  Procedure Laterality Date  . COLONOSCOPY WITH PROPOFOL N/A 08/25/2017   Procedure: COLONOSCOPY WITH PROPOFOL;  Surgeon: Lucilla Lame, MD;  Location: [facility] ENDOSCOPY;  Service: Endoscopy;  Laterality: N/A;  . Lymph Node Removal  1984  . NECK SURGERY  1977   H/O Right neck surgery secondary to a benign growth with when patient was 59 or 59 yo  . skin lesion extraction  2013   basal cell - Dr Phillip Heal  . Uterine Polyps Removed  2004    There were no vitals filed for this visit.  Subjective Assessment - 02/22/20 1023    Subjective  Patient reports doing well. She denies any increase in pain with exercises after last session. She  reports adherence with HEP;    Pertinent History  Has done PT in past for LBP as well as shoulder pain.    Limitations  Lifting    How long can you sit comfortably?  Up to 30 minutes    How long can you stand comfortably?  Less than 45 minutes    How long can you walk comfortably?  Has been walking 2-3x daily for 45-60 minutes (tolerated well)    Currently in Pain?  Yes    Pain Score  2     Pain Location  Back    Pain Orientation  Lower    Pain Descriptors / Indicators  Aching;Sore    Pain Type  Chronic pain    Pain Onset  More than a month ago    Pain Frequency  Intermittent    Aggravating Factors   certain movements    Pain Relieving Factors  walking    Effect of Pain on Daily Activities  guarded activity tolerance;    Multiple Pain Sites  No         TREATMENT: Educated patient in ways to increase activity level including using pool at gym and safe weight machines to use; Patient verbalized understanding;   Hooklying: Single knee to chest stretch 20  sec hold x2 reps each LE Hamstring stretch neutral stretch with hip flexion and knee flexion/extension 5 sec hold x5 reps;  Posterior pelvic rocks x10 reps with good control; Bridges with arms by side x10 reps, patient able to exhibit good form and motor control;  Posterior pelvic rock with suspended heel slides x10 reps each LE; patient had increased difficult on right side; good core stabilization noted;   Patient seated Educated patient in seated hamstring stretch with LE extended; Patient exhibits good posture with good positioning;  Instructed patient in importance of hamstring stretches to reduce tightness with increased walking  Tolerated session well. Patient is able to progress core and LE strengthening well. She is able to exhibit good postural control and core stabilization with LE movement;  Plan to assess lumbar joint mobility above/below fusion for better spinal mobility;                     PT  Education - 02/22/20 1023    Education Details  HEP/body mechanics;    Person(s) Educated  Patient    Methods  Explanation;Verbal cues    Comprehension  Verbalized understanding;Returned demonstration;Verbal cues required;Need further instruction       PT Short Term Goals - 02/15/20 1424      PT SHORT TERM GOAL #1   Title  After 4 weeks pt to report full indepdence in HEP for progressing strength and activity tolerance.    Baseline  no HEP    Time  4    Period  Weeks    Status  New    Target Date  03/14/20        PT Long Term Goals - 02/15/20 1425      PT LONG TERM GOAL #1   Title  After 6 weeks pt to report full tolerance of return to FT work, ability to perform all work duties.    Baseline  still on FMLA    Time  6    Period  Weeks    Status  New    Target Date  03/28/20      PT LONG TERM GOAL #2   Title  After 6 weeks pt will report knowledge of return to gym activities congruent with safe loading of lumbar spine.    Baseline  machine based and aerobic programs formerly.    Time  6    Period  Weeks    Status  New    Target Date  03/28/20            Plan - 02/22/20 1024    Clinical Impression Statement  Patient motivated and tolerated session well. Progressed core strengthening to tolerance. Patient able to exhibit good body mechanics and positioning with advanced exercise. Educated patient in safe gym exercises to do to resume gentle resisted training; Also educated patient in aquatic exercise to help improve mobility. Patient verbalized understanding. She would benefit from additional skilled PT intervention to improve strength and mobility while reducing back pain;    Personal Factors and Comorbidities  Age;Education;Behavior Pattern;Fitness;Past/Current Experience;Profession;Time since onset of injury/illness/exacerbation    Examination-Activity Limitations  Squat;Carry;Continence;Stairs;Stand;Transfers    Examination-Participation Restrictions   Driving;Community Activity;Cleaning;Laundry;Interpersonal Relationship    Stability/Clinical Decision Making  Stable/Uncomplicated    Rehab Potential  Excellent    Clinical Impairments Affecting Rehab Potential  (+) highly active (+) fitness    PT Frequency  2x / week    PT Duration  6 weeks    PT Treatment/Interventions  Manual techniques;Neuromuscular re-education;Patient/family education;Therapeutic exercise;Therapeutic activities;Iontophoresis 4mg /ml Dexamethasone;Moist Heat;Cryotherapy;Electrical Stimulation;Ultrasound;Passive range of motion;Dry needling;Joint Manipulations;Scar mobilization    PT Next Visit Plan  Review HEP and expand as needed; detailed MMT if desired    PT Home Exercise Plan  See education section    Consulted and Agree with Plan of Care  Patient       Patient will benefit from skilled therapeutic intervention in order to improve the following deficits and impairments:  Decreased range of motion, Decreased endurance, Decreased strength, Decreased balance, Pain, Decreased coordination, Decreased mobility, Decreased activity tolerance, Increased fascial restricitons  Visit Diagnosis: Acute midline low back pain without sciatica  Muscle weakness (generalized)  Other muscle spasm     Problem List Patient Active Problem List   Diagnosis Date Noted  . Lumbar stenosis with neurogenic claudication 12/26/2019  . Hyperbilirubinemia 12/14/2019  . Eye injury 05/26/2019  . Left shoulder pain 05/23/2018  . Snoring 05/23/2018  . Benign neoplasm of descending colon   . Benign neoplasm of ascending colon   . Vaginal atrophy 03/22/2017  . Meniere's disease 08/17/2016  . Colon cancer screening 08/11/2015  . Fatigue 06/03/2015  . Health care maintenance 06/03/2015  . Neck pain 02/05/2014  . Hypercholesterolemia 02/05/2014  . Back pain 12/05/2012  . Trigger finger 12/05/2012  . Essential hypertension, benign 12/05/2012    Sabah Zucco PT, DPT 02/22/2020, 10:27  AM  [place] MAIN [facility] SERVICES [address] [place], [place], [address] Phone: [phone]   Fax:  [phone]  Name: Ashawnti Klauck MRN: [id number] Date of Birth: 1960/10/25

## 2020-02-27 ENCOUNTER — Ambulatory Visit: Payer: 59 | Admitting: Physical Therapy

## 2020-02-27 ENCOUNTER — Encounter: Payer: Self-pay | Admitting: Physical Therapy

## 2020-02-27 ENCOUNTER — Other Ambulatory Visit: Payer: Self-pay

## 2020-02-27 DIAGNOSIS — M545 Low back pain, unspecified: Secondary | ICD-10-CM

## 2020-02-27 DIAGNOSIS — M6281 Muscle weakness (generalized): Secondary | ICD-10-CM | POA: Diagnosis not present

## 2020-02-27 DIAGNOSIS — M62838 Other muscle spasm: Secondary | ICD-10-CM

## 2020-02-27 NOTE — Therapy (Signed)
[place] MAIN [facility] SERVICES [address] [place], [place], [address] Phone: [phone]   Fax:  [phone]  Physical Therapy Treatment  Patient Details  Name: Sara Perry MRN: [id number] Date of Birth: 1961/02/07 Referring Provider (PT): Dr. Jovita Gamma   Encounter Date: 02/27/2020  PT End of Session - 02/27/20 1112    Visit Number  4    Number of Visits  12    Date for PT Re-Evaluation  04/16/20    Authorization Type  [employer]    Authorization Time Period  02/15/20-04/16/20 (returns to work 6/7)    PT Start Time  0802    PT Stop Time  0845    PT Time Calculation (min)  43 min    Activity Tolerance  Patient tolerated treatment well;No increased pain    Behavior During Therapy  WFL for tasks assessed/performed       Past Medical History:  Diagnosis Date  . Complication of anesthesia    pt states she is very sensitive to most drugs, usually needs 1/2 of whatever other people get. She states as a child/teenager she had difficulty waking up after surgery due to the sensitivity that she has to drugs.  . Eczema   . Frequent UTI   . H/O lymphadenopathy    With previous submandibular node removals.  . Hypertension   . Recurrent sinus infections   . Sleep apnea    uses c-pap    Past Surgical History:  Procedure Laterality Date  . COLONOSCOPY WITH PROPOFOL N/A 08/25/2017   Procedure: COLONOSCOPY WITH PROPOFOL;  Surgeon: Lucilla Lame, MD;  Location: [facility] ENDOSCOPY;  Service: Endoscopy;  Laterality: N/A;  . Lymph Node Removal  1984  . NECK SURGERY  1977   H/O Right neck surgery secondary to a benign growth with when patient was 20 or 59 yo  . skin lesion extraction  2013   basal cell - Dr Phillip Heal  . Uterine Polyps Removed  2004    There were no vitals filed for this visit.  Subjective Assessment - 02/27/20 0807    Subjective  Patient reports feeling a little sore this morning, particularly in posterior hips. She reports  going to the pool and stated that helped.    Pertinent History  Has done PT in past for LBP as well as shoulder pain.    Limitations  Lifting    How long can you sit comfortably?  Up to 30 minutes    How long can you stand comfortably?  Less than 45 minutes    How long can you walk comfortably?  Has been walking 2-3x daily for 45-60 minutes (tolerated well)    Currently in Pain?  Yes    Pain Score  2     Pain Location  Hip    Pain Orientation  Right;Left    Pain Descriptors / Indicators  Aching;Sore;Tender    Pain Type  Chronic pain    Pain Radiating Towards  R>L    Pain Onset  More than a month ago    Pain Frequency  Intermittent    Aggravating Factors   certain movement    Pain Relieving Factors  walking    Effect of Pain on Daily Activities  guarded activity;    Multiple Pain Sites  No           TREATMENT:  Patient prone:  PT assessed lumbar spinal movement at L1, L2, L5 PA mobs, grade I-II, 10 sec bouts  x2 sets each; Assessed sacral movement with grade II PA mobs at proximal sacrum 10 sec bouts x2 sets Patient exhibits hypomobility at all levels. Reports mild soreness/tenderness with palpation but no radicular symptoms;   PT performed PA mobs at right SI joint x15 sec with good mobility noted; She does have mild tightness in myofascia/soft tissue along right SI joint;    Prone: Alternate hamstring curl x10 reps each LE;  Gluteal max hip extension x10 reps each LE Posterior pelvic rock 5 sec hold 2x5 reps for lumbar stretch to help reduce stiffness Alternate hip extension x10 reps each LE with mod VCS for core stabilization and to avoid hip rotation for better trunk stabilization;  Alternate UE/LE lift with min VCS for proper positioning to avoid trunk rotation x10 reps;  Patient able to exhibit good multifidi activation with minimal trunk rotation;    Qped: Childs pose 30 sec hold x3 reps with min VCs for proper positioning. Patient able to tolerate well with good  control;  Cat/camel stretch 10 sec hold x3 reps each direction with good control and positioning;     Response to treatment:tolerated session well. She denies any increase in pain at end of session. Patient able to exhibit good positioning and muscle activation with advanced exercise. She does fatigue quickly being able to tolerate limited repetition due to weakness.  Reinforced HEP, patient verbalized understanding;                 PT Education - 02/27/20 1112    Education Details  lumbar strengthening, positioning, HEP    Person(s) Educated  Patient    Methods  Explanation;Verbal cues    Comprehension  Verbalized understanding;Returned demonstration;Verbal cues required;Need further instruction       PT Short Term Goals - 02/15/20 1424      PT SHORT TERM GOAL #1   Title  After 4 weeks pt to report full indepdence in HEP for progressing strength and activity tolerance.    Baseline  no HEP    Time  4    Period  Weeks    Status  New    Target Date  03/14/20        PT Long Term Goals - 02/15/20 1425      PT LONG TERM GOAL #1   Title  After 6 weeks pt to report full tolerance of return to FT work, ability to perform all work duties.    Baseline  still on FMLA    Time  6    Period  Weeks    Status  New    Target Date  03/28/20      PT LONG TERM GOAL #2   Title  After 6 weeks pt will report knowledge of return to gym activities congruent with safe loading of lumbar spine.    Baseline  machine based and aerobic programs formerly.    Time  6    Period  Weeks    Status  New    Target Date  03/28/20            Plan - 02/27/20 1112    Clinical Impression Statement  Patient motivated and participated well within session. PT performed grade I-II PA joint mobs above and below fusion to improve joint mobility and reduce soreness. Patient does exhibit hypomobility throughout lumbar spine. She exhibits mild tightness in right myofascia along SI joint.Patient is  able to exhibit good multifidi activation with LE hip extension. Patient instructed in lumbar strengthening  with UE/LE lift. She does require min VCs for proper exercise technique/positioning to avoid trunk rotation for better stability. Patient tolerated session well. She denies any severe pain at end of session. reinforced HEP and recommended patient take it easy this afternoon to reduce delayed onset muscle soreness. She would benefit from additional skilled PT intervention to improve strength and mobility for return to PLOF.    Personal Factors and Comorbidities  Age;Education;Behavior Pattern;Fitness;Past/Current Experience;Profession;Time since onset of injury/illness/exacerbation    Examination-Activity Limitations  Squat;Carry;Continence;Stairs;Stand;Transfers    Examination-Participation Restrictions  Driving;Community Activity;Cleaning;Laundry;Interpersonal Relationship    Stability/Clinical Decision Making  Stable/Uncomplicated    Rehab Potential  Excellent    Clinical Impairments Affecting Rehab Potential  (+) highly active (+) fitness    PT Frequency  2x / week    PT Duration  6 weeks    PT Treatment/Interventions  Manual techniques;Neuromuscular re-education;Patient/family education;Therapeutic exercise;Therapeutic activities;Iontophoresis 4mg /ml Dexamethasone;Moist Heat;Cryotherapy;Electrical Stimulation;Ultrasound;Passive range of motion;Dry needling;Joint Manipulations;Scar mobilization    PT Next Visit Plan  Review HEP and expand as needed; detailed MMT if desired    PT Home Exercise Plan  See education section    Consulted and Agree with Plan of Care  Patient       Patient will benefit from skilled therapeutic intervention in order to improve the following deficits and impairments:  Decreased range of motion, Decreased endurance, Decreased strength, Decreased balance, Pain, Decreased coordination, Decreased mobility, Decreased activity tolerance, Increased fascial  restricitons  Visit Diagnosis: Acute midline low back pain without sciatica  Muscle weakness (generalized)  Other muscle spasm     Problem List Patient Active Problem List   Diagnosis Date Noted  . Lumbar stenosis with neurogenic claudication 12/26/2019  . Hyperbilirubinemia 12/14/2019  . Eye injury 05/26/2019  . Left shoulder pain 05/23/2018  . Snoring 05/23/2018  . Benign neoplasm of descending colon   . Benign neoplasm of ascending colon   . Vaginal atrophy 03/22/2017  . Meniere's disease 08/17/2016  . Colon cancer screening 08/11/2015  . Fatigue 06/03/2015  . Health care maintenance 06/03/2015  . Neck pain 02/05/2014  . Hypercholesterolemia 02/05/2014  . Back pain 12/05/2012  . Trigger finger 12/05/2012  . Essential hypertension, benign 12/05/2012    Kaiyla Stahly PT, DPT 02/27/2020, 11:20 AM  [place] MAIN [facility] SERVICES [address] [place], [place], [address] Phone: [phone]   Fax:  [phone]  Name: Sara Perry MRN: [id number] Date of Birth: 26-Feb-1961

## 2020-02-29 ENCOUNTER — Other Ambulatory Visit: Payer: Self-pay

## 2020-02-29 ENCOUNTER — Encounter: Payer: Self-pay | Admitting: Physical Therapy

## 2020-02-29 ENCOUNTER — Ambulatory Visit: Payer: 59 | Admitting: Physical Therapy

## 2020-02-29 DIAGNOSIS — M545 Low back pain, unspecified: Secondary | ICD-10-CM

## 2020-02-29 DIAGNOSIS — M6281 Muscle weakness (generalized): Secondary | ICD-10-CM | POA: Diagnosis not present

## 2020-02-29 DIAGNOSIS — M62838 Other muscle spasm: Secondary | ICD-10-CM

## 2020-02-29 NOTE — Patient Instructions (Signed)
Access Code: [email or web address] URL: [email or web address] Date: 02/29/2020 Prepared by: Blanche East  Exercises Prone Knee Flexion - 1 x daily - 7 x weekly - 2 sets - 10 reps Prone Hip Extension - 1 x daily - 7 x weekly - 1 sets - 10 reps Prone Alternating Arm and Leg Lifts - 1 x daily - 7 x weekly - 2 sets - 5 reps Child's Pose Stretch - 1 x daily - 7 x weekly - 1 sets - 3 reps - 20 sec hold Cat-Camel - 1 x daily - 7 x weekly - 1 sets - 5 reps - 5 sec hold hold

## 2020-02-29 NOTE — Therapy (Signed)
[place] [facility] SERVICES [address] [place], [place], [address] Phone: [phone]   Fax:  [phone]  Physical Therapy Treatment  Patient Details  Name: Sara Perry MRN: [id number] Date of Birth: 1960/12/11 Referring Provider (PT): Dr. Jovita Gamma   Encounter Date: 02/29/2020  PT End of Session - 02/29/20 0809    Visit Number  5    Number of Visits  12    Date for PT Re-Evaluation  04/16/20    Authorization Type  [employer]    Authorization Time Period  02/15/20-04/16/20 (returns to work 6/7)    PT Start Time  0802    PT Stop Time  0845    PT Time Calculation (min)  43 min    Activity Tolerance  Patient tolerated treatment well;No increased pain    Behavior During Therapy  WFL for tasks assessed/performed       Past Medical History:  Diagnosis Date  . Complication of anesthesia    pt states she is very sensitive to most drugs, usually needs 1/2 of whatever other people get. She states as a child/teenager she had difficulty waking up after surgery due to the sensitivity that she has to drugs.  . Eczema   . Frequent UTI   . H/O lymphadenopathy    With previous submandibular node removals.  . Hypertension   . Recurrent sinus infections   . Sleep apnea    uses c-pap    Past Surgical History:  Procedure Laterality Date  . COLONOSCOPY WITH PROPOFOL N/A 08/25/2017   Procedure: COLONOSCOPY WITH PROPOFOL;  Surgeon: Lucilla Lame, MD;  Location: [facility] ENDOSCOPY;  Service: Endoscopy;  Laterality: N/A;  . Lymph Node Removal  1984  . NECK SURGERY  1977   H/O Right neck surgery secondary to a benign growth with when patient was 74 or 59 yo  . skin lesion extraction  2013   basal cell - Dr Phillip Heal  . Uterine Polyps Removed  2004    There were no vitals filed for this visit.  Subjective Assessment - 02/29/20 0807    Subjective  Patient reports feeling a little sore this morning on right lumbar paraspinals. She was sore and  fatigued after last session;    Pertinent History  Has done PT in past for LBP as well as shoulder pain.    Limitations  Lifting    How long can you sit comfortably?  Up to 30 minutes    How long can you stand comfortably?  Less than 45 minutes    How long can you walk comfortably?  Has been walking 2-3x daily for 45-60 minutes (tolerated well)    Currently in Pain?  Yes    Pain Score  2     Pain Location  Back    Pain Orientation  Right    Pain Descriptors / Indicators  Aching;Sore    Pain Type  Chronic pain    Pain Onset  More than a month ago    Pain Frequency  Intermittent    Aggravating Factors   certain movement    Pain Relieving Factors  walking    Effect of Pain on Daily Activities  guarded activity;    Multiple Pain Sites  No         TREATMENT: Patient hooklying with moist heat to low back: Posterior pelvic rock x10 reps; Single knee to chest stretch 20 sec hold x2 reps each LE;  Lumbar trunk rotation x5 reps each  direction with min VCs to avoid painful ROM;  Bridges with arms to side x15 reps; Pt able to exhibit good positioning and motor control;    Patient prone: Alternate hamstring curl x10 reps each LE;  Alternate hip extension x10 reps each LE with mod VCS for core stabilization and to avoid hip rotation for better trunk stabilization;  Alternate UE/LE lift with min VCS for proper positioning to avoid trunk rotation 2x5 reps;  Patient able to exhibit good multifidi activation with minimal trunk rotation;   Qped: Childs pose 30 sec hold x3 reps with min VCs for proper positioning. Patient able to tolerate well with good control;  Cat/camel stretch 10 sec hold x5 reps each direction with good control and positioning;    Response to treatment:tolerated session well. She denies any increase in pain at end of session. Patient able to exhibit good positioning and muscle activation with advanced exercise. She does fatigue quickly being able to tolerate limited  repetition due to weakness.  Advanced HEP, patient verbalized understanding;                          PT Education - 02/29/20 0808    Education Details  lumbar strengthening/positioning, HEP    Person(s) Educated  Patient    Methods  Explanation;Verbal cues    Comprehension  Verbalized understanding;Returned demonstration;Verbal cues required;Need further instruction       PT Short Term Goals - 02/15/20 1424      PT SHORT TERM GOAL #1   Title  After 4 weeks pt to report full indepdence in HEP for progressing strength and activity tolerance.    Baseline  no HEP    Time  4    Period  Weeks    Status  New    Target Date  03/14/20        PT Long Term Goals - 02/15/20 1425      PT LONG TERM GOAL #1   Title  After 6 weeks pt to report full tolerance of return to FT work, ability to perform all work duties.    Baseline  still on FMLA    Time  6    Period  Weeks    Status  New    Target Date  03/28/20      PT LONG TERM GOAL #2   Title  After 6 weeks pt will report knowledge of return to gym activities congruent with safe loading of lumbar spine.    Baseline  machine based and aerobic programs formerly.    Time  6    Period  Weeks    Status  New    Target Date  03/28/20            Plan - 02/29/20 0824    Clinical Impression Statement  Patient motivated and participated well within session. Instructed patient in lumbar flexion exercise to stretch low back and then initiated prone lumbar extension exercise for stabilization; She does require min VCs for proper positioning and exercise technique. Patient is progressing well with improved lumbar ROM and good motor control. She does continue to fatigue quickly due to weakness in core and lumbar paraspinals. She would benefit from additional skilled PT intervention to improve strength, mobility and reduce pain with ADLs.    Personal Factors and Comorbidities  Age;Education;Behavior  Pattern;Fitness;Past/Current Experience;Profession;Time since onset of injury/illness/exacerbation    Examination-Activity Limitations  Squat;Carry;Continence;Stairs;Stand;Transfers    Examination-Participation Restrictions  Driving;Community Activity;Cleaning;Laundry;Interpersonal Relationship  Stability/Clinical Decision Making  Stable/Uncomplicated    Rehab Potential  Excellent    Clinical Impairments Affecting Rehab Potential  (+) highly active (+) fitness    PT Frequency  2x / week    PT Duration  6 weeks    PT Treatment/Interventions  Manual techniques;Neuromuscular re-education;Patient/family education;Therapeutic exercise;Therapeutic activities;Iontophoresis 4mg /ml Dexamethasone;Moist Heat;Cryotherapy;Electrical Stimulation;Ultrasound;Passive range of motion;Dry needling;Joint Manipulations;Scar mobilization    PT Next Visit Plan  Review HEP and expand as needed; detailed MMT if desired    PT Home Exercise Plan  See education section    Consulted and Agree with Plan of Care  Patient       Patient will benefit from skilled therapeutic intervention in order to improve the following deficits and impairments:  Decreased range of motion, Decreased endurance, Decreased strength, Decreased balance, Pain, Decreased coordination, Decreased mobility, Decreased activity tolerance, Increased fascial restricitons  Visit Diagnosis: Acute midline low back pain without sciatica  Muscle weakness (generalized)  Other muscle spasm     Problem List Patient Active Problem List   Diagnosis Date Noted  . Lumbar stenosis with neurogenic claudication 12/26/2019  . Hyperbilirubinemia 12/14/2019  . Eye injury 05/26/2019  . Left shoulder pain 05/23/2018  . Snoring 05/23/2018  . Benign neoplasm of descending colon   . Benign neoplasm of ascending colon   . Vaginal atrophy 03/22/2017  . Meniere's disease 08/17/2016  . Colon cancer screening 08/11/2015  . Fatigue 06/03/2015  . Health care  maintenance 06/03/2015  . Neck pain 02/05/2014  . Hypercholesterolemia 02/05/2014  . Back pain 12/05/2012  . Trigger finger 12/05/2012  . Essential hypertension, benign 12/05/2012    Danny Zimny PT, DPT 02/29/2020, 8:28 AM  [place] [facility] SERVICES [address] [place], [place], [address] Phone: [phone]   Fax:  [phone]  Name: Sara Perry MRN: [id number] Date of Birth: January 14, 1961

## 2020-03-07 ENCOUNTER — Encounter: Payer: Self-pay | Admitting: Physical Therapy

## 2020-03-07 ENCOUNTER — Ambulatory Visit: Payer: 59 | Admitting: Physical Therapy

## 2020-03-07 ENCOUNTER — Other Ambulatory Visit: Payer: Self-pay

## 2020-03-07 DIAGNOSIS — M545 Low back pain, unspecified: Secondary | ICD-10-CM

## 2020-03-07 DIAGNOSIS — M6281 Muscle weakness (generalized): Secondary | ICD-10-CM | POA: Diagnosis not present

## 2020-03-07 DIAGNOSIS — M62838 Other muscle spasm: Secondary | ICD-10-CM

## 2020-03-07 NOTE — Therapy (Signed)
[place] [facility] SERVICES [address] [place], [place], [address] Phone: [phone]   Fax:  [phone]  Physical Therapy Treatment  Patient Details  Name: Sara Perry MRN: [id number] Date of Birth: [birth year] Referring Provider (PT): Dr. Jovita Gamma   Encounter Date: 03/07/2020  PT End of Session - 03/07/20 1143    Visit Number  6    Number of Visits  12    Date for PT Re-Evaluation  04/16/20    Authorization Type  [employer]    Authorization Time Period  02/15/20-04/16/20 (returns to work 6/7)    PT Start Time  1102    PT Stop Time  1145    PT Time Calculation (min)  43 min    Activity Tolerance  Patient tolerated treatment well;No increased pain    Behavior During Therapy  WFL for tasks assessed/performed       Past Medical History:  Diagnosis Date  . Complication of anesthesia    pt states she is very sensitive to most drugs, usually needs 1/2 of whatever other people get. She states as a child/teenager she had difficulty waking up after surgery due to the sensitivity that she has to drugs.  . Eczema   . Frequent UTI   . H/O lymphadenopathy    With previous submandibular node removals.  . Hypertension   . Recurrent sinus infections   . Sleep apnea    uses c-pap    Past Surgical History:  Procedure Laterality Date  . COLONOSCOPY WITH PROPOFOL N/A 08/25/2017   Procedure: COLONOSCOPY WITH PROPOFOL;  Surgeon: Lucilla Lame, MD;  Location: [facility] ENDOSCOPY;  Service: Endoscopy;  Laterality: N/A;  . Lymph Node Removal  1984  . NECK SURGERY  1977   H/O Right neck surgery secondary to a benign growth with when patient was 63 or 59 yo  . skin lesion extraction  2013   basal cell - Dr Phillip Heal  . Uterine Polyps Removed  2004    There were no vitals filed for this visit.  Subjective Assessment - 03/07/20 1138    Subjective  Patient reports tightness along lower thoracic/upper lumbar paraspinals. She reports no pain  currently; Adherent to HEP and has been trying to get in the pool.    Pertinent History  Has done PT in past for LBP as well as shoulder pain.    Limitations  Lifting    How long can you sit comfortably?  Up to 30 minutes    How long can you stand comfortably?  Less than 45 minutes    How long can you walk comfortably?  Has been walking 2-3x daily for 45-60 minutes (tolerated well)    Currently in Pain?  No/denies    Pain Onset  More than a month ago              TREATMENT: Patient prone: PT applied moist heat to low back in prone position x5 min;  PT assessed lumbar paraspinals, increased tightness noted in bilateral paraspinals, particularly along right T12 level and left T8-10 level;  PT performed soft/deep tissue massage including myofascial release along bilateral paraspinals; utilized Edge tool for IASTM which patient tolerated well; x20 min;  Patient able to exhibit significant improved in tissue mobility and reports resolution in tightness;  Prone:  Alternate hamstring curl x10 reps each LE;  Alternate hip extension x10 reps each LE with mod VCS for core stabilization and to avoid hip rotation for better trunk stabilization;  Patient able to exhibit good multifidi activation with minimal trunk rotation;   Qped: Childs pose 30 sec hold x2-3reps with min VCs for proper positioning. Patient able to tolerate well with good control;  Cat/camel stretch 10 sec hold x5 reps each direction with good control and positioning; -Alternate UE/LE lift x5 reps, utilized stick to provide tactile cues to avoid trunk rotation; Patient very weak with increased shakiness noted;   Response to treatment:tolerated session well. She denies any increase in pain at end of session. Patient able to exhibit good positioning and muscle activation with advanced exercise. She does fatigue quickly being able to tolerate limited repetition due to weakness.  Advanced HEP, patient verbalized  understanding;                     PT Education - 03/07/20 1143    Education Details  lumbar strengthening, HEP    Person(s) Educated  Patient    Methods  Explanation;Verbal cues    Comprehension  Verbalized understanding;Returned demonstration;Verbal cues required;Need further instruction       PT Short Term Goals - 02/15/20 1424      PT SHORT TERM GOAL #1   Title  After 4 weeks pt to report full indepdence in HEP for progressing strength and activity tolerance.    Baseline  no HEP    Time  4    Period  Weeks    Status  New    Target Date  03/14/20        PT Long Term Goals - 02/15/20 1425      PT LONG TERM GOAL #1   Title  After 6 weeks pt to report full tolerance of return to FT work, ability to perform all work duties.    Baseline  still on FMLA    Time  6    Period  Weeks    Status  New    Target Date  03/28/20      PT LONG TERM GOAL #2   Title  After 6 weeks pt will report knowledge of return to gym activities congruent with safe loading of lumbar spine.    Baseline  machine based and aerobic programs formerly.    Time  6    Period  Weeks    Status  New    Target Date  03/28/20            Plan - 03/07/20 1149    Clinical Impression Statement  Patient motivated and participated well within session. PT performed extensive manual therapy to reduce tightness in paraspinals. Patient tolerated well exhibiting significant reduction in tightness and better muscle activation. Patient instructed in lumbar strengthening exercise. She was able to exhibit better motor control and positioning but does exhibit weakness with qped exercise, being limited to short repetitions. Patient would benefit from additional skilled PT intervention to improve strength, ROM and reduce pain with ADLs.    Personal Factors and Comorbidities  Age;Education;Behavior Pattern;Fitness;Past/Current Experience;Profession;Time since onset of injury/illness/exacerbation     Examination-Activity Limitations  Squat;Carry;Continence;Stairs;Stand;Transfers    Examination-Participation Restrictions  Driving;Community Activity;Cleaning;Laundry;Interpersonal Relationship    Stability/Clinical Decision Making  Stable/Uncomplicated    Rehab Potential  Excellent    Clinical Impairments Affecting Rehab Potential  (+) highly active (+) fitness    PT Frequency  2x / week    PT Duration  6 weeks    PT Treatment/Interventions  Manual techniques;Neuromuscular re-education;Patient/family education;Therapeutic exercise;Therapeutic activities;Iontophoresis 4mg /ml Dexamethasone;Moist Heat;Cryotherapy;Electrical Stimulation;Ultrasound;Passive range of motion;Dry needling;Joint Manipulations;Scar mobilization  PT Next Visit Plan  Review HEP and expand as needed; detailed MMT if desired    PT Home Exercise Plan  See education section    Consulted and Agree with Plan of Care  Patient       Patient will benefit from skilled therapeutic intervention in order to improve the following deficits and impairments:  Decreased range of motion, Decreased endurance, Decreased strength, Decreased balance, Pain, Decreased coordination, Decreased mobility, Decreased activity tolerance, Increased fascial restricitons  Visit Diagnosis: Acute midline low back pain without sciatica  Muscle weakness (generalized)  Other muscle spasm     Problem List Patient Active Problem List   Diagnosis Date Noted  . Lumbar stenosis with neurogenic claudication 12/26/2019  . Hyperbilirubinemia 12/14/2019  . Eye injury 05/26/2019  . Left shoulder pain 05/23/2018  . Snoring 05/23/2018  . Benign neoplasm of descending colon   . Benign neoplasm of ascending colon   . Vaginal atrophy 03/22/2017  . Meniere's disease 08/17/2016  . Colon cancer screening 08/11/2015  . Fatigue 06/03/2015  . Health care maintenance 06/03/2015  . Neck pain 02/05/2014  . Hypercholesterolemia 02/05/2014  . Back pain 12/05/2012   . Trigger finger 12/05/2012  . Essential hypertension, benign 12/05/2012    Livy Ross PT, DPT 03/07/2020, 11:51 AM  [place] [facility] SERVICES [address] [place], [place], [address] Phone: [phone]   Fax:  [phone]  Name: Sara Perry MRN: [id number] Date of Birth: 06-May-1961

## 2020-03-08 ENCOUNTER — Ambulatory Visit: Payer: 59

## 2020-03-08 ENCOUNTER — Other Ambulatory Visit: Payer: Self-pay

## 2020-03-08 DIAGNOSIS — M6281 Muscle weakness (generalized): Secondary | ICD-10-CM

## 2020-03-08 DIAGNOSIS — M62838 Other muscle spasm: Secondary | ICD-10-CM

## 2020-03-08 DIAGNOSIS — M545 Low back pain, unspecified: Secondary | ICD-10-CM

## 2020-03-08 NOTE — Therapy (Signed)
[place] MAIN [facility] SERVICES [address] [place], [place], [address] Phone: [phone]   Fax:  [phone]  Physical Therapy Treatment  The patient has been informed of current processes in place at Outpatient Rehab to protect patients from Covid-19 exposure including social distancing, schedule modifications, and new cleaning procedures. After discussing their particular risk with a therapist based on the patient's personal risk factors, the patient has decided to proceed with in-person therapy.   Patient Details  Name: Sara Perry MRN: [id number] Date of Birth: January 26, 1961 Referring Provider (PT): Dr. Jovita Gamma   Encounter Date: 03/08/2020  PT End of Session - 03/08/20 1513    Visit Number  7    Number of Visits  12    Date for PT Re-Evaluation  04/16/20    Authorization Type  [employer]    Authorization Time Period  02/15/20-04/16/20 (returns to work 6/7)    PT Start Time  1107    PT Stop Time  1224    PT Time Calculation (min)  77 min    Activity Tolerance  Patient tolerated treatment well;No increased pain    Behavior During Therapy  WFL for tasks assessed/performed       Past Medical History:  Diagnosis Date  . Complication of anesthesia    pt states she is very sensitive to most drugs, usually needs 1/2 of whatever other people get. She states as a child/teenager she had difficulty waking up after surgery due to the sensitivity that she has to drugs.  . Eczema   . Frequent UTI   . H/O lymphadenopathy    With previous submandibular node removals.  . Hypertension   . Recurrent sinus infections   . Sleep apnea    uses c-pap    Past Surgical History:  Procedure Laterality Date  . COLONOSCOPY WITH PROPOFOL N/A 08/25/2017   Procedure: COLONOSCOPY WITH PROPOFOL;  Surgeon: Lucilla Lame, MD;  Location: [facility] ENDOSCOPY;  Service: Endoscopy;  Laterality: N/A;  . Lymph Node Removal  1984  . NECK SURGERY  1977   H/O Right  neck surgery secondary to a benign growth with when patient was 53 or 60 yo  . skin lesion extraction  2013   basal cell - Dr Phillip Heal  . Uterine Polyps Removed  2004    There were no vitals filed for this visit.    Pelvic Floor Physical Therapy Treatment Note  SCREENING  Changes in medications, allergies, or medical history?: none    SUBJECTIVE  Patient reports: She had a disc collapse that led to her not being able to walk and had a L4-L5 fusion for a spondylolisthesis but still has some arthritis and DDD. Was having a hard time emptying her bladder for the 2 weeks leading up to her surgery. Now has some residual volume with bladder emptying but is constipated. Having a BM daily but having to strain or take a really long time to allow for passing the BM. Trying to get ~ 60-0 oz of water per day.   Urge incontinence, feeling strong urge ~ every 1-2 hours. Not leaking since surgery but was before occasionally.   Precautions:  L4-5 fusion, menopausal  Pain update:  Location of pain: T-L junction and SIJ region Current pain:  2/10  Max pain:  5/10 Least pain:  0/10 Nature of pain: pinch/burning/grabbing  Patient Goals:    OBJECTIVE  Changes in: Posture/Observations:  R PSIS and ASIS slightly high,   Special tests:  LLD: RLE measured ~ 1/2 cm long  Range of Motion/Flexibilty:  Forward bend: Fingers ~ 2 feet from floor  Strength/MMT:  LE MMT:  Pelvic floor:  Abdominal:  Pt. Able to engage TA and obliques with MIN to MOD VC and TC and could reproduce well following initial learning.  Palpation: TTP to R>L Iliacus and Psoas  Gait Analysis:  INTERVENTIONS THIS SESSION: Manual: Performed TP release and STM to R Iliacus to decrease spasm and pain and allow for improved balance of musculature for improved function and decreased symptoms.  Dry-needle: Performed TPDN with a .30x26mm needle and standard approach as described below to decrease spasm and pain and allow  for improved balance of musculature for improved function and decreased symptoms.  Therex: educated on and practiced posterior pelvic tilts with emphasis on engaging TA and multifidus to improve strength of muscles opposing tight musculature to allow reciprocal inhibition to improve balance of musculature surrounding the pelvis and improve overall posture for optimal musculature length-tension relationship and function.   Total time: 77 min.                     Trigger Point Dry Needling - 03/08/20 0001    Consent Given?  Yes    Education Handout Provided  No    Muscles Treated Back/Hip  Iliacus    Dry Needling Comments  reacted in contralateral hip, B adductors and within PFM as well as R iliacus/psoas    Iliacus Response  Twitch response elicited;Palpable increased muscle length             PT Short Term Goals - 02/15/20 1424      PT SHORT TERM GOAL #1   Title  After 4 weeks pt to report full indepdence in HEP for progressing strength and activity tolerance.    Baseline  no HEP    Time  4    Period  Weeks    Status  New    Target Date  03/14/20        PT Long Term Goals - 02/15/20 1425      PT LONG TERM GOAL #1   Title  After 6 weeks pt to report full tolerance of return to FT work, ability to perform all work duties.    Baseline  still on FMLA    Time  6    Period  Weeks    Status  New    Target Date  03/28/20      PT LONG TERM GOAL #2   Title  After 6 weeks pt will report knowledge of return to gym activities congruent with safe loading of lumbar spine.    Baseline  machine based and aerobic programs formerly.    Time  6    Period  Weeks    Status  New    Target Date  03/28/20            Plan - 03/08/20 1514    Clinical Impression Statement  Pt. responded well to all interventions today with great reaction to manual and dry needling interventions and reaction through B hip-flexors, adductors, and PFM indicating a large release of  spasm/tension. She demonstrated understanding and correct performance of posterior pelvic tilt deep-core recruitment and will continue to benefit from further assessment of possible LLD with RLE slightly long Vs. up-slip or R anterior rotation as well as mobility and spasm reduction near T-L and L-S junctions to decrease pressure on PFM/bladder innervation levels.  Personal Factors and Comorbidities  Age;Education;Behavior Pattern;Fitness;Past/Current Experience;Profession;Time since onset of injury/illness/exacerbation    Examination-Activity Limitations  Squat;Carry;Continence;Stairs;Stand;Transfers    Examination-Participation Restrictions  Driving;Community Activity;Cleaning;Laundry;Interpersonal Relationship    Stability/Clinical Decision Making  Stable/Uncomplicated    Rehab Potential  Excellent    Clinical Impairments Affecting Rehab Potential  (+) highly active (+) fitness    PT Frequency  2x / week    PT Duration  6 weeks    PT Treatment/Interventions  Manual techniques;Neuromuscular re-education;Patient/family education;Therapeutic exercise;Therapeutic activities;Iontophoresis 4mg /ml Dexamethasone;Moist Heat;Cryotherapy;Electrical Stimulation;Ultrasound;Passive range of motion;Dry needling;Joint Manipulations;Scar mobilization    PT Next Visit Plan  Review HEP and expand as needed; detailed MMT if desired    PT Home Exercise Plan  See education section    Consulted and Agree with Plan of Care  Patient       Patient will benefit from skilled therapeutic intervention in order to improve the following deficits and impairments:  Decreased range of motion, Decreased endurance, Decreased strength, Decreased balance, Pain, Decreased coordination, Decreased mobility, Decreased activity tolerance, Increased fascial restricitons  Visit Diagnosis: Acute midline low back pain without sciatica  Muscle weakness (generalized)  Other muscle spasm     Problem List Patient Active Problem List    Diagnosis Date Noted  . Lumbar stenosis with neurogenic claudication 12/26/2019  . Hyperbilirubinemia 12/14/2019  . Eye injury 05/26/2019  . Left shoulder pain 05/23/2018  . Snoring 05/23/2018  . Benign neoplasm of descending colon   . Benign neoplasm of ascending colon   . Vaginal atrophy 03/22/2017  . Meniere's disease 08/17/2016  . Colon cancer screening 08/11/2015  . Fatigue 06/03/2015  . Health care maintenance 06/03/2015  . Neck pain 02/05/2014  . Hypercholesterolemia 02/05/2014  . Back pain 12/05/2012  . Trigger finger 12/05/2012  . Essential hypertension, benign 12/05/2012   Willa Rough DPT, ATC Willa Rough 03/08/2020, 3:28 PM  [place] MAIN [facility] SERVICES [address] [place], [place], [address] Phone: [phone]   Fax:  [phone]  Name: Texanna Mcclaugherty MRN: [id number] Date of Birth: [birth year]

## 2020-03-13 ENCOUNTER — Other Ambulatory Visit: Payer: Self-pay

## 2020-03-13 ENCOUNTER — Ambulatory Visit: Payer: 59 | Attending: Neurosurgery | Admitting: Physical Therapy

## 2020-03-13 ENCOUNTER — Encounter: Payer: Self-pay | Admitting: Physical Therapy

## 2020-03-13 DIAGNOSIS — M47816 Spondylosis without myelopathy or radiculopathy, lumbar region: Secondary | ICD-10-CM | POA: Diagnosis not present

## 2020-03-13 DIAGNOSIS — M62838 Other muscle spasm: Secondary | ICD-10-CM | POA: Insufficient documentation

## 2020-03-13 DIAGNOSIS — Z981 Arthrodesis status: Secondary | ICD-10-CM | POA: Diagnosis not present

## 2020-03-13 DIAGNOSIS — M545 Low back pain, unspecified: Secondary | ICD-10-CM

## 2020-03-13 DIAGNOSIS — M6281 Muscle weakness (generalized): Secondary | ICD-10-CM | POA: Insufficient documentation

## 2020-03-13 DIAGNOSIS — M25512 Pain in left shoulder: Secondary | ICD-10-CM | POA: Insufficient documentation

## 2020-03-13 DIAGNOSIS — M25511 Pain in right shoulder: Secondary | ICD-10-CM | POA: Insufficient documentation

## 2020-03-13 DIAGNOSIS — G8929 Other chronic pain: Secondary | ICD-10-CM | POA: Diagnosis not present

## 2020-03-13 NOTE — Therapy (Signed)
[place] MAIN [facility] SERVICES [address] [place], [place], [address] Phone: [phone]   Fax:  [phone]  Physical Therapy Treatment  Patient Details  Name: Sara Perry MRN: [id number] Date of Birth: 06/26/1961 Referring Provider (PT): Dr. Jovita Gamma   Encounter Date: 03/13/2020  PT End of Session - 03/13/20 1203    Visit Number  8    Number of Visits  12    Date for PT Re-Evaluation  04/16/20    Authorization Type  [employer]    Authorization Time Period  02/15/20-04/16/20 (returns to work 6/7)    PT Start Time  1148    PT Stop Time  1230    PT Time Calculation (min)  42 min    Activity Tolerance  Patient tolerated treatment well;No increased pain    Behavior During Therapy  WFL for tasks assessed/performed       Past Medical History:  Diagnosis Date   Complication of anesthesia    pt states she is very sensitive to most drugs, usually needs 1/2 of whatever other people get. She states as a child/teenager she had difficulty waking up after surgery due to the sensitivity that she has to drugs.   Eczema    Frequent UTI    H/O lymphadenopathy    With previous submandibular node removals.   Hypertension    Recurrent sinus infections    Sleep apnea    uses c-pap    Past Surgical History:  Procedure Laterality Date   COLONOSCOPY WITH PROPOFOL N/A 08/25/2017   Procedure: COLONOSCOPY WITH PROPOFOL;  Surgeon: Lucilla Lame, MD;  Location: [facility] ENDOSCOPY;  Service: Endoscopy;  Laterality: N/A;   Lymph Node Removal  [place]   H/O Right neck surgery secondary to a benign growth with when patient was 59 or 59 yo   skin lesion extraction  2013   basal cell - Dr Phillip Heal   Uterine Polyps Removed  2004    There were no vitals filed for this visit.  Subjective Assessment - 03/13/20 1201    Subjective  Patient has followed up with physician with good report; Reports continued soreness in posterior  right SI joint; Did get release to return to work light duty;    Pertinent History  Has done PT in past for LBP as well as shoulder pain.    Limitations  Lifting    How long can you sit comfortably?  Up to 30 minutes    How long can you stand comfortably?  Less than 45 minutes    How long can you walk comfortably?  Has been walking 2-3x daily for 45-60 minutes (tolerated well)    Currently in Pain?  Yes    Pain Score  2     Pain Location  Back    Pain Orientation  Right    Pain Descriptors / Indicators  Aching;Sore    Pain Type  Chronic pain    Pain Onset  More than a month ago    Pain Frequency  Intermittent    Aggravating Factors   certain movement    Pain Relieving Factors  walking    Effect of Pain on Daily Activities  guarded activity       TREATMENT:  PT assessed R SI joint;  (+) long sit test with right leg long to short (+) prone knee bent test with right leg long to short Both indicating possible anterior pelvic rotation in right  SI joint  Measured leg length, ASIS to medial malleolus, both at 87 cm;  Patient supine; RLE single leg bridge x10 reps LLE isometric hip flexion 5 sec hold x10 reps; Progressed to combined right single leg bridge and LLE isometric hip flexion 5 sec hold x10 reps;  PT performed mobilization with movement, RLE isometric hip extension with manual mobilization to right SI joint in posterior direction x5 reps;  Following exercise, re-assessed SI joint with long sit test (-) bilaterally,  Patient ambulated around gym x100 feet with minimal to no SI joint pain  Patient prone: 2# ankle weight: -hamstring curl x10 reps each LE -hip extension SLR x10 reps each LE -gluteal max hip extension x10 reps each LE with min VCS for proper positioning;  Patient exhibits increased tightness in thoracolumbar paraspinals (L>R); PT performed soft/deep tissue massage utilizing edge tool for better tissue extensibility x8 min; Tolerated well with less tightness  on right side, but has continued paraspinal tightness on left side.    Patient tolerated session well;  She reports less SI joint discomfort following isometric/mobilization activities. Instructed patient in extension strengthening to stabilize pelvis and improve posterior hip strength; Patient did exhibit increased tightness in thoracolumbar paraspinals (L>R) which was somewhat relieved with manual therapy; Reinforced HEP                         PT Education - 03/13/20 1202    Education Details  lumbar strengthening, HEP    Person(s) Educated  Patient    Methods  Explanation;Verbal cues    Comprehension  Verbalized understanding;Returned demonstration;Verbal cues required;Need further instruction       PT Short Term Goals - 02/15/20 1424      PT SHORT TERM GOAL #1   Title  After 4 weeks pt to report full indepdence in HEP for progressing strength and activity tolerance.    Baseline  no HEP    Time  4    Period  Weeks    Status  New    Target Date  03/14/20        PT Long Term Goals - 02/15/20 1425      PT LONG TERM GOAL #1   Title  After 6 weeks pt to report full tolerance of return to FT work, ability to perform all work duties.    Baseline  still on FMLA    Time  6    Period  Weeks    Status  New    Target Date  03/28/20      PT LONG TERM GOAL #2   Title  After 6 weeks pt will report knowledge of return to gym activities congruent with safe loading of lumbar spine.    Baseline  machine based and aerobic programs formerly.    Time  6    Period  Weeks    Status  New    Target Date  03/28/20            Plan - 03/13/20 1430    Clinical Impression Statement  Patient motivated and participated well within session. Patient continues to have right SI joint discomfort; PT idenitified possible anterior rotation; PT instructed patient in right posterior hip strengthening and left anterior hip strengthening to facilitate better pelvic mobility;  Patient tolerated well reporting less pain following exercise. She was instructed in advanced strengthening. She continues to have tightness along bilateral thoracolumbar paraspinals. Patient responds well to soft tissue massage; She would benefit  from additional skilled PT intervention to improve strength and reduce back pain with ADLs.    Personal Factors and Comorbidities  Age;Education;Behavior Pattern;Fitness;Past/Current Experience;Profession;Time since onset of injury/illness/exacerbation    Examination-Activity Limitations  Squat;Carry;Continence;Stairs;Stand;Transfers    Examination-Participation Restrictions  Driving;Community Activity;Cleaning;Laundry;Interpersonal Relationship    Stability/Clinical Decision Making  Stable/Uncomplicated    Rehab Potential  Excellent    Clinical Impairments Affecting Rehab Potential  (+) highly active (+) fitness    PT Frequency  2x / week    PT Duration  6 weeks    PT Treatment/Interventions  Manual techniques;Neuromuscular re-education;Patient/family education;Therapeutic exercise;Therapeutic activities;Iontophoresis 4mg /ml Dexamethasone;Moist Heat;Cryotherapy;Electrical Stimulation;Ultrasound;Passive range of motion;Dry needling;Joint Manipulations;Scar mobilization    PT Next Visit Plan  Review HEP and expand as needed; detailed MMT if desired    PT Home Exercise Plan  See education section    Consulted and Agree with Plan of Care  Patient       Patient will benefit from skilled therapeutic intervention in order to improve the following deficits and impairments:  Decreased range of motion, Decreased endurance, Decreased strength, Decreased balance, Pain, Decreased coordination, Decreased mobility, Decreased activity tolerance, Increased fascial restricitons  Visit Diagnosis: Acute midline low back pain without sciatica  Muscle weakness (generalized)  Other muscle spasm     Problem List Patient Active Problem List   Diagnosis Date Noted    Lumbar stenosis with neurogenic claudication 12/26/2019   Hyperbilirubinemia 12/14/2019   Eye injury 05/26/2019   Left shoulder pain 05/23/2018   Snoring 05/23/2018   Benign neoplasm of descending colon    Benign neoplasm of ascending colon    Vaginal atrophy 03/22/2017   Meniere's disease 08/17/2016   Colon cancer screening 08/11/2015   Fatigue 06/03/2015   Health care maintenance 06/03/2015   Neck pain 02/05/2014   Hypercholesterolemia 02/05/2014   Back pain 12/05/2012   Trigger finger 12/05/2012   Essential hypertension, benign 12/05/2012    Mellisa Arshad PT, DPT 03/13/2020, 2:34 PM  [place] [address] [place], [place], [address] Phone: [phone]   Fax:  [phone]  Name: Sara Perry MRN: [id number] Date of Birth: September 26, 1961

## 2020-03-15 ENCOUNTER — Other Ambulatory Visit: Payer: Self-pay

## 2020-03-15 ENCOUNTER — Ambulatory Visit: Payer: 59

## 2020-03-15 DIAGNOSIS — M6281 Muscle weakness (generalized): Secondary | ICD-10-CM | POA: Diagnosis not present

## 2020-03-15 DIAGNOSIS — M62838 Other muscle spasm: Secondary | ICD-10-CM | POA: Diagnosis not present

## 2020-03-15 DIAGNOSIS — G8929 Other chronic pain: Secondary | ICD-10-CM | POA: Diagnosis not present

## 2020-03-15 DIAGNOSIS — M545 Low back pain, unspecified: Secondary | ICD-10-CM

## 2020-03-15 DIAGNOSIS — M25512 Pain in left shoulder: Secondary | ICD-10-CM | POA: Diagnosis not present

## 2020-03-15 DIAGNOSIS — M25511 Pain in right shoulder: Secondary | ICD-10-CM | POA: Diagnosis not present

## 2020-03-15 NOTE — Therapy (Signed)
[place] MAIN [facility] SERVICES [address] [place], [place], [address] Phone: [phone]   Fax:  [phone]  Physical Therapy Treatment  Patient Details  Name: Sara Perry MRN: [id number] Date of Birth: 04/30/1961 Referring Provider (PT): Dr. Jovita Gamma   Encounter Date: 03/15/2020  PT End of Session - 03/15/20 0922    Visit Number  9    Number of Visits  20    Date for PT Re-Evaluation  04/16/20    Authorization Type  [employer]    Authorization Time Period  02/15/20-04/16/20 (returns to work 6/7)    PT Start Time  [phone]    PT Stop Time  0930    PT Time Calculation (min)  37 min    Activity Tolerance  Patient tolerated treatment well;No increased pain    Behavior During Therapy  WFL for tasks assessed/performed       Past Medical History:  Diagnosis Date  . Complication of anesthesia    pt states she is very sensitive to most drugs, usually needs 1/2 of whatever other people get. She states as a child/teenager she had difficulty waking up after surgery due to the sensitivity that she has to drugs.  . Eczema   . Frequent UTI   . H/O lymphadenopathy    With previous submandibular node removals.  . Hypertension   . Recurrent sinus infections   . Sleep apnea    uses c-pap    Past Surgical History:  Procedure Laterality Date  . COLONOSCOPY WITH PROPOFOL N/A 08/25/2017   Procedure: COLONOSCOPY WITH PROPOFOL;  Surgeon: Lucilla Lame, MD;  Location: [facility] ENDOSCOPY;  Service: Endoscopy;  Laterality: N/A;  . Lymph Node Removal  1984  . NECK SURGERY  1977   H/O Right neck surgery secondary to a benign growth with when patient was 43 or 59 yo  . skin lesion extraction  2013   basal cell - Dr Phillip Heal  . Uterine Polyps Removed  2004    There were no vitals filed for this visit.  Subjective Assessment - 03/15/20 0918    Subjective  Pt doing well this date, continues to work on mobility at home. Pt has clearnance to return to work  on reduced duty in a few days.    Pertinent History  Has done PT in past for LBP as well as shoulder pain.    How long can you sit comfortably?  Up to 30 minutes    How long can you stand comfortably?  Less than 45 minutes    How long can you walk comfortably?  Has been walking 2-3x daily for 45-60 minutes (tolerated well)    Currently in Pain?  No/denies         [facility] PT Assessment - 03/15/20 0001      Assessment   Medical Diagnosis  s/p Lumbar L4/5 PLIF, decompression    Referring Provider (PT)  Dr. Jovita Gamma    Onset Date/Surgical Date  12/26/19    Hand Dominance  Right    Next MD Visit  --   September 2021   Prior Therapy  None since surgery       Precautions   Precautions  Back;None   Per pt, all activity allowed within tolerable range   Required Braces or Orthoses  --   none     Balance Screen   Has the patient fallen in the past 6 months  No    Has the patient had a  decrease in activity level because of a fear of falling?   No    Is the patient reluctant to leave their home because of a fear of falling?   No      Home Social worker  Private residence    Living Arrangements  Children   DTR moving back to college soon   Available Help at Discharge  Family    Type of [place] to enter    Entrance Stairs-Number of Steps  3    [place]  Two level;Able to live on main level with bedroom/bathroom    Alternate Level Stairs-Number of Steps  13    Alternate Level Stairs-Rails  Left      Prior Function   Level of Independence  Independent    Vocation  Full time employment    Vocation Requirements  pull, twisting, reaching, mobilizing patients for imaging    Leisure  walking, resistive machines at gym, exercise classes      Observation/Other Assessments   Focus on Therapeutic Outcomes (FOTO)   56 (44% impaired)    45 (55% impaired) at evaluation      Transfers   Five time sit to  stand comments   12.39sec   hands free, standard chair     INTERVENTION THIS DATE:  -education on scar massage for performance at home by self and/or caregiver.  -activity modification education, and review of outcomes measures, goals  -FOTO    PT Short Term Goals - 03/15/20 0924      PT SHORT TERM GOAL #1   Title  After 4 weeks pt to report full indepdence in HEP for progressing strength and activity tolerance.    Baseline  no HEP    Time  4    Period  Weeks    Status  Achieved    Target Date  03/14/20        PT Long Term Goals - 03/15/20 0925      PT LONG TERM GOAL #1   Title  After 6 weeks pt to report full tolerance of return to FT work, ability to perform all work duties.    Baseline  still on FMLA    Time  6    Period  Weeks    Status  On-going    Target Date  03/28/20      PT LONG TERM GOAL #2   Title  After 6 weeks pt will report knowledge of return to gym activities congruent with safe loading of lumbar spine.    Baseline  machine based and aerobic programs formerly.    Time  6    Period  Weeks    Status  On-going    Target Date  03/28/20      PT LONG TERM GOAL #3   Title  Patient will be independent with HEP to continue benefits of therapy until after discharge.    Time  6    Period  Weeks    Status  Achieved            Plan - 03/15/20 1206    Clinical Impression Statement  Reassessment this date: pt showing progress in all areas. Pt returning to work next week. Pt still limiting in all areas of prior back precautions that she in now permited to do. Pt will need extensive education on safe return to the gym, as she  is a regular gym goer. Pt c/o continued tightness in paraspinal muscles, would like to learn new stretches. Pt educated on scar massage.    Personal Factors and Comorbidities  Age;Education;Behavior Pattern;Fitness;Past/Current Experience;Profession;Time since onset of injury/illness/exacerbation    Examination-Activity Limitations   Squat;Carry;Continence;Stairs;Stand;Transfers    Examination-Participation Restrictions  Driving;Community Activity;Cleaning;Laundry;Interpersonal Relationship    Stability/Clinical Decision Making  Stable/Uncomplicated    Clinical Decision Making  Low    Rehab Potential  Excellent    Clinical Impairments Affecting Rehab Potential  (+) highly active (+) fitness    PT Frequency  2x / week    PT Duration  6 weeks    PT Treatment/Interventions  Manual techniques;Neuromuscular re-education;Patient/family education;Therapeutic exercise;Therapeutic activities;Iontophoresis 4mg /ml Dexamethasone;Moist Heat;Cryotherapy;Electrical Stimulation;Ultrasound;Passive range of motion;Dry needling;Joint Manipulations;Scar mobilization    PT Next Visit Plan  Review HEP and expand as needed; detailed MMT if desired    PT Home Exercise Plan  See education section    Consulted and Agree with Plan of Care  Patient       Patient will benefit from skilled therapeutic intervention in order to improve the following deficits and impairments:  Decreased range of motion, Decreased endurance, Decreased strength, Decreased balance, Pain, Decreased coordination, Decreased mobility, Decreased activity tolerance, Increased fascial restricitons  Visit Diagnosis: Acute midline low back pain without sciatica  Muscle weakness (generalized)  Other muscle spasm  Chronic right shoulder pain  Chronic left shoulder pain     Problem List Patient Active Problem List   Diagnosis Date Noted  . Lumbar stenosis with neurogenic claudication 12/26/2019  . Hyperbilirubinemia 12/14/2019  . Eye injury 05/26/2019  . Left shoulder pain 05/23/2018  . Snoring 05/23/2018  . Benign neoplasm of descending colon   . Benign neoplasm of ascending colon   . Vaginal atrophy 03/22/2017  . Meniere's disease 08/17/2016  . Colon cancer screening 08/11/2015  . Fatigue 06/03/2015  . Health care maintenance 06/03/2015  . Neck pain 02/05/2014   . Hypercholesterolemia 02/05/2014  . Back pain 12/05/2012  . Trigger finger 12/05/2012  . Essential hypertension, benign 12/05/2012   12:09 PM, 03/15/20 Etta Grandchild, PT, DPT Physical Therapist - [place] [phone]    Etta Grandchild 03/15/2020, 12:09 PM  [place] MAIN [facility] SERVICES [address] [place], [place], [address] Phone: [phone]   Fax:  [phone]  Name: Sara Perry MRN: [id number] Date of Birth: December 14, 1960

## 2020-03-16 ENCOUNTER — Ambulatory Visit: Payer: 59

## 2020-03-16 ENCOUNTER — Other Ambulatory Visit: Payer: Self-pay

## 2020-03-16 DIAGNOSIS — M6281 Muscle weakness (generalized): Secondary | ICD-10-CM | POA: Diagnosis not present

## 2020-03-16 DIAGNOSIS — M545 Low back pain, unspecified: Secondary | ICD-10-CM

## 2020-03-16 DIAGNOSIS — G8929 Other chronic pain: Secondary | ICD-10-CM | POA: Diagnosis not present

## 2020-03-16 DIAGNOSIS — M25512 Pain in left shoulder: Secondary | ICD-10-CM | POA: Diagnosis not present

## 2020-03-16 DIAGNOSIS — M62838 Other muscle spasm: Secondary | ICD-10-CM | POA: Diagnosis not present

## 2020-03-16 DIAGNOSIS — M25511 Pain in right shoulder: Secondary | ICD-10-CM | POA: Diagnosis not present

## 2020-03-16 NOTE — Therapy (Addendum)
[place] MAIN [facility] SERVICES [address] [place], [place], [address] Phone: [phone]   Fax:  [phone]  Physical Therapy Progress Note   Dates of reporting period  02/15/20   to   03/16/20   The patient has been informed of current processes in place at Outpatient Rehab to protect patients from Covid-19 exposure including social distancing, schedule modifications, and new cleaning procedures. After discussing their particular risk with a therapist based on the patient's personal risk factors, the patient has decided to proceed with in-person therapy.   Patient Details  Name: Sara Perry MRN: [id number] Date of Birth: 28-Nov-1960 Referring Provider (PT): Dr. Jovita Gamma   Encounter Date: 03/16/2020  PT End of Session - 03/20/20 0955    Visit Number  10    Number of Visits  20    Date for PT Re-Evaluation  04/16/20    Authorization Type  [employer]    Authorization Time Period  02/15/20-04/16/20 (returns to work 6/7)    PT Start Time  1000    PT Stop Time  1100    PT Time Calculation (min)  60 min    Activity Tolerance  Patient tolerated treatment well;No increased pain    Behavior During Therapy  WFL for tasks assessed/performed       Past Medical History:  Diagnosis Date  . Complication of anesthesia    pt states she is very sensitive to most drugs, usually needs 1/2 of whatever other people get. She states as a child/teenager she had difficulty waking up after surgery due to the sensitivity that she has to drugs.  . Eczema   . Frequent UTI   . H/O lymphadenopathy    With previous submandibular node removals.  . Hypertension   . Recurrent sinus infections   . Sleep apnea    uses c-pap    Past Surgical History:  Procedure Laterality Date  . COLONOSCOPY WITH PROPOFOL N/A 08/25/2017   Procedure: COLONOSCOPY WITH PROPOFOL;  Surgeon: Lucilla Lame, MD;  Location: [facility] ENDOSCOPY;  Service: Endoscopy;  Laterality: N/A;  . Lymph  Node Removal  1984  . NECK SURGERY  1977   H/O Right neck surgery secondary to a benign growth with when patient was 36 or 59 yo  . skin lesion extraction  2013   basal cell - Dr Phillip Heal  . Uterine Polyps Removed  2004    There were no vitals filed for this visit.    Pelvic Floor Physical Therapy Treatment and Progress Note  SCREENING  Changes in medications, allergies, or medical history?: none    SUBJECTIVE  Patient reports: Had her follow up appointment and was told that the "hump" that she could feel there was scar tissue, she has started doing some self massage to it. Is going back to work next week. Has done some side leg-lifts and has soreness in the lateral hip on the R more than L. Feels like she still retains a little bit but has been trying to drink more water. Waking up with strong urge to urinate and having a strong urge ~ 6 times per day and also "JICing" throughout the day "quite frequently". Had leakage 1-2 times with sneezing.   Feels like she is able to relax a little better to allow for BM's to pass. She feels the urge to have a BM but then when she sits down the "connection with the urge is lost"  Precautions:  L4-5 fusion, menopausal  Pain  update:  Location of pain: T-L junction and SIJ region Current pain:  3/10  Max pain:  4/10 Least pain:  0/10 Nature of pain: pinch/burning/grabbing  Patient Goals:    OBJECTIVE  Changes in: Posture/Observations:  R PSIS and ASIS slightly high,   Special tests:  LLD: RLE measured ~ 1/2 cm long  Range of Motion/Flexibilty:  Forward bend: Fingers ~ 2 feet from floor  Strength/MMT:  LE MMT:   Pelvic Floor External Exam: Introitus Appears: WNL Skin integrity: WNL Palpation: no TTP externally Cough: intact Prolapse visible?: no Scar mobility: N/A  Internal Vaginal Exam: Strength (PERF): 4/5, 5 seconds, 2 times. Symmetry: R>L for TTP Palpation: TTP throughout but on R>L Prolapse: anterior wall  visible ~ 1/2 cm above the introitus with bearing down.  Abdominal:   Palpation:  Gait Analysis:  INTERVENTIONS THIS SESSION: Manual: Assessed PFM and performed TP release on R to all internal PFM.   Therex: reviewed and practiced posterior pelvic tilts and progressed to mini-marches to improve strength of muscles opposing tight musculature to allow reciprocal inhibition to improve balance of musculature surrounding the pelvis and improve overall posture for optimal musculature length-tension relationship and function.  Self-care: Educated on prolapse, squatty potty, internal PFM release tools to help decrease SUI/UUI , constipation and urinary retention by improving recruitment and length-tension relationship of the PFM and decreasing intra-abdominal pressure.    Total time: 60 min.                             PT Short Term Goals - 03/15/20 1937      PT SHORT TERM GOAL #1   Title  After 4 weeks pt to report full indepdence in HEP for progressing strength and activity tolerance.    Baseline  no HEP    Time  4    Period  Weeks    Status  Achieved    Target Date  03/14/20        PT Long Term Goals - 03/20/20 1003      PT LONG TERM GOAL #1   Title  After 6 weeks pt to report full tolerance of return to FT work, ability to perform all work duties.    Baseline  still on FMLA    Time  6    Period  Weeks    Status  On-going    Target Date  03/28/20      PT LONG TERM GOAL #2   Title  Pt. will have returned to Gym activities without increased pain >2 points to demonstrate implementation of safety recommendations and use of appropriate form.    Baseline  machine based and aerobic programs formerly.    Time  6    Period  Weeks    Status  Revised    Target Date  04/16/20      PT LONG TERM GOAL #3   Title  Patient will be independent with HEP to continue benefits of therapy until after discharge.    Baseline  Pt. independent with all exercises provided  to date.    Time  6    Period  Weeks    Status  Achieved    Target Date  03/28/20      PT LONG TERM GOAL #4   Title  Pt. will report no leakage of urine, urinary retention, or straining with BM to demonstrate improved control and strength of the PFM    Baseline  Pt. having SUI with sneeze, feeling of incomplte bladder emptying and UUI mostly first thing in the morning.    Time  5    Period  Weeks    Status  New    Target Date  04/16/20            Plan - 03/20/20 0956    Clinical Impression Statement  Pelvic floor muscles were assessed today to determine to what extent they are affecting her urinary/bowel issues as well as LBP. She is making progress in all areas and responded well to all interventions today with decreased spasm and pain through the R PFM as well as decreased pain/tension in the R hip. SHe will continue to benefit from skilled PT to help decrease tension, scar tissue, and fascial restriction along the spine as well as to continue releasing and then strengthening the PFM to provide a strong foundation and protect the low back from further insult.    Personal Factors and Comorbidities  Age;Education;Behavior Pattern;Fitness;Past/Current Experience;Profession;Time since onset of injury/illness/exacerbation    Examination-Activity Limitations  Squat;Carry;Continence;Stairs;Stand;Transfers    Examination-Participation Restrictions  Driving;Community Activity;Cleaning;Laundry;Interpersonal Relationship    Stability/Clinical Decision Making  Stable/Uncomplicated    Rehab Potential  Excellent    Clinical Impairments Affecting Rehab Potential  (+) highly active (+) fitness    PT Frequency  2x / week    PT Duration  6 weeks    PT Treatment/Interventions  Manual techniques;Neuromuscular re-education;Patient/family education;Therapeutic exercise;Therapeutic activities;Iontophoresis 4mg /ml Dexamethasone;Moist Heat;Cryotherapy;Electrical Stimulation;Ultrasound;Passive range of  motion;Dry needling;Joint Manipulations;Scar mobilization    PT Next Visit Plan  Review HEP and expand as needed; detailed MMT if desired    PT Home Exercise Plan  See education section    Consulted and Agree with Plan of Care  Patient       Patient will benefit from skilled therapeutic intervention in order to improve the following deficits and impairments:  Decreased range of motion, Decreased endurance, Decreased strength, Decreased balance, Pain, Decreased coordination, Decreased mobility, Decreased activity tolerance, Increased fascial restricitons  Visit Diagnosis: Acute midline low back pain without sciatica  Muscle weakness (generalized)  Other muscle spasm     Problem List Patient Active Problem List   Diagnosis Date Noted  . Lumbar stenosis with neurogenic claudication 12/26/2019  . Hyperbilirubinemia 12/14/2019  . Eye injury 05/26/2019  . Left shoulder pain 05/23/2018  . Snoring 05/23/2018  . Benign neoplasm of descending colon   . Benign neoplasm of ascending colon   . Vaginal atrophy 03/22/2017  . Meniere's disease 08/17/2016  . Colon cancer screening 08/11/2015  . Fatigue 06/03/2015  . Health care maintenance 06/03/2015  . Neck pain 02/05/2014  . Hypercholesterolemia 02/05/2014  . Back pain 12/05/2012  . Trigger finger 12/05/2012  . Essential hypertension, benign 12/05/2012   Willa Rough DPT, ATC Willa Rough 03/20/2020, 10:21 AM  [place] MAIN [facility] SERVICES [address] [place], [place], [address] Phone: [phone]   Fax:  [phone]  Name: Carys Malina MRN: [id number] Date of Birth: 21-Feb-1961

## 2020-03-16 NOTE — Patient Instructions (Addendum)
Pelvic Tilt With Pelvic Floor (Hook-Lying)        Lie with hips and knees bent. Exhale and draw the low tummy in followed by feeling the glutes squeeze some and then finally feel the obliques (ribs pull in) breath in to relax and repeat Repeat _3x10__ times. Do _1-2__ times a day.  Put a pillow under your hips in this position when doing this exercise to help decrease pressure in the pelvis when it feels "heavy".   Mini-Marches    Exhale, drawing the lower tummy (TA) in toward the back bone and hold contraction while you lift one foot ~ 2 inches off the mat, then the other foot before relaxing and resetting. Try to keep your hips from rocking, using your hands to sense whether they are staying even as pictured.      Perform _3x10__ repetitions for _1-2__ sets. Do this _1-2_ times per day. (you do not have to do both tilts and marches unless you want to help remember how to get the tilt right or are having pelvic heaviness/leakage or urgency)   Child's Pose Pelvic Floor Lengthening    Sit in knee-chest position and reach arms forward. Separate knees for comfort. Hold position for _5__ breaths. Repeat _2-3__ times. Do _1-2__ times per day.   Intimate Rose internal trigger point release tool [phone]  Dr. Kathline Magic Premium Prostate Massager   [phone] Self Posterior Fourchette Stretching/Mobilization    1) Wash your hands and prop your body up so you can easily reach the vagina, bring hand-held mirror if desired.  2) Apply lubricant to the thumb and vaginal opening  3) Place thumb ~ 1/2 an inch into the vagina with the pad of the thumb pointed down and apply gentle pressure to the posterior fourchette.  4) Gently sweep the thumb side to side and in/out while maintaining pressure down toward the anus. Make sure the pressure is not so great that your muscles tighten up and guard, just enough to create slight discomfort.  Do this for ~ 10 min. As needed every 2-3 nights until  no tenderness is noted to decrease tightness and tenderness at the vaginal opening.  Your Vagina is Not Cussing! One of the most fascinating things I've learned as a pelvic floor physical therapist is that women really have a variety of ways that they wash their crotch. Should that be "crotches"? Can you make that plural? If not, why not? Tell me that.. But, cleaning the crotch - it's important. We clean our face, our armpits and our feet. The crotch has got a lot going on so it should be cleaned too, right? Women clean themselves differently, but that's not necessarily okay. There are some basic facts that are important to know when it comes to keeping your machine well-oiled and running, regardless of whether she's a [place] or a 2015 [employer]; cuz either way she's a beauty, right? So what is the right way for a woman to clean her vulvovaginal area in order to ensure cleanliness, odor reduction and avoidance of infection? Let's start with what I hear from patients: 1. "I usually douche because that's what my mother did." 2. "I use a lavender scented soap all over my body and I get a wash rag and scrub my vulva." 3. "I spread my labias and get soap on them and then I put soap inside my vagina. I'm very clean." 4. "I'm careful, so I go front to back with the soap. I  start at the vulva and soap it real good, then I reach over to my anus and get that soapy." 5. "I use a loofa on my vulva and then after I shower I spray a little perfume down there. You never know what's going to happen that day." Friends, Romans, Countrywomen - lend me your ear! All these people are WRONG! (and that's probably one reason why they're seeing me in the first place) If you want my advice, I'm going to be succinct, clear and direct. You can wash your vulvovaginal area any way you'd like as long as you are in the shower, eliminate all soap and let warm water run over the area and only use your hands. Just call me  the Lorene Dy of the vagina.or is that weird?  Here's what I want you to do: 1. Wash your hair. 2. Wash your body with soap. 3. Rinse everything off. 4. Let warm water rinse over your labias. Yes, you can spread your labias. 5. Let warm water rinse over your anus. 6. Get out of the shower.** 7. Gently and lovingly pat the vulva dry and put on white, cotton underwear. **You can wash your hands before getting out. So why am I so restricting? Here's why: 1. The vagina is self-cleansing. There is no need to douche or soap inside the vagina. It's already got a good bacteria called lactobacilli that has several important functions. Lactobacilli eats up bad bacteria that can cause infection, it keeps the vagina acidic in order to reduce the likelihood of infections and it's even postulated that lactobacilli can prompt the immune system. This helps reduce odor, infection and keeps the natural flora healthy. Oh, and get this - estrogen helps to feed lactobacilli. So if you're low on estrogen, it makes sense that you might be prone to more infections. Please, just don't use soap on the vulva or in the vagina. Trust me, your vagina is not cussing. (Ironically enough, the inside of the mouth is made up of the same durable tissue as the inside of the vagina.) 2. The vulva wasn't meant to be scrubbed - it's not a potato. The vulva is sensitive like your fingertips, the skin around your eyes and your lips. It's meant to detect fine detail (for pleasure), so being forceful with it is going to make it more sensitive in a negative way - hypersensitive (for pain). Scrubbing can remove a fine layer of the vulvovaginal tissue which can create an anti-histamine response - much like scrubbing your arm would make your arm red. This creates an inflammatory cascade of events. Many people will heal from this quite quickly and may not notice any discomfort, but others may start to notice some irritation after some time. This is  when you might start noticing sensitivity to things that never bothered you before like tight clothes, colorful underwear, lubricants or laundry detergent. 3. Scented items (or items with chemicals) like perfume (on the vulva), soap, bubble bath or even flavored or hot/cold/tingly/prickly/naughty sexual lubricant/condoms should be avoided as well because they could irritate the opening of the vagina (the vulvar vestibule) or the vagina itself. The vulvar vestibule is made of up different tissue than the vagina (but the same tissue as the urethra and bladder), so it's possible that using chemical products here can cause pain and the symptoms of a urinary tract infection (UTI). 4. The vagina needs to breathe. Wearing tight, conforming clothing all the time or daily pantyliners can be suffocating to your  vulvovaginal area and irritating to the skin. Give it a break sometimes and wear looser clothing and or no underwear at all (like at night). 5. If you have a sensitive vulva or are prone to a lot of symptoms of infections, consider wearing white, cotton underwear instead of the fancy stuff. Over time, it's possible to develop new allergies and unfortunately, some women develop allergies to synthetic materials and dyes in their underwear. This also means it's best to wash your underwear with a detergent that is made for sensitive skin and is free of chemicals. ** Note - we will expand this area in the near future (with Sara's blessings) to include other options for under wear or safe liners. Stay tuned!  And get this: Discharge doesn't mean you are dirty. Discharge is natural and comes from a variety of places, most of which are not the vagina itself. What you see on your underwear is a mix of oil and gland secretions from the vulva and it's also secretions from the uterus and the fallopian tubes. Discharge changes during different parts of the menstrual cycle because it serves different purposes. For example, when  you are ovulating, the discharge is a different consistency so that sperm can pass through it more easily. It's all normal and healthy. However, if it starts to change colors or smell really funky - this indicates a possible issue with an area that is potentially apart from the vagina. Soaping and scrubbing to high Gillie Manners is not going to fix this - you really need to get checked out by a doctor in this situation. Taking care of the vulvovaginal tissue is easier than we want it to be. Less is more. So much more. Good, simple vulvovaginal hygiene means better flora (not fauna), reduced odor, less itching and less discomfort. So cheers to you and your polite vagina. That little number was raised right! -Bing Neighbors. Sauder PT, DPT

## 2020-03-20 ENCOUNTER — Other Ambulatory Visit: Payer: Self-pay

## 2020-03-20 ENCOUNTER — Ambulatory Visit: Payer: 59 | Admitting: Physical Therapy

## 2020-03-20 ENCOUNTER — Encounter: Payer: Self-pay | Admitting: Physical Therapy

## 2020-03-20 DIAGNOSIS — M545 Low back pain, unspecified: Secondary | ICD-10-CM

## 2020-03-20 DIAGNOSIS — M25511 Pain in right shoulder: Secondary | ICD-10-CM | POA: Diagnosis not present

## 2020-03-20 DIAGNOSIS — M62838 Other muscle spasm: Secondary | ICD-10-CM | POA: Diagnosis not present

## 2020-03-20 DIAGNOSIS — M25512 Pain in left shoulder: Secondary | ICD-10-CM | POA: Diagnosis not present

## 2020-03-20 DIAGNOSIS — M6281 Muscle weakness (generalized): Secondary | ICD-10-CM | POA: Diagnosis not present

## 2020-03-20 DIAGNOSIS — G8929 Other chronic pain: Secondary | ICD-10-CM | POA: Diagnosis not present

## 2020-03-20 NOTE — Therapy (Signed)
[place] MAIN [facility] SERVICES [address] [place], [place], [address] Phone: [phone]   Fax:  [phone]  Physical Therapy Treatment  Patient Details  Name: Sara Perry MRN: [id number] Date of Birth: 1961-04-22 Referring Provider (PT): Dr. Jovita Gamma   Encounter Date: 03/20/2020  PT End of Session - 03/20/20 0945    Visit Number  11    Number of Visits  20    Date for PT Re-Evaluation  04/16/20    Authorization Type  [employer]    Authorization Time Period  02/15/20-04/16/20 (returns to work 6/7)    PT Start Time  0935    PT Stop Time  1015    PT Time Calculation (min)  40 min    Activity Tolerance  Patient tolerated treatment well;No increased pain    Behavior During Therapy  WFL for tasks assessed/performed       Past Medical History:  Diagnosis Date  . Complication of anesthesia    pt states she is very sensitive to most drugs, usually needs 1/2 of whatever other people get. She states as a child/teenager she had difficulty waking up after surgery due to the sensitivity that she has to drugs.  . Eczema   . Frequent UTI   . H/O lymphadenopathy    With previous submandibular node removals.  . Hypertension   . Recurrent sinus infections   . Sleep apnea    uses c-pap    Past Surgical History:  Procedure Laterality Date  . COLONOSCOPY WITH PROPOFOL N/A 08/25/2017   Procedure: COLONOSCOPY WITH PROPOFOL;  Surgeon: Lucilla Lame, MD;  Location: [facility] ENDOSCOPY;  Service: Endoscopy;  Laterality: N/A;  . Lymph Node Removal  1984  . NECK SURGERY  1977   H/O Right neck surgery secondary to a benign growth with when patient was 37 or 59 yo  . skin lesion extraction  2013   basal cell - Dr Phillip Heal  . Uterine Polyps Removed  2004    There were no vitals filed for this visit.  Subjective Assessment - 03/20/20 0939    Subjective  Patient went back to work yesterday and states having increased tightness/soreness after working  especially on right side;    Pertinent History  Has done PT in past for LBP as well as shoulder pain.    How long can you sit comfortably?  Up to 30 minutes    How long can you stand comfortably?  Less than 45 minutes    How long can you walk comfortably?  Has been walking 2-3x daily for 45-60 minutes (tolerated well)    Currently in Pain?  Yes    Pain Score  2     Pain Location  Back    Pain Orientation  Right;Lower    Pain Descriptors / Indicators  Aching;Sore    Pain Type  Chronic pain    Pain Onset  More than a month ago    Pain Frequency  Intermittent    Aggravating Factors   certain movement    Pain Relieving Factors  walking/stretch    Effect of Pain on Daily Activities  guarded activity;    Multiple Pain Sites  No         TREATMENT: Patient hooklying with moist heat to low back: Posterior pelvic tilt 5 sec hold x10 reps; Posterior pelvic rock with:  Alternate UE/LE lift 2# handweight and ankle weight x10 reps each side; reports increased discomfort when lifting right leg and  left arm; Patient able to exhibit good pelvic control with minimal lumbar extension;    PT assessed RLE SI joint with long sit test with RLE slightly long to short indicating possible anterior rotation  Patient in left sidelying: PT performed grade II-III AP mobs (counterclockwise) mobs to right pelvis 15 sec bouts x3 sets;  PT instructed patient in isometric right hip abduction clamshells 5 sec hold x5 reps with moderate fatigue reported but no pain;  Patient supine: PT instructed patient in mobilization with movement: Isometric RLE hip extension into therapist shoulder 5 sec hold followed by therapist performing grade II-III AP mobs to right ASIS/ischial tuberosity for pelvic rotation 10 sec bout x5 reps;  Following manual therapy repeated long sit test with neutral position exhibited;  Patient transitioned to prone: PT assessed thoracolumbar paraspinals; Increased tightness noted;  PT  performed soft/deep tissue massage utilizing edge tool for better tissue extensibility x8 min; Tolerated well with less tightness on right side, but has continued paraspinal tightness on left side.   Patient tolerated session well;  She reports less SI joint discomfort following isometric/mobilization activities. Patient did exhibit increased tightness in thoracolumbar paraspinals which was  relieved with manual therapy; Reinforced HEP                       PT Education - 03/20/20 1111    Education Details  lumbar stretches/ROM, strengthening, HEP    Person(s) Educated  Patient    Methods  Explanation;Verbal cues    Comprehension  Verbalized understanding;Returned demonstration;Verbal cues required;Need further instruction       PT Short Term Goals - 03/15/20 0924      PT SHORT TERM GOAL #1   Title  After 4 weeks pt to report full indepdence in HEP for progressing strength and activity tolerance.    Baseline  no HEP    Time  4    Period  Weeks    Status  Achieved    Target Date  03/14/20        PT Long Term Goals - 03/20/20 1003      PT LONG TERM GOAL #1   Title  After 6 weeks pt to report full tolerance of return to FT work, ability to perform all work duties.    Baseline  still on FMLA    Time  6    Period  Weeks    Status  On-going    Target Date  03/28/20      PT LONG TERM GOAL #2   Title  Pt. will have returned to Gym activities without increased pain >2 points to demonstrate implementation of safety recommendations and use of appropriate form.    Baseline  machine based and aerobic programs formerly.    Time  6    Period  Weeks    Status  Revised    Target Date  04/16/20      PT LONG TERM GOAL #3   Title  Patient will be independent with HEP to continue benefits of therapy until after discharge.    Baseline  Pt. independent with all exercises provided to date.    Time  6    Period  Weeks    Status  Achieved    Target Date  03/28/20       PT LONG TERM GOAL #4   Title  Pt. will report no leakage of urine, urinary retention, or straining with BM to demonstrate improved control and strength of the  PFM    Baseline  Pt. having SUI with sneeze, feeling of incomplte bladder emptying and UUI mostly first thing in the morning.    Time  5    Period  Weeks    Status  New    Target Date  04/16/20            Plan - 03/20/20 1112    Clinical Impression Statement  Patient motivated and participated well within session. she responds well to manual therapy with significant reduction in thoracolumbar paraspinal tightness as well as improved right SI joint/pelvic mobility. Patient reports less pain at end of session. She was instructed in advanced core strengthening exercises. Reinforced HEP with instruction in proper hip/back stretches. Patient would benefit from additional skilled PT intervention to improve strength, ROM and reduce pain with ADLs.    Personal Factors and Comorbidities  Age;Education;Behavior Pattern;Fitness;Past/Current Experience;Profession;Time since onset of injury/illness/exacerbation    Examination-Activity Limitations  Squat;Carry;Continence;Stairs;Stand;Transfers    Examination-Participation Restrictions  Driving;Community Activity;Cleaning;Laundry;Interpersonal Relationship    Stability/Clinical Decision Making  Stable/Uncomplicated    Rehab Potential  Excellent    Clinical Impairments Affecting Rehab Potential  (+) highly active (+) fitness    PT Frequency  2x / week    PT Duration  6 weeks    PT Treatment/Interventions  Manual techniques;Neuromuscular re-education;Patient/family education;Therapeutic exercise;Therapeutic activities;Iontophoresis 4mg /ml Dexamethasone;Moist Heat;Cryotherapy;Electrical Stimulation;Ultrasound;Passive range of motion;Dry needling;Joint Manipulations;Scar mobilization    PT Next Visit Plan  Review HEP and expand as needed; detailed MMT if desired    PT Home Exercise Plan  See  education section    Consulted and Agree with Plan of Care  Patient       Patient will benefit from skilled therapeutic intervention in order to improve the following deficits and impairments:  Decreased range of motion, Decreased endurance, Decreased strength, Decreased balance, Pain, Decreased coordination, Decreased mobility, Decreased activity tolerance, Increased fascial restricitons  Visit Diagnosis: Acute midline low back pain without sciatica  Muscle weakness (generalized)  Other muscle spasm     Problem List Patient Active Problem List   Diagnosis Date Noted  . Lumbar stenosis with neurogenic claudication 12/26/2019  . Hyperbilirubinemia 12/14/2019  . Eye injury 05/26/2019  . Left shoulder pain 05/23/2018  . Snoring 05/23/2018  . Benign neoplasm of descending colon   . Benign neoplasm of ascending colon   . Vaginal atrophy 03/22/2017  . Meniere's disease 08/17/2016  . Colon cancer screening 08/11/2015  . Fatigue 06/03/2015  . Health care maintenance 06/03/2015  . Neck pain 02/05/2014  . Hypercholesterolemia 02/05/2014  . Back pain 12/05/2012  . Trigger finger 12/05/2012  . Essential hypertension, benign 12/05/2012    Delrose Rohwer PT, DPT 03/20/2020, 11:55 AM  [place] MAIN [facility] SERVICES [address] [place], [place], [address] Phone: [phone]   Fax:  [phone]  Name: Aliviyah Malanga MRN: [id number] Date of Birth: [birth year]

## 2020-03-21 ENCOUNTER — Other Ambulatory Visit: Payer: Self-pay

## 2020-03-21 ENCOUNTER — Ambulatory Visit: Payer: 59

## 2020-03-21 DIAGNOSIS — M25511 Pain in right shoulder: Secondary | ICD-10-CM | POA: Diagnosis not present

## 2020-03-21 DIAGNOSIS — M25512 Pain in left shoulder: Secondary | ICD-10-CM | POA: Diagnosis not present

## 2020-03-21 DIAGNOSIS — M62838 Other muscle spasm: Secondary | ICD-10-CM | POA: Diagnosis not present

## 2020-03-21 DIAGNOSIS — G8929 Other chronic pain: Secondary | ICD-10-CM | POA: Diagnosis not present

## 2020-03-21 DIAGNOSIS — M6281 Muscle weakness (generalized): Secondary | ICD-10-CM

## 2020-03-21 DIAGNOSIS — M545 Low back pain, unspecified: Secondary | ICD-10-CM

## 2020-03-21 NOTE — Therapy (Signed)
[place] MAIN [facility] SERVICES [address] [place] [employer] [place], [place], [address] Phone: [phone]   Fax:  [phone]  Physical Therapy Treatment  The patient has been informed of current processes in place at Outpatient Rehab to protect patients from Covid-19 exposure including social distancing, schedule modifications, and new cleaning procedures. After discussing their particular risk with a therapist based on the patient's personal risk factors, the patient has decided to proceed with in-person therapy.   Patient Details  Name: Sara Perry MRN: [id number] Date of Birth: [birth year] Referring Provider (PT): Dr. Jovita Gamma   Encounter Date: 03/21/2020   PT End of Session - 03/22/20 0843    Visit Number 12    Number of Visits 20    Date for PT Re-Evaluation 04/16/20    Authorization Type [employer]    Authorization Time Period 02/15/20-04/16/20 (returns to work 6/7)    PT Start Time 1735    PT Stop Time 1835    PT Time Calculation (min) 60 min    Activity Tolerance Patient tolerated treatment well;No increased pain    Behavior During Therapy WFL for tasks assessed/performed           Past Medical History:  Diagnosis Date  . Complication of anesthesia    pt states she is very sensitive to most drugs, usually needs 1/2 of whatever other people get. She states as a child/teenager she had difficulty waking up after surgery due to the sensitivity that she has to drugs.  . Eczema   . Frequent UTI   . H/O lymphadenopathy    With previous submandibular node removals.  . Hypertension   . Recurrent sinus infections   . Sleep apnea    uses c-pap    Past Surgical History:  Procedure Laterality Date  . COLONOSCOPY WITH PROPOFOL N/A 08/25/2017   Procedure: COLONOSCOPY WITH PROPOFOL;  Surgeon: Lucilla Lame, MD;  Location: [facility] ENDOSCOPY;  Service: Endoscopy;  Laterality: N/A;  . Lymph Node Removal  1984  . NECK SURGERY  1977   H/O Right neck  surgery secondary to a benign growth with when patient was 60 or 59 yo  . skin lesion extraction  2013   basal cell - Dr Phillip Heal  . Uterine Polyps Removed  2004    There were no vitals filed for this visit.   Pelvic Floor Physical Therapy Treatment Note  SCREENING  Changes in medications, allergies, or medical history?: none    SUBJECTIVE  Patient reports: Has not had any leakage with cough/sneeze since Friday's appointment. Feels like she is emptying more fully some of the time. Still getting strong urge to urinate in the morning and throughout the day. Still feels a little bit easier to have a BM but has not gotten her squatty-potty yet. Did get her internal release tool but forgot to bring it with her. Starts  Getting discomfort from pressure on her low abdomen sometimes in the later part of the day that makes her think she has to urinate but not much comes out.   Precautions:  L4-5 fusion, menopausal  Pain update:  Location of pain: T-L junction and SIJ region Current pain:  3/10  Max pain:  4/10 Least pain:  0/10 Nature of pain: pinch/burning/grabbing  **Pt. Reports less tenderness, some dull achy/soreness remaining following treatment on the R>L side around paraspinals.   Patient Goals: Decreased back pain, increased ability to have a BM without straining, decreased SUI and urinary frequency.   OBJECTIVE  Changes in: Posture/Observations:  R PSIS and ASIS slightly high  Special tests:  LLD: RLE measured ~ 1/2 cm long  Range of Motion/Flexibilty:  Forward bend: Fingers ~ 2 feet from floor   **following treatment ~ 12 in. From floor  Strength/MMT:  LE MMT:   Pelvic Floor External Exam: (from previous session) Introitus Appears: WNL Skin integrity: WNL Palpation: no TTP externally Cough: intact Prolapse visible?: no Scar mobility: N/A  Internal Vaginal Exam: Strength (PERF): 4/5, 5 seconds, 2 times. Symmetry: R>L for TTP Palpation: TTP throughout  but on R>L Prolapse: anterior wall visible ~ 1/2 cm above the introitus with bearing down.  Abdominal:   Palpation: TTP to B paraspinals and L multifidus near the T/L junction.  Gait Analysis:  INTERVENTIONS THIS SESSION: Manual: Performed TP release and STM to B paraspinals and L multifidus near the T-L junction on L to decrease spasm and pain and allow for improved balance of musculature for improved function and decreased symptoms.  Dry-needle: Performed TPDN with a .30x17mm needle and standard approach as described below to decrease spasm and pain and allow for improved balance of musculature for improved function and decreased symptoms.    Total time: 60 min.                              PT Short Term Goals - 03/15/20 3716      PT SHORT TERM GOAL #1   Title After 4 weeks pt to report full indepdence in HEP for progressing strength and activity tolerance.    Baseline no HEP    Time 4    Period Weeks    Status Achieved    Target Date 03/14/20             PT Long Term Goals - 03/20/20 1003      PT LONG TERM GOAL #1   Title After 6 weeks pt to report full tolerance of return to FT work, ability to perform all work duties.    Baseline still on FMLA    Time 6    Period Weeks    Status On-going    Target Date 03/28/20      PT LONG TERM GOAL #2   Title Pt. will have returned to Gym activities without increased pain >2 points to demonstrate implementation of safety recommendations and use of appropriate form.    Baseline machine based and aerobic programs formerly.    Time 6    Period Weeks    Status Revised    Target Date 04/16/20      PT LONG TERM GOAL #3   Title Patient will be independent with HEP to continue benefits of therapy until after discharge.    Baseline Pt. independent with all exercises provided to date.    Time 6    Period Weeks    Status Achieved    Target Date 03/28/20      PT LONG TERM GOAL #4   Title Pt. will  report no leakage of urine, urinary retention, or straining with BM to demonstrate improved control and strength of the PFM    Baseline Pt. having SUI with sneeze, feeling of incomplte bladder emptying and UUI mostly first thing in the morning.    Time 5    Period Weeks    Status New    Target Date 04/16/20  Plan - 03/22/20 0844    Clinical Impression Statement Pt responded well to all treatment today, demonstrating an increase in her forward bend ROM from fingers ~ 24 in. from the floor to ~ 12 in. from the floor and less pain/more stretch end-feel. She demonstrated understanding of recommendations to minimize most-treatment soreness and will bring internal release tool to next visit for training on how to release and strengthen her PFM for further improved pelvic symptoms. She would benefit from more thoracic mobility especially near the T/L junction to allow for improved ability to maintain good posture following significant alterization to her lumbar curvature.    Personal Factors and Comorbidities Age;Education;Behavior Pattern;Fitness;Past/Current Experience;Profession;Time since onset of injury/illness/exacerbation    Examination-Activity Limitations Squat;Carry;Continence;Stairs;Stand;Transfers    Examination-Participation Restrictions Driving;Community Activity;Cleaning;Laundry;Interpersonal Relationship    Stability/Clinical Decision Making Stable/Uncomplicated    Rehab Potential Excellent    Clinical Impairments Affecting Rehab Potential (+) highly active (+) fitness    PT Frequency 2x / week    PT Duration 6 weeks    PT Treatment/Interventions Manual techniques;Neuromuscular re-education;Patient/family education;Therapeutic exercise;Therapeutic activities;Iontophoresis 4mg /ml Dexamethasone;Moist Heat;Cryotherapy;Electrical Stimulation;Ultrasound;Passive range of motion;Dry needling;Joint Manipulations;Scar mobilization    PT Next Visit Plan Review HEP and expand  as needed; detailed MMT if desired    PT Home Exercise Plan See education section    Consulted and Agree with Plan of Care Patient           Patient will benefit from skilled therapeutic intervention in order to improve the following deficits and impairments:  Decreased range of motion, Decreased endurance, Decreased strength, Decreased balance, Pain, Decreased coordination, Decreased mobility, Decreased activity tolerance, Increased fascial restricitons  Visit Diagnosis: Acute midline low back pain without sciatica  Muscle weakness (generalized)  Other muscle spasm     Problem List Patient Active Problem List   Diagnosis Date Noted  . Lumbar stenosis with neurogenic claudication 12/26/2019  . Hyperbilirubinemia 12/14/2019  . Eye injury 05/26/2019  . Left shoulder pain 05/23/2018  . Snoring 05/23/2018  . Benign neoplasm of descending colon   . Benign neoplasm of ascending colon   . Vaginal atrophy 03/22/2017  . Meniere's disease 08/17/2016  . Colon cancer screening 08/11/2015  . Fatigue 06/03/2015  . Health care maintenance 06/03/2015  . Neck pain 02/05/2014  . Hypercholesterolemia 02/05/2014  . Back pain 12/05/2012  . Trigger finger 12/05/2012  . Essential hypertension, benign 12/05/2012   Willa Rough DPT, ATC Willa Rough 03/22/2020, 8:56 AM  [place] MAIN [facility] SERVICES [address] [place], [place], [address] Phone: [phone]   Fax:  [phone]  Name: Alfonso Shackett MRN: [id number] Date of Birth: [birth year]

## 2020-03-22 ENCOUNTER — Ambulatory Visit: Payer: 59 | Admitting: Physical Therapy

## 2020-03-27 ENCOUNTER — Other Ambulatory Visit: Payer: Self-pay

## 2020-03-27 ENCOUNTER — Encounter: Payer: Self-pay | Admitting: Physical Therapy

## 2020-03-27 ENCOUNTER — Ambulatory Visit: Payer: 59 | Admitting: Physical Therapy

## 2020-03-27 DIAGNOSIS — M6281 Muscle weakness (generalized): Secondary | ICD-10-CM

## 2020-03-27 DIAGNOSIS — M62838 Other muscle spasm: Secondary | ICD-10-CM | POA: Diagnosis not present

## 2020-03-27 DIAGNOSIS — M25512 Pain in left shoulder: Secondary | ICD-10-CM | POA: Diagnosis not present

## 2020-03-27 DIAGNOSIS — G8929 Other chronic pain: Secondary | ICD-10-CM | POA: Diagnosis not present

## 2020-03-27 DIAGNOSIS — M25511 Pain in right shoulder: Secondary | ICD-10-CM | POA: Diagnosis not present

## 2020-03-27 DIAGNOSIS — M545 Low back pain, unspecified: Secondary | ICD-10-CM

## 2020-03-27 NOTE — Therapy (Signed)
[place] MAIN [facility] SERVICES [address] [place], [place], [address] Phone: [phone]   Fax:  [phone]  Physical Therapy Treatment  Patient Details  Name: Sara Perry MRN: [id number] Date of Birth: [birth year] Referring Provider (PT): Dr. Jovita Gamma   Encounter Date: 03/27/2020   PT End of Session - 03/27/20 1008    Visit Number 13    Number of Visits 20    Date for PT Re-Evaluation 04/16/20    Authorization Type [employer]    Authorization Time Period 02/15/20-04/16/20 (returns to work 6/7)    PT Start Time 0933    PT Stop Time 1015    PT Time Calculation (min) 42 min    Activity Tolerance Patient tolerated treatment well;No increased pain    Behavior During Therapy WFL for tasks assessed/performed           Past Medical History:  Diagnosis Date   Complication of anesthesia    pt states she is very sensitive to most drugs, usually needs 1/2 of whatever other people get. She states as a child/teenager she had difficulty waking up after surgery due to the sensitivity that she has to drugs.   Eczema    Frequent UTI    H/O lymphadenopathy    With previous submandibular node removals.   Hypertension    Recurrent sinus infections    Sleep apnea    uses c-pap    Past Surgical History:  Procedure Laterality Date   COLONOSCOPY WITH PROPOFOL N/A 08/25/2017   Procedure: COLONOSCOPY WITH PROPOFOL;  Surgeon: Lucilla Lame, MD;  Location: [facility] ENDOSCOPY;  Service: Endoscopy;  Laterality: N/A;   Lymph Node Removal  [place]   H/O Right neck surgery secondary to a benign growth with when patient was 60 or 59 yo   skin lesion extraction  2013   basal cell - Dr Phillip Heal   Uterine Polyps Removed  2004    There were no vitals filed for this visit.   Subjective Assessment - 03/27/20 0935    Subjective Patient worked over the weekend. She reports some soreness, but not too bad. She did do a lot of  computer work on Sunday and states that she had to get up and move around; She is stretching and walking daily which is helping;    Pertinent History Has done PT in past for LBP as well as shoulder pain.    How long can you sit comfortably? Up to 30 minutes    How long can you stand comfortably? Less than 45 minutes    How long can you walk comfortably? Has been walking 2-3x daily for 45-60 minutes (tolerated well)    Currently in Pain? Yes    Pain Score 2     Pain Location Back    Pain Orientation Lower    Pain Onset More than a month ago              TREATMENT: Patient prone: PT assessed thoracolumbar paraspinals with significant tightness noted on left side  PT performed soft/deept tissue massage to left thoracolumbar paraspinals utilizing edge tool for IASTM x15 min; Patient also exhibits trigger point along left thoracic paraspinals along T6 level, PT performed ischemic trigger point release 30 sec hold x2 reps;  PT also performed grade II-III PA mobs at T11, T12, L1 spinal segments, 10 sec bouts x4 sets each; Initially patient exhibit increased hypomobility at T12/L1 which was improved with increased  joint mobilizations; Patient does report slight discomfort with central PA mobs but denies any pain;   Following manual therapy:  Patient prone: Alternate LE hip extension x8 reps each LE, patient able to exhibit better multifidi with less pain lifting RLE following manual therapy. However with increased repetition she does have increased difficulty lifting LLE due to weakness in right lumbar paraspinals.   Qped: Cat/Camel stretch 5 sec hold x5 reps, good ROM noted;  Alternate UE/LE lift x5 reps; patient able to exhibit better trunk control with minimal trunk rotation during exercise. She does fatigue after 5 repetition;  Child pose x2 min;  Patient tolerated session well; She reports less discomfort and significant reduction in tightness at end of session. She does report  slight tightness in right lumbar paraspinals with advanced exercise. She responds well to manual therapy with reduction in stiffness and hypomobility.                    PT Education - 03/27/20 1405    Education Details lumbar ROM/exercise, HEP    Person(s) Educated Patient    Methods Explanation;Verbal cues    Comprehension Verbalized understanding;Returned demonstration;Verbal cues required;Need further instruction            PT Short Term Goals - 03/15/20 0924      PT SHORT TERM GOAL #1   Title After 4 weeks pt to report full indepdence in HEP for progressing strength and activity tolerance.    Baseline no HEP    Time 4    Period Weeks    Status Achieved    Target Date 03/14/20             PT Long Term Goals - 03/20/20 1003      PT LONG TERM GOAL #1   Title After 6 weeks pt to report full tolerance of return to FT work, ability to perform all work duties.    Baseline still on FMLA    Time 6    Period Weeks    Status On-going    Target Date 03/28/20      PT LONG TERM GOAL #2   Title Pt. will have returned to Gym activities without increased pain >2 points to demonstrate implementation of safety recommendations and use of appropriate form.    Baseline machine based and aerobic programs formerly.    Time 6    Period Weeks    Status Revised    Target Date 04/16/20      PT LONG TERM GOAL #3   Title Patient will be independent with HEP to continue benefits of therapy until after discharge.    Baseline Pt. independent with all exercises provided to date.    Time 6    Period Weeks    Status Achieved    Target Date 03/28/20      PT LONG TERM GOAL #4   Title Pt. will report no leakage of urine, urinary retention, or straining with BM to demonstrate improved control and strength of the PFM    Baseline Pt. having SUI with sneeze, feeling of incomplte bladder emptying and UUI mostly first thing in the morning.    Time 5    Period Weeks    Status New     Target Date 04/16/20                 Plan - 03/27/20 1405    Clinical Impression Statement Patient motivated and participated well within session. She does exhibit increased  tightness in left thoracolumbar paraspinals which was relieved with soft tissue massage. Patient does exhibit increased hypomobility at T12/L1 joint which was relieved with joint mobilizations. Following manual therapy she was able to exhibit better lumbar activation and improved stabilization with prone hip extension. Patient instructed in advanced back/core strengthening. She was able to exhibit better control with less instability this session. Patient would benefit from additional skilled PT Intervention to improve lumbar strength, ROM and reduce pain with ADLs.    Personal Factors and Comorbidities Age;Education;Behavior Pattern;Fitness;Past/Current Experience;Profession;Time since onset of injury/illness/exacerbation    Examination-Activity Limitations Squat;Carry;Continence;Stairs;Stand;Transfers    Examination-Participation Restrictions Driving;Community Activity;Cleaning;Laundry;Interpersonal Relationship    Stability/Clinical Decision Making Stable/Uncomplicated    Rehab Potential Excellent    Clinical Impairments Affecting Rehab Potential (+) highly active (+) fitness    PT Frequency 2x / week    PT Duration 6 weeks    PT Treatment/Interventions Manual techniques;Neuromuscular re-education;Patient/family education;Therapeutic exercise;Therapeutic activities;Iontophoresis 4mg /ml Dexamethasone;Moist Heat;Cryotherapy;Electrical Stimulation;Ultrasound;Passive range of motion;Dry needling;Joint Manipulations;Scar mobilization    PT Next Visit Plan Review HEP and expand as needed; detailed MMT if desired    PT Home Exercise Plan See education section    Consulted and Agree with Plan of Care Patient           Patient will benefit from skilled therapeutic intervention in order to improve the following deficits  and impairments:  Decreased range of motion, Decreased endurance, Decreased strength, Decreased balance, Pain, Decreased coordination, Decreased mobility, Decreased activity tolerance, Increased fascial restricitons  Visit Diagnosis: Acute midline low back pain without sciatica  Muscle weakness (generalized)  Other muscle spasm     Problem List Patient Active Problem List   Diagnosis Date Noted   Lumbar stenosis with neurogenic claudication 12/26/2019   Hyperbilirubinemia 12/14/2019   Eye injury 05/26/2019   Left shoulder pain 05/23/2018   Snoring 05/23/2018   Benign neoplasm of descending colon    Benign neoplasm of ascending colon    Vaginal atrophy 03/22/2017   Meniere's disease 08/17/2016   Colon cancer screening 08/11/2015   Fatigue 06/03/2015   Health care maintenance 06/03/2015   Neck pain 02/05/2014   Hypercholesterolemia 02/05/2014   Back pain 12/05/2012   Trigger finger 12/05/2012   Essential hypertension, benign 12/05/2012    Maevis Mumby PT, DPT 03/27/2020, 2:16 PM  [place] [address] [place], [place], [address] Phone: [phone]   Fax:  [phone]  Name: Jaedynn Bohlken MRN: [id number] Date of Birth: 1961/05/14

## 2020-03-28 ENCOUNTER — Other Ambulatory Visit: Payer: Self-pay

## 2020-03-28 ENCOUNTER — Ambulatory Visit: Payer: 59

## 2020-03-28 DIAGNOSIS — M62838 Other muscle spasm: Secondary | ICD-10-CM

## 2020-03-28 DIAGNOSIS — G8929 Other chronic pain: Secondary | ICD-10-CM | POA: Diagnosis not present

## 2020-03-28 DIAGNOSIS — M25512 Pain in left shoulder: Secondary | ICD-10-CM | POA: Diagnosis not present

## 2020-03-28 DIAGNOSIS — M25511 Pain in right shoulder: Secondary | ICD-10-CM | POA: Diagnosis not present

## 2020-03-28 DIAGNOSIS — M545 Low back pain, unspecified: Secondary | ICD-10-CM

## 2020-03-28 DIAGNOSIS — M6281 Muscle weakness (generalized): Secondary | ICD-10-CM

## 2020-03-28 NOTE — Patient Instructions (Addendum)
Urge supression technique:  1) Take a deep breath to convince yourself that you are in control and calm the nervous system.  2) Do 5 "quick-flick" kegels (pelvic floor muscle contractions) and re-assess the urge. Repeat another set if urge is still present. 3) Once the urge has decreased, start walking calmly to the the bathroom. Stop and repeat steps 1 and 2 as many times as needed until you can successfully get to the toilet. 4) Only once seated, take a deep breath and allow the pelvic floor muscles to relax and allow for the urine to flow.    Do not be discouraged if you are not successful the first couple times, this is normal and it will take practice but remember that YOU are in control. Start by practicing this at home where you do not have to worry as much if there were to be an accident. Allowing yourself to get rushed or nervous puts the bladder back in control and will not allow the technique to work.  Toileting Techniques for Bowel Movements (Defecation) Using your belly (abdomen) and pelvic floor muscles to have a bowel movement is usually instinctive.  Sometimes people can have problems with these muscles and have to relearn proper defecation (emptying) techniques.  If you have weakness in your muscles, organs that are falling out, decreased sensation in your pelvis, or ignore your urge to go, you may find yourself straining to have a bowel movement.  You are straining if you are: . holding your breath or taking in a huge gulp of air and holding it  . keeping your lips and jaw tensed and closed tightly . turning red in the face because of excessive pushing or forcing . developing or worsening your  hemorrhoids . getting faint while pushing . not emptying completely and have to defecate many times a day  If you are straining, you are actually making it harder for yourself to have a bowel movement.  Many people find they are pulling up with the pelvic floor muscles and closing off  instead of opening the anus. Due to lack pelvic floor relaxation and coordination the abdominal muscles, one has to work harder to push the feces out.  Many people have never been taught how to defecate efficiently and effectively.  Notice what happens to your body when you are having a bowel movement.  While you are sitting on the toilet pay attention to the following areas: . Jaw and mouth position . Angle of your hips   . Whether your feet touch the ground or not . Arm placement  . Spine position . Waist . Belly tension . Anus (opening of the anal canal)  An Evacuation/Defecation Plan   Here are the 4 basic points:  1. Lean forward enough for your elbows to rest on your knees 2. Support your feet on the floor or use a low stool if your feet don't touch the floor  3. Push out your belly as if you have swallowed a beach ball--you should feel a widening of your waist 4. Open and relax your pelvic floor muscles, rather than tightening around the anus       The following conditions my require modifications to your toileting posture:  . If you have had surgery in the past that limits your back, hip, pelvic, knee or ankle flexibility . Constipation   Your healthcare practitioner may make the following additional suggestions and adjustments:  1) Sit on the toilet  a) Make sure your  feet are supported. b) Notice your hip angle and spine position--most people find it effective to lean forward or raise their knees, which can help the muscles around the anus to relax  c) When you lean forward, place your forearms on your thighs for support  2) Relax suggestions a) Breath deeply in through your nose and out slowly through your mouth as if you are smelling the flowers and blowing out the candles. b) To become aware of how to relax your muscles, contracting and releasing muscles can be helpful.  Pull your pelvic floor muscles in tightly by using the image of holding back gas, or closing  around the anus (visualize making a circle smaller) and lifting the anus up and in.  Then release the muscles and your anus should drop down and feel open. Repeat 5 times ending with the feeling of relaxation. c) Keep your pelvic floor muscles relaxed; let your belly bulge out. d) The digestive tract starts at the mouth and ends at the anal opening, so be sure to relax both ends of the tube.  Place your tongue on the roof of your mouth with your teeth separated.  This helps relax your mouth and will help to relax the anus at the same time.  3) Empty (defecation) a) Keep your pelvic floor and sphincter relaxed, then bulge your anal muscles.  Make the anal opening wide.  b) Stick your belly out as if you have swallowed a beach ball. c) Make your belly wall hard using your belly muscles while continuing to breathe. Doing this makes it easier to open your anus. d) Breath out and give a grunt (or try using other sounds such as ahhhh, shhhhh, ohhhh or grrrrrrr).  4) Finish a) As you finish your bowel movement, pull the pelvic floor muscles up and in.  This will leave your anus in the proper place rather than remaining pushed out and down. If you leave your anus pushed out and down, it will start to feel as though that is normal and give you incorrect signals about needing to have a bowel movement.  This meditation is from [employer], physical therapist and yoga instructor.  Patients, yoga students and pelvic health practitioners that I have shared this with have seemed to really enjoy it, find it easy to practice and teach, and report its usefulness! It involves 6 stages, using the acronym "AIRBAG" to help you remember! If you are sitting on the toilet, it is a good idea to place your feet up on some blocks so that your knees are slightly higher than your hips. This position can help enhance your PFMs to relax and allow for proper elimination, particularly for bowel movements. If you are standing during  urination, these toilet meditation stages of "AIRBAG" can still be performed: A = Awareness: become present and connected to your body as best as you can. A brief body scan from head to toe simply observing sensations that you may be experiencing, without judgement, both internally (interoceptive awareness) and externally (such as the sensations at the soles of the feet weight bearing on the supporting surface, or sensations at the thighs as they weight bear on the toilet seat, or how you are holding your arms, or any tension in the jaw or shoulders). You may include awareness of thoughts and emotions, without elaborating on a story or analyzing. I = Imagination: use your visualization skills to imagine your pelvic floor and the general area of attachments of the PFMs  to the inside of the front, sides and back of the pelvis, the tailbone and sacrum. Visualize where the bladder and bowel are positioned and imagine them emptying and that the PFMs spanning across the pelvic floor are healthy and functioning optimally. R = Release & Relax: let go of any tension in the PFMs as best as you can. Releasing and relaxing these muscles can sometimes be difficult for a variety of reasons. Sometimes 'trying too hard' to relax and let go creates even more tension. Be patient and compassionate towards yourself if you have trouble with this. Letting go often takes courage, trust, concentration and practise. B = Breathe: allow your natural breath pattern to emerge. Sometimes when we try to breathe, we create more tension that results in unnatural patterns that do not serve a relaxed state. As you quietly inhale, the belly will naturally protrude outwards or forward and the pelvic floor will descend. As you exhale, the belly and PFMs will return to their resting positions. During toileting, see if you can simply allow the quiet rhythm of the abdomino-pelvic diaphragmatic breath to happen on its own without trying to change it. A  = Allow: this 'allowing' stage is a little more than just releasing and relaxing or allowing the breath to happen on its own. See if you can really give yourself permission to trust that your body knows what to do and when to do it. Perhaps you feel the need to push gently (do not strain) or you feel like you want to take a deep breath, sigh out loud, lean forward, or place your feet in a different position. The more refined your awareness skills are, the more you can trust what feels right, and not always what you think you should do. G = Gratitude: I think it is a healthy practice to not only be completely present and mindful when toileting, but to also honour this sophisticated and truly complex function that our body does for [place] on a daily basis without [place] even asking it to. So each time you complete your toileting event, I invite you to send a little gratitude to your body and all its incredibly phenomenal parts to end your toilet meditation ! There is a FREE bonus feature of the full guided Toilet Meditation (with music and breathtaking cinematography) included along with the "Creating Pelvic Floor Health with PhysioYoga" practice sessions which consist of a combination of physical therapy based exercises and yoga methods targeted to optimize pelvic floor health. I also have a free brief segment of the meditation on my YouTube channel.  Self Internal Trigger Point Relief    1) Wash your hands and prop yourself up in a way where you can easily reach the vagina. You may wish to have a small hand-held mirror near by.  2) lubricate the tool and vaginal opening using a hypoallergenic lubricant such as "slippery-stuff".   3) Slowly and gently insert the tool into the vagina using deep breaths to allow relaxation of the muscles around the tool.  4) Avoiding the "12 o-clock" region near the urethra, gently use the handle of the tool like a lever to press the angled tip of the tool onto the wall of the  pelvic floor.   5) Move the tool to different areas of the pelvic floor and feel for areas that are tender called "trigger points". When you find one hold the tool still, applying just enough pressure to elicit mild discomfort and take deep belly breaths until  the discomfort subsides or decreases by at least 50%.   6) Repeat the process for any trigger points you find spending between 3-10 minutes on this per night until you do not find any more trigger points or you are told otherwise by your therapist..

## 2020-03-28 NOTE — Therapy (Signed)
[place] MAIN [facility] SERVICES [address] [place], [place], [address] Phone: [phone]   Fax:  [phone]  Physical Therapy Treatment  The patient has been informed of current processes in place at Outpatient Rehab to protect patients from Covid-19 exposure including social distancing, schedule modifications, and new cleaning procedures. After discussing their particular risk with a therapist based on the patient's personal risk factors, the patient has decided to proceed with in-person therapy.   Patient Details  Name: Sara Perry MRN: [id number] Date of Birth: 1961/05/27 Referring Provider (PT): Dr. Jovita Gamma   Encounter Date: 03/28/2020   PT End of Session - 03/28/20 1740    Visit Number 14    Number of Visits 20    Date for PT Re-Evaluation 04/16/20    Authorization Type [employer]    Authorization Time Period 02/15/20-04/16/20 (returns to work 6/7)    PT Start Time 10    PT Stop Time 1730    PT Time Calculation (min) 60 min    Activity Tolerance Patient tolerated treatment well;No increased pain    Behavior During Therapy WFL for tasks assessed/performed           Past Medical History:  Diagnosis Date  . Complication of anesthesia    pt states she is very sensitive to most drugs, usually needs 1/2 of whatever other people get. She states as a child/teenager she had difficulty waking up after surgery due to the sensitivity that she has to drugs.  . Eczema   . Frequent UTI   . H/O lymphadenopathy    With previous submandibular node removals.  . Hypertension   . Recurrent sinus infections   . Sleep apnea    uses c-pap    Past Surgical History:  Procedure Laterality Date  . COLONOSCOPY WITH PROPOFOL N/A 08/25/2017   Procedure: COLONOSCOPY WITH PROPOFOL;  Surgeon: Lucilla Lame, MD;  Location: [facility] END[facility]COPY;  Service: Endoscopy;  Laterality: N/A;  . Lymph Node Removal  1984  . NECK SURGERY  1977   H/O Right neck  surgery secondary to a benign growth with when patient was 46 or 59 yo  . skin lesion extraction  2013   basal cell - Dr Phillip Heal  . Uterine Polyps Removed  2004    There were no vitals filed for this visit.   Pelvic Floor Physical Therapy Treatment Note  SCREENING  Changes in medications, allergies, or medical history?: none    SUBJECTIVE  Patient reports: Has not had as many opportunities to do JIC peeing since being back at work and she is having a lot in her bladder. but does get a strong urge.    Precautions:  L4-5 fusion, menopausal  Pain update:  Location of pain: T-L junction and SIJ region Current pain:  2-310  Max pain:  3/10 Least pain:  0/10 Nature of pain: grabbing/achy/throbbing tender  **  Patient Goals: Decreased back pain, increased ability to have a BM without straining, decreased SUI and urinary frequency.   OBJECTIVE  Changes in: Posture/Observations:  R PSIS and ASIS slightly high (not re-assessed today)  Special tests:  LLD: RLE measured ~ 1/2 cm long (from prior visit)  Range of Motion/Flexibilty:   Strength/MMT:  LE MMT:   Pelvic Floor External Exam: (from previous session) Introitus Appears: WNL Skin integrity: WNL Palpation: no TTP externally Cough: intact Prolapse visible?: no Scar mobility: N/A  Internal Vaginal Exam: Strength (PERF): 4/5, 5 seconds, 2 times. Symmetry: R>L for  TTP Palpation: TTP throughout but on R>L Prolapse: anterior wall visible ~ 1/2 cm above the introitus with bearing down.  TODAY: Pt. Demonstrated mild TTP through muscles on the L and moderate TTP through R OI and posterior PR/PC/IC with less at R coccygeus and anterior PR/PC.   Abdominal:   Palpation: TTP to B paraspinals and L multifidus near the T/L junction.  Gait Analysis:  INTERVENTIONS THIS SESSION: Manual: re-assessed for spasms B and educated Pt. On how to perform self internal TP release while Pt. Attempted in real time to allow for  confidence and allow Pt. To perform effectively at home so more of her visits can focus on her back.   Self-care: educated on urge-suppression technique to help Pt. Have decreased fear of SUI and urgency when her bladder is full. Educated on toileting techniques to further improve her ability to have a BM and discussed her progress thus far and the remaining pelvic-health specific goals.    Total time: 60 min.                              PT Short Term Goals - 03/15/20 6283      PT SHORT TERM GOAL #1   Title After 4 weeks pt to report full indepdence in HEP for progressing strength and activity tolerance.    Baseline no HEP    Time 4    Period Weeks    Status Achieved    Target Date 03/14/20             PT Long Term Goals - 03/20/20 1003      PT LONG TERM GOAL #1   Title After 6 weeks pt to report full tolerance of return to FT work, ability to perform all work duties.    Baseline still on FMLA    Time 6    Period Weeks    Status On-going    Target Date 03/28/20      PT LONG TERM GOAL #2   Title Pt. will have returned to Gym activities without increased pain >2 points to demonstrate implementation of safety recommendations and use of appropriate form.    Baseline machine based and aerobic programs formerly.    Time 6    Period Weeks    Status Revised    Target Date 04/16/20      PT LONG TERM GOAL #3   Title Patient will be independent with HEP to continue benefits of therapy until after discharge.    Baseline Pt. independent with all exercises provided to date.    Time 6    Period Weeks    Status Achieved    Target Date 03/28/20      PT LONG TERM GOAL #4   Title Pt. will report no leakage of urine, urinary retention, or straining with BM to demonstrate improved control and strength of the PFM    Baseline Pt. having SUI with sneeze, feeling of incomplte bladder emptying and UUI mostly first thing in the morning.    Time 5    Period Weeks     Status New    Target Date 04/16/20                 Plan - 03/28/20 1744    Clinical Impression Statement Pt. responded well to all interventions today, demonstrating great improvement in her ability to urinate less frequently and not have leakage and improved consistency with BM's  and less straining. She continues to have some PFM spasms on the R>L which are not as tender as at previous visit but Pt. was able to demonstrate ability to perform self internal TP release to continue to decrease spasms as part of her HEP. She will continue to benefit from skilled PT to further reduced back and pelvic spasms/pain and build core stability to allow for long-term relief and prevent return of leakage, constipation, etc.    Personal Factors and Comorbidities Age;Education;Behavior Pattern;Fitness;Past/Current Experience;Profession;Time since onset of injury/illness/exacerbation    Examination-Activity Limitations Squat;Carry;Continence;Stairs;Stand;Transfers    Examination-Participation Restrictions Driving;Community Activity;Cleaning;Laundry;Interpersonal Relationship    Stability/Clinical Decision Making Stable/Uncomplicated    Rehab Potential Excellent    Clinical Impairments Affecting Rehab Potential (+) highly active (+) fitness    PT Frequency 2x / week    PT Duration 6 weeks    PT Treatment/Interventions Manual techniques;Neuromuscular re-education;Patient/family education;Therapeutic exercise;Therapeutic activities;Iontophoresis 4mg /ml Dexamethasone;Moist Heat;Cryotherapy;Electrical Stimulation;Ultrasound;Passive range of motion;Dry needling;Joint Manipulations;Scar mobilization    PT Next Visit Plan Review HEP and expand as needed; detailed MMT if desired    PT Home Exercise Plan --    Consulted and Agree with Plan of Care Patient           Patient will benefit from skilled therapeutic intervention in order to improve the following deficits and impairments:  Decreased range of motion,  Decreased endurance, Decreased strength, Decreased balance, Pain, Decreased coordination, Decreased mobility, Decreased activity tolerance, Increased fascial restricitons  Visit Diagnosis: Acute midline low back pain without sciatica  Muscle weakness (generalized)  Other muscle spasm     Problem List Patient Active Problem List   Diagnosis Date Noted  . Lumbar stenosis with neurogenic claudication 12/26/2019  . Hyperbilirubinemia 12/14/2019  . Eye injury 05/26/2019  . Left shoulder pain 05/23/2018  . Snoring 05/23/2018  . Benign neoplasm of descending colon   . Benign neoplasm of ascending colon   . Vaginal atrophy 03/22/2017  . Meniere's disease 08/17/2016  . Colon cancer screening 08/11/2015  . Fatigue 06/03/2015  . Health care maintenance 06/03/2015  . Neck pain 02/05/2014  . Hypercholesterolemia 02/05/2014  . Back pain 12/05/2012  . Trigger finger 12/05/2012  . Essential hypertension, benign 12/05/2012   Willa Rough DPT, ATC Willa Rough 03/28/2020, 5:54 PM  [place] MAIN [facility] SERVICES [address] [place], [place], [address] Phone: [phone]   Fax:  [phone]  Name: Nashira Mcglynn MRN: [id number] Date of Birth: 1961/05/20

## 2020-03-29 ENCOUNTER — Encounter: Payer: 59 | Admitting: Physical Therapy

## 2020-04-02 ENCOUNTER — Ambulatory Visit: Payer: 59

## 2020-04-02 ENCOUNTER — Other Ambulatory Visit: Payer: Self-pay

## 2020-04-02 DIAGNOSIS — M25512 Pain in left shoulder: Secondary | ICD-10-CM | POA: Diagnosis not present

## 2020-04-02 DIAGNOSIS — M6281 Muscle weakness (generalized): Secondary | ICD-10-CM | POA: Diagnosis not present

## 2020-04-02 DIAGNOSIS — M25511 Pain in right shoulder: Secondary | ICD-10-CM | POA: Diagnosis not present

## 2020-04-02 DIAGNOSIS — G8929 Other chronic pain: Secondary | ICD-10-CM | POA: Diagnosis not present

## 2020-04-02 DIAGNOSIS — M545 Low back pain, unspecified: Secondary | ICD-10-CM

## 2020-04-02 DIAGNOSIS — M62838 Other muscle spasm: Secondary | ICD-10-CM | POA: Diagnosis not present

## 2020-04-02 NOTE — Therapy (Signed)
[place] MAIN [facility] SERVICES [address] [place], [place], [address] Phone: [phone]   Fax:  [phone]  Physical Therapy Treatment  The patient has been informed of current processes in place at Outpatient Rehab to protect patients from Covid-19 exposure including social distancing, schedule modifications, and new cleaning procedures. After discussing their particular risk with a therapist based on the patient's personal risk factors, the patient has decided to proceed with in-person therapy.   Patient Details  Name: Sara Perry MRN: [id number] Date of Birth: [birth year] Referring Provider (PT): Dr. Jovita Gamma   Encounter Date: 04/02/2020   PT End of Session - 04/04/20 1129    Visit Number 15    Number of Visits 20    Date for PT Re-Evaluation 04/16/20    Authorization Type [employer]    Authorization Time Period 02/15/20-04/16/20 (returns to work 6/7)    PT Start Time 0735    PT Stop Time 0835    PT Time Calculation (min) 60 min    Activity Tolerance Patient tolerated treatment well;No increased pain    Behavior During Therapy WFL for tasks assessed/performed           Past Medical History:  Diagnosis Date  . Complication of anesthesia    pt states she is very sensitive to most drugs, usually needs 1/2 of whatever other people get. She states as a child/teenager she had difficulty waking up after surgery due to the sensitivity that she has to drugs.  . Eczema   . Frequent UTI   . H/O lymphadenopathy    With previous submandibular node removals.  . Hypertension   . Recurrent sinus infections   . Sleep apnea    uses c-pap    Past Surgical History:  Procedure Laterality Date  . COLONOSCOPY WITH PROPOFOL N/A 08/25/2017   Procedure: COLONOSCOPY WITH PROPOFOL;  Surgeon: Lucilla Lame, MD;  Location: [facility] ENDOSCOPY;  Service: Endoscopy;  Laterality: N/A;  . Lymph Node Removal  1984  . NECK SURGERY  1977   H/O Right neck  surgery secondary to a benign growth with when patient was 35 or 59 yo  . skin lesion extraction  2013   basal cell - Dr Phillip Heal  . Uterine Polyps Removed  2004    There were no vitals filed for this visit.   Pelvic Floor Physical Therapy Treatment Note  SCREENING  Changes in medications, allergies, or medical history?: none    SUBJECTIVE  Patient reports: Has mostly had some pain in the R posterior hip and in the low back. Was practicing her urge suppression some over the weekend and found that it did help, she was even able to use it after 2 hours in the car and being "triggered" by seeing the toilet and undoing her pants successfully.  Precautions:  L4-5 fusion, menopausal  Pain update:  Location of pain: T-L junction and SIJ region Current pain:  2-3/10  Max pain:  3/10 Least pain:  0/10 Nature of pain: grabbing/achy/throbbing tender  **  Patient Goals: Decreased back pain, increased ability to have a BM without straining, decreased SUI and urinary frequency.   OBJECTIVE  Changes in: Posture/Observations:  Slight R anterior rotation,   Special tests:  LLD: LLE long by ~ 1/2 cm (after obliquity corrected) Supine-to-long-sit: ankles even is supine, RLE short in sitting.  Range of Motion/Flexibilty:   Strength/MMT:  LE MMT:   Pelvic Floor External Exam: (from previous session) Introitus Appears: WNL Skin  integrity: WNL Palpation: no TTP externally Cough: intact Prolapse visible?: no Scar mobility: N/A  Internal Vaginal Exam: Strength (PERF): 4/5, 5 seconds, 2 times. Symmetry: R>L for TTP Palpation: TTP throughout but on R>L Prolapse: anterior wall visible ~ 1/2 cm above the introitus with bearing down.  Abdominal:   Palpation: TTP to B paraspinals and L multifidus near the T/L junction.  Gait Analysis:  INTERVENTIONS THIS SESSION: Manual: re-measured/assessed pelvic alignment. Performed TP release to R iliacus and MET correction x2 as well as  grade 3-4 PA sacral mobs to improve pelvic alignment. Re-measured LLD and gave 1/3 of adjust-a-lift heel lift for RLE to improve pelvic alignment in standing and decrease imbalance of pressures on the low back to allow for decreased pain long-term.  Self-care: educated on how to gradually increase wear-time with heel-lift to prevent increased SIJ pain during transition. Reviewed pelvic health goals and verified that Pt. Feels she has the tools needed to continue to improve with remaining Sx.  Therex: Educated on and practiced child's pose and kegel's to allow for both improved length and strength of the PFM to maintain improvement and continue to improve upon pelvic floor Sx.  Total time: 60 min.                              PT Short Term Goals - 03/15/20 6195      PT SHORT TERM GOAL #1   Title After 4 weeks pt to report full indepdence in HEP for progressing strength and activity tolerance.    Baseline no HEP    Time 4    Period Weeks    Status Achieved    Target Date 03/14/20             PT Long Term Goals - 04/04/20 1145      PT LONG TERM GOAL #1   Title After 6 weeks pt to report full tolerance of return to FT work, ability to perform all work duties.    Baseline still on FMLA    Time 6    Period Weeks    Status On-going    Target Date 04/07/20      PT LONG TERM GOAL #2   Title Pt. will have returned to Gym activities without increased pain >2 points to demonstrate implementation of safety recommendations and use of appropriate form.    Baseline machine based and aerobic programs formerly.    Time 6    Period Weeks    Status Revised    Target Date 04/16/20      PT LONG TERM GOAL #3   Title Patient will be independent with HEP to continue benefits of therapy until after discharge.    Baseline Pt. independent with all exercises provided to date.    Time 6    Period Weeks    Status Achieved      PT LONG TERM GOAL #4   Title Pt. will report  no leakage of urine, urinary retention, or straining with BM to demonstrate improved control and strength of the PFM    Baseline Pt. having SUI with sneeze, feeling of incomplte bladder emptying and UUI mostly first thing in the morning.    Time 5    Period Weeks    Status Achieved    Target Date 04/16/20                 Plan - 04/04/20 1130  Clinical Impression Statement Pt. responded well to all interventions today, demonstrating improved sacral mobility, decreased hip pain, following session and improved pelvic alignment with addition of 1/3 of a heel-lift in the R shoe. She demonstrated correct performance and understanding of all eduation and exercises and describes confidence that she can use what she has learned to continue to improve upon slight remaining pelvic Sx. She will continue to benefit from skilled PT To address remaining pain and tightness through the spine and deep-core weakness to continue to decrease pain and fatigue with long work-days.    Personal Factors and Comorbidities Age;Education;Behavior Pattern;Fitness;Past/Current Experience;Profession;Time since onset of injury/illness/exacerbation    Examination-Activity Limitations Squat;Carry;Continence;Stairs;Stand;Transfers    Examination-Participation Restrictions Driving;Community Activity;Cleaning;Laundry;Interpersonal Relationship    Stability/Clinical Decision Making Stable/Uncomplicated    Rehab Potential Excellent    Clinical Impairments Affecting Rehab Potential (+) highly active (+) fitness    PT Frequency 2x / week    PT Duration 6 weeks    PT Treatment/Interventions Manual techniques;Neuromuscular re-education;Patient/family education;Therapeutic exercise;Therapeutic activities;Iontophoresis 70m/ml Dexamethasone;Moist Heat;Cryotherapy;Electrical Stimulation;Ultrasound;Passive range of motion;Dry needling;Joint Manipulations;Scar mobilization    PT Next Visit Plan Review HEP and expand as needed; detailed  MMT if desired    PT Home Exercise Plan Added ~ 1/3 cm heel-lift in R shoe    Consulted and Agree with Plan of Care Patient           Patient will benefit from skilled therapeutic intervention in order to improve the following deficits and impairments:  Decreased range of motion, Decreased endurance, Decreased strength, Decreased balance, Pain, Decreased coordination, Decreased mobility, Decreased activity tolerance, Increased fascial restricitons  Visit Diagnosis: Acute midline low back pain without sciatica  Muscle weakness (generalized)  Other muscle spasm     Problem List Patient Active Problem List   Diagnosis Date Noted  . Lumbar stenosis with neurogenic claudication 12/26/2019  . Hyperbilirubinemia 12/14/2019  . Eye injury 05/26/2019  . Left shoulder pain 05/23/2018  . Snoring 05/23/2018  . Benign neoplasm of descending colon   . Benign neoplasm of ascending colon   . Vaginal atrophy 03/22/2017  . Meniere's disease 08/17/2016  . Colon cancer screening 08/11/2015  . Fatigue 06/03/2015  . Health care maintenance 06/03/2015  . Neck pain 02/05/2014  . Hypercholesterolemia 02/05/2014  . Back pain 12/05/2012  . Trigger finger 12/05/2012  . Essential hypertension, benign 12/05/2012   KWilla RoughDPT, ATC KWilla Rough6/23/2021, 11:56 AM  [place] [facility] [address] [address]Phone: [phone]  Fax:  [phone] Name: PLatesha ChesneyMRN: [id number] of Birth: 110/02/1961

## 2020-04-02 NOTE — Patient Instructions (Addendum)
Child's Pose Pelvic Floor Lengthening    Sit in knee-chest position and reach arms forward. Separate knees for comfort. Hold position for _5__ breaths. Repeat _2-3__ times. Do _1-2__ times per day.  Kegel exercises:    With neutral spine, tighten pelvic floor by imagining you are stopping the flow of urine, squeezing only around the vagina and anus.  Quick-Flicks: Pull up and in quickly and then relax allowing just enough time for the muscles to full lengthen before the next contraction. Do _10__ repetitions in a row, stopping if the pelvic floor muscle gets tired and other muscles try to take over.  Long-Holds: Hold for __5_ seconds and then fully release, repeat _5__ times. (only hold until you can feel the muscles "give up")  Repeat both of these exercises __3-5_ times throughout the day     If this is difficult, start by performing while lying down as shown. As you get stronger, you can try in sitting, standing, etc. Make sure that you are NOT contracting your buttocks or inner thigh muscles!  Tip: If you are having a hard time feeling the muscles, try using a hand-held mirror to observe the muscles lift and draw in or feel with your hand to make sure you are squeezing when you think you are squeezing, not bearing down. Some people find it helpful to pretend you are "picking blueberries" with the vagina to feel the drawing in or try to make the clitoris "nod" and pull your sitz bones in toward the middle.     As you start wearing your heel-lift only wear it for an hour the first day and increase by an hour each day so you can allow for the body to adapt to the change easily without much pain. If your pain increases by more than 1-2 points, back off slightly or slow down how quickly you increase your wear time. Once you reach a full day of wear, use it as much as possible forever, even in house-shoes or flip-flops if necessary to keep yourself from reverting to bad pelvic and spinal  alignment and having symptoms return.    Adjust-a-lift heel lift Can be found at [email or web address]

## 2020-04-03 ENCOUNTER — Encounter: Payer: 59 | Admitting: Physical Therapy

## 2020-04-05 ENCOUNTER — Other Ambulatory Visit: Payer: Self-pay

## 2020-04-05 ENCOUNTER — Ambulatory Visit: Payer: 59 | Admitting: Physical Therapy

## 2020-04-05 ENCOUNTER — Encounter: Payer: Self-pay | Admitting: Physical Therapy

## 2020-04-05 DIAGNOSIS — M545 Low back pain, unspecified: Secondary | ICD-10-CM

## 2020-04-05 DIAGNOSIS — D2262 Melanocytic nevi of left upper limb, including shoulder: Secondary | ICD-10-CM | POA: Diagnosis not present

## 2020-04-05 DIAGNOSIS — D2272 Melanocytic nevi of left lower limb, including hip: Secondary | ICD-10-CM | POA: Diagnosis not present

## 2020-04-05 DIAGNOSIS — M6281 Muscle weakness (generalized): Secondary | ICD-10-CM | POA: Diagnosis not present

## 2020-04-05 DIAGNOSIS — M25511 Pain in right shoulder: Secondary | ICD-10-CM | POA: Diagnosis not present

## 2020-04-05 DIAGNOSIS — L720 Epidermal cyst: Secondary | ICD-10-CM | POA: Diagnosis not present

## 2020-04-05 DIAGNOSIS — L821 Other seborrheic keratosis: Secondary | ICD-10-CM | POA: Diagnosis not present

## 2020-04-05 DIAGNOSIS — D2271 Melanocytic nevi of right lower limb, including hip: Secondary | ICD-10-CM | POA: Diagnosis not present

## 2020-04-05 DIAGNOSIS — M62838 Other muscle spasm: Secondary | ICD-10-CM | POA: Diagnosis not present

## 2020-04-05 DIAGNOSIS — G8929 Other chronic pain: Secondary | ICD-10-CM | POA: Diagnosis not present

## 2020-04-05 DIAGNOSIS — D225 Melanocytic nevi of trunk: Secondary | ICD-10-CM | POA: Diagnosis not present

## 2020-04-05 DIAGNOSIS — D2261 Melanocytic nevi of right upper limb, including shoulder: Secondary | ICD-10-CM | POA: Diagnosis not present

## 2020-04-05 DIAGNOSIS — M25512 Pain in left shoulder: Secondary | ICD-10-CM | POA: Diagnosis not present

## 2020-04-05 NOTE — Therapy (Signed)
[place] MAIN [facility] SERVICES [address] [place], [place], [address] Phone: [phone]   Fax:  [phone]  Physical Therapy Treatment  Patient Details  Name: Sara Perry MRN: [id number] Date of Birth: 23-Nov-1960 Referring Provider (PT): Dr. Jovita Gamma   Encounter Date: 04/05/2020   PT End of Session - 04/05/20 1146    Visit Number 16    Number of Visits 20    Date for PT Re-Evaluation 04/16/20    Authorization Type [employer]    Authorization Time Period 02/15/20-04/16/20 (returns to work 6/7)    PT Start Time 1146    PT Stop Time 1230    PT Time Calculation (min) 44 min    Activity Tolerance Patient tolerated treatment well;No increased pain    Behavior During Therapy WFL for tasks assessed/performed           Past Medical History:  Diagnosis Date  . Complication of anesthesia    pt states she is very sensitive to most drugs, usually needs 1/2 of whatever other people get. She states as a child/teenager she had difficulty waking up after surgery due to the sensitivity that she has to drugs.  . Eczema   . Frequent UTI   . H/O lymphadenopathy    With previous submandibular node removals.  . Hypertension   . Recurrent sinus infections   . Sleep apnea    uses c-pap    Past Surgical History:  Procedure Laterality Date  . COLONOSCOPY WITH PROPOFOL N/A 08/25/2017   Procedure: COLONOSCOPY WITH PROPOFOL;  Surgeon: Lucilla Lame, MD;  Location: [facility] ENDOSCOPY;  Service: Endoscopy;  Laterality: N/A;  . Lymph Node Removal  1984  . NECK SURGERY  1977   H/O Right neck surgery secondary to a benign growth with when patient was 89 or 59 yo  . skin lesion extraction  2013   basal cell - Dr Phillip Heal  . Uterine Polyps Removed  2004    There were no vitals filed for this visit.   Subjective Assessment - 04/05/20 1150    Subjective Patient reports getting a shoe insert (smaller and heel lift, gel pad) which she states is more  comfortable and is able to wear this insert for all day without difficulty;    Pertinent History Has done PT in past for LBP as well as shoulder pain.    How long can you sit comfortably? Up to 30 minutes    How long can you stand comfortably? Less than 45 minutes    How long can you walk comfortably? Has been walking 2-3x daily for 45-60 minutes (tolerated well)    Currently in Pain? Yes    Pain Score 1     Pain Onset More than a month ago                 TREATMENT: Patient prone: PT assessed thoracolumbar paraspinals with significant tightness noted on left side  PT performed soft/deept tissue massage to left thoracolumbar paraspinals utilizing edge tool for IASTM x15 min; Patient also exhibits trigger point along left thoracic paraspinals along T6 level, PT performed ischemic trigger point release 30 sec hold x2 reps; She also exhibits increased tightness in right thoracolumbar paraspinals. PT performed soft/deep tissue massage to right thoracolumbar paraspinals utilizing edge tool for IASTM x10 min with good tolerance; Patient able to exhibit better tissue mobility with less tightness following manual therapy;    Following manual therapy:  Patient prone: Alternate LE hip extension  x10 reps each LE, patient able to exhibit better multifidi with less pain lifting RLE following manual therapy. However with increased repetition she does have increased difficulty lifting LLE due to weakness in right lumbar paraspinals.  Gluteal max hip extension x10 reps each LE with min Vcs for proper positioning and exercise technique;   Qped: Child pose x2 min;  Patient tolerated session well; She reports less discomfort and significant reduction in tightness at end of session. She responds well to manual therapy with reduction in stiffness and hypomobility. reinforced HEP with instruction to increase repetition for better lumbar paraspinals strengthening;                         PT Education - 04/05/20 1146    Education Details lumbar ROM/strengthening, manual therapy, HEP    Person(s) Educated Patient    Methods Explanation;Verbal cues    Comprehension Verbalized understanding;Returned demonstration;Verbal cues required;Need further instruction            PT Short Term Goals - 03/15/20 0924      PT SHORT TERM GOAL #1   Title After 4 weeks pt to report full indepdence in HEP for progressing strength and activity tolerance.    Baseline no HEP    Time 4    Period Weeks    Status Achieved    Target Date 03/14/20             PT Long Term Goals - 04/04/20 1145      PT LONG TERM GOAL #1   Title After 6 weeks pt to report full tolerance of return to FT work, ability to perform all work duties.    Baseline still on FMLA    Time 6    Period Weeks    Status On-going    Target Date 04/07/20      PT LONG TERM GOAL #2   Title Pt. will have returned to Gym activities without increased pain >2 points to demonstrate implementation of safety recommendations and use of appropriate form.    Baseline machine based and aerobic programs formerly.    Time 6    Period Weeks    Status Revised    Target Date 04/16/20      PT LONG TERM GOAL #3   Title Patient will be independent with HEP to continue benefits of therapy until after discharge.    Baseline Pt. independent with all exercises provided to date.    Time 6    Period Weeks    Status Achieved      PT LONG TERM GOAL #4   Title Pt. will report no leakage of urine, urinary retention, or straining with BM to demonstrate improved control and strength of the PFM    Baseline Pt. having SUI with sneeze, feeling of incomplte bladder emptying and UUI mostly first thing in the morning.    Time 5    Period Weeks    Status Achieved    Target Date 04/16/20                 Plan - 04/05/20 1243    Clinical Impression Statement Patient responded well to today's  session. She responds well to manual therapy with improved tissue extensibility. She continue to have tightness in thoracic paraspinals which is likely due to compensation from weakness in lumbar paraspinals. Progressed strengthening with increased repetition. Patient does require min Vcs for proper exercise technique. Patient would benefit from additional skilled PT  intervention to improve strength and postural control while reducing back pain;    Personal Factors and Comorbidities Age;Education;Behavior Pattern;Fitness;Past/Current Experience;Profession;Time since onset of injury/illness/exacerbation    Examination-Activity Limitations Squat;Carry;Continence;Stairs;Stand;Transfers    Examination-Participation Restrictions Driving;Community Activity;Cleaning;Laundry;Interpersonal Relationship    Stability/Clinical Decision Making Stable/Uncomplicated    Rehab Potential Excellent    Clinical Impairments Affecting Rehab Potential (+) highly active (+) fitness    PT Frequency 2x / week    PT Duration 6 weeks    PT Treatment/Interventions Manual techniques;Neuromuscular re-education;Patient/family education;Therapeutic exercise;Therapeutic activities;Iontophoresis 4mg /ml Dexamethasone;Moist Heat;Cryotherapy;Electrical Stimulation;Ultrasound;Passive range of motion;Dry needling;Joint Manipulations;Scar mobilization    PT Next Visit Plan Review HEP and expand as needed; detailed MMT if desired    PT Home Exercise Plan Added ~ 1/3 cm heel-lift in R shoe    Consulted and Agree with Plan of Care Patient           Patient will benefit from skilled therapeutic intervention in order to improve the following deficits and impairments:  Decreased range of motion, Decreased endurance, Decreased strength, Decreased balance, Pain, Decreased coordination, Decreased mobility, Decreased activity tolerance, Increased fascial restricitons  Visit Diagnosis: Acute midline low back pain without sciatica  Muscle  weakness (generalized)  Other muscle spasm     Problem List Patient Active Problem List   Diagnosis Date Noted  . Lumbar stenosis with neurogenic claudication 12/26/2019  . Hyperbilirubinemia 12/14/2019  . Eye injury 05/26/2019  . Left shoulder pain 05/23/2018  . Snoring 05/23/2018  . Benign neoplasm of descending colon   . Benign neoplasm of ascending colon   . Vaginal atrophy 03/22/2017  . Meniere's disease 08/17/2016  . Colon cancer screening 08/11/2015  . Fatigue 06/03/2015  . Health care maintenance 06/03/2015  . Neck pain 02/05/2014  . Hypercholesterolemia 02/05/2014  . Back pain 12/05/2012  . Trigger finger 12/05/2012  . Essential hypertension, benign 12/05/2012    Shaman Muscarella PT, DPT 04/05/2020, 12:58 PM  [place] MAIN [facility] SERVICES [address] [place], [place], [address] Phone: [phone]   Fax:  [phone]  Name: Lakashia Collison MRN: [id number] Date of Birth: [birth year]

## 2020-04-10 ENCOUNTER — Encounter: Payer: 59 | Admitting: Physical Therapy

## 2020-04-10 ENCOUNTER — Ambulatory Visit: Payer: 59 | Admitting: Physical Therapy

## 2020-04-10 ENCOUNTER — Other Ambulatory Visit: Payer: Self-pay

## 2020-04-10 ENCOUNTER — Encounter: Payer: Self-pay | Admitting: Physical Therapy

## 2020-04-10 DIAGNOSIS — M545 Low back pain, unspecified: Secondary | ICD-10-CM

## 2020-04-10 DIAGNOSIS — M25512 Pain in left shoulder: Secondary | ICD-10-CM | POA: Diagnosis not present

## 2020-04-10 DIAGNOSIS — G8929 Other chronic pain: Secondary | ICD-10-CM | POA: Diagnosis not present

## 2020-04-10 DIAGNOSIS — M6281 Muscle weakness (generalized): Secondary | ICD-10-CM | POA: Diagnosis not present

## 2020-04-10 DIAGNOSIS — M62838 Other muscle spasm: Secondary | ICD-10-CM | POA: Diagnosis not present

## 2020-04-10 DIAGNOSIS — M25511 Pain in right shoulder: Secondary | ICD-10-CM | POA: Diagnosis not present

## 2020-04-10 NOTE — Therapy (Signed)
[place] MAIN [facility] SERVICES [address] [place], [place], [address] Phone: [phone]   Fax:  [phone]  Physical Therapy Treatment  Patient Details  Name: Sara Perry MRN: [id number] Date of Birth: 1961/03/06 Referring Provider (PT): Dr. Jovita Gamma   Encounter Date: 04/10/2020   PT End of Session - 04/10/20 1215    Visit Number 17    Number of Visits 20    Date for PT Re-Evaluation 04/16/20    Authorization Type [employer]    Authorization Time Period 02/15/20-04/16/20 (returns to work 6/7)    PT Start Time 1157    PT Stop Time 1230    PT Time Calculation (min) 33 min    Activity Tolerance Patient tolerated treatment well;No increased pain    Behavior During Therapy WFL for tasks assessed/performed           Past Medical History:  Diagnosis Date   Complication of anesthesia    pt states she is very sensitive to most drugs, usually needs 1/2 of whatever other people get. She states as a child/teenager she had difficulty waking up after surgery due to the sensitivity that she has to drugs.   Eczema    Frequent UTI    H/O lymphadenopathy    With previous submandibular node removals.   Hypertension    Recurrent sinus infections    Sleep apnea    uses c-pap    Past Surgical History:  Procedure Laterality Date   COLONOSCOPY WITH PROPOFOL N/A 08/25/2017   Procedure: COLONOSCOPY WITH PROPOFOL;  Surgeon: Lucilla Lame, MD;  Location: [facility] ENDOSCOPY;  Service: Endoscopy;  Laterality: N/A;   Lymph Node Removal  [place]   H/O Right neck surgery secondary to a benign growth with when patient was 48 or 59 yo   skin lesion extraction  2013   basal cell - Dr Phillip Heal   Uterine Polyps Removed  2004    There were no vitals filed for this visit.   Subjective Assessment - 04/10/20 1157    Subjective Patient reports increased right side low back pain yesterday; She also reports continued  stiffness/tightness in mid to upper back; She is also having right scapular paraspinal tightness;    Pertinent History Has done PT in past for LBP as well as shoulder pain.    How long can you sit comfortably? Up to 30 minutes    How long can you stand comfortably? Less than 45 minutes    How long can you walk comfortably? Has been walking 2-3x daily for 45-60 minutes (tolerated well)    Currently in Pain? Yes    Pain Score 1     Pain Location Back    Pain Orientation Right;Lower    Pain Descriptors / Indicators Aching;Sore    Pain Type Chronic pain    Pain Onset More than a month ago    Pain Frequency Intermittent    Aggravating Factors  work tasks/lifting    Pain Relieving Factors walking/stretch    Effect of Pain on Daily Activities guarded activity;                  TREATMENT: Patient supine: (-) long sit test with good pelvic positioning;   Patient prone: PT assessed thoracolumbar paraspinals with significant tightness noted on left side  PT performed soft/deept tissue massage to left thoracolumbar paraspinals utilizing edge tool for IASTM 8 min; Patient also exhibits trigger point along left thoracic  paraspinals along T6 level, PT performed ischemic trigger point release 30 sec hold x2 reps; She also exhibits increased tightness in right thoracolumbar paraspinals. PT performed soft/deep tissue massage to right thoracolumbar paraspinals utilizing edge tool for IASTM x7 min with good tolerance; Patient able to exhibit better tissue mobility with less tightness following manual therapy;    Following manual therapy: Patient prone: Alternate LE hip extensionx10 reps each LE, patient able to exhibit better multifidi with less pain lifting RLE following manual therapy. However with increased repetition she does have increased difficulty lifting LLE due to weakness in right lumbar paraspinals. Posterior pelvic rock x5 reps with good lumbar flexion ROM for stretch;   Gluteal max hip extension x15 reps each LE with min Vcs for proper positioning and exercise technique;   Qped: Child pose with arms/legs to side to facilitate unilateral QL stretch  X 30 sec each side;   Patient tolerated session well; She reportsless discomfort and significant reduction in tightness at end of session. She responds well to manual therapy with reduction in stiffness and hypomobility.She is able to exhibit better hip AROM and reports less difficulty following manual therapy; reinforced HEP with instruction to increase repetition for better lumbar paraspinals strengthening;                        PT Education - 04/10/20 1215    Education Details lumbar ROM/strengthening, manual therapy, HEP    Person(s) Educated Patient    Methods Explanation;Verbal cues    Comprehension Verbalized understanding;Returned demonstration;Verbal cues required;Need further instruction            PT Short Term Goals - 03/15/20 0924      PT SHORT TERM GOAL #1   Title After 4 weeks pt to report full indepdence in HEP for progressing strength and activity tolerance.    Baseline no HEP    Time 4    Period Weeks    Status Achieved    Target Date 03/14/20             PT Long Term Goals - 04/04/20 1145      PT LONG TERM GOAL #1   Title After 6 weeks pt to report full tolerance of return to FT work, ability to perform all work duties.    Baseline still on FMLA    Time 6    Period Weeks    Status On-going    Target Date 04/07/20      PT LONG TERM GOAL #2   Title Pt. will have returned to Gym activities without increased pain >2 points to demonstrate implementation of safety recommendations and use of appropriate form.    Baseline machine based and aerobic programs formerly.    Time 6    Period Weeks    Status Revised    Target Date 04/16/20      PT LONG TERM GOAL #3   Title Patient will be independent with HEP to continue benefits of therapy until after  discharge.    Baseline Pt. independent with all exercises provided to date.    Time 6    Period Weeks    Status Achieved      PT LONG TERM GOAL #4   Title Pt. will report no leakage of urine, urinary retention, or straining with BM to demonstrate improved control and strength of the PFM    Baseline Pt. having SUI with sneeze, feeling of incomplte bladder emptying and UUI mostly first thing  in the morning.    Time 5    Period Weeks    Status Achieved    Target Date 04/16/20                 Plan - 04/11/20 8453    Clinical Impression Statement Patient motivated and participated well within session. She is still experiencing tightness in bilateral thoracolumbar paraspinals. This is likely due to increased weakness in paraspinals and tightening from overwork with activities. Patient instructed in advanced lumbar strengthening. She would benefit from additional skilled PT Intervention to improve strength and mobility while reducing back pain;    Personal Factors and Comorbidities Age;Education;Behavior Pattern;Fitness;Past/Current Experience;Profession;Time since onset of injury/illness/exacerbation    Examination-Activity Limitations Squat;Carry;Continence;Stairs;Stand;Transfers    Examination-Participation Restrictions Driving;Community Activity;Cleaning;Laundry;Interpersonal Relationship    Stability/Clinical Decision Making Stable/Uncomplicated    Rehab Potential Excellent    Clinical Impairments Affecting Rehab Potential (+) highly active (+) fitness    PT Frequency 2x / week    PT Duration 6 weeks    PT Treatment/Interventions Manual techniques;Neuromuscular re-education;Patient/family education;Therapeutic exercise;Therapeutic activities;Iontophoresis 4mg /ml Dexamethasone;Moist Heat;Cryotherapy;Electrical Stimulation;Ultrasound;Passive range of motion;Dry needling;Joint Manipulations;Scar mobilization    PT Next Visit Plan Review HEP and expand as needed; detailed MMT if desired     PT Home Exercise Plan Added ~ 1/3 cm heel-lift in R shoe    Consulted and Agree with Plan of Care Patient           Patient will benefit from skilled therapeutic intervention in order to improve the following deficits and impairments:  Decreased range of motion, Decreased endurance, Decreased strength, Decreased balance, Pain, Decreased coordination, Decreased mobility, Decreased activity tolerance, Increased fascial restricitons  Visit Diagnosis: Acute midline low back pain without sciatica  Muscle weakness (generalized)  Other muscle spasm     Problem List Patient Active Problem List   Diagnosis Date Noted   Lumbar stenosis with neurogenic claudication 12/26/2019   Hyperbilirubinemia 12/14/2019   Eye injury 05/26/2019   Left shoulder pain 05/23/2018   Snoring 05/23/2018   Benign neoplasm of descending colon    Benign neoplasm of ascending colon    Vaginal atrophy 03/22/2017   Meniere's disease 08/17/2016   Colon cancer screening 08/11/2015   Fatigue 06/03/2015   Health care maintenance 06/03/2015   Neck pain 02/05/2014   Hypercholesterolemia 02/05/2014   Back pain 12/05/2012   Trigger finger 12/05/2012   Essential hypertension, benign 12/05/2012    Daryl Beehler PT, DPT 04/11/2020, 9:19 AM  [place] [address] [place], [place], [address] Phone: [phone]   Fax:  [phone]  Name: Sara Perry MRN: [id number] Date of Birth: 19-Jun-1961

## 2020-04-11 ENCOUNTER — Ambulatory Visit: Payer: 59 | Admitting: Physical Therapy

## 2020-04-11 ENCOUNTER — Encounter: Payer: Self-pay | Admitting: Physical Therapy

## 2020-04-11 DIAGNOSIS — M25512 Pain in left shoulder: Secondary | ICD-10-CM | POA: Diagnosis not present

## 2020-04-11 DIAGNOSIS — M62838 Other muscle spasm: Secondary | ICD-10-CM

## 2020-04-11 DIAGNOSIS — M6281 Muscle weakness (generalized): Secondary | ICD-10-CM

## 2020-04-11 DIAGNOSIS — G8929 Other chronic pain: Secondary | ICD-10-CM | POA: Diagnosis not present

## 2020-04-11 DIAGNOSIS — M545 Low back pain, unspecified: Secondary | ICD-10-CM

## 2020-04-11 DIAGNOSIS — M25511 Pain in right shoulder: Secondary | ICD-10-CM | POA: Diagnosis not present

## 2020-04-11 NOTE — Therapy (Signed)
[place] MAIN [facility] SERVICES [address] [place], [place], [address] Phone: [phone]   Fax:  [phone]  Physical Therapy Treatment  Patient Details  Name: Sara Perry MRN: [id number] Date of Birth: 1961/07/22 Referring Provider (PT): Dr. Jovita Gamma   Encounter Date: 04/11/2020   PT End of Session - 04/11/20 1155    Visit Number 18    Number of Visits 20    Date for PT Re-Evaluation 04/16/20    Authorization Type [employer]    Authorization Time Period 02/15/20-04/16/20 (returns to work 6/7)    PT Start Time 1157    PT Stop Time 1230    PT Time Calculation (min) 33 min    Activity Tolerance Patient tolerated treatment well;No increased pain    Behavior During Therapy WFL for tasks assessed/performed           Past Medical History:  Diagnosis Date  . Complication of anesthesia    pt states she is very sensitive to most drugs, usually needs 1/2 of whatever other people get. She states as a child/teenager she had difficulty waking up after surgery due to the sensitivity that she has to drugs.  . Eczema   . Frequent UTI   . H/O lymphadenopathy    With previous submandibular node removals.  . Hypertension   . Recurrent sinus infections   . Sleep apnea    uses c-pap    Past Surgical History:  Procedure Laterality Date  . COLONOSCOPY WITH PROPOFOL N/A 08/25/2017   Procedure: COLONOSCOPY WITH PROPOFOL;  Surgeon: Lucilla Lame, MD;  Location: [facility] ENDOSCOPY;  Service: Endoscopy;  Laterality: N/A;  . Lymph Node Removal  1984  . NECK SURGERY  1977   H/O Right neck surgery secondary to a benign growth with when patient was 42 or 59 yo  . skin lesion extraction  2013   basal cell - Dr Phillip Heal  . Uterine Polyps Removed  2004    There were no vitals filed for this visit.   Subjective Assessment - 04/11/20 1202    Subjective Patient reports increased stiffness and soreness this morning when she woke up. She reports with  walking and moving around it has gotten better. She reports still having some aching/burning in right hip;    Pertinent History Has done PT in past for LBP as well as shoulder pain.    How long can you sit comfortably? Up to 30 minutes    How long can you stand comfortably? Less than 45 minutes    How long can you walk comfortably? Has been walking 2-3x daily for 45-60 minutes (tolerated well)    Currently in Pain? Yes    Pain Score 2     Pain Location Back    Pain Orientation Right;Lower    Pain Descriptors / Indicators Aching;Sore    Pain Type Chronic pain    Pain Onset More than a month ago    Pain Frequency Intermittent    Aggravating Factors  work tasks/lifting    Pain Relieving Factors walking/stretch    Effect of Pain on Daily Activities guarded activity;              TREATMENT:  Standing with green tband around BLE: -hip abduction x10 reps; -hip extension x10 reps Patient required min-moderate verbal/tactile cues for correct exercise technique including proper positioning for optimal hip strengthening;  Patient also require min VCs to increase core stabilization for lumbar support;  Prone: PT assessed lumbar  activation with hip extension; She was able to exhibit good multifidi firing but does report increased strain; PT performed soft tissue massage to bilateral thoraco-lumbar paraspinals including myofascial release and IASTM using edge tool. x10 min; Patient does exhibit mild tightness but was able to exhibit good tissue extensibility following manual therapy;  Patient does exhibit increased muscle tone on left side compared to right side; She also reports increased difficulty lifting left leg likely due to lumbar weakness;   Sidelying: Clamshells green tband 2x10 with min VCs for proper positioning;  Patient tolerated session well. She does report moderate difficulty with tband exercise to facilitate increased strengthening in LE. Patient does exhibit good motor  control and positioning;   She responds well to manual therapy with improved hip extension and good lumbar paraspinal activation with hip extension following soft tissue massage; She reports less stiffness and less discomfort at end of session;                       PT Education - 04/11/20 1155    Education Details lumbar ROM/strengthening, manual therapy, HEP    Person(s) Educated Patient    Methods Explanation;Verbal cues    Comprehension Verbalized understanding;Returned demonstration;Verbal cues required;Need further instruction            PT Short Term Goals - 03/15/20 0924      PT SHORT TERM GOAL #1   Title After 4 weeks pt to report full indepdence in HEP for progressing strength and activity tolerance.    Baseline no HEP    Time 4    Period Weeks    Status Achieved    Target Date 03/14/20             PT Long Term Goals - 04/04/20 1145      PT LONG TERM GOAL #1   Title After 6 weeks pt to report full tolerance of return to FT work, ability to perform all work duties.    Baseline still on FMLA    Time 6    Period Weeks    Status On-going    Target Date 04/07/20      PT LONG TERM GOAL #2   Title Pt. will have returned to Gym activities without increased pain >2 points to demonstrate implementation of safety recommendations and use of appropriate form.    Baseline machine based and aerobic programs formerly.    Time 6    Period Weeks    Status Revised    Target Date 04/16/20      PT LONG TERM GOAL #3   Title Patient will be independent with HEP to continue benefits of therapy until after discharge.    Baseline Pt. independent with all exercises provided to date.    Time 6    Period Weeks    Status Achieved      PT LONG TERM GOAL #4   Title Pt. will report no leakage of urine, urinary retention, or straining with BM to demonstrate improved control and strength of the PFM    Baseline Pt. having SUI with sneeze, feeling of incomplte bladder  emptying and UUI mostly first thing in the morning.    Time 5    Period Weeks    Status Achieved    Target Date 04/16/20                 Plan - 04/11/20 1229    Clinical Impression Statement Patient motivated and participated well within  session. Instructed patient in advanced LE strengthening exercise utilizing tband for increased resistance. Advanced HEP, see patient instructions. Patient is able to exhibit good positioning and motor control with cues. She responds well to manual therapy with improved tissue extensibility. She would benefit from additional skilled PT intervention to improve strength and mobility while reducing back pain;    Personal Factors and Comorbidities Age;Education;Behavior Pattern;Fitness;Past/Current Experience;Profession;Time since onset of injury/illness/exacerbation    Examination-Activity Limitations Squat;Carry;Continence;Stairs;Stand;Transfers    Examination-Participation Restrictions Driving;Community Activity;Cleaning;Laundry;Interpersonal Relationship    Stability/Clinical Decision Making Stable/Uncomplicated    Rehab Potential Excellent    Clinical Impairments Affecting Rehab Potential (+) highly active (+) fitness    PT Frequency 2x / week    PT Duration 6 weeks    PT Treatment/Interventions Manual techniques;Neuromuscular re-education;Patient/family education;Therapeutic exercise;Therapeutic activities;Iontophoresis 4mg /ml Dexamethasone;Moist Heat;Cryotherapy;Electrical Stimulation;Ultrasound;Passive range of motion;Dry needling;Joint Manipulations;Scar mobilization    PT Next Visit Plan Review HEP and expand as needed; detailed MMT if desired    PT Home Exercise Plan Added ~ 1/3 cm heel-lift in R shoe    Consulted and Agree with Plan of Care Patient           Patient will benefit from skilled therapeutic intervention in order to improve the following deficits and impairments:  Decreased range of motion, Decreased endurance, Decreased  strength, Decreased balance, Pain, Decreased coordination, Decreased mobility, Decreased activity tolerance, Increased fascial restricitons  Visit Diagnosis: Acute midline low back pain without sciatica  Muscle weakness (generalized)  Other muscle spasm     Problem List Patient Active Problem List   Diagnosis Date Noted  . Lumbar stenosis with neurogenic claudication 12/26/2019  . Hyperbilirubinemia 12/14/2019  . Eye injury 05/26/2019  . Left shoulder pain 05/23/2018  . Snoring 05/23/2018  . Benign neoplasm of descending colon   . Benign neoplasm of ascending colon   . Vaginal atrophy 03/22/2017  . Meniere's disease 08/17/2016  . Colon cancer screening 08/11/2015  . Fatigue 06/03/2015  . Health care maintenance 06/03/2015  . Neck pain 02/05/2014  . Hypercholesterolemia 02/05/2014  . Back pain 12/05/2012  . Trigger finger 12/05/2012  . Essential hypertension, benign 12/05/2012    Taysha Majewski PT, DPT 04/11/2020, 2:41 PM  [place] MAIN [facility] SERVICES [address] [place], [place], [address] Phone: [phone]   Fax:  [phone]  Name: Sara Perry MRN: [id number] Date of Birth: Mar 12, 1961

## 2020-04-12 ENCOUNTER — Other Ambulatory Visit: Payer: Self-pay | Admitting: Internal Medicine

## 2020-04-17 ENCOUNTER — Encounter: Payer: 59 | Admitting: Physical Therapy

## 2020-04-19 ENCOUNTER — Encounter: Payer: Self-pay | Admitting: Physical Therapy

## 2020-04-19 ENCOUNTER — Other Ambulatory Visit: Payer: Self-pay

## 2020-04-19 ENCOUNTER — Ambulatory Visit: Payer: 59 | Attending: Neurosurgery | Admitting: Physical Therapy

## 2020-04-19 DIAGNOSIS — M545 Low back pain, unspecified: Secondary | ICD-10-CM

## 2020-04-19 DIAGNOSIS — M62838 Other muscle spasm: Secondary | ICD-10-CM | POA: Insufficient documentation

## 2020-04-19 DIAGNOSIS — M6281 Muscle weakness (generalized): Secondary | ICD-10-CM | POA: Insufficient documentation

## 2020-04-19 NOTE — Patient Instructions (Addendum)
Heel Slide - Advanced    Tilt hips back (posterior pelvic rock) , bring knee to chest using abdominal muscles. Slowly lower leg keeping foot off the floor going as far as comfortable without tilting pelvis. Repeat for _10___ reps each leg.  Copyright  VHI. All rights reserved.  Bilateral Isometric Hip Flexion    Tighten stomach doing posterior pelvic rock: Lift one leg up with hip/knee 90 degrees, then lift other leg to match hold for 5 sec then slowly lower one leg at a time;  Repeat _5___ times per set. Do _1___ sets per session. Do __1__ sessions per day. Repeat 3 times a week;   http://orth.exer.us/1101   Copyright  VHI. All rights reserved.  Prone Plank (Eccentric)    On toes and elbows, pull abdomen in while stabilizing trunk. Hold 10 sec stabilizing low back; _3-5_ reps per set, _1__ sets per day, __3-5_ days per week.  http://ecce.exer.us/243   Copyright  VHI. All rights reserved.

## 2020-04-19 NOTE — Therapy (Signed)
[place] MAIN [facility] SERVICES [address] [place], [place], [address] Phone: [phone]   Fax:  [phone]  Physical Therapy Treatment  Patient Details  Name: Sara Perry MRN: [id number] Date of Birth: 1961-04-28 Referring Provider (PT): Dr. Jovita Gamma   Encounter Date: 04/19/2020   PT End of Session - 04/19/20 1204    Visit Number 19    Number of Visits 24    Date for PT Re-Evaluation 05/17/20    Authorization Type [employer]    Authorization Time Period 02/15/20-04/16/20 (returns to work 6/7)    PT Start Time 1155    PT Stop Time 1235    PT Time Calculation (min) 40 min    Activity Tolerance Patient tolerated treatment well;No increased pain    Behavior During Therapy WFL for tasks assessed/performed           Past Medical History:  Diagnosis Date  . Complication of anesthesia    pt states she is very sensitive to most drugs, usually needs 1/2 of whatever other people get. She states as a child/teenager she had difficulty waking up after surgery due to the sensitivity that she has to drugs.  . Eczema   . Frequent UTI   . H/O lymphadenopathy    With previous submandibular node removals.  . Hypertension   . Recurrent sinus infections   . Sleep apnea    uses c-pap    Past Surgical History:  Procedure Laterality Date  . COLONOSCOPY WITH PROPOFOL N/A 08/25/2017   Procedure: COLONOSCOPY WITH PROPOFOL;  Surgeon: Lucilla Lame, MD;  Location: [facility] ENDOSCOPY;  Service: Endoscopy;  Laterality: N/A;  . Lymph Node Removal  1984  . NECK SURGERY  1977   H/O Right neck surgery secondary to a benign growth with when patient was 70 or 59 yo  . skin lesion extraction  2013   basal cell - Dr Phillip Heal  . Uterine Polyps Removed  2004    There were no vitals filed for this visit.   Subjective Assessment - 04/19/20 1203    Subjective Patient reports increased soreness in low back after having to travel to [place] to help family over  the weekend. She has been able to do some exercise yesterday and today which has helped. She denies any radicular symptoms but just a lot of soreness and tightness;    Pertinent History Has done PT in past for LBP as well as shoulder pain.    How long can you sit comfortably? Up to 30 minutes    How long can you stand comfortably? Less than 45 minutes    How long can you walk comfortably? Has been walking 2-3x daily for 45-60 minutes (tolerated well)    Currently in Pain? Yes    Pain Score 2     Pain Location Back    Pain Orientation Lower    Pain Descriptors / Indicators Aching;Sore    Pain Type Chronic pain    Pain Onset More than a month ago    Pain Frequency Intermittent    Aggravating Factors  work tasks/lifting    Pain Relieving Factors walking/stretch    Effect of Pain on Daily Activities guarded activity;    Multiple Pain Sites No              [facility] PT Assessment - 04/19/20 0001      Observation/Other Assessments   Focus on Therapeutic Outcomes (FOTO)  65/100     Modified Oswestry 20% (  minimal disability)         TREATMENT: Patient hooklying with moist heat to low back:  Posterior pelvic rock x10 reps; -pelvic rock with single leg suspended heel slides x10 reps each LE with mod VCs to avoid anterior pelvic tilt for better core stabilization; -posterior pelvic rock with BLE leg lift hip/knee at 90 degrees flexion to challenge lower abdominal activation 5 sec hold x5 reps; Patient required min-moderate verbal/tactile cues for correct exercise technique and to increase core abdominal stabilization with UE/LE movement  Patient transitioned to prone: Plank on forearm 10 sec hold x3 reps; Patient able to exhibit good positioning and trunk control;  Child pose 15 sec hold x2 reps; Cat/camel stretch in qped 5 sec hold x5 reps;   PT advanced HEP, see patient instructions;  Patient tolerated session well. She denies any increase in pain and actually reports her back pain  is lower after exercise. She is able to exhibit good trunk control/core stabilization with advanced exercise. She would benefit from additional skilled PT intervention to improve strength and reduce pain with ADLs.                          PT Education - 04/19/20 1204    Education Details lumbar ROM/strengthening, manual therapy, HEP    Person(s) Educated Patient    Methods Explanation;Verbal cues    Comprehension Verbalized understanding;Returned demonstration;Verbal cues required;Need further instruction            PT Short Term Goals - 04/19/20 1222      PT SHORT TERM GOAL #1   Title After 4 weeks pt to report full indepdence in HEP for progressing strength and activity tolerance.    Baseline no HEP    Time 4    Period Weeks    Status Achieved    Target Date 03/14/20             PT Long Term Goals - 04/19/20 1222      PT LONG TERM GOAL #1   Title After 6 weeks pt to report full tolerance of return to FT work, ability to perform all work duties.    Baseline still on FMLA    Time 4    Period Weeks    Status Partially Met    Target Date 05/17/20      PT LONG TERM GOAL #2   Title Pt. will have returned to Gym activities without increased pain >2 points to demonstrate implementation of safety recommendations and use of appropriate form.    Baseline machine based and aerobic programs formerly. 7/8: doing 2-3# handweight    Time 4    Period Weeks    Status Partially Met    Target Date 05/17/20      PT LONG TERM GOAL #3   Title Patient will be independent with HEP to continue benefits of therapy until after discharge.    Baseline Pt. independent with all exercises provided to date.    Time 4    Period Weeks    Status Achieved      PT LONG TERM GOAL #4   Title Pt. will report no leakage of urine, urinary retention, or straining with BM to demonstrate improved control and strength of the PFM    Baseline Pt. having SUI with sneeze, feeling of  incomplte bladder emptying and UUI mostly first thing in the morning.    Time 4    Period Weeks  Status Achieved    Target Date 05/17/20      PT LONG TERM GOAL #5   Title Patient will improve FOTO score to >65 to exhibit improved functional mobility and reduced pain with ADLs.    Time 4    Period Weeks    Status New    Target Date 05/17/20                 Plan - 04/19/20 1226    Clinical Impression Statement Patient is progressing well. Instructed patient in advanced core stabilization exercise. Patient does require min VCs for proper positioning and exercise technique. Patient denies any increase in pain but does fatigue quickly. She does exhibit improvement in mobility as evidenced by FOTO score and modified oswestry. She is back at work but still has some difficulty with work tasks. Patient would benefit from additional skilled PT intervention to improve strength and reduce back pain;    Personal Factors and Comorbidities Age;Education;Behavior Pattern;Fitness;Past/Current Experience;Profession;Time since onset of injury/illness/exacerbation    Examination-Activity Limitations Squat;Carry;Continence;Stairs;Stand;Transfers    Examination-Participation Restrictions Driving;Community Activity;Cleaning;Laundry;Interpersonal Relationship    Stability/Clinical Decision Making Stable/Uncomplicated    Rehab Potential Excellent    Clinical Impairments Affecting Rehab Potential (+) highly active (+) fitness    PT Frequency 1x / week    PT Duration 4 weeks    PT Treatment/Interventions Manual techniques;Neuromuscular re-education;Patient/family education;Therapeutic exercise;Therapeutic activities;Iontophoresis 47m/ml Dexamethasone;Moist Heat;Cryotherapy;Electrical Stimulation;Ultrasound;Passive range of motion;Dry needling;Joint Manipulations;Scar mobilization    PT Next Visit Plan Review HEP and expand as needed; detailed MMT if desired    PT Home Exercise Plan Added ~ 1/3 cm heel-lift  in R shoe    Consulted and Agree with Plan of Care Patient           Patient will benefit from skilled therapeutic intervention in order to improve the following deficits and impairments:  Decreased range of motion, Decreased endurance, Decreased strength, Decreased balance, Pain, Decreased coordination, Decreased mobility, Decreased activity tolerance, Increased fascial restricitons  Visit Diagnosis: Acute midline low back pain without sciatica  Muscle weakness (generalized)  Other muscle spasm     Problem List Patient Active Problem List   Diagnosis Date Noted  . Lumbar stenosis with neurogenic claudication 12/26/2019  . Hyperbilirubinemia 12/14/2019  . Eye injury 05/26/2019  . Left shoulder pain 05/23/2018  . Snoring 05/23/2018  . Benign neoplasm of descending colon   . Benign neoplasm of ascending colon   . Vaginal atrophy 03/22/2017  . Meniere's disease 08/17/2016  . Colon cancer screening 08/11/2015  . Fatigue 06/03/2015  . Health care maintenance 06/03/2015  . Neck pain 02/05/2014  . Hypercholesterolemia 02/05/2014  . Back pain 12/05/2012  . Trigger finger 12/05/2012  . Essential hypertension, benign 12/05/2012    Jude Naclerio PT, DPT 04/19/2020, 1:30 PM  [place]MAIN [facility] CenterSERV[facility] [address] [address]Phone: [phone]  Fax:  [phone] Name: PRuey StorerMRN: [id number] of Birth: [birth year]

## 2020-04-23 ENCOUNTER — Encounter: Payer: 59 | Admitting: Physical Therapy

## 2020-04-24 ENCOUNTER — Encounter: Payer: 59 | Admitting: Physical Therapy

## 2020-04-25 ENCOUNTER — Ambulatory Visit: Payer: 59 | Admitting: Physical Therapy

## 2020-04-25 ENCOUNTER — Other Ambulatory Visit: Payer: Self-pay

## 2020-04-25 ENCOUNTER — Encounter: Payer: Self-pay | Admitting: Physical Therapy

## 2020-04-25 DIAGNOSIS — M545 Low back pain, unspecified: Secondary | ICD-10-CM

## 2020-04-25 DIAGNOSIS — M6281 Muscle weakness (generalized): Secondary | ICD-10-CM

## 2020-04-25 DIAGNOSIS — M62838 Other muscle spasm: Secondary | ICD-10-CM | POA: Diagnosis not present

## 2020-04-25 NOTE — Therapy (Signed)
[place] MAIN [facility] SERVICES [address] [place], [place], [address] Phone: [phone]   Fax:  [phone]  Physical Therapy Treatment Physical Therapy Progress Note   Dates of reporting period  03/16/20   to   04/25/20   Patient Details  Name: Sara Perry MRN: [id number] Date of Birth: 20-Sep-1961 Referring Provider (PT): Dr. Jovita Gamma   Encounter Date: 04/25/2020   PT End of Session - 04/25/20 1503    Visit Number 20    Number of Visits 24    Date for PT Re-Evaluation 05/17/20    Authorization Type [employer]    Authorization Time Period 02/15/20-04/16/20 (returns to work 6/7)    PT Start Time 1146    PT Stop Time 1230    PT Time Calculation (min) 44 min    Activity Tolerance Patient tolerated treatment well;No increased pain    Behavior During Therapy WFL for tasks assessed/performed           Past Medical History:  Diagnosis Date  . Complication of anesthesia    pt states she is very sensitive to most drugs, usually needs 1/2 of whatever other people get. She states as a child/teenager she had difficulty waking up after surgery due to the sensitivity that she has to drugs.  . Eczema   . Frequent UTI   . H/O lymphadenopathy    With previous submandibular node removals.  . Hypertension   . Recurrent sinus infections   . Sleep apnea    uses c-pap    Past Surgical History:  Procedure Laterality Date  . COLONOSCOPY WITH PROPOFOL N/A 08/25/2017   Procedure: COLONOSCOPY WITH PROPOFOL;  Surgeon: Lucilla Lame, MD;  Location: [facility] ENDOSCOPY;  Service: Endoscopy;  Laterality: N/A;  . Lymph Node Removal  1984  . NECK SURGERY  1977   H/O Right neck surgery secondary to a benign growth with when patient was 18 or 59 yo  . skin lesion extraction  2013   basal cell - Dr Phillip Heal  . Uterine Polyps Removed  2004    There were no vitals filed for this visit.   Subjective Assessment - 04/25/20 1151    Subjective Patient  reports increased soreness in low back with increased tightness. She was able to go to the beach and lay in the sand; She reports adherence with HEP;    Pertinent History Has done PT in past for LBP as well as shoulder pain.    How long can you sit comfortably? Up to 30 minutes    How long can you stand comfortably? Less than 45 minutes    How long can you walk comfortably? Has been walking 2-3x daily for 45-60 minutes (tolerated well)    Currently in Pain? Yes    Pain Score 1     Pain Location Back    Pain Orientation Lower    Pain Descriptors / Indicators Aching;Sore    Pain Type Chronic pain    Pain Onset More than a month ago    Pain Frequency Intermittent    Aggravating Factors  work tasks/lifting    Pain Relieving Factors walking/stretch    Effect of Pain on Daily Activities guarded activity;    Multiple Pain Sites No                  TREATMENT:   Prone: PT assessed lumbar activation with hip extension; She was able to exhibit good multifidi firing but does report increased strain  especially when lifting RLE;  PT performed soft tissue massage to bilateral thoraco-lumbar paraspinals including myofascial release and IASTM using edge tool. x24 min; Patient does exhibit mild tightness but was able to exhibit good tissue extensibility following manual therapy;  Patient does exhibit increased muscle tone on left side compared to right side; She was able to exhibit better hip extension with improved ROM and improved multifidi firing following exercise;   Qped: Alternate UE/LE lift x5 reps with bar on back to provide tactile cues for proper positioning. Patient does exhibit slight lean when lifting LLE likely due to right gluteal weakness; Hip abduction fire hydrant x10 reps each LE with min VCs for proper positioning;  Child pose 30 sec hold x3 reps; Cat/camel stretch 5 sec hold x5 reps each;  Hooklying piriformis stretch 30 sec hold x2 reps each LE;   Patient  tolerated session well. She does report moderate difficulty with advanced LE exercise. She was able to exhibit better core activation and stabilization with qped exercise.   She responds well to manual therapy with improved hip extension and good lumbar paraspinal activation with hip extension following soft tissue massage; She reports less stiffness and less discomfort at end of session;    Patient's condition has the potential to improve in response to therapy. Maximum improvement is yet to be obtained. The anticipated improvement is attainable and reasonable in a generally predictable time.  Patient reports adherence with HEP and is pleased with her progress;                      PT Education - 04/25/20 1503    Education Details lumbar ROM/strengthening, manual therapy, HEP    Person(s) Educated Patient    Methods Explanation;Verbal cues    Comprehension Verbalized understanding;Returned demonstration;Verbal cues required;Need further instruction            PT Short Term Goals - 04/19/20 1222      PT SHORT TERM GOAL #1   Title After 4 weeks pt to report full indepdence in HEP for progressing strength and activity tolerance.    Baseline no HEP    Time 4    Period Weeks    Status Achieved    Target Date 03/14/20             PT Long Term Goals - 04/19/20 1222      PT LONG TERM GOAL #1   Title After 6 weeks pt to report full tolerance of return to FT work, ability to perform all work duties.    Baseline still on FMLA    Time 4    Period Weeks    Status Partially Met    Target Date 05/17/20      PT LONG TERM GOAL #2   Title Pt. will have returned to Gym activities without increased pain >2 points to demonstrate implementation of safety recommendations and use of appropriate form.    Baseline machine based and aerobic programs formerly. 7/8: doing 2-3# handweight    Time 4    Period Weeks    Status Partially Met    Target Date 05/17/20      PT LONG  TERM GOAL #3   Title Patient will be independent with HEP to continue benefits of therapy until after discharge.    Baseline Pt. independent with all exercises provided to date.    Time 4    Period Weeks    Status Achieved      PT  LONG TERM GOAL #4   Title Pt. will report no leakage of urine, urinary retention, or straining with BM to demonstrate improved control and strength of the PFM    Baseline Pt. having SUI with sneeze, feeling of incomplte bladder emptying and UUI mostly first thing in the morning.    Time 4    Period Weeks    Status Achieved    Target Date 05/17/20      PT LONG TERM GOAL #5   Title Patient will improve FOTO score to >65 to exhibit improved functional mobility and reduced pain with ADLs.    Time 4    Period Weeks    Status New    Target Date 05/17/20                 Plan - 04/25/20 1504    Clinical Impression Statement Patient motivated and participated well within session. She does exhibit increased tightness in left thoracolumbar paraspinals this session with difficulty lifting RLE into hip extension. PT performed extensive manual therapy to improve tissue extensibility including utilizing edge tool for IASTM. Patient responded well exhibiting better tissue extensibility. She continues to have weakness in BLE (R>L). Patient instructed in advanced LE strengthening. She would benefit from additional skilled PT Intervention to improve strength and reduce back pain with ADLs. Goals were recently addressed as patient is making progress towards most goals.    Personal Factors and Comorbidities Age;Education;Behavior Pattern;Fitness;Past/Current Experience;Profession;Time since onset of injury/illness/exacerbation    Examination-Activity Limitations Squat;Carry;Continence;Stairs;Stand;Transfers    Examination-Participation Restrictions Driving;Community Activity;Cleaning;Laundry;Interpersonal Relationship    Stability/Clinical Decision Making  Stable/Uncomplicated    Rehab Potential Excellent    Clinical Impairments Affecting Rehab Potential (+) highly active (+) fitness    PT Frequency 1x / week    PT Duration 4 weeks    PT Treatment/Interventions Manual techniques;Neuromuscular re-education;Patient/family education;Therapeutic exercise;Therapeutic activities;Iontophoresis 52m/ml Dexamethasone;Moist Heat;Cryotherapy;Electrical Stimulation;Ultrasound;Passive range of motion;Dry needling;Joint Manipulations;Scar mobilization    PT Next Visit Plan Review HEP and expand as needed; detailed MMT if desired    PT Home Exercise Plan Added ~ 1/3 cm heel-lift in R shoe    Consulted and Agree with Plan of Care Patient           Patient will benefit from skilled therapeutic intervention in order to improve the following deficits and impairments:  Decreased range of motion, Decreased endurance, Decreased strength, Decreased balance, Pain, Decreased coordination, Decreased mobility, Decreased activity tolerance, Increased fascial restricitons  Visit Diagnosis: Acute midline low back pain without sciatica  Muscle weakness (generalized)  Other muscle spasm     Problem List Patient Active Problem List   Diagnosis Date Noted  . Lumbar stenosis with neurogenic claudication 12/26/2019  . Hyperbilirubinemia 12/14/2019  . Eye injury 05/26/2019  . Left shoulder pain 05/23/2018  . Snoring 05/23/2018  . Benign neoplasm of descending colon   . Benign neoplasm of ascending colon   . Vaginal atrophy 03/22/2017  . Meniere's disease 08/17/2016  . Colon cancer screening 08/11/2015  . Fatigue 06/03/2015  . Health care maintenance 06/03/2015  . Neck pain 02/05/2014  . Hypercholesterolemia 02/05/2014  . Back pain 12/05/2012  . Trigger finger 12/05/2012  . Essential hypertension, benign 12/05/2012    Cuca Benassi PT, DPT 04/25/2020, 3:11 PM  [place]MAIN [facility]tonSERV[facility] [address] [place] [address]Phone: [phone]  Fax:  [phone] Name: PAlli JasmerMRN: [id number] of Birth: 1December 17, 1962

## 2020-04-28 IMAGING — DX DG SHOULDER 2+V*L*
3 series · 3 of 3 positions shown · non-contrast
Comparison: None.

CLINICAL DATA: Chronic left shoulder pain

EXAM:
LEFT SHOULDER - 2+ VIEW

[shoulder (grashey) ap]
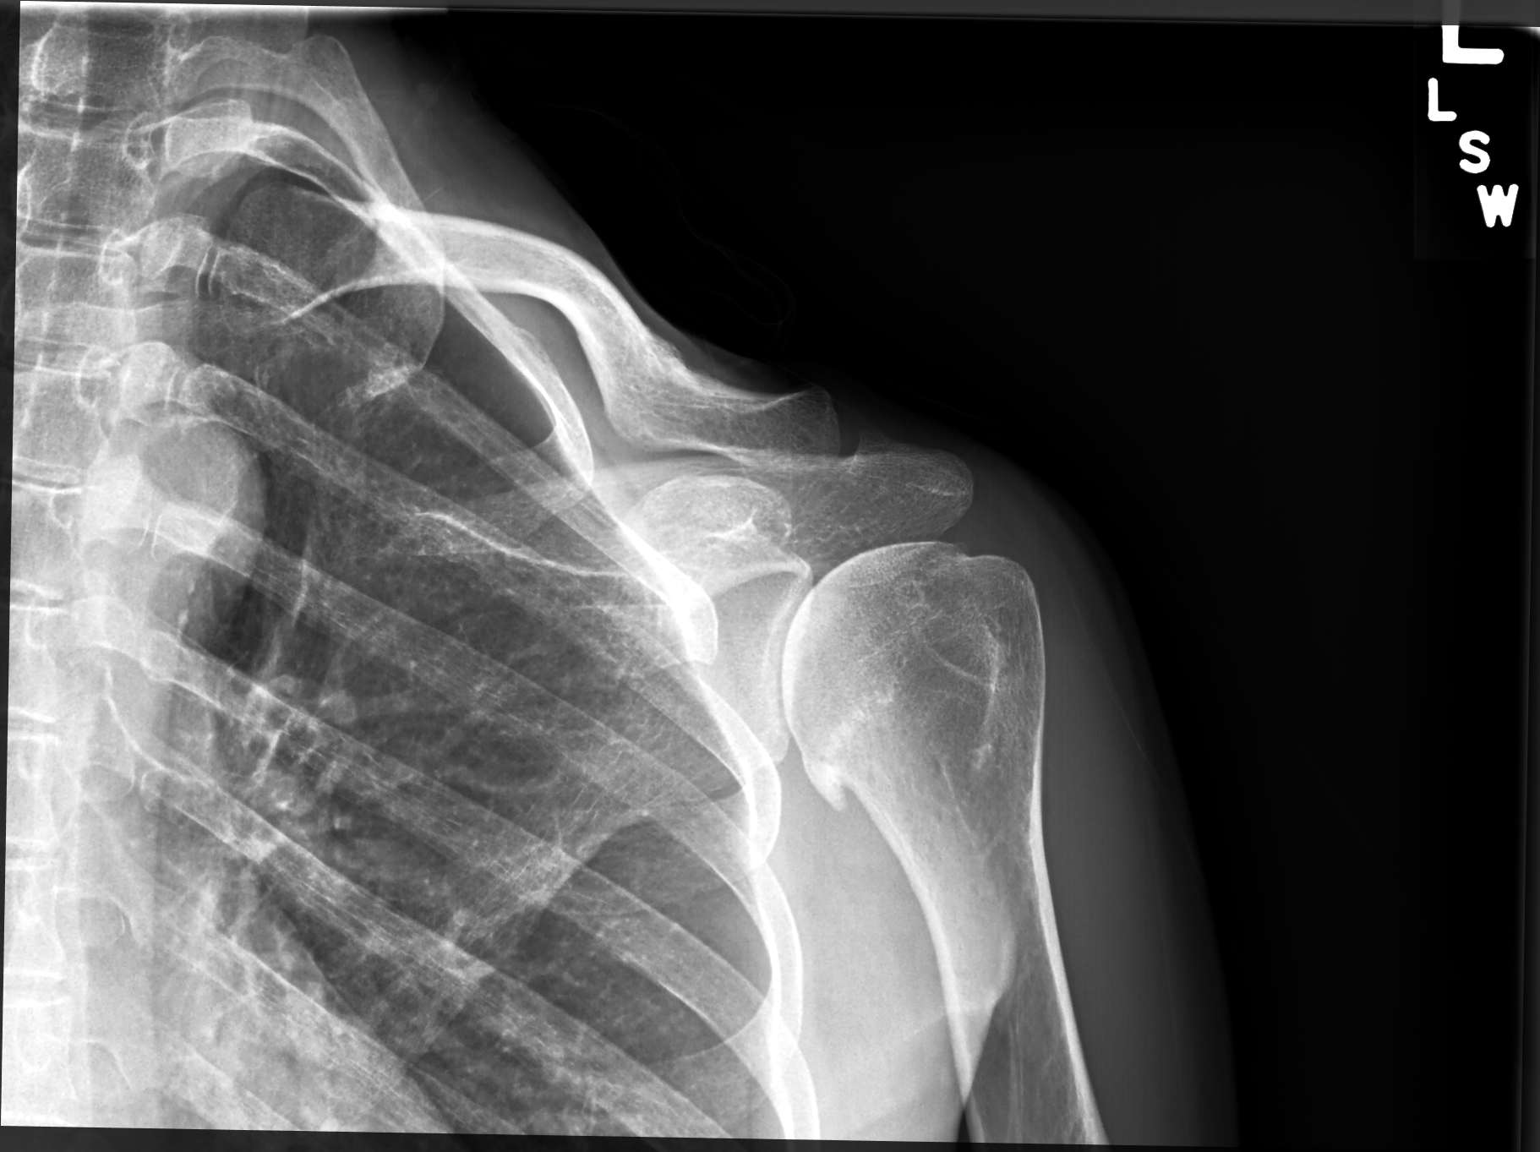

[shoulder y view]
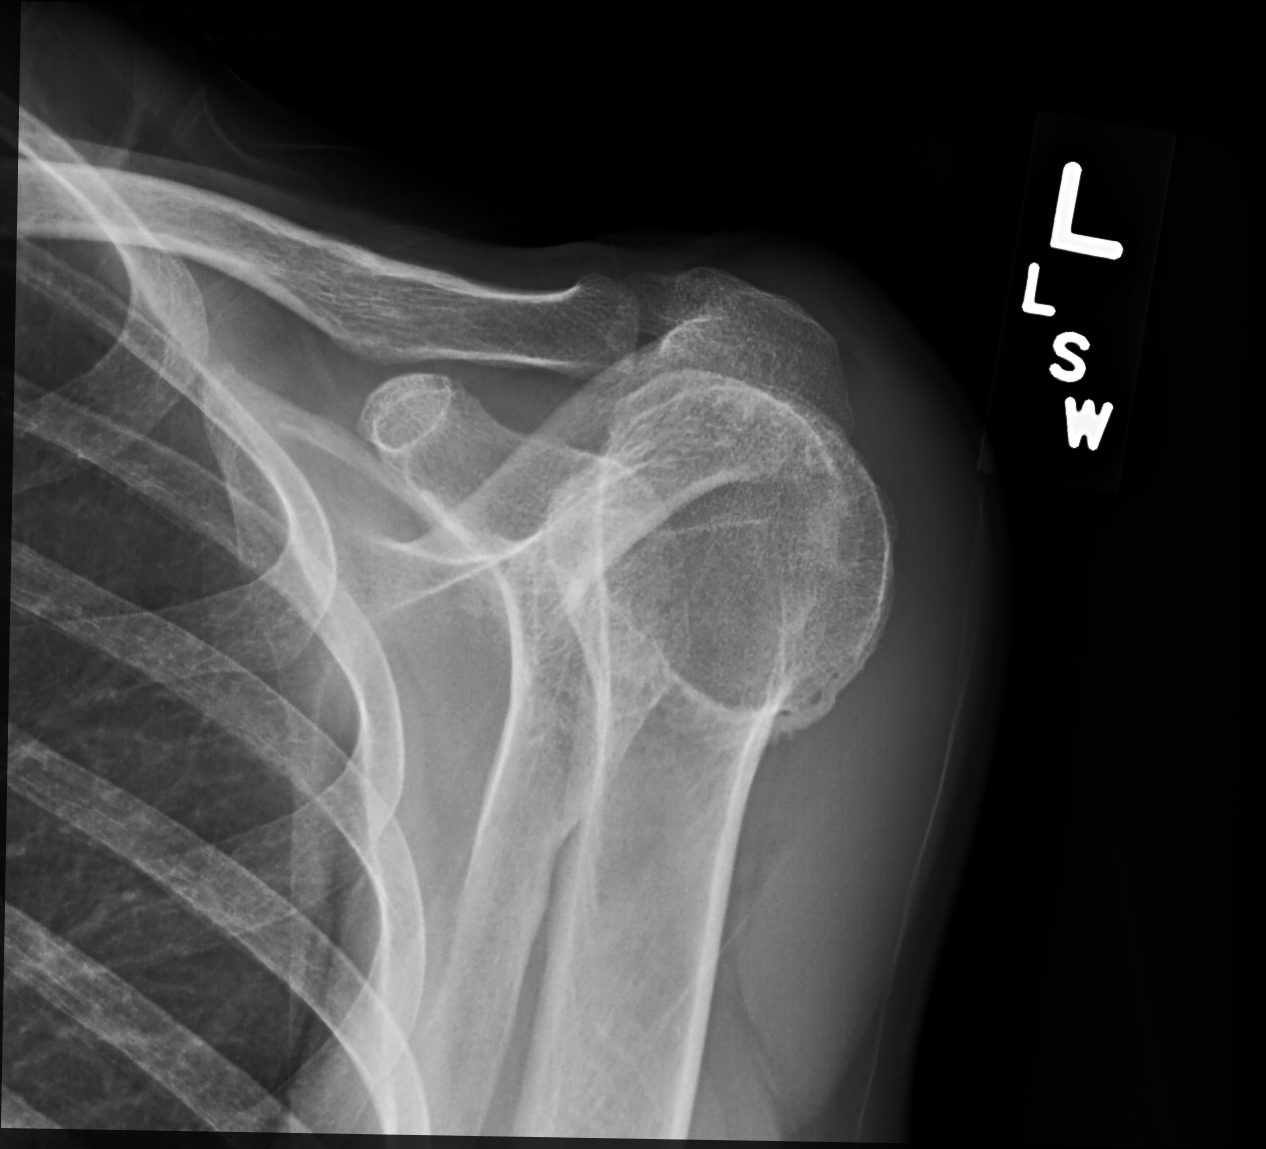

[shoulder axillary pa]
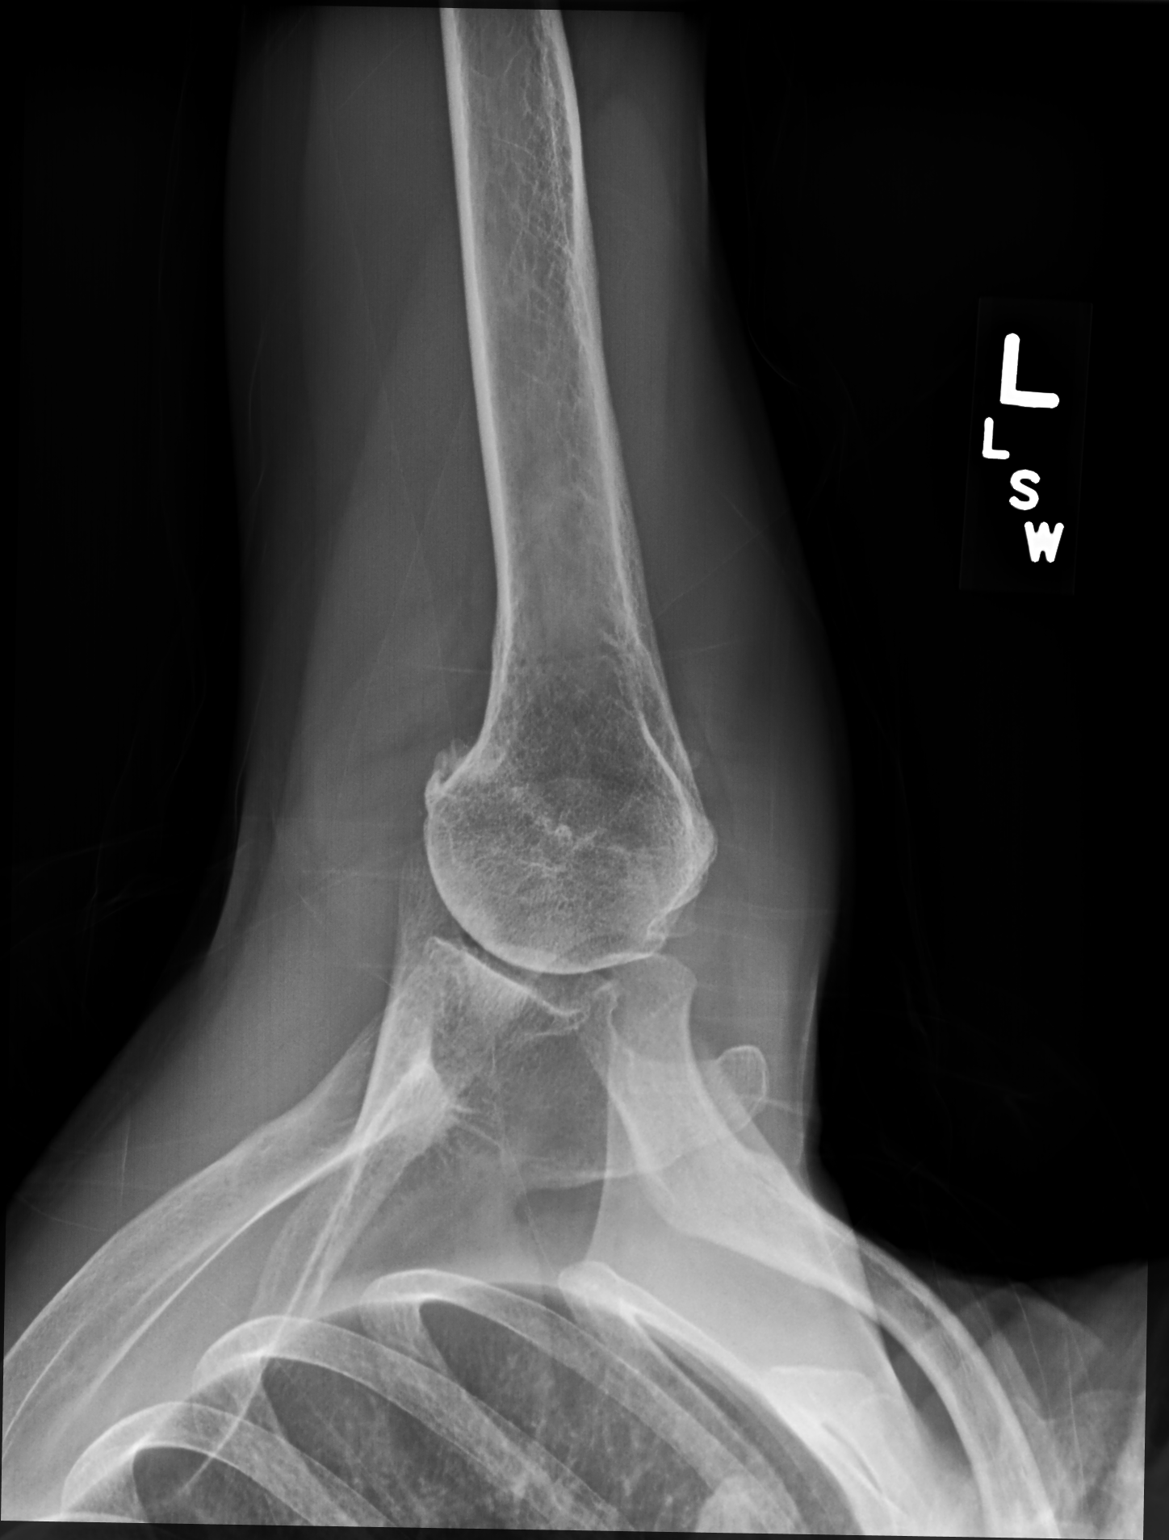

[3 of 3 positions shown; findings below may reference images not displayed]

FINDINGS: Glenohumeral joint space narrowing and inferior humeral head
osteophytes. No acute fracture or dislocation. Unremarkable soft
tissues.
IMPRESSION: No acute bony pathology.  Degenerative change.

## 2020-04-30 ENCOUNTER — Encounter: Payer: 59 | Admitting: Physical Therapy

## 2020-05-01 ENCOUNTER — Encounter: Payer: 59 | Admitting: Physical Therapy

## 2020-05-02 ENCOUNTER — Ambulatory Visit: Payer: 59 | Admitting: Physical Therapy

## 2020-05-02 ENCOUNTER — Encounter: Payer: Self-pay | Admitting: Physical Therapy

## 2020-05-02 ENCOUNTER — Other Ambulatory Visit: Payer: Self-pay

## 2020-05-02 DIAGNOSIS — M62838 Other muscle spasm: Secondary | ICD-10-CM | POA: Diagnosis not present

## 2020-05-02 DIAGNOSIS — M545 Low back pain, unspecified: Secondary | ICD-10-CM

## 2020-05-02 DIAGNOSIS — M6281 Muscle weakness (generalized): Secondary | ICD-10-CM

## 2020-05-02 NOTE — Therapy (Signed)
[place] MAIN [facility] SERVICES [address] [place], [place], [address] Phone: [phone]   Fax:  [phone]  Physical Therapy Treatment  Patient Details  Name: Sara Perry MRN: [id number] Date of Birth: 09/22/1961 Referring Provider (PT): Dr. Jovita Gamma   Encounter Date: 05/02/2020   PT End of Session - 05/02/20 1153    Visit Number 21    Number of Visits 24    Date for PT Re-Evaluation 05/17/20    Authorization Type [employer]    Authorization Time Period 02/15/20-04/16/20 (returns to work 6/7)    PT Start Time 1146    PT Stop Time 1230    PT Time Calculation (min) 44 min    Activity Tolerance Patient tolerated treatment well;No increased pain    Behavior During Therapy WFL for tasks assessed/performed           Past Medical History:  Diagnosis Date  . Complication of anesthesia    pt states she is very sensitive to most drugs, usually needs 1/2 of whatever other people get. She states as a child/teenager she had difficulty waking up after surgery due to the sensitivity that she has to drugs.  . Eczema   . Frequent UTI   . H/O lymphadenopathy    With previous submandibular node removals.  . Hypertension   . Recurrent sinus infections   . Sleep apnea    uses c-pap    Past Surgical History:  Procedure Laterality Date  . COLONOSCOPY WITH PROPOFOL N/A 08/25/2017   Procedure: COLONOSCOPY WITH PROPOFOL;  Surgeon: Lucilla Lame, MD;  Location: [facility] ENDOSCOPY;  Service: Endoscopy;  Laterality: N/A;  . Lymph Node Removal  1984  . NECK SURGERY  1977   H/O Right neck surgery secondary to a benign growth with when patient was 48 or 59 yo  . skin lesion extraction  2013   basal cell - Dr Phillip Heal  . Uterine Polyps Removed  2004    There were no vitals filed for this visit.   Subjective Assessment - 05/02/20 1151    Subjective Patient reports doing well; She reports adherence with HEP and states that she still has some weakness  (R>L) but states that the pain is very minimal. She does have some difficulty with lifting especially after a long day.    Pertinent History Has done PT in past for LBP as well as shoulder pain.    How long can you sit comfortably? Up to 30 minutes    How long can you stand comfortably? Less than 45 minutes    How long can you walk comfortably? Has been walking 2-3x daily for 45-60 minutes (tolerated well)    Currently in Pain? No/denies    Pain Onset More than a month ago    Multiple Pain Sites No             TREATMENT: Pt still having tightness in mid thoracic spine; Instructed patient in thoracic postural strengthening to help reduce strain on lumbar spine:   Patient prone: 2# hand weight: -BUE shoulder extension x10 reps; -low row x10 reps; -mid row x10 reps Patient required min-moderate verbal/tactile cues for correct exercise technique.  Qped: Cat/camel stretch 15 sec hold x5 reps; Child's pose 15 sec hold x2 reps;  Standing:  Red tband in BUE: -BUE shoulder extension x10 reps; -BUE low row x10 reps; Patient able to exhibit good motor control and positioning;  Facing wall: -BUE overhead alternate UE lift off wall 2#  x10 reps each UE with min VCs for scapular retraction;  Educated patient in pball exercise for HEP progression: Seated pball: -gentle bounce x1 min; -pelvic rocks:  Anterior/posterior x10 reps;  Side/side x10 reps;  Circles x5 reps clockwise/counterclockwise Patient does report increased difficulty when extended and right lateral flexion likely due to lumbar compression; She reports feeling stretch in opposite direction;  Patient tolerated session well, provided HEP for increased adherence;                        PT Education - 05/02/20 1153    Education Details lumbar ROM/strengthening, HEP    Person(s) Educated Patient    Methods Explanation;Verbal cues    Comprehension Verbalized understanding;Returned demonstration;Verbal  cues required;Need further instruction            PT Short Term Goals - 04/19/20 1222      PT SHORT TERM GOAL #1   Title After 4 weeks pt to report full indepdence in HEP for progressing strength and activity tolerance.    Baseline no HEP    Time 4    Period Weeks    Status Achieved    Target Date 03/14/20             PT Long Term Goals - 04/19/20 1222      PT LONG TERM GOAL #1   Title After 6 weeks pt to report full tolerance of return to FT work, ability to perform all work duties.    Baseline still on FMLA    Time 4    Period Weeks    Status Partially Met    Target Date 05/17/20      PT LONG TERM GOAL #2   Title Pt. will have returned to Gym activities without increased pain >2 points to demonstrate implementation of safety recommendations and use of appropriate form.    Baseline machine based and aerobic programs formerly. 7/8: doing 2-3# handweight    Time 4    Period Weeks    Status Partially Met    Target Date 05/17/20      PT LONG TERM GOAL #3   Title Patient will be independent with HEP to continue benefits of therapy until after discharge.    Baseline Pt. independent with all exercises provided to date.    Time 4    Period Weeks    Status Achieved      PT LONG TERM GOAL #4   Title Pt. will report no leakage of urine, urinary retention, or straining with BM to demonstrate improved control and strength of the PFM    Baseline Pt. having SUI with sneeze, feeling of incomplte bladder emptying and UUI mostly first thing in the morning.    Time 4    Period Weeks    Status Achieved    Target Date 05/17/20      PT LONG TERM GOAL #5   Title Patient will improve FOTO score to >65 to exhibit improved functional mobility and reduced pain with ADLs.    Time 4    Period Weeks    Status New    Target Date 05/17/20                 Plan - 05/02/20 1556    Clinical Impression Statement Patient motivated and participated well within session. Instructed  patient in postural strengthening exercise to increase thoracic paraspinals control and stability to help alleviate strain on lumbar spine. She does requrie min  VCs for proper exercise technique. Also educated patient in advanced core strengthening exercise utilizing pball for increased instability. patient denies any pain with advanced exercise. She would benefit from additional skilled PT intervention to improve strength and reduce back pain;    Personal Factors and Comorbidities Age;Education;Behavior Pattern;Fitness;Past/Current Experience;Profession;Time since onset of injury/illness/exacerbation    Examination-Activity Limitations Squat;Carry;Continence;Stairs;Stand;Transfers    Examination-Participation Restrictions Driving;Community Activity;Cleaning;Laundry;Interpersonal Relationship    Stability/Clinical Decision Making Stable/Uncomplicated    Rehab Potential Excellent    Clinical Impairments Affecting Rehab Potential (+) highly active (+) fitness    PT Frequency 1x / week    PT Duration 4 weeks    PT Treatment/Interventions Manual techniques;Neuromuscular re-education;Patient/family education;Therapeutic exercise;Therapeutic activities;Iontophoresis 22m/ml Dexamethasone;Moist Heat;Cryotherapy;Electrical Stimulation;Ultrasound;Passive range of motion;Dry needling;Joint Manipulations;Scar mobilization    PT Next Visit Plan Review HEP and expand as needed; detailed MMT if desired    PT Home Exercise Plan Added ~ 1/3 cm heel-lift in R shoe    Consulted and Agree with Plan of Care Patient           Patient will benefit from skilled therapeutic intervention in order to improve the following deficits and impairments:  Decreased range of motion, Decreased endurance, Decreased strength, Decreased balance, Pain, Decreased coordination, Decreased mobility, Decreased activity tolerance, Increased fascial restricitons  Visit Diagnosis: Acute midline low back pain without sciatica  Muscle  weakness (generalized)     Problem List Patient Active Problem List   Diagnosis Date Noted  . Lumbar stenosis with neurogenic claudication 12/26/2019  . Hyperbilirubinemia 12/14/2019  . Eye injury 05/26/2019  . Left shoulder pain 05/23/2018  . Snoring 05/23/2018  . Benign neoplasm of descending colon   . Benign neoplasm of ascending colon   . Vaginal atrophy 03/22/2017  . Meniere's disease 08/17/2016  . Colon cancer screening 08/11/2015  . Fatigue 06/03/2015  . Health care maintenance 06/03/2015  . Neck pain 02/05/2014  . Hypercholesterolemia 02/05/2014  . Back pain 12/05/2012  . Trigger finger 12/05/2012  . Essential hypertension, benign 12/05/2012    Aaidyn San PT, DPT 05/02/2020, 3:58 PM  [place] [facility] [address] [place] [address]Phone: [phone]  Fax:  [phone] Name: PAviona MartensonMRN: [id number] of Birth: [birth year]

## 2020-05-02 NOTE — Patient Instructions (Signed)
Access Code: [email or web address] URL: [email or web address] Date: 05/02/2020 Prepared by: Blanche East  Exercises Prone Shoulder Extension Palm Down with Dumbbell - 1 x daily - 7 x weekly - 2 sets - 10 reps Prone Shoulder Row - 1 x daily - 7 x weekly - 2 sets - 10 reps Prone on Swiss Ball Shoulder Row to ER to Overhead Reach w/ weight - 1 x daily - 7 x weekly - 2 sets - 10 reps Single Arm Shoulder Extension with Anchored Resistance - 1 x daily - 7 x weekly - 2 sets - 10 reps Standing Bilateral Low Shoulder Row with Anchored Resistance - 1 x daily - 7 x weekly - 2 sets - 10 reps Seated Lateral Pelvic Tilt on Swiss Ball - 1 x daily - 7 x weekly - 1 sets - 10 reps Seated Gentle Bouncing on Swiss Ball with Baby - 1 x daily - 7 x weekly - 1 sets - 10 reps Seated Swiss Ball Pelvic Circles - 1 x daily - 7 x weekly - 1 sets - 10 reps

## 2020-05-07 ENCOUNTER — Ambulatory Visit: Payer: 59 | Admitting: Physical Therapy

## 2020-05-07 ENCOUNTER — Encounter: Payer: Self-pay | Admitting: Physical Therapy

## 2020-05-07 ENCOUNTER — Other Ambulatory Visit: Payer: Self-pay

## 2020-05-07 DIAGNOSIS — M6281 Muscle weakness (generalized): Secondary | ICD-10-CM | POA: Diagnosis not present

## 2020-05-07 DIAGNOSIS — M545 Low back pain, unspecified: Secondary | ICD-10-CM

## 2020-05-07 DIAGNOSIS — M62838 Other muscle spasm: Secondary | ICD-10-CM

## 2020-05-07 NOTE — Therapy (Signed)
[place] MAIN [facility] SERVICES [address] [place], [place], [address] Phone: [phone]   Fax:  [phone]  Physical Therapy Treatment/Discharge Summary  Patient Details  Name: Sara Perry MRN: [id number] Date of Birth: [birth year] Referring Provider (PT): Dr. Jovita Gamma   Encounter Date: 05/07/2020   PT End of Session - 05/07/20 1143    Visit Number 22    Number of Visits 24    Date for PT Re-Evaluation 05/17/20    Authorization Type [employer]    Authorization Time Period 02/15/20-04/16/20 (returns to work 6/7)    PT Start Time 1140    PT Stop Time 1225    PT Time Calculation (min) 45 min    Activity Tolerance Patient tolerated treatment well;No increased pain    Behavior During Therapy WFL for tasks assessed/performed           Past Medical History:  Diagnosis Date  . Complication of anesthesia    pt states she is very sensitive to most drugs, usually needs 1/2 of whatever other people get. She states as a child/teenager she had difficulty waking up after surgery due to the sensitivity that she has to drugs.  . Eczema   . Frequent UTI   . H/O lymphadenopathy    With previous submandibular node removals.  . Hypertension   . Recurrent sinus infections   . Sleep apnea    uses c-pap    Past Surgical History:  Procedure Laterality Date  . COLONOSCOPY WITH PROPOFOL N/A 08/25/2017   Procedure: COLONOSCOPY WITH PROPOFOL;  Surgeon: Lucilla Lame, MD;  Location: [facility] ENDOSCOPY;  Service: Endoscopy;  Laterality: N/A;  . Lymph Node Removal  1984  . NECK SURGERY  1977   H/O Right neck surgery secondary to a benign growth with when patient was 80 or 59 yo  . skin lesion extraction  2013   basal cell - Dr Phillip Heal  . Uterine Polyps Removed  2004    There were no vitals filed for this visit.   Subjective Assessment - 05/07/20 1142    Subjective Patient reports a little stiffness after driving to [place] over the weekend; No  pain currently; Reports adherence with HEP    Pertinent History Has done PT in past for LBP as well as shoulder pain.    How long can you sit comfortably? Up to 30 minutes    How long can you stand comfortably? Less than 45 minutes    How long can you walk comfortably? Has been walking 2-3x daily for 45-60 minutes (tolerated well)    Currently in Pain? No/denies    Pain Onset More than a month ago               [facility] PT Assessment - 05/08/20 0001      Observation/Other Assessments   Focus on Therapeutic Outcomes (FOTO)  63/100    Modified Oswestry 22% (moderate disability)           TREATMENT: Patient educated on HEP progressing using pball for increased core abdominal challenge: Hooklying with pball: -posterior pelvic rock x5 reps; -bridges x10 reps; -lumbar trunk rotation x5 reps; -double knee to chest stretch 20 sec hold x3 reps; -pushing on ball with abdominal curl up x5 reps;  Patient qped over pball: -alternate LE lift x10 reps; -progressed alternate UE/LE lift x5 reps; -flexion stretch over pball x2 min -child pose 20 sec hold x2 reps;   sitting on pball: Gentle bounce x1 min Walk  out to bridge position:  Alternate heel raise x5 reps;  Attempted alternate march x5 reps; Abdominal curl up x5 reps;  Patient required min-moderate verbal/tactile cues for correct exercise technique and to increase core abdominal stabilization with UE/LE movement   Pt tolerated session well. She was given advanced HEP for exercise progression. Patient has met most goals and is appropriate for discharge from physical therapy at this time;                       PT Education - 05/07/20 1142    Education Details Lumbar ROM/strengthening, HEP    Person(s) Educated Patient    Methods Explanation;Verbal cues    Comprehension Verbalized understanding;Returned demonstration;Verbal cues required;Need further instruction            PT Short Term Goals - 05/08/20  1128      PT SHORT TERM GOAL #1   Title After 4 weeks pt to report full indepdence in HEP for progressing strength and activity tolerance.    Baseline no HEP    Time 4    Period Weeks    Status Achieved    Target Date 03/14/20             PT Long Term Goals - 05/08/20 1128      PT LONG TERM GOAL #1   Title After 6 weeks pt to report full tolerance of return to FT work, ability to perform all work duties.    Baseline has returned full duty    Time 4    Period Weeks    Status Achieved    Target Date 05/17/20      PT LONG TERM GOAL #2   Title Pt. will have returned to Gym activities without increased pain >2 points to demonstrate implementation of safety recommendations and use of appropriate form.    Baseline machine based and aerobic programs formerly. 7/8: doing 2-3# handweight    Time 4    Period Weeks    Status Partially Met    Target Date 05/17/20      PT LONG TERM GOAL #3   Title Patient will be independent with HEP to continue benefits of therapy until after discharge.    Baseline Pt. independent with all exercises provided to date.    Time 4    Period Weeks    Status Achieved    Target Date 05/17/20      PT LONG TERM GOAL #4   Title Pt. will report no leakage of urine, urinary retention, or straining with BM to demonstrate improved control and strength of the PFM    Baseline Pt. having SUI with sneeze, feeling of incomplte bladder emptying and UUI mostly first thing in the morning.    Time 4    Period Weeks    Status Achieved    Target Date 05/17/20      PT LONG TERM GOAL #5   Title Patient will improve FOTO score to >65 to exhibit improved functional mobility and reduced pain with ADLs.    Time 4    Period Weeks    Status Partially Met    Target Date 05/17/20                 Plan - 05/08/20 1124    Clinical Impression Statement Patient motivated and participated well within session. She continues to have mild tightness in thoracolumbar  paraspinals. Patient reports adherence with HEP and states that she has generally been  doing well. Instructed patient in advanced HEP with core/back strengthening using pball for added difficulty. printed new exercise for increased adherence. Patient re-educated in HEP and how to continue physical fitness after physical therapy. She has met most goals and is appopriate for discharge from physical therapy at this time. Patient agreeable.    Personal Factors and Comorbidities Age;Education;Behavior Pattern;Fitness;Past/Current Experience;Profession;Time since onset of injury/illness/exacerbation    Examination-Activity Limitations Squat;Carry;Continence;Stairs;Stand;Transfers    Examination-Participation Restrictions Driving;Community Activity;Cleaning;Laundry;Interpersonal Relationship    Stability/Clinical Decision Making Stable/Uncomplicated    Rehab Potential Excellent    Clinical Impairments Affecting Rehab Potential (+) highly active (+) fitness    PT Frequency 1x / week    PT Duration 4 weeks    PT Treatment/Interventions Manual techniques;Neuromuscular re-education;Patient/family education;Therapeutic exercise;Therapeutic activities;Iontophoresis 68m/ml Dexamethasone;Moist Heat;Cryotherapy;Electrical Stimulation;Ultrasound;Passive range of motion;Dry needling;Joint Manipulations;Scar mobilization    PT Next Visit Plan Review HEP and expand as needed; detailed MMT if desired    PT Home Exercise Plan Added ~ 1/3 cm heel-lift in R shoe    Consulted and Agree with Plan of Care Patient           Patient will benefit from skilled therapeutic intervention in order to improve the following deficits and impairments:  Decreased range of motion, Decreased endurance, Decreased strength, Decreased balance, Pain, Decreased coordination, Decreased mobility, Decreased activity tolerance, Increased fascial restricitons  Visit Diagnosis: Acute midline low back pain without sciatica  Muscle weakness  (generalized)  Other muscle spasm     Problem List Patient Active Problem List   Diagnosis Date Noted  . Lumbar stenosis with neurogenic claudication 12/26/2019  . Hyperbilirubinemia 12/14/2019  . Eye injury 05/26/2019  . Left shoulder pain 05/23/2018  . Snoring 05/23/2018  . Benign neoplasm of descending colon   . Benign neoplasm of ascending colon   . Vaginal atrophy 03/22/2017  . Meniere's disease 08/17/2016  . Colon cancer screening 08/11/2015  . Fatigue 06/03/2015  . Health care maintenance 06/03/2015  . Neck pain 02/05/2014  . Hypercholesterolemia 02/05/2014  . Back pain 12/05/2012  . Trigger finger 12/05/2012  . Essential hypertension, benign 12/05/2012    Newel Oien PT, DPT 05/08/2020, 11:31 AM  [facility]ount Gretna HeightsMAIN [facility] HospitalSERV[facility] [address] [address]Phone: [phone]  Fax:  [phone] Name: Sara HaughnMRN: [id number] of Birth: 106/20/1962

## 2020-05-07 NOTE — Patient Instructions (Signed)
Access Code: [email or web address] URL: [email or web address] Date: 05/07/2020 Prepared by: Blanche East  Exercises Supine Hamstring Curl on Swiss Ball - 1 x daily - 7 x weekly - 1 sets - 3 reps - 20 sec hold Abdominal Press into [place] and Roll - 1 x daily - 7 x weekly - 2 sets - 10 reps Supine Gluteus and Hamstring Sets on Swiss Ball - 1 x daily - 7 x weekly - 2 sets - 10 reps Kneeling Thoracic Extension Stretch with Swiss Ball - 1 x daily - 7 x weekly - 1 sets - 3 reps - 20 sec hold Prone Hip Extension on Swiss Ball - 1 x daily - 7 x weekly - 1 sets - 10 reps [employer] on [employer] - 1 x daily - 7 x weekly - 1 sets - 10 reps Prone Flexion Stretch on Swiss Ball - 1 x daily - 7 x weekly - 1 sets - 3 reps - 20 sec hold Push Up with Shins on Swiss Ball - 1 x daily - 7 x weekly - 1 sets - 10 reps [employer] - 1 x daily - 7 x weekly - 1 sets - 10 reps Crunch on [employer] - 1 x daily - 7 x weekly - 1 sets - 10 reps

## 2020-05-08 ENCOUNTER — Encounter: Payer: 59 | Admitting: Physical Therapy

## 2020-06-15 ENCOUNTER — Telehealth: Payer: Self-pay | Admitting: Internal Medicine

## 2020-06-15 ENCOUNTER — Ambulatory Visit (INDEPENDENT_AMBULATORY_CARE_PROVIDER_SITE_OTHER): Payer: 59 | Admitting: Internal Medicine

## 2020-06-15 ENCOUNTER — Ambulatory Visit: Payer: 59 | Admitting: Internal Medicine

## 2020-06-15 ENCOUNTER — Other Ambulatory Visit: Payer: Self-pay

## 2020-06-15 DIAGNOSIS — G4730 Sleep apnea, unspecified: Secondary | ICD-10-CM

## 2020-06-15 DIAGNOSIS — E7800 Pure hypercholesterolemia, unspecified: Secondary | ICD-10-CM | POA: Diagnosis not present

## 2020-06-15 DIAGNOSIS — M79644 Pain in right finger(s): Secondary | ICD-10-CM | POA: Diagnosis not present

## 2020-06-15 DIAGNOSIS — M5442 Lumbago with sciatica, left side: Secondary | ICD-10-CM | POA: Diagnosis not present

## 2020-06-15 DIAGNOSIS — I10 Essential (primary) hypertension: Secondary | ICD-10-CM | POA: Diagnosis not present

## 2020-06-15 NOTE — Telephone Encounter (Signed)
Patient has appointment for a physical on 12-6 need lab orders put in

## 2020-06-15 NOTE — Progress Notes (Signed)
[name], female   DOB: [birth year], 59 y.o.   MRN: [id number]   Subjective:    [name], female    DOB: 02-25-1961, 59 y.o.   MRN: [id number]  HPI This visit occurred during the SARS-CoV-2 public health emergency.  Safety protocols were in place, including screening questions prior to the visit, additional usage of staff PPE, and extensive cleaning of exam room while observing appropriate contact time as indicated for disinfecting solutions.  Patient here for a scheduled follow up.  Is s/p back surgery 12/26/19.  Went to physical therapy.  Back is doing well.  She feels good overall.  Tries to stay active.  No chest pain or sob reported.  No abdominal pain or bowel change reported.  Some pain base of right thumb.  Wants to hold on any further evaluation.  Blood pressures ok.  Some sleep issues.  Has known sleep apnea.  Has seen Dr Juanell Fairly previously.  Request f/u with pulmonary.    Past Medical History:  Diagnosis Date  . Complication of anesthesia    pt states she is very sensitive to most drugs, usually needs 1/2 of whatever other people get. She states as a child/teenager she had difficulty waking up after surgery due to the sensitivity that she has to drugs.  . Eczema   . Frequent UTI   . H/O lymphadenopathy    With previous submandibular node removals.  . Hypertension   . Recurrent sinus infections   . Sleep apnea    uses c-pap   Past Surgical History:  Procedure Laterality Date  . COLONOSCOPY WITH PROPOFOL N/A 08/25/2017   Procedure: COLONOSCOPY WITH PROPOFOL;  Surgeon: Lucilla Lame, MD;  Location: [facility] ENDOSCOPY;  Service: Endoscopy;  Laterality: N/A;  . Lymph Node Removal  1984  . NECK SURGERY  1977   H/O Right neck surgery secondary to a benign growth with when patient was 13 or 59 yo  . skin lesion extraction  2013   basal cell - Dr Phillip Heal  . Uterine Polyps Removed  2004   Family History  Problem Relation Age of Onset  .  Hyperlipidemia Mother   . Thyroid disease Mother   . Heart disease Mother        Heart Valve problems  . Coronary artery disease Father        Stents placed  . Hypertension Other        uncle  . Parkinson's disease Paternal Uncle   . Parkinson's disease Maternal Grandmother   . Heart disease Maternal Grandfather   . Heart disease Paternal Grandfather        MI  . Breast cancer Neg Hx    Social History   Socioeconomic History  . Marital status: Divorced    Spouse name: Not on file  . Number of children: 2  . Years of education: Not on file  . Highest education level: Not on file  Occupational History    Employer: [employer]  Tobacco Use  . Smoking status: Never Smoker  . Smokeless tobacco: Never Used  Vaping Use  . Vaping Use: Never used  Substance and Sexual Activity  . Alcohol use: Yes    Alcohol/week: 0.0 standard drinks    Comment: Occasional  . Drug use: No  . Sexual activity: Not on file  Other Topics Concern  . Not on file  Social History Narrative  . Not on file   Social Determinants of Health   Financial Resource  Strain:   . Difficulty of Paying Living Expenses: Not on file  Food Insecurity:   . Worried About Charity fundraiser in the Last Year: Not on file  . Ran Out of Food in the Last Year: Not on file  Transportation Needs:   . Lack of Transportation (Medical): Not on file  . Lack of Transportation (Non-Medical): Not on file  Physical Activity:   . Days of Exercise per Week: Not on file  . Minutes of Exercise per Session: Not on file  Stress:   . Feeling of Stress : Not on file  Social Connections:   . Frequency of Communication with Friends and Family: Not on file  . Frequency of Social Gatherings with Friends and Family: Not on file  . Attends Religious Services: Not on file  . Active Member of Clubs or Organizations: Not on file  . Attends Archivist Meetings: Not on file  . Marital Status: Not on file    Outpatient Encounter  Medications as of 06/15/2020  Medication Sig  . acetaminophen (TYLENOL) 325 MG tablet Take 325 mg by mouth every 4 (four) hours as needed for mild pain or moderate pain.   . Calcium Carb-Cholecalciferol (CALCIUM-VITAMIN D) 600-400 MG-UNIT TABS Take 1 tablet by mouth daily.   . Clobetasol Prop Emollient Base 0.05 % emollient cream Apply 1 application topically See admin instructions. Two time a week as needed for eczema  . estradiol (ESTRACE VAGINAL) 0.1 MG/GM vaginal cream One application q hs for 5 nights and then 2 x/week (Patient taking differently: Place 1 Applicatorful vaginally once a week. )  . fluocinonide (LIDEX) 0.05 % external solution APPLY TO AFFECTED AREA(S) AT BEDTIME (Patient taking differently: Apply 1 application topically See admin instructions. Use once or twice a week)  . GLUCOSAMINE-CHONDROITIN PO Take 1 tablet by mouth daily. 750/600 / Boron 1.5  mg 30 mg vitamin C Manganese 1 mg  . HYDROcodone-acetaminophen (NORCO/VICODIN) 5-325 MG tablet Take 1-2 tablets by mouth every 4 (four) hours as needed (pain).  Marland Kitchen ibuprofen (ADVIL) 200 MG tablet Take 200 mg by mouth every 4 (four) hours as needed for mild pain or moderate pain.   . meloxicam (MOBIC) 7.5 MG tablet May take one to two tablets daily as needed for pain. Take with food. (Patient taking differently: Take 7.5-15 mg by mouth daily as needed for pain. daily as needed for pain. Take with food.)  . Multiple Vitamin (MULTIVITAMIN) tablet Take 1 tablet by mouth daily. Centrum Silver 50+  . telmisartan (MICARDIS) 20 MG tablet TAKE 1 TABLET BY MOUTH DAILY  . tretinoin (RETIN-A) 0.05 % cream Apply 1 application topically every 30 (thirty) days. Apply to affected area as needed for Acne   No facility-administered encounter medications on file as of 06/15/2020.   Review of Systems  Constitutional: Negative for appetite change and unexpected weight change.  HENT: Negative for congestion and sinus pressure.   Respiratory: Negative for  cough, chest tightness and shortness of breath.   Cardiovascular: Negative for chest pain, palpitations and leg swelling.  Gastrointestinal: Negative for abdominal pain, diarrhea, nausea and vomiting.  Genitourinary: Negative for difficulty urinating and dysuria.  Musculoskeletal: Negative for joint swelling and myalgias.       Back doing better s/p back surgery.   Skin: Negative for color change and rash.  Neurological: Negative for dizziness, light-headedness and headaches.  Psychiatric/Behavioral: Negative for agitation and dysphoric mood.       Objective:    Physical Exam  Constitutional:      General: She is not in acute distress.    Appearance: Normal appearance.  HENT:     Head: Normocephalic and atraumatic.     Right Ear: External ear normal.     Left Ear: External ear normal.  Eyes:     General: No scleral icterus.       Right eye: No discharge.        Left eye: No discharge.     Conjunctiva/sclera: Conjunctivae normal.  Neck:     Thyroid: No thyromegaly.  Cardiovascular:     Rate and Rhythm: Normal rate and regular rhythm.  Pulmonary:     Effort: No respiratory distress.     Breath sounds: Normal breath sounds. No wheezing.  Abdominal:     General: Bowel sounds are normal.     Palpations: Abdomen is soft.     Tenderness: There is no abdominal tenderness.  Musculoskeletal:        General: No swelling or tenderness.     Cervical back: Neck supple. No tenderness.  Lymphadenopathy:     Cervical: No cervical adenopathy.  Skin:    Findings: No erythema or rash.  Neurological:     Mental Status: She is alert.  Psychiatric:        Mood and Affect: Mood normal.        Behavior: Behavior normal.     BP 128/72   Pulse 80   Temp 98.2 F (36.8 C) (Oral)   Resp 16   Ht 5\' 4"  (1.626 m)   Wt 122 lb (55.3 kg)   LMP 12/01/2008   SpO2 98%   BMI 20.94 kg/m  Wt Readings from Last 3 Encounters:  06/15/20 122 lb (55.3 kg)  12/26/19 120 lb (54.4 kg)  12/14/19 122  lb (55.3 kg)     Lab Results  Component Value Date   WBC 5.6 12/26/2019   HGB 14.0 12/26/2019   HCT 44.0 12/26/2019   PLT 314 12/26/2019   GLUCOSE 86 12/26/2019   CHOL 200 12/12/2019   TRIG 138.0 12/12/2019   HDL 56.10 12/12/2019   LDLDIRECT 148.9 08/15/2013   LDLCALC 117 (H) 12/12/2019   ALT 14 02/01/2020   AST 16 02/01/2020   NA 142 12/26/2019   K 3.5 12/26/2019   CL 107 12/26/2019   CREATININE 0.66 12/26/2019   BUN 17 12/26/2019   CO2 27 12/26/2019   TSH 1.38 02/01/2020       Assessment & Plan:   Problem List Items Addressed This Visit    Thumb pain, right    Wants to hold on any further evaluation at this time.  Follow.       Sleep apnea    Has a diagnosis of sleep apnea.  Has seen Dr Juanell Fairly previously.  Request f/u with pulmonary.        Relevant Orders   Ambulatory referral to Pulmonology   Hypercholesterolemia    Low cholesterol diet and exercise.  Follow lipid panel.       Relevant Orders   Hepatic function panel   Lipid panel   Essential hypertension, benign    Blood pressures doing well.  On micardis.  Follow pressures.  Follow metabolic panel.       Relevant Orders   Basic metabolic panel   Back pain    S/p surgery and doing well.  Follow.           Einar Pheasant, MD

## 2020-06-19 DIAGNOSIS — M47816 Spondylosis without myelopathy or radiculopathy, lumbar region: Secondary | ICD-10-CM | POA: Diagnosis not present

## 2020-06-20 ENCOUNTER — Encounter: Payer: Self-pay | Admitting: Internal Medicine

## 2020-06-20 DIAGNOSIS — G4730 Sleep apnea, unspecified: Secondary | ICD-10-CM | POA: Insufficient documentation

## 2020-06-20 DIAGNOSIS — M79644 Pain in right finger(s): Secondary | ICD-10-CM | POA: Insufficient documentation

## 2020-06-20 NOTE — Assessment & Plan Note (Signed)
S/p surgery and doing well.  Follow.   

## 2020-06-20 NOTE — Assessment & Plan Note (Signed)
Blood pressures doing well.  On micardis.  Follow pressures.  Follow metabolic panel.

## 2020-06-20 NOTE — Assessment & Plan Note (Signed)
Has a diagnosis of sleep apnea.  Has seen Dr Juanell Fairly previously.  Request f/u with pulmonary.

## 2020-06-20 NOTE — Assessment & Plan Note (Signed)
Low cholesterol diet and exercise.  Follow lipid panel.   

## 2020-06-20 NOTE — Assessment & Plan Note (Signed)
Wants to hold on any further evaluation at this time.  Follow.  

## 2020-06-26 NOTE — Telephone Encounter (Signed)
Lab orders are already in

## 2020-07-09 DIAGNOSIS — H5213 Myopia, bilateral: Secondary | ICD-10-CM | POA: Diagnosis not present

## 2020-07-26 ENCOUNTER — Encounter: Payer: Self-pay | Admitting: Internal Medicine

## 2020-07-26 DIAGNOSIS — M2550 Pain in unspecified joint: Secondary | ICD-10-CM

## 2020-07-26 DIAGNOSIS — D7589 Other specified diseases of blood and blood-forming organs: Secondary | ICD-10-CM

## 2020-07-27 ENCOUNTER — Telehealth: Payer: Self-pay | Admitting: Internal Medicine

## 2020-07-27 NOTE — Telephone Encounter (Signed)
Called and scheduled pt

## 2020-07-27 NOTE — Telephone Encounter (Signed)
Please schedule fasting lab appt within the next 1-2 weeks.  Thanks

## 2020-07-27 NOTE — Telephone Encounter (Signed)
Orders placed for lab.  Message sent to schedule for fasting lab appt.

## 2020-07-30 ENCOUNTER — Other Ambulatory Visit: Payer: Self-pay

## 2020-07-30 ENCOUNTER — Other Ambulatory Visit (INDEPENDENT_AMBULATORY_CARE_PROVIDER_SITE_OTHER): Payer: 59

## 2020-07-30 DIAGNOSIS — E7800 Pure hypercholesterolemia, unspecified: Secondary | ICD-10-CM

## 2020-07-30 DIAGNOSIS — I10 Essential (primary) hypertension: Secondary | ICD-10-CM

## 2020-07-30 DIAGNOSIS — D7589 Other specified diseases of blood and blood-forming organs: Secondary | ICD-10-CM | POA: Diagnosis not present

## 2020-07-30 DIAGNOSIS — M2550 Pain in unspecified joint: Secondary | ICD-10-CM

## 2020-07-30 LAB — LIPID PANEL
Cholesterol: 203 mg/dL — ABNORMAL HIGH (ref 0–200)
HDL: 58.1 mg/dL (ref >39.00)
LDL Cholesterol: 123 mg/dL — ABNORMAL HIGH (ref 0–99)
NonHDL: 144.41
Total CHOL/HDL Ratio: 3
Triglycerides: 107 mg/dL (ref 0.0–149.0)
VLDL: 21.4 mg/dL (ref 0.0–40.0)

## 2020-07-30 LAB — BASIC METABOLIC PANEL
BUN: 17 mg/dL (ref 6–23)
CO2: 31 mEq/L (ref 19–32)
Calcium: 9.7 mg/dL (ref 8.4–10.5)
Chloride: 103 mEq/L (ref 96–112)
Creatinine, Ser: 0.73 mg/dL (ref 0.40–1.20)
GFR: 90.17 mL/min (ref >60.00)
Glucose, Bld: 79 mg/dL (ref 70–99)
Potassium: 4.5 mEq/L (ref 3.5–5.1)
Sodium: 140 mEq/L (ref 135–145)

## 2020-07-30 LAB — HEPATIC FUNCTION PANEL
ALT: 16 U/L (ref 0–35)
AST: 19 U/L (ref 0–37)
Albumin: 4.4 g/dL (ref 3.5–5.2)
Alkaline Phosphatase: 59 U/L (ref 39–117)
Bilirubin, Direct: 0.2 mg/dL (ref 0.0–0.3)
Total Bilirubin: 1.2 mg/dL (ref 0.2–1.2)
Total Protein: 6.6 g/dL (ref 6.0–8.3)

## 2020-07-30 LAB — SEDIMENTATION RATE: Sed Rate: 1 mm/hr (ref 0–30)

## 2020-07-30 LAB — CBC WITH DIFFERENTIAL/PLATELET
Basophils Absolute: 0 10*3/uL (ref 0.0–0.1)
Basophils Relative: 0.2 % (ref 0.0–3.0)
Eosinophils Absolute: 0.1 10*3/uL (ref 0.0–0.7)
Eosinophils Relative: 1.4 % (ref 0.0–5.0)
HCT: 39.3 % (ref 36.0–46.0)
Hemoglobin: 13.2 g/dL (ref 12.0–15.0)
Lymphocytes Relative: 27.1 % (ref 12.0–46.0)
Lymphs Abs: 1.3 10*3/uL (ref 0.7–4.0)
MCHC: 33.5 g/dL (ref 30.0–36.0)
MCV: 97.4 fl (ref 78.0–100.0)
Monocytes Absolute: 0.4 10*3/uL (ref 0.1–1.0)
Monocytes Relative: 7.4 % (ref 3.0–12.0)
Neutro Abs: 3.1 10*3/uL (ref 1.4–7.7)
Neutrophils Relative %: 63.9 % (ref 43.0–77.0)
Platelets: 310 10*3/uL (ref 150.0–400.0)
RBC: 4.04 Mil/uL (ref 3.87–5.11)
RDW: 13.2 % (ref 11.5–15.5)
WBC: 4.9 10*3/uL (ref 4.0–10.5)

## 2020-07-30 LAB — VITAMIN B12: Vitamin B-12: 524 pg/mL (ref 211–911)

## 2020-07-31 ENCOUNTER — Encounter: Payer: Self-pay | Admitting: Internal Medicine

## 2020-08-01 LAB — ANTI-NUCLEAR AB-TITER (ANA TITER)
ANA TITER: 1:40 {titer} — ABNORMAL HIGH
ANA Titer 1: 1:80 {titer} — ABNORMAL HIGH

## 2020-08-01 LAB — ANA: Anti Nuclear Antibody (ANA): POSITIVE — AB

## 2020-08-01 LAB — RHEUMATOID FACTOR: Rheumatoid fact SerPl-aCnc: <14 IU/mL (ref <14)

## 2020-08-03 ENCOUNTER — Other Ambulatory Visit: Payer: Self-pay | Admitting: Internal Medicine

## 2020-08-03 DIAGNOSIS — M2550 Pain in unspecified joint: Secondary | ICD-10-CM

## 2020-08-03 NOTE — Progress Notes (Signed)
Order placed for rheumatology referral.  

## 2020-08-07 ENCOUNTER — Ambulatory Visit (INDEPENDENT_AMBULATORY_CARE_PROVIDER_SITE_OTHER): Payer: 59 | Admitting: Pulmonary Disease

## 2020-08-07 ENCOUNTER — Other Ambulatory Visit: Payer: Self-pay

## 2020-08-07 ENCOUNTER — Encounter: Payer: Self-pay | Admitting: Pulmonary Disease

## 2020-08-07 VITALS — BP 118/88 | HR 84 | Temp 97.1°F | Ht 64.0 in | Wt 118.2 lb

## 2020-08-07 DIAGNOSIS — G4733 Obstructive sleep apnea (adult) (pediatric): Secondary | ICD-10-CM | POA: Diagnosis not present

## 2020-08-07 DIAGNOSIS — G4730 Sleep apnea, unspecified: Secondary | ICD-10-CM | POA: Diagnosis not present

## 2020-08-07 NOTE — Progress Notes (Signed)
@Patient  ID: Sara Perry, female    DOB: [birth year], 58 y.o.   MRN: [id number]  Chief Complaint  Patient presents with  . Follow-up    still wearing CPAP almost nightly. feels that pressures are okay. masks have given her problems.    Referring provider: Einar Pheasant, MD  HPI:  59 year old female never smoker found our office for mild obstructive sleep apnea  PMH: Hypertension, hyperlipidemia, fatigue Smoker/ Smoking History: Never smoker Maintenance: None Pt of: DR  08/07/2020  - Visit   59 year old female never smoker found our office for obstructive sleep apnea. Last seen in our office in December/2019 by DR.   Patient presented to her office today as a follow-up.  Patient reports that overall she feels she is doing well.  She has struggled with her mask fit.  She has been tried on a full facemask as well as a nasal pillows mask.  She struggles with using the nasal pillows because whenever she tosses and turns in bed it causes a leak.  She struggles with a full face mask as sometimes it causes nasal bridge pain.  She has previously had a formal mask fitting in the sleep lab at [facility].  This is back in 2019.  She is unsure if there is other options that are available to her  Her brother has obstructive sleep apnea and was treated with an oral appliance.  She is unsure if this would be a potential option for her.  She reports that her general dentist had given her an estimate of around 600 $800 out-of-pocket for cost of an oral appliance.  Although she has mild obstructive sleep apnea she is felt clinical improvement since starting CPAP therapy.  CPAP compliance report shows excellent compliance see compliance were listed below:  07/07/2020-08/05/2020-CPAP compliance report-26 at the last 30 days used, 26 those days greater than 4 hours, average usage 6 hours and 31 minutes, APAP setting 5-15, AHI 1.1  Questionaires / Pulmonary Flowsheets:   ACT:  No  flowsheet data found.  MMRC: No flowsheet data found.  Epworth:  Results of the Epworth flowsheet 06/03/2018  Sitting and reading 2  Watching TV 2  Sitting, inactive in a public place (e.g. a theatre or a meeting) 1  As a passenger in a car for an hour without a break 3  Lying down to rest in the afternoon when circumstances permit 3  Sitting and talking to someone 0  Sitting quietly after a lunch without alcohol 2  In a car, while stopped for a few minutes in traffic 0  Total score 13    Tests:   HST 07/02/2018>> mild OSA with AHI of 6.  FENO:  No results found for: NITRICOXIDE  PFT: No flowsheet data found.  WALK:  No flowsheet data found.  Imaging: No results found.  Lab Results:  CBC    Component Value Date/Time   WBC 4.9 07/30/2020 0830   RBC 4.04 07/30/2020 0830   HGB 13.2 07/30/2020 0830   HGB 13.4 12/04/2012 0928   HCT 39.3 07/30/2020 0830   HCT 40.3 12/04/2012 0928   PLT 310.0 07/30/2020 0830   PLT 260 12/04/2012 0928   MCV 97.4 07/30/2020 0830   MCV 98 12/04/2012 0928   MCH 32.0 12/26/2019 0939   MCHC 33.5 07/30/2020 0830   RDW 13.2 07/30/2020 0830   RDW 12.7 12/04/2012 0928   LYMPHSABS 1.3 07/30/2020 0830   LYMPHSABS 1.3 12/04/2012 0928   MONOABS 0.4 07/30/2020 0830  MONOABS 0.3 12/04/2012 0928   EOSABS 0.1 07/30/2020 0830   EOSABS 0.1 12/04/2012 0928   BASOSABS 0.0 07/30/2020 0830   BASOSABS 0.0 12/04/2012 0928    BMET    Component Value Date/Time   NA 140 07/30/2020 0830   K 4.5 07/30/2020 0830   CL 103 07/30/2020 0830   CO2 31 07/30/2020 0830   GLUCOSE 79 07/30/2020 0830   BUN 17 07/30/2020 0830   CREATININE 0.73 07/30/2020 0830   CALCIUM 9.7 07/30/2020 0830   GFRNONAA >60 12/26/2019 0939   GFRAA >60 12/26/2019 0939    BNP No results found for: BNP  ProBNP No results found for: PROBNP  Specialty Problems      Pulmonary Problems   Snoring   Sleep apnea      Allergies  Allergen Reactions  . Voltaren [Diclofenac  Sodium]     Liver problem    Immunization History  Administered Date(s) Administered  . Influenza Split 07/10/2014  . Influenza-Unspecified 07/18/2015, 06/23/2017, 06/20/2018, 07/20/2020  . PFIZER SARS-COV-2 Vaccination 12/09/2019, 01/20/2020  . Tdap 11/19/2017    Past Medical History:  Diagnosis Date  . Complication of anesthesia    pt states she is very sensitive to most drugs, usually needs 1/2 of whatever other people get. She states as a child/teenager she had difficulty waking up after surgery due to the sensitivity that she has to drugs.  . Eczema   . Frequent UTI   . H/O lymphadenopathy    With previous submandibular node removals.  . Hypertension   . Recurrent sinus infections   . Sleep apnea    uses c-pap    Tobacco History: Social History   Tobacco Use  Smoking Status Never Smoker  Smokeless Tobacco Never Used   Counseling given: Not Answered   Continue to not smoke  Outpatient Encounter Medications as of 08/07/2020  Medication Sig  . Calcium Carb-Cholecalciferol (CALCIUM-VITAMIN D) 600-400 MG-UNIT TABS Take 1 tablet by mouth daily.   . Clobetasol Prop Emollient Base 0.05 % emollient cream Apply 1 application topically See admin instructions. Two time a week as needed for eczema  . estradiol (ESTRACE VAGINAL) 0.1 MG/GM vaginal cream One application q hs for 5 nights and then 2 x/week (Patient taking differently: Place 1 Applicatorful vaginally once a week. )  . fluocinonide (LIDEX) 0.05 % external solution APPLY TO AFFECTED AREA(S) AT BEDTIME (Patient taking differently: Apply 1 application topically See admin instructions. Use once or twice a week)  . GLUCOSAMINE-CHONDROITIN PO Take 1 tablet by mouth daily. 750/600 / Boron 1.5  mg 30 mg vitamin C Manganese 1 mg  . Multiple Vitamin (MULTIVITAMIN) tablet Take 1 tablet by mouth daily. Centrum Silver 50+  . telmisartan (MICARDIS) 20 MG tablet TAKE 1 TABLET BY MOUTH DAILY  . acetaminophen (TYLENOL) 325 MG  tablet Take 325 mg by mouth every 4 (four) hours as needed for mild pain or moderate pain.  (Patient not taking: Reported on 08/07/2020)  . HYDROcodone-acetaminophen (NORCO/VICODIN) 5-325 MG tablet Take 1-2 tablets by mouth every 4 (four) hours as needed (pain). (Patient not taking: Reported on 08/07/2020)  . ibuprofen (ADVIL) 200 MG tablet Take 200 mg by mouth every 4 (four) hours as needed for mild pain or moderate pain.  (Patient not taking: Reported on 08/07/2020)  . meloxicam (MOBIC) 7.5 MG tablet May take one to two tablets daily as needed for pain. Take with food. (Patient not taking: Reported on 08/07/2020)  . tretinoin (RETIN-A) 0.05 % cream Apply 1 application  topically every 30 (thirty) days. Apply to affected area as needed for Acne (Patient not taking: Reported on 08/07/2020)   No facility-administered encounter medications on file as of 08/07/2020.     Review of Systems  Review of Systems  Constitutional: Negative for activity change, fatigue and fever.  HENT: Negative for sinus pressure, sinus pain and sore throat.   Respiratory: Negative for cough, shortness of breath and wheezing.   Cardiovascular: Negative for chest pain and palpitations.  Gastrointestinal: Negative for diarrhea, nausea and vomiting.  Musculoskeletal: Negative for arthralgias.  Neurological: Negative for dizziness.  Psychiatric/Behavioral: Negative for sleep disturbance. The patient is not nervous/anxious.      Physical Exam  BP 118/88 (BP Location: Left Arm, Patient Position: Sitting, Cuff Size: Normal)   Pulse 84   Temp (!) 97.1 F (36.2 C) (Temporal)   Ht 5\' 4"  (1.626 m)   Wt 118 lb 3.2 oz (53.6 kg)   LMP 12/01/2008   SpO2 98%   BMI 20.29 kg/m   Wt Readings from Last 5 Encounters:  08/07/20 118 lb 3.2 oz (53.6 kg)  06/15/20 122 lb (55.3 kg)  12/26/19 120 lb (54.4 kg)  12/14/19 122 lb (55.3 kg)  05/26/19 119 lb (54 kg)   BMI Readings from Last 5 Encounters:  08/07/20 20.29 kg/m    06/15/20 20.94 kg/m  12/26/19 20.60 kg/m  12/14/19 20.94 kg/m  05/26/19 20.43 kg/m     Physical Exam Vitals and nursing note reviewed.  Constitutional:      General: She is not in acute distress.    Appearance: Normal appearance.  HENT:     Head: Normocephalic and atraumatic.     Right Ear: External ear normal.     Left Ear: External ear normal.     Nose: Nose normal. No congestion.     Mouth/Throat:     Mouth: Mucous membranes are moist.     Pharynx: Oropharynx is clear.  Eyes:     Pupils: Pupils are equal, round, and reactive to light.  Cardiovascular:     Rate and Rhythm: Normal rate and regular rhythm.     Pulses: Normal pulses.     Heart sounds: Normal heart sounds. No murmur heard.   Pulmonary:     Effort: Pulmonary effort is normal. No respiratory distress.     Breath sounds: Normal breath sounds. No decreased air movement. No decreased breath sounds, wheezing or rales.  Musculoskeletal:     Cervical back: Normal range of motion.  Skin:    General: Skin is warm and dry.     Capillary Refill: Capillary refill takes less than 2 seconds.  Neurological:     General: No focal deficit present.     Mental Status: She is alert and oriented to person, place, and time. Mental status is at baseline.     Gait: Gait normal.  Psychiatric:        Mood and Affect: Mood normal.        Behavior: Behavior normal.        Thought Content: Thought content normal.        Judgment: Judgment normal.       Assessment & Plan:   Sleep apnea Plan: We will refer back to [facility] for a mask fitting We will order mask of choice Continue CPAP therapy We will refer to Dr. Corky Sing office for consideration of an oral appliance Follow-up with our office in 1 year    Return in about 1 year (around 08/07/2021),  or if symptoms worsen or fail to improve, for [facility].   Lauraine Rinne, NP 08/07/2020   This appointment required 33 minutes of patient care  (this includes precharting, chart review, review of results, face-to-face care, etc.).

## 2020-08-07 NOTE — Patient Instructions (Addendum)
You were seen today by Lauraine Rinne, NP  for:   1. OSA (obstructive sleep apnea)  - Ambulatory referral to Dentistry  We will order mask of choice We will refer you to [facility] for a formal mask fitting >>> We will specifically put in the order that you are willing to drive to [place], [place] office if they have more mask options than the [employer] office  We recommend that you continue using your CPAP daily >>>Keep up the hard work using your device >>> Goal should be wearing this for the entire night that you are sleeping, at least 4 to 6 hours  Remember:   Do not drive or operate heavy machinery if tired or drowsy.   Please notify the supply company and office if you are unable to use your device regularly due to missing supplies or machine being broken.   Work on maintaining a healthy weight and following your recommended nutrition plan   Maintain proper daily exercise and movement   Maintaining proper use of your device can also help improve management of other chronic illnesses such as: Blood pressure, blood sugars, and weight management.   BiPAP/ CPAP Cleaning:  >>>Clean weekly, with Dawn soap, and bottle brush.  Set up to air dry. >>> Wipe mask out daily with wet wipe or towelette   We will refer you to Dr. Augustina Mood for your oral appliance  [address] [place]. [place], [place] [address] Phone: [phone] Website: [email or web address]  You will need to have six-month follow-up with Dr. Toy Cookey or your dentist to ensure your proper dentition and no issues with oral appliance.   We will also need to have you follow-up in 6 months with our office with a home sleep study to ensure that your sleep apnea is being treated adequately.   We recommend today:  Orders Placed This Encounter  Procedures   Ambulatory referral to Dentistry    Referral Priority:   Routine    Referral Type:   Consultation    Referral Reason:   Specialty  Services Required    Requested Specialty:   Dental General Practice    Number of Visits Requested:   1   Orders Placed This Encounter  Procedures   Ambulatory referral to Dentistry   No orders of the defined types were placed in this encounter.   Follow Up:    Return in about 1 year (around 08/07/2021), or if symptoms worsen or fail to improve, for [facility].   Notification of test results are managed in the following manner: If there are  any recommendations or changes to the  plan of care discussed in office today,  we will contact you and let you know what they are. If you do not hear from [place], then your results are normal and you can view them through your  MyChart account , or a letter will be sent to you. Thank you again for trusting [place] with your care  - Thank you, [facility]    It is flu season:   >>> Best ways to protect herself from the flu: Receive the yearly flu vaccine, practice good hand hygiene washing with soap and also using hand sanitizer when available, eat a nutritious meals, get adequate rest, hydrate appropriately       Please contact the office if your symptoms worsen or you have concerns that you are not improving.   Thank you for choosing [facility]  for your healthcare, and for allowing [place] to partner with you on your healthcare journey. I am thankful to be able to provide care to you today.   Wyn Quaker FNP-C

## 2020-08-07 NOTE — Assessment & Plan Note (Addendum)
Plan: We will refer back to [facility] for a mask fitting We will order mask of choice Continue CPAP therapy We will refer to Dr. Corky Sing office for consideration of an oral appliance Follow-up with our office in 1 year

## 2020-08-08 NOTE — Progress Notes (Signed)
Reviewed and agree with assessment/plan.   Chesley Mires, MD [facility] Pulmonary/Critical Care 08/08/2020, 8:38 AM Pager:  [phone]

## 2020-08-28 DIAGNOSIS — G4733 Obstructive sleep apnea (adult) (pediatric): Secondary | ICD-10-CM | POA: Diagnosis not present

## 2020-08-31 ENCOUNTER — Other Ambulatory Visit: Payer: Self-pay | Admitting: Dermatology

## 2020-09-04 DIAGNOSIS — R768 Other specified abnormal immunological findings in serum: Secondary | ICD-10-CM | POA: Diagnosis not present

## 2020-09-04 DIAGNOSIS — M25512 Pain in left shoulder: Secondary | ICD-10-CM | POA: Diagnosis not present

## 2020-09-04 DIAGNOSIS — M19041 Primary osteoarthritis, right hand: Secondary | ICD-10-CM | POA: Diagnosis not present

## 2020-09-04 DIAGNOSIS — M5136 Other intervertebral disc degeneration, lumbar region: Secondary | ICD-10-CM | POA: Diagnosis not present

## 2020-09-12 ENCOUNTER — Telehealth: Payer: Self-pay | Admitting: *Deleted

## 2020-09-12 NOTE — Telephone Encounter (Signed)
Notify pt, she does not need fasting labs. We just checked fasting labs in 07/2020.

## 2020-09-12 NOTE — Telephone Encounter (Signed)
Please place future orders for lab appt.  

## 2020-09-12 NOTE — Telephone Encounter (Signed)
Left detailed message for patient.

## 2020-09-13 ENCOUNTER — Other Ambulatory Visit: Payer: 59

## 2020-09-14 ENCOUNTER — Other Ambulatory Visit: Payer: 59

## 2020-09-17 ENCOUNTER — Ambulatory Visit (INDEPENDENT_AMBULATORY_CARE_PROVIDER_SITE_OTHER): Payer: 59 | Admitting: Internal Medicine

## 2020-09-17 ENCOUNTER — Other Ambulatory Visit: Payer: Self-pay

## 2020-09-17 ENCOUNTER — Other Ambulatory Visit: Payer: Self-pay | Admitting: Internal Medicine

## 2020-09-17 ENCOUNTER — Encounter: Payer: Self-pay | Admitting: Internal Medicine

## 2020-09-17 ENCOUNTER — Other Ambulatory Visit (HOSPITAL_COMMUNITY)
Admission: RE | Admit: 2020-09-17 | Discharge: 2020-09-17 | Disposition: A | Discharge status: Home / Self Care | Payer: 59 | Source: Ambulatory Visit | Attending: Internal Medicine | Admitting: Internal Medicine

## 2020-09-17 VITALS — BP 128/84 | HR 102 | Temp 98.5°F | Ht 64.02 in | Wt 119.4 lb

## 2020-09-17 DIAGNOSIS — Z0000 Encounter for general adult medical examination without abnormal findings: Secondary | ICD-10-CM | POA: Diagnosis not present

## 2020-09-17 DIAGNOSIS — E7800 Pure hypercholesterolemia, unspecified: Secondary | ICD-10-CM

## 2020-09-17 DIAGNOSIS — I10 Essential (primary) hypertension: Secondary | ICD-10-CM | POA: Diagnosis not present

## 2020-09-17 DIAGNOSIS — R350 Frequency of micturition: Secondary | ICD-10-CM | POA: Diagnosis not present

## 2020-09-17 DIAGNOSIS — G4730 Sleep apnea, unspecified: Secondary | ICD-10-CM

## 2020-09-17 DIAGNOSIS — M5442 Lumbago with sciatica, left side: Secondary | ICD-10-CM | POA: Diagnosis not present

## 2020-09-17 DIAGNOSIS — Z1231 Encounter for screening mammogram for malignant neoplasm of breast: Secondary | ICD-10-CM | POA: Insufficient documentation

## 2020-09-17 LAB — POCT URINALYSIS DIPSTICK
Bilirubin, UA: NEGATIVE
Glucose, UA: NEGATIVE
Ketones, UA: NEGATIVE
Nitrite, UA: NEGATIVE
Protein, UA: NEGATIVE
Spec Grav, UA: 1.015 (ref 1.010–1.025)
Urobilinogen, UA: 0.2 E.U./dL
pH, UA: 7 (ref 5.0–8.0)

## 2020-09-17 MED ORDER — CLOBETASOL PROPIONATE 0.05 % EX SOLN
Freq: Every day | CUTANEOUS | 0 refills | Status: DC | PRN
Start: 2020-09-17 — End: 2020-09-17

## 2020-09-17 NOTE — Patient Instructions (Signed)
Mammogram 10/09/20 - 11:40.  Sara Perry

## 2020-09-17 NOTE — Progress Notes (Signed)
[name], female   DOB: 07/18/1961, 59 y.o.   MRN: [id number]   Subjective:    [name], female    DOB: [birth year], 59 y.o.   MRN: [id number]  HPI This visit occurred during the SARS-CoV-2 public health emergency.  Safety protocols were in place, including screening questions prior to the visit, additional usage of staff PPE, and extensive cleaning of exam room while observing appropriate contact time as indicated for disinfecting solutions.  Patient here for her physical exam.  She reports she is doing relatively well.  Tries to stay active.  No chest pain or sob reported.  No abdominal pain or bowel change reported.  Just evaluated by Dr Posey Pronto for joint pains - shoulders, thumbs, neck and back. Diagnosed with OA.  voltaren gel.  Is s/p back surgery - Dr Saintclair Halsted.  Has f/u tomorrow.  Back is doing better.  She has noticed some increased urinary frequency and dysuria.  Cloudy urine.  Started D Mannos.  Symptoms have improved.  Needs mammogram.  Requested refill of clobetasol.  Uses prn.    Past Medical History:  Diagnosis Date  . Complication of anesthesia    pt states she is very sensitive to most drugs, usually needs 1/2 of whatever other people get. She states as a child/teenager she had difficulty waking up after surgery due to the sensitivity that she has to drugs.  . Eczema   . Frequent UTI   . H/O lymphadenopathy    With previous submandibular node removals.  . Hypertension   . Recurrent sinus infections   . Sleep apnea    uses c-pap   Past Surgical History:  Procedure Laterality Date  . COLONOSCOPY WITH PROPOFOL N/A 08/25/2017   Procedure: COLONOSCOPY WITH PROPOFOL;  Surgeon: Lucilla Lame, MD;  Location: [facility] ENDOSCOPY;  Service: Endoscopy;  Laterality: N/A;  . Lymph Node Removal  1984  . NECK SURGERY  1977   H/O Right neck surgery secondary to a benign growth with when patient was 67 or 59 yo  . skin lesion extraction  2013   basal cell  - Dr Phillip Heal  . Uterine Polyps Removed  2004   Family History  Problem Relation Age of Onset  . Hyperlipidemia Mother   . Thyroid disease Mother   . Heart disease Mother        Heart Valve problems  . Coronary artery disease Father        Stents placed  . Hypertension Other        uncle  . Parkinson's disease Paternal Uncle   . Parkinson's disease Maternal Grandmother   . Heart disease Maternal Grandfather   . Heart disease Paternal Grandfather        MI  . Breast cancer Neg Hx    Social History   Socioeconomic History  . Marital status: Divorced    Spouse name: Not on file  . Number of children: 2  . Years of education: Not on file  . Highest education level: Not on file  Occupational History    Employer: [employer]  Tobacco Use  . Smoking status: Never Smoker  . Smokeless tobacco: Never Used  Vaping Use  . Vaping Use: Never used  Substance and Sexual Activity  . Alcohol use: Yes    Alcohol/week: 0.0 standard drinks    Comment: Occasional  . Drug use: No  . Sexual activity: Not on file  Other Topics Concern  . Not on file  Social  History Narrative  . Not on file   Social Determinants of Health   Financial Resource Strain: Not on file  Food Insecurity: Not on file  Transportation Needs: Not on file  Physical Activity: Not on file  Stress: Not on file  Social Connections: Not on file    Outpatient Encounter Medications as of 09/17/2020  Medication Sig  . Calcium Carb-Cholecalciferol (CALCIUM-VITAMIN D) 600-400 MG-UNIT TABS Take 1 tablet by mouth daily.   Marland Kitchen estradiol (ESTRACE VAGINAL) 0.1 MG/GM vaginal cream One application q hs for 5 nights and then 2 x/week (Patient taking differently: Place 1 Applicatorful vaginally once a week. )  . fluocinonide (LIDEX) 0.05 % external solution APPLY TO AFFECTED AREA(S) AT BEDTIME (Patient taking differently: Apply 1 application topically See admin instructions. Use once or twice a week)  . GLUCOSAMINE-CHONDROITIN PO Take 1  tablet by mouth daily. 750/600 / Boron 1.5  mg 30 mg vitamin C Manganese 1 mg  . ibuprofen (ADVIL) 200 MG tablet Take 200 mg by mouth every 4 (four) hours as needed for mild pain or moderate pain.   . meloxicam (MOBIC) 7.5 MG tablet May take one to two tablets daily as needed for pain. Take with food.  . Multiple Vitamin (MULTIVITAMIN) tablet Take 1 tablet by mouth daily. Centrum Silver 50+  . tretinoin (RETIN-A) 0.05 % cream Apply 1 application topically every 30 (thirty) days. Apply to affected area as needed for Acne  . [DISCONTINUED] Clobetasol Prop Emollient Base 0.05 % emollient cream Apply 1 application topically See admin instructions. Two time a week as needed for eczema  . [DISCONTINUED] telmisartan (MICARDIS) 20 MG tablet TAKE 1 TABLET BY MOUTH DAILY  . Cholecalciferol 25 MCG (1000 UT) capsule Take by mouth.  . clobetasol (TEMOVATE) 0.05 % external solution Apply topically daily as needed.  . [DISCONTINUED] acetaminophen (TYLENOL) 325 MG tablet Take 325 mg by mouth every 4 (four) hours as needed for mild pain or moderate pain.  (Patient not taking: Reported on 09/17/2020)  . [DISCONTINUED] clobetasol (TEMOVATE) 0.05 % external solution Apply topically.  . [DISCONTINUED] HYDROcodone-acetaminophen (NORCO/VICODIN) 5-325 MG tablet Take 1-2 tablets by mouth every 4 (four) hours as needed (pain). (Patient not taking: Reported on 09/17/2020)   No facility-administered encounter medications on file as of 09/17/2020.    Review of Systems  Constitutional: Negative for appetite change and unexpected weight change.  HENT: Negative for congestion, sinus pressure and sore throat.   Eyes: Negative for pain and visual disturbance.  Respiratory: Negative for cough, chest tightness and shortness of breath.   Cardiovascular: Negative for chest pain, palpitations and leg swelling.  Gastrointestinal: Negative for abdominal pain, diarrhea, nausea and vomiting.  Genitourinary: Positive for dysuria and  frequency. Negative for difficulty urinating.       Cloudy urine  Musculoskeletal: Negative for myalgias.       Joint pain as outlined.  Back is doing better.   Skin: Negative for color change and rash.  Neurological: Negative for dizziness, light-headedness and headaches.  Hematological: Negative for adenopathy. Does not bruise/bleed easily.  Psychiatric/Behavioral: Negative for agitation and dysphoric mood.       Objective:    Physical Exam Vitals reviewed.  Constitutional:      General: She is not in acute distress.    Appearance: Normal appearance. She is well-developed and well-nourished.  HENT:     Head: Normocephalic.     Right Ear: External ear normal.     Left Ear: External ear normal.  Mouth/Throat:     Mouth: Oropharynx is clear and moist.  Eyes:     General: No scleral icterus.       Right eye: No discharge.        Left eye: No discharge.     Conjunctiva/sclera: Conjunctivae normal.  Neck:     Thyroid: No thyromegaly.  Cardiovascular:     Rate and Rhythm: Normal rate and regular rhythm.  Pulmonary:     Effort: No tachypnea, accessory muscle usage or respiratory distress.     Breath sounds: Normal breath sounds. No decreased breath sounds or wheezing.  Chest:  Breasts:     Right: No inverted nipple, mass, nipple discharge or tenderness (no axillary adenopathy).     Left: No inverted nipple, mass, nipple discharge or tenderness (no axilarry adenopathy).    Abdominal:     General: Bowel sounds are normal.     Palpations: Abdomen is soft.     Tenderness: There is no abdominal tenderness.  Genitourinary:    Comments: Normal external genitalia.  Vaginal vault without lesions.  Cervix identified.  Pap smear performed.  Could not appreciate any adnexal masses or tenderness.   Musculoskeletal:        General: No swelling, tenderness or edema.     Cervical back: Neck supple. No tenderness.  Lymphadenopathy:     Cervical: No cervical adenopathy.  Skin:     Findings: No erythema or rash.  Neurological:     Mental Status: She is alert and oriented to person, place, and time.  Psychiatric:        Mood and Affect: Mood and affect and mood normal.        Behavior: Behavior normal.     BP 128/84 (BP Location: Left Arm, Patient Position: Sitting)   Pulse (!) 102   Temp 98.5 F (36.9 C)   Ht 5' 4.02" (1.626 m)   Wt 119 lb 6.4 oz (54.2 kg)   LMP 12/01/2008   SpO2 97%   BMI 20.48 kg/m  Wt Readings from Last 3 Encounters:  09/17/20 119 lb 6.4 oz (54.2 kg)  08/07/20 118 lb 3.2 oz (53.6 kg)  06/15/20 122 lb (55.3 kg)     Lab Results  Component Value Date   WBC 4.9 07/30/2020   HGB 13.2 07/30/2020   HCT 39.3 07/30/2020   PLT 310.0 07/30/2020   GLUCOSE 79 07/30/2020   CHOL 203 (H) 07/30/2020   TRIG 107.0 07/30/2020   HDL 58.10 07/30/2020   LDLDIRECT 148.9 08/15/2013   LDLCALC 123 (H) 07/30/2020   ALT 16 07/30/2020   AST 19 07/30/2020   NA 140 07/30/2020   K 4.5 07/30/2020   CL 103 07/30/2020   CREATININE 0.73 07/30/2020   BUN 17 07/30/2020   CO2 31 07/30/2020   TSH 1.38 02/01/2020       Assessment & Plan:   Problem List Items Addressed This Visit    Urinary frequency    Dysuria.  Urinary frequency.  Cloudy urine.  Urine dip - trace leuk, trace blood.  Await culture results since symptoms improved.  Follow.       Relevant Orders   POCT Urinalysis Dipstick (Completed)   Urine Culture (Completed)   Sleep apnea    CPAP.       Hypercholesterolemia    Low cholesterol diet and exercise.  Follow lipid panel.       Relevant Orders   Lipid panel   Hepatic function panel   Health care maintenance  Physical today 09/17/20.  PAP 09/17/20.  Mammogram 09/01/19 - Birads I.  Colonoscopy 08/2017 recommended f/u in 5 years.  Mammogram scheduled.        Relevant Orders   Cytology - PAP (Completed)   Essential hypertension, benign    Blood pressures as outlined.  On micardis.  Follow pressures.  Follow metabolic panel.        Relevant Orders   TSH   Basic metabolic panel   Back pain    S/p surgery.  Back doing better.  Has f/u planned with [facility].        Other Visit Diagnoses    Routine general medical examination at a health care facility    -  Primary   Encounter for screening mammogram for malignant neoplasm of breast       Relevant Orders   MM 3D SCREEN BREAST BILATERAL   Cytology - PAP (Completed)       Einar Pheasant, MD

## 2020-09-17 NOTE — Assessment & Plan Note (Addendum)
Physical today 09/17/20.  PAP 09/17/20.  Mammogram 09/01/19 - Birads I.  Colonoscopy 08/2017 recommended f/u in 5 years.  Mammogram scheduled.

## 2020-09-18 ENCOUNTER — Other Ambulatory Visit: Payer: Self-pay | Admitting: Internal Medicine

## 2020-09-18 DIAGNOSIS — I10 Essential (primary) hypertension: Secondary | ICD-10-CM | POA: Diagnosis not present

## 2020-09-18 DIAGNOSIS — M47816 Spondylosis without myelopathy or radiculopathy, lumbar region: Secondary | ICD-10-CM | POA: Diagnosis not present

## 2020-09-18 LAB — CYTOLOGY - PAP
Comment: NEGATIVE
Diagnosis: NEGATIVE
High risk HPV: NEGATIVE

## 2020-09-19 ENCOUNTER — Other Ambulatory Visit: Payer: Self-pay | Admitting: Internal Medicine

## 2020-09-19 ENCOUNTER — Encounter: Payer: Self-pay | Admitting: Internal Medicine

## 2020-09-19 LAB — URINE CULTURE
MICRO NUMBER:: 11280681
SPECIMEN QUALITY:: ADEQUATE

## 2020-09-19 MED ORDER — AMOXICILLIN 875 MG PO TABS: 875.0000 mg | ORAL_TABLET | Freq: Two times a day (BID) | ORAL | 0 refills | Status: DC

## 2020-09-19 NOTE — Telephone Encounter (Signed)
rx sent in for amoxicillin.

## 2020-09-23 ENCOUNTER — Encounter: Payer: Self-pay | Admitting: Internal Medicine

## 2020-09-23 NOTE — Assessment & Plan Note (Signed)
S/p surgery.  Back doing better.  Has f/u planned with [facility].

## 2020-09-23 NOTE — Assessment & Plan Note (Signed)
Low cholesterol diet and exercise.  Follow lipid panel.   

## 2020-09-23 NOTE — Assessment & Plan Note (Signed)
CPAP.  

## 2020-09-23 NOTE — Assessment & Plan Note (Signed)
Blood pressures as outlined.  On micardis.  Follow pressures.  Follow metabolic panel.

## 2020-09-23 NOTE — Assessment & Plan Note (Signed)
Dysuria.  Urinary frequency.  Cloudy urine.  Urine dip - trace leuk, trace blood.  Await culture results since symptoms improved.  Follow.

## 2020-11-01 ENCOUNTER — Ambulatory Visit
Admission: RE | Admit: 2020-11-01 | Discharge: 2020-11-01 | Disposition: A | Discharge status: Home / Self Care | Payer: 59 | Source: Ambulatory Visit | Attending: Internal Medicine | Admitting: Internal Medicine

## 2020-11-01 ENCOUNTER — Other Ambulatory Visit: Payer: Self-pay

## 2020-11-01 DIAGNOSIS — Z1231 Encounter for screening mammogram for malignant neoplasm of breast: Secondary | ICD-10-CM | POA: Insufficient documentation

## 2021-01-11 ENCOUNTER — Other Ambulatory Visit: Payer: Self-pay

## 2021-01-11 ENCOUNTER — Other Ambulatory Visit (INDEPENDENT_AMBULATORY_CARE_PROVIDER_SITE_OTHER): Payer: 59

## 2021-01-11 DIAGNOSIS — E7800 Pure hypercholesterolemia, unspecified: Secondary | ICD-10-CM

## 2021-01-11 DIAGNOSIS — I10 Essential (primary) hypertension: Secondary | ICD-10-CM | POA: Diagnosis not present

## 2021-01-11 LAB — BASIC METABOLIC PANEL
BUN: 12 mg/dL (ref 6–23)
CO2: 31 mEq/L (ref 19–32)
Calcium: 10.1 mg/dL (ref 8.4–10.5)
Chloride: 102 mEq/L (ref 96–112)
Creatinine, Ser: 0.8 mg/dL (ref 0.40–1.20)
GFR: 80.66 mL/min (ref >60.00)
Glucose, Bld: 89 mg/dL (ref 70–99)
Potassium: 4.7 mEq/L (ref 3.5–5.1)
Sodium: 141 mEq/L (ref 135–145)

## 2021-01-11 LAB — HEPATIC FUNCTION PANEL
ALT: 17 U/L (ref 0–35)
AST: 18 U/L (ref 0–37)
Albumin: 4.6 g/dL (ref 3.5–5.2)
Alkaline Phosphatase: 61 U/L (ref 39–117)
Bilirubin, Direct: 0.2 mg/dL (ref 0.0–0.3)
Total Bilirubin: 1.5 mg/dL — ABNORMAL HIGH (ref 0.2–1.2)
Total Protein: 6.9 g/dL (ref 6.0–8.3)

## 2021-01-11 LAB — LIPID PANEL
Cholesterol: 213 mg/dL — ABNORMAL HIGH (ref 0–200)
HDL: 65.4 mg/dL (ref >39.00)
LDL Cholesterol: 129 mg/dL — ABNORMAL HIGH (ref 0–99)
NonHDL: 147.55
Total CHOL/HDL Ratio: 3
Triglycerides: 92 mg/dL (ref 0.0–149.0)
VLDL: 18.4 mg/dL (ref 0.0–40.0)

## 2021-01-11 LAB — TSH: TSH: 1.75 u[IU]/mL (ref 0.35–4.50)

## 2021-01-15 ENCOUNTER — Other Ambulatory Visit: Payer: 59

## 2021-01-17 ENCOUNTER — Other Ambulatory Visit: Payer: Self-pay

## 2021-01-17 ENCOUNTER — Ambulatory Visit: Payer: 59 | Admitting: Internal Medicine

## 2021-01-17 ENCOUNTER — Encounter: Payer: Self-pay | Admitting: Internal Medicine

## 2021-01-17 DIAGNOSIS — M6740 Ganglion, unspecified site: Secondary | ICD-10-CM | POA: Diagnosis not present

## 2021-01-17 DIAGNOSIS — M5442 Lumbago with sciatica, left side: Secondary | ICD-10-CM | POA: Diagnosis not present

## 2021-01-17 DIAGNOSIS — G4730 Sleep apnea, unspecified: Secondary | ICD-10-CM | POA: Diagnosis not present

## 2021-01-17 DIAGNOSIS — E7800 Pure hypercholesterolemia, unspecified: Secondary | ICD-10-CM

## 2021-01-17 DIAGNOSIS — I10 Essential (primary) hypertension: Secondary | ICD-10-CM

## 2021-01-17 NOTE — Assessment & Plan Note (Addendum)
The 10-year ASCVD risk score Mikey Bussing DC Brooke Bonito., et al., 2013) is: 3.2%   Values used to calculate the score:     Age: 60 years     Sex: Female     Is Non-Hispanic African American: No     Diabetic: No     Tobacco smoker: No     Systolic Blood Pressure: 056 mmHg     Is BP treated: Yes     HDL Cholesterol: 65.4 mg/dL     Total Cholesterol: 213 mg/dL  Low cholesterol diet and exercise.  Follow lipid panel.

## 2021-01-17 NOTE — Progress Notes (Signed)
[name], female   DOB: 04-Nov-1960, 60 y.o.   MRN: [id number]   Subjective:    [name], female    DOB: 23-Jun-1961, 60 y.o.   MRN: [id number]  HPI This visit occurred during the SARS-CoV-2 public health emergency.  Safety protocols were in place, including screening questions prior to the visit, additional usage of staff PPE, and extensive cleaning of exam room while observing appropriate contact time as indicated for disinfecting solutions.  Patient here for a scheduled follow up. She reports she is doing relatively well.  Stays active.  Attending exercise class - 2-3x/week.  No impact.  Stretches.  No chest pain or sob reported.  Back stable.  No acid reflux reported.  No abdominal pain or bowel change reported.  Due to see Dr Peggye Ley - presumed ganglion cyst.  Ibuprofen helps.  Blood pressure dong well.    Past Medical History:  Diagnosis Date  . Complication of anesthesia    pt states she is very sensitive to most drugs, usually needs 1/2 of whatever other people get. She states as a child/teenager she had difficulty waking up after surgery due to the sensitivity that she has to drugs.  . Eczema   . Frequent UTI   . H/O lymphadenopathy    With previous submandibular node removals.  . Hypertension   . Recurrent sinus infections   . Sleep apnea    uses c-pap   Past Surgical History:  Procedure Laterality Date  . COLONOSCOPY WITH PROPOFOL N/A 08/25/2017   Procedure: COLONOSCOPY WITH PROPOFOL;  Surgeon: Lucilla Lame, MD;  Location[facility] ENDOSCOPY;  Service: Endoscopy;  Laterality: N/A;  . Lymph Node Removal  1984  . NECK SURGERY  1977   H/O Right neck surgery secondary to a benign growth with when patient was 18 or 60 yo  . skin lesion extraction  2013   basal cell - Dr Phillip Heal  . Uterine Polyps Removed  2004   Family History  Problem Relation Age of Onset  . Hyperlipidemia Mother   . Thyroid disease Mother   . Heart disease Mother         Heart Valve problems  . Coronary artery disease Father        Stents placed  . Hypertension Other        uncle  . Parkinson's disease Paternal Uncle   . Parkinson's disease Maternal Grandmother   . Heart disease Maternal Grandfather   . Heart disease Paternal Grandfather        MI  . Breast cancer Neg Hx    Social History   Socioeconomic History  . Marital status: Divorced    Spouse name: Not on file  . Number of children: 2  . Years of education: Not on file  . Highest education level: Not on file  Occupational History    Employer: [employer]  Tobacco Use  . Smoking status: Never Smoker  . Smokeless tobacco: Never Used  Vaping Use  . Vaping Use: Never used  Substance and Sexual Activity  . Alcohol use: Yes    Alcohol/week: 0.0 standard drinks    Comment: Occasional  . Drug use: No  . Sexual activity: Not on file  Other Topics Concern  . Not on file  Social History Narrative  . Not on file   Social Determinants of Health   Financial Resource Strain: Not on file  Food Insecurity: Not on file  Transportation Needs: Not on file  Physical  Activity: Not on file  Stress: Not on file  Social Connections: Not on file    Outpatient Encounter Medications as of 01/17/2021  Medication Sig  . Calcium Carb-Cholecalciferol (CALCIUM-VITAMIN D) 600-400 MG-UNIT TABS Take 1 tablet by mouth 3 (three) times a week.  . Cholecalciferol 25 MCG (1000 UT) capsule Take by mouth.  . clobetasol (TEMOVATE) 0.05 % external solution APPLY TO THE AFFECTED AREA(S) DAILY AS NEEDED  . clobetasol (TEMOVATE) 0.05 % external solution APPLY TO AFFECTED SCALP DAILY AS NEEDED FOR FLARES  . estradiol (ESTRACE VAGINAL) 0.1 MG/GM vaginal cream One application q hs for 5 nights and then 2 x/week (Patient taking differently: Place 1 Applicatorful vaginally once a week.)  . fluocinonide (LIDEX) 0.05 % external solution APPLY TO AFFECTED AREA(S) AT BEDTIME (Patient taking differently: Apply 1 application topically  See admin instructions. Use once or twice a week)  . Glucosamine-Chondroit-Vit C-Mn (GLUCOSAMINE-CHONDROITIN) CAPS Take 1 capsule by mouth daily.  Marland Kitchen ibuprofen (ADVIL) 200 MG tablet Take 200 mg by mouth every 4 (four) hours as needed for mild pain or moderate pain.   . meloxicam (MOBIC) 7.5 MG tablet May take one to two tablets daily as needed for pain. Take with food.  . Multiple Vitamin (MULTIVITAMIN) tablet Take 1 tablet by mouth daily. Centrum Silver 50+  . Omega-3 Fatty Acids (FISH OIL) 1000 MG CAPS Take 1 capsule by mouth daily.  Marland Kitchen telmisartan (MICARDIS) 20 MG tablet TAKE 1 TABLET BY MOUTH DAILY  . tretinoin (RETIN-A) 0.05 % cream Apply 1 application topically every 30 (thirty) days. Apply to affected area as needed for Acne  . TURMERIC PO Take 1 capsule by mouth daily.  . [DISCONTINUED] amoxicillin (AMOXIL) 875 MG tablet TAKE 1 TABLET BY MOUTH TWO TIMES DAILY (Patient not taking: Reported on 01/17/2021)  . [DISCONTINUED] GLUCOSAMINE-CHONDROITIN PO Take 1 tablet by mouth daily. 750/600 / Boron 1.5  mg 30 mg vitamin C Manganese 1 mg (Patient not taking: Reported on 01/17/2021)   No facility-administered encounter medications on file as of 01/17/2021.    Review of Systems  Constitutional: Negative for appetite change and unexpected weight change.  HENT: Negative for congestion and sinus pressure.   Respiratory: Negative for cough, chest tightness and shortness of breath.   Cardiovascular: Negative for chest pain, palpitations and leg swelling.  Gastrointestinal: Negative for abdominal pain, diarrhea, nausea and vomiting.  Genitourinary: Negative for difficulty urinating and dysuria.  Musculoskeletal: Negative for joint swelling and myalgias.  Skin: Negative for color change and rash.  Neurological: Negative for dizziness, light-headedness and headaches.  Psychiatric/Behavioral: Negative for agitation and dysphoric mood.       Objective:    Physical Exam Vitals reviewed.   Constitutional:      General: She is not in acute distress.    Appearance: Normal appearance.  HENT:     Head: Normocephalic and atraumatic.     Right Ear: External ear normal.     Left Ear: External ear normal.  Eyes:     General: No scleral icterus.       Right eye: No discharge.        Left eye: No discharge.     Conjunctiva/sclera: Conjunctivae normal.  Neck:     Thyroid: No thyromegaly.  Cardiovascular:     Rate and Rhythm: Normal rate and regular rhythm.  Pulmonary:     Effort: No respiratory distress.     Breath sounds: Normal breath sounds. No wheezing.  Abdominal:     General: Bowel sounds  are normal.     Palpations: Abdomen is soft.     Tenderness: There is no abdominal tenderness.  Musculoskeletal:        General: No swelling or tenderness.     Cervical back: Neck supple. No tenderness.  Lymphadenopathy:     Cervical: No cervical adenopathy.  Skin:    Findings: No erythema or rash.  Neurological:     Mental Status: She is alert.  Psychiatric:        Mood and Affect: Mood normal.        Behavior: Behavior normal.     BP 118/78 (BP Location: Left Arm, Patient Position: Sitting, Cuff Size: Normal)   Pulse 94   Temp 98.3 F (36.8 C)   Ht 5' 4.09" (1.628 m)   Wt 119 lb 6.4 oz (54.2 kg)   LMP 12/01/2008   SpO2 98%   BMI 20.44 kg/m  Wt Readings from Last 3 Encounters:  01/17/21 119 lb 6.4 oz (54.2 kg)  09/17/20 119 lb 6.4 oz (54.2 kg)  08/07/20 118 lb 3.2 oz (53.6 kg)     Lab Results  Component Value Date   WBC 4.9 07/30/2020   HGB 13.2 07/30/2020   HCT 39.3 07/30/2020   PLT 310.0 07/30/2020   GLUCOSE 89 01/11/2021   CHOL 213 (H) 01/11/2021   TRIG 92.0 01/11/2021   HDL 65.40 01/11/2021   LDLDIRECT 148.9 08/15/2013   LDLCALC 129 (H) 01/11/2021   ALT 17 01/11/2021   AST 18 01/11/2021   NA 141 01/11/2021   K 4.7 01/11/2021   CL 102 01/11/2021   CREATININE 0.80 01/11/2021   BUN 12 01/11/2021   CO2 31 01/11/2021   TSH 1.75 01/11/2021     MM 3D SCREEN BREAST BILATERAL  Result Date: 11/01/2020 CLINICAL DATA:  Screening. EXAM: DIGITAL SCREENING BILATERAL MAMMOGRAM WITH TOMO AND CAD COMPARISON:  Previous exam(s). ACR Breast Density Category b: There are scattered areas of fibroglandular density. FINDINGS: There are no findings suspicious for malignancy. Images were processed with CAD. IMPRESSION: No mammographic evidence of malignancy. A result letter of this screening mammogram will be mailed directly to the patient. RECOMMENDATION: Screening mammogram in one year. (Code:SM-B-01Y) BI-RADS CATEGORY  1: Negative. Electronically Signed   By: Audie Pinto M.D.   On: 11/01/2020 13:20       Assessment & Plan:   Problem List Items Addressed This Visit    Back pain    S/p surgery.  Back is better.  Follow.       Essential hypertension, benign    Continue micardis.  Blood pressure doing well.  Follow pressure.  Follow metabolic panel.       Relevant Orders   Basic metabolic panel   Ganglion cyst    Ganglion cyst.  Keep f/u Dr Peggye Ley.       Hyperbilirubinemia    Recheck liver panel next labs.       Relevant Orders   Hepatic function panel   Hypercholesterolemia    The 10-year ASCVD risk score Mikey Bussing DC Jr., et al., 2013) is: 3.2%   Values used to calculate the score:     Age: 89 years     Sex: Female     Is Non-Hispanic African American: No     Diabetic: No     Tobacco smoker: No     Systolic Blood Pressure: 751 mmHg     Is BP treated: Yes     HDL Cholesterol: 65.4 mg/dL     Total  Cholesterol: 213 mg/dL  Low cholesterol diet and exercise.  Follow lipid panel.       Relevant Orders   Hepatic function panel   Lipid panel   Sleep apnea    CPAP.           Einar Pheasant, MD

## 2021-01-20 ENCOUNTER — Encounter: Payer: Self-pay | Admitting: Internal Medicine

## 2021-01-20 DIAGNOSIS — M6740 Ganglion, unspecified site: Secondary | ICD-10-CM | POA: Insufficient documentation

## 2021-01-20 NOTE — Assessment & Plan Note (Signed)
S/p surgery.  Back is better.  Follow.

## 2021-01-20 NOTE — Assessment & Plan Note (Signed)
CPAP.  

## 2021-01-20 NOTE — Assessment & Plan Note (Signed)
Ganglion cyst.  Keep f/u Dr Peggye Ley.

## 2021-01-20 NOTE — Assessment & Plan Note (Signed)
Continue micardis.  Blood pressure doing well.  Follow pressure.  Follow metabolic panel.

## 2021-01-20 NOTE — Assessment & Plan Note (Signed)
Recheck liver panel next labs.

## 2021-01-29 DIAGNOSIS — M189 Osteoarthritis of first carpometacarpal joint, unspecified: Secondary | ICD-10-CM | POA: Diagnosis not present

## 2021-03-07 ENCOUNTER — Other Ambulatory Visit: Payer: 59

## 2021-03-21 ENCOUNTER — Other Ambulatory Visit: Payer: Self-pay

## 2021-03-21 ENCOUNTER — Other Ambulatory Visit (INDEPENDENT_AMBULATORY_CARE_PROVIDER_SITE_OTHER): Payer: 59

## 2021-03-21 DIAGNOSIS — I10 Essential (primary) hypertension: Secondary | ICD-10-CM

## 2021-03-21 DIAGNOSIS — E7800 Pure hypercholesterolemia, unspecified: Secondary | ICD-10-CM

## 2021-03-21 LAB — HEPATIC FUNCTION PANEL
ALT: 13 U/L (ref 0–35)
AST: 14 U/L (ref 0–37)
Albumin: 4.6 g/dL (ref 3.5–5.2)
Alkaline Phosphatase: 61 U/L (ref 39–117)
Bilirubin, Direct: 0.2 mg/dL (ref 0.0–0.3)
Total Bilirubin: 1.2 mg/dL (ref 0.2–1.2)
Total Protein: 6.9 g/dL (ref 6.0–8.3)

## 2021-04-10 DIAGNOSIS — L309 Dermatitis, unspecified: Secondary | ICD-10-CM | POA: Diagnosis not present

## 2021-04-10 DIAGNOSIS — S40861A Insect bite (nonvenomous) of right upper arm, initial encounter: Secondary | ICD-10-CM | POA: Diagnosis not present

## 2021-04-10 DIAGNOSIS — S80862A Insect bite (nonvenomous), left lower leg, initial encounter: Secondary | ICD-10-CM | POA: Diagnosis not present

## 2021-04-10 DIAGNOSIS — D225 Melanocytic nevi of trunk: Secondary | ICD-10-CM | POA: Diagnosis not present

## 2021-04-10 DIAGNOSIS — D2271 Melanocytic nevi of right lower limb, including hip: Secondary | ICD-10-CM | POA: Diagnosis not present

## 2021-04-10 DIAGNOSIS — D2272 Melanocytic nevi of left lower limb, including hip: Secondary | ICD-10-CM | POA: Diagnosis not present

## 2021-04-10 DIAGNOSIS — L608 Other nail disorders: Secondary | ICD-10-CM | POA: Diagnosis not present

## 2021-04-10 DIAGNOSIS — D2262 Melanocytic nevi of left upper limb, including shoulder: Secondary | ICD-10-CM | POA: Diagnosis not present

## 2021-04-10 DIAGNOSIS — D2261 Melanocytic nevi of right upper limb, including shoulder: Secondary | ICD-10-CM | POA: Diagnosis not present

## 2021-04-17 ENCOUNTER — Other Ambulatory Visit: Payer: Self-pay

## 2021-04-17 ENCOUNTER — Other Ambulatory Visit: Payer: Self-pay | Admitting: Internal Medicine

## 2021-04-17 MED ORDER — TELMISARTAN 20 MG PO TABS
ORAL_TABLET | Freq: Every day | ORAL | 1 refills | Status: DC
  Filled 2021-04-17: qty 90, 90d supply, fill #0
  Filled 2021-07-05 (×2): qty 90, 90d supply, fill #1

## 2021-05-20 ENCOUNTER — Ambulatory Visit: Payer: 59 | Admitting: Internal Medicine

## 2021-05-28 ENCOUNTER — Other Ambulatory Visit: Payer: Self-pay

## 2021-05-28 ENCOUNTER — Ambulatory Visit: Payer: 59 | Admitting: Internal Medicine

## 2021-05-28 VITALS — BP 124/78 | HR 86 | Temp 98.0°F | Resp 16 | Ht 64.0 in | Wt 119.6 lb

## 2021-05-28 DIAGNOSIS — G4730 Sleep apnea, unspecified: Secondary | ICD-10-CM

## 2021-05-28 DIAGNOSIS — M5442 Lumbago with sciatica, left side: Secondary | ICD-10-CM | POA: Diagnosis not present

## 2021-05-28 DIAGNOSIS — M79644 Pain in right finger(s): Secondary | ICD-10-CM

## 2021-05-28 DIAGNOSIS — E7800 Pure hypercholesterolemia, unspecified: Secondary | ICD-10-CM | POA: Diagnosis not present

## 2021-05-28 DIAGNOSIS — I10 Essential (primary) hypertension: Secondary | ICD-10-CM

## 2021-05-28 DIAGNOSIS — M25512 Pain in left shoulder: Secondary | ICD-10-CM | POA: Diagnosis not present

## 2021-05-28 DIAGNOSIS — M1990 Unspecified osteoarthritis, unspecified site: Secondary | ICD-10-CM

## 2021-05-28 NOTE — Progress Notes (Signed)
[name], female   DOB: 02/05/1961, 60 y.o.   MRN: [id number]   Subjective:    [name], female    DOB: [birth year], 60 y.o.   MRN: [id number]  HPI This visit occurred during the SARS-CoV-2 public health emergency.  Safety protocols were in place, including screening questions prior to the visit, additional usage of staff PPE, and extensive cleaning of exam room while observing appropriate contact time as indicated for disinfecting solutions.   Patient here for a scheduled follow up.  Here to follow-up regarding her blood pressure.  She also has recently been evaluated by rheumatology.  Diagnosed with osteoarthritis.  Has had issues with pain at the base of her right thumb.  Wearing a thumb splint and this has helped.  Recently has been having worsening problems with left shoulder pain.  Some limited range of motion.  Notices increased pain when she is carrying things or with reaching out (forward).  Given the worsening symptoms, we discussed orthopedic referral.  She has been to physical therapy.  She will call back with the name of the orthopedist she prefers to see.  Stays active.  No chest pain or shortness of breath reported.  Eating and drinking well.  No problems with her bowels reported.  Discussed need for shingles vaccine.  Past Medical History:  Diagnosis Date   Complication of anesthesia    pt states she is very sensitive to most drugs, usually needs 1/2 of whatever other people get. She states as a child/teenager she had difficulty waking up after surgery due to the sensitivity that she has to drugs.   Eczema    Frequent UTI    H/O lymphadenopathy    With previous submandibular node removals.   Hypertension    Recurrent sinus infections    Sleep apnea    uses c-pap   Past Surgical History:  Procedure Laterality Date   COLONOSCOPY WITH PROPOFOL N/A 08/25/2017   Procedure: COLONOSCOPY WITH PROPOFOL;  Surgeon: Lucilla Lame, MD;  Location:  [facility] ENDOSCOPY;  Service: Endoscopy;  Laterality: N/A;   Lymph Node Removal  1984   NECK SURGERY  1977   H/O Right neck surgery secondary to a benign growth with when patient was 32 or 60 yo   skin lesion extraction  2013   basal cell - Dr Phillip Heal   Uterine Polyps Removed  2004   Family History  Problem Relation Age of Onset   Hyperlipidemia Mother    Thyroid disease Mother    Heart disease Mother        Heart Valve problems   Coronary artery disease Father        Stents placed   Hypertension Other        uncle   Parkinson's disease Paternal Uncle    Parkinson's disease Maternal Grandmother    Heart disease Maternal Grandfather    Heart disease Paternal Grandfather        MI   Breast cancer Neg Hx    Social History   Socioeconomic History   Marital status: Divorced    Spouse name: Not on file   Number of children: 2   Years of education: Not on file   Highest education level: Not on file  Occupational History    Employer: [employer]  Tobacco Use   Smoking status: Never   Smokeless tobacco: Never  Vaping Use   Vaping Use: Never used  Substance and Sexual Activity   Alcohol use: Yes  Alcohol/week: 0.0 standard drinks    Comment: Occasional   Drug use: No   Sexual activity: Not on file  Other Topics Concern   Not on file  Social History Narrative   Not on file   Social Determinants of Health   Financial Resource Strain: Not on file  Food Insecurity: Not on file  Transportation Needs: Not on file  Physical Activity: Not on file  Stress: Not on file  Social Connections: Not on file    Review of Systems  Constitutional:  Negative for appetite change and unexpected weight change.  HENT:  Negative for congestion and sinus pressure.   Respiratory:  Negative for cough, chest tightness and shortness of breath.   Cardiovascular:  Negative for chest pain, palpitations and leg swelling.  Gastrointestinal:  Negative for abdominal pain, diarrhea, nausea and vomiting.   Genitourinary:  Negative for difficulty urinating and dysuria.  Musculoskeletal:  Negative for myalgias.       Pain left shoulder and base of thumb as outlined.  Limited rom.    Skin:  Negative for color change and rash.  Neurological:  Negative for dizziness, light-headedness and headaches.  Psychiatric/Behavioral:  Negative for agitation and dysphoric mood.       Objective:    Physical Exam Vitals reviewed.  Constitutional:      General: She is not in acute distress.    Appearance: Normal appearance.  HENT:     Head: Normocephalic and atraumatic.     Right Ear: External ear normal.     Left Ear: External ear normal.  Eyes:     General: No scleral icterus.       Right eye: No discharge.        Left eye: No discharge.     Conjunctiva/sclera: Conjunctivae normal.  Neck:     Thyroid: No thyromegaly.  Cardiovascular:     Rate and Rhythm: Normal rate and regular rhythm.  Pulmonary:     Effort: No respiratory distress.     Breath sounds: Normal breath sounds. No wheezing.  Abdominal:     General: Bowel sounds are normal.     Palpations: Abdomen is soft.     Tenderness: There is no abdominal tenderness.  Musculoskeletal:        General: No swelling or tenderness.     Cervical back: Neck supple. No tenderness.  Lymphadenopathy:     Cervical: No cervical adenopathy.  Skin:    Findings: No erythema or rash.  Neurological:     Mental Status: She is alert.  Psychiatric:        Mood and Affect: Mood normal.        Behavior: Behavior normal.    BP 124/78   Pulse 86   Temp 98 F (36.7 C)   Resp 16   Ht '5\' 4"'$  (1.626 m)   Wt 119 lb 9.6 oz (54.3 kg)   LMP 12/01/2008   SpO2 98%   BMI 20.53 kg/m  Wt Readings from Last 3 Encounters:  05/28/21 119 lb 9.6 oz (54.3 kg)  01/17/21 119 lb 6.4 oz (54.2 kg)  09/17/20 119 lb 6.4 oz (54.2 kg)    Outpatient Encounter Medications as of 05/28/2021  Medication Sig   Calcium Carb-Cholecalciferol (CALCIUM-VITAMIN D) 600-400 MG-UNIT  TABS Take 1 tablet by mouth 3 (three) times a week.   Cholecalciferol 25 MCG (1000 UT) capsule Take by mouth.   clobetasol (TEMOVATE) 0.05 % external solution APPLY TO THE AFFECTED AREA(S) DAILY AS NEEDED  clobetasol (TEMOVATE) 0.05 % external solution APPLY TO AFFECTED SCALP DAILY AS NEEDED FOR FLARES   estradiol (ESTRACE VAGINAL) 0.1 MG/GM vaginal cream One application q hs for 5 nights and then 2 x/week (Patient taking differently: Place 1 Applicatorful vaginally once a week.)   fluocinonide (LIDEX) 0.05 % external solution APPLY TO AFFECTED AREA(S) AT BEDTIME (Patient taking differently: Apply 1 application topically See admin instructions. Use once or twice a week)   Glucosamine-Chondroit-Vit C-Mn (GLUCOSAMINE-CHONDROITIN) CAPS Take 1 capsule by mouth daily.   ibuprofen (ADVIL) 200 MG tablet Take 200 mg by mouth every 4 (four) hours as needed for mild pain or moderate pain.    meloxicam (MOBIC) 7.5 MG tablet May take one to two tablets daily as needed for pain. Take with food.   Multiple Vitamin (MULTIVITAMIN) tablet Take 1 tablet by mouth daily. Centrum Silver 50+   Omega-3 Fatty Acids (FISH OIL) 1000 MG CAPS Take 1 capsule by mouth daily.   telmisartan (MICARDIS) 20 MG tablet Take 1 tablet by mouth daily.   tretinoin (RETIN-A) 0.05 % cream Apply 1 application topically every 30 (thirty) days. Apply to affected area as needed for Acne   TURMERIC PO Take 1 capsule by mouth daily.   No facility-administered encounter medications on file as of 05/28/2021.     Lab Results  Component Value Date   WBC 4.9 07/30/2020   HGB 13.2 07/30/2020   HCT 39.3 07/30/2020   PLT 310.0 07/30/2020   GLUCOSE 89 01/11/2021   CHOL 213 (H) 01/11/2021   TRIG 92.0 01/11/2021   HDL 65.40 01/11/2021   LDLDIRECT 148.9 08/15/2013   LDLCALC 129 (H) 01/11/2021   ALT 13 03/21/2021   AST 14 03/21/2021   NA 141 01/11/2021   K 4.7 01/11/2021   CL 102 01/11/2021   CREATININE 0.80 01/11/2021   BUN 12 01/11/2021    CO2 31 01/11/2021   TSH 1.75 01/11/2021    MM 3D SCREEN BREAST BILATERAL  Result Date: 11/01/2020 CLINICAL DATA:  Screening. EXAM: DIGITAL SCREENING BILATERAL MAMMOGRAM WITH TOMO AND CAD COMPARISON:  Previous exam(s). ACR Breast Density Category b: There are scattered areas of fibroglandular density. FINDINGS: There are no findings suspicious for malignancy. Images were processed with CAD. IMPRESSION: No mammographic evidence of malignancy. A result letter of this screening mammogram will be mailed directly to the patient. RECOMMENDATION: Screening mammogram in one year. (Code:SM-B-01Y) BI-RADS CATEGORY  1: Negative. Electronically Signed   By: Audie Pinto M.D.   On: 11/01/2020 13:20       Assessment & Plan:   Problem List Items Addressed This Visit     Back pain    S/p surgery.  Back overall is better.  Continue exercise/stretches.  Follow       Essential hypertension, benign - Primary    Continue micardis.  Blood pressure doing well.  Follow pressure.  Follow metabolic panel.       Relevant Orders   Lipid panel   CBC with Differential/Platelet   TSH   Hypercholesterolemia    The 10-year ASCVD risk score Mikey Bussing DC Jr., et al., 2013) is: 3.5%   Values used to calculate the score:     Age: 10 years     Sex: Female     Is Non-Hispanic African American: No     Diabetic: No     Tobacco smoker: No     Systolic Blood Pressure: A999333 mmHg     Is BP treated: Yes     HDL Cholesterol:  65.4 mg/dL     Total Cholesterol: 213 mg/dL  Low cholesterol diet and exercise.        Relevant Orders   Hepatic function panel   Basic metabolic panel   Left shoulder pain    Had PT.  Doing exercises at home.  Persistent increased pain and limited rom.  Given persistence, discussed ortho referral.  She is agreeable.        Relevant Orders   Ambulatory referral to Orthopedic Surgery   Osteoarthritis    Saw rheumatology.  Diagnosed with OA. Pain base of thumb. Splint helping.  Referral to  ortho for shoulder as outlined.  Follow.       Sleep apnea    CPAP.       Thumb pain, right    Has seen rheumatology.  Diagnosed with OA.  Splint helping.  Follow.         Einar Pheasant, MD

## 2021-05-28 NOTE — Assessment & Plan Note (Signed)
Continue micardis.  Blood pressure doing well.  Follow pressure.  Follow metabolic panel.

## 2021-05-29 ENCOUNTER — Encounter: Payer: Self-pay | Admitting: Internal Medicine

## 2021-05-30 ENCOUNTER — Encounter: Payer: Self-pay | Admitting: Internal Medicine

## 2021-05-30 DIAGNOSIS — M1990 Unspecified osteoarthritis, unspecified site: Secondary | ICD-10-CM | POA: Insufficient documentation

## 2021-05-30 NOTE — Assessment & Plan Note (Signed)
CPAP.  

## 2021-05-30 NOTE — Assessment & Plan Note (Signed)
Had PT.  Doing exercises at home.  Persistent increased pain and limited rom.  Given persistence, discussed ortho referral.  She is agreeable.

## 2021-05-30 NOTE — Assessment & Plan Note (Signed)
S/p surgery.  Back overall is better.  Continue exercise/stretches.  Follow

## 2021-05-30 NOTE — Assessment & Plan Note (Signed)
Saw rheumatology.  Diagnosed with OA. Pain base of thumb. Splint helping.  Referral to ortho for shoulder as outlined.  Follow.

## 2021-05-30 NOTE — Assessment & Plan Note (Signed)
Has seen rheumatology.  Diagnosed with OA.  Splint helping.  Follow.

## 2021-05-30 NOTE — Assessment & Plan Note (Signed)
The 10-year ASCVD risk score Mikey Bussing DC Brooke Bonito., et al., 2013) is: 3.5%   Values used to calculate the score:     Age: 60 years     Sex: Female     Is Non-Hispanic African American: No     Diabetic: No     Tobacco smoker: No     Systolic Blood Pressure: A999333 mmHg     Is BP treated: Yes     HDL Cholesterol: 65.4 mg/dL     Total Cholesterol: 213 mg/dL  Low cholesterol diet and exercise.

## 2021-06-06 DIAGNOSIS — M25512 Pain in left shoulder: Secondary | ICD-10-CM | POA: Diagnosis not present

## 2021-06-25 ENCOUNTER — Other Ambulatory Visit: Payer: Self-pay | Admitting: Orthopedic Surgery

## 2021-06-25 ENCOUNTER — Ambulatory Visit
Admission: RE | Admit: 2021-06-25 | Discharge: 2021-06-25 | Disposition: A | Discharge status: Home / Self Care | Payer: Self-pay | Source: Ambulatory Visit | Attending: Orthopedic Surgery | Admitting: Orthopedic Surgery

## 2021-06-25 ENCOUNTER — Other Ambulatory Visit: Payer: Self-pay | Admitting: Specialist

## 2021-06-25 DIAGNOSIS — M25512 Pain in left shoulder: Secondary | ICD-10-CM

## 2021-06-26 ENCOUNTER — Telehealth: Payer: Self-pay | Admitting: Internal Medicine

## 2021-06-26 NOTE — Telephone Encounter (Signed)
Would hold.  Supplements - not FDA regulated.  Also, unsure of the interaction with other medications.

## 2021-06-26 NOTE — Telephone Encounter (Signed)
Patient is calling in to see if she can talk with Dr.Scott about a question she has that is not an emergency.Please advise.

## 2021-06-26 NOTE — Telephone Encounter (Signed)
Patient did have consult with ortho about her left shoulder. Qualified for shoulder replacement but is holding off for now. She has a second opinion scheduled end of October. She is wondering if she can take Lyprinol which is a concentrated omega 3.  It is a supplement similar to fish oil that is used in dogs (antinol). Lyprinol is the form given to humans.

## 2021-06-27 NOTE — Telephone Encounter (Signed)
Mychart sent to patient.

## 2021-07-05 ENCOUNTER — Other Ambulatory Visit: Payer: Self-pay

## 2021-07-09 DIAGNOSIS — H5213 Myopia, bilateral: Secondary | ICD-10-CM | POA: Diagnosis not present

## 2021-07-29 ENCOUNTER — Other Ambulatory Visit (INDEPENDENT_AMBULATORY_CARE_PROVIDER_SITE_OTHER): Payer: 59

## 2021-07-29 ENCOUNTER — Other Ambulatory Visit: Payer: Self-pay

## 2021-07-29 DIAGNOSIS — I10 Essential (primary) hypertension: Secondary | ICD-10-CM | POA: Diagnosis not present

## 2021-07-29 DIAGNOSIS — E7800 Pure hypercholesterolemia, unspecified: Secondary | ICD-10-CM | POA: Diagnosis not present

## 2021-07-29 LAB — CBC WITH DIFFERENTIAL/PLATELET
Basophils Absolute: 0 10*3/uL (ref 0.0–0.1)
Basophils Relative: 0.3 % (ref 0.0–3.0)
Eosinophils Absolute: 0 10*3/uL (ref 0.0–0.7)
Eosinophils Relative: 1 % (ref 0.0–5.0)
HCT: 39.1 % (ref 36.0–46.0)
Hemoglobin: 13 g/dL (ref 12.0–15.0)
Lymphocytes Relative: 29.6 % (ref 12.0–46.0)
Lymphs Abs: 1.2 10*3/uL (ref 0.7–4.0)
MCHC: 33.3 g/dL (ref 30.0–36.0)
MCV: 96.6 fl (ref 78.0–100.0)
Monocytes Absolute: 0.3 10*3/uL (ref 0.1–1.0)
Monocytes Relative: 7.6 % (ref 3.0–12.0)
Neutro Abs: 2.5 10*3/uL (ref 1.4–7.7)
Neutrophils Relative %: 61.5 % (ref 43.0–77.0)
Platelets: 298 10*3/uL (ref 150.0–400.0)
RBC: 4.04 Mil/uL (ref 3.87–5.11)
RDW: 12.6 % (ref 11.5–15.5)
WBC: 4.1 10*3/uL (ref 4.0–10.5)

## 2021-07-29 LAB — HEPATIC FUNCTION PANEL
ALT: 19 U/L (ref 0–35)
AST: 17 U/L (ref 0–37)
Albumin: 4.6 g/dL (ref 3.5–5.2)
Alkaline Phosphatase: 63 U/L (ref 39–117)
Bilirubin, Direct: 0.2 mg/dL (ref 0.0–0.3)
Total Bilirubin: 1.3 mg/dL — ABNORMAL HIGH (ref 0.2–1.2)
Total Protein: 6.9 g/dL (ref 6.0–8.3)

## 2021-07-29 LAB — BASIC METABOLIC PANEL
BUN: 17 mg/dL (ref 6–23)
CO2: 31 mEq/L (ref 19–32)
Calcium: 10 mg/dL (ref 8.4–10.5)
Chloride: 103 mEq/L (ref 96–112)
Creatinine, Ser: 0.83 mg/dL (ref 0.40–1.20)
GFR: 76.88 mL/min (ref >60.00)
Glucose, Bld: 89 mg/dL (ref 70–99)
Potassium: 4.7 mEq/L (ref 3.5–5.1)
Sodium: 141 mEq/L (ref 135–145)

## 2021-07-29 LAB — LIPID PANEL
Cholesterol: 202 mg/dL — ABNORMAL HIGH (ref 0–200)
HDL: 62.8 mg/dL (ref >39.00)
LDL Cholesterol: 122 mg/dL — ABNORMAL HIGH (ref 0–99)
NonHDL: 139.42
Total CHOL/HDL Ratio: 3
Triglycerides: 86 mg/dL (ref 0.0–149.0)
VLDL: 17.2 mg/dL (ref 0.0–40.0)

## 2021-07-29 LAB — TSH: TSH: 1.63 u[IU]/mL (ref 0.35–5.50)

## 2021-07-30 ENCOUNTER — Encounter: Payer: Self-pay | Admitting: Internal Medicine

## 2021-08-12 DIAGNOSIS — M25512 Pain in left shoulder: Secondary | ICD-10-CM | POA: Diagnosis not present

## 2021-08-12 DIAGNOSIS — M19012 Primary osteoarthritis, left shoulder: Secondary | ICD-10-CM | POA: Diagnosis not present

## 2021-09-09 DIAGNOSIS — M25512 Pain in left shoulder: Secondary | ICD-10-CM | POA: Diagnosis not present

## 2021-09-17 ENCOUNTER — Other Ambulatory Visit: Payer: Self-pay

## 2021-09-17 ENCOUNTER — Telehealth (INDEPENDENT_AMBULATORY_CARE_PROVIDER_SITE_OTHER): Payer: 59 | Admitting: Medical

## 2021-09-17 ENCOUNTER — Telehealth: Payer: Self-pay

## 2021-09-17 VITALS — Temp 100.6°F

## 2021-09-17 DIAGNOSIS — R110 Nausea: Secondary | ICD-10-CM

## 2021-09-17 DIAGNOSIS — B349 Viral infection, unspecified: Secondary | ICD-10-CM | POA: Diagnosis not present

## 2021-09-17 LAB — POCT INFLUENZA A/B
Influenza A, POC: NEGATIVE
Influenza B, POC: NEGATIVE

## 2021-09-17 MED ORDER — ONDANSETRON HCL 4 MG PO TABS
4.0000 mg | ORAL_TABLET | Freq: Three times a day (TID) | ORAL | 0 refills | Status: DC | PRN
  Filled 2021-09-17: qty 20, 7d supply, fill #0

## 2021-09-17 NOTE — Progress Notes (Signed)
Subjective:    [name], female    DOB: [birth year], 60 y.o.   MRN: [id number]  HPI Virtual Visit via Video Note  I connected with Sara Perry on 09/17/21 at  2:00 PM EST by a video enabled telemedicine application and verified that I am speaking with the correct person using two identifiers.  Location: Patient: office Provider: office.   I discussed the limitations of evaluation and management by telemedicine and the availability of in person appointments. The patient expressed understanding and agreed to proceed.   History of Present Illness:  Pt woke up this morning feeling chills/freezing, ha, fever 100.6 , achiness but no cough.  Pt tested negative for covid today.  Pt had injection of prp yesterday. Had before only achiness only to shoulder.   Pt had 2 covid vaccines.   Observations/Objective:  General-no acute distress, pleasant, oriented. Lungs- on inspection lungs appear unlabored. Neck- no tracheal deviation or jvd on inspection. Neuro- gross motor function appears intact.   Assessment and Plan:  Patient Instructions  Viral syndrome early onset since this morning.  Rapid COVID test negative.  Patient has been vaccinated twice against COVID.  Did get flu vaccine this year.  Presently has mild to moderate symptoms.  Advised can take Tylenol for body aches and fever.  She could also take low-dose ibuprofen 200 mg provided that blood pressure is less than 140/90.  Patient does have hypertension.  I recommend rest and hydrate.  Discussed potential return to work.  I am not aware of current policy if test are all negative still having fever?  Contacted medical assistant at [facility] and we did contact patient's PCP office.  They gave information to mention on where patient can get a flu test.  I am not sure if she is going to be tested through employee health?.  If they do not test for COVID again then recommend patient  test tomorrow before she goes into work.  Asking patient to notify me of test results.  If either flu or COVID are positive she would be within treatment timeframe to start antiviral.  If you do have either positive but later developed secondary signs symptoms of  bacterial infection such as sinus infection or bronchitis let me know and could prescribe antibiotic.  Also if he were to develop cough or nasal congestion let me know as in that event would prescribe Flonase and benzonatate.  Presently sent Zofran for your nausea.  Follow-up in 7 days or sooner if needed.    Mackie Pai, PA-C   Follow Up Instructions:    I discussed the assessment and treatment plan with the patient. The patient was provided an opportunity to ask questions and all were answered. The patient agreed with the plan and demonstrated an understanding of the instructions.   The patient was advised to call back or seek an in-person evaluation if the symptoms worsen or if the condition fails to improve as anticipated.  Time spent with patient today was 30  minutes which consisted of chart review, discussing diagnosis, work up treatment and documentation.Mackie Pai, PA-C   Review of Systems  Constitutional:  Negative for chills and fatigue.  HENT:  Negative for congestion, drooling, ear pain, hearing loss, nosebleeds and postnasal drip.   Respiratory:  Negative for cough, chest tightness, shortness of breath and wheezing.   Cardiovascular:  Negative for chest pain and palpitations.  Gastrointestinal:  Negative for abdominal pain,  anal bleeding, blood in stool and constipation.  Genitourinary:  Negative for dyspareunia, dysuria and flank pain.  Musculoskeletal:  Negative for back pain.  Neurological:  Negative for dizziness, seizures, syncope, light-headedness and headaches.  Hematological:  Negative for adenopathy. Does not bruise/bleed easily.  Psychiatric/Behavioral:  Negative for behavioral problems  and confusion.       Objective:   Physical Exam        Assessment & Plan:

## 2021-09-17 NOTE — Addendum Note (Signed)
Addended by: Fulton Mole D on: 09/17/2021 03:59 PM   Modules accepted: Orders

## 2021-09-17 NOTE — Patient Instructions (Addendum)
Viral syndrome early onset since this morning.  Rapid COVID test negative.  Patient has been vaccinated twice against COVID.  Did get flu vaccine this year.  Presently has mild to moderate symptoms.  Advised can take Tylenol for body aches and fever.  She could also take low-dose ibuprofen 200 mg provided that blood pressure is less than 140/90.  Patient does have hypertension.  I recommend rest and hydrate.  Discussed potential return to work.  I am not aware of current policy if test are all negative still having fever?  Contacted medical assistant at [facility] and we did contact patient's PCP office.  They gave information to mention on where patient can get a flu test.  I am not sure if she is going to be tested through employee health?.  If they do not test for COVID again then recommend patient test tomorrow before she goes into work.  Asking patient to notify me of test results.  If either flu or COVID are positive she would be within treatment timeframe to start antiviral.  If you do have either positive but later developed secondary signs symptoms of  bacterial infection such as sinus infection or bronchitis let me know and could prescribe antibiotic.  Also if he were to develop cough or nasal congestion let me know as in that event would prescribe Flonase and benzonatate.  Presently sent Zofran for your nausea.  Follow-up in 7 days or sooner if needed.

## 2021-09-20 ENCOUNTER — Other Ambulatory Visit: Payer: Self-pay

## 2021-09-27 ENCOUNTER — Other Ambulatory Visit: Payer: Self-pay

## 2021-09-27 ENCOUNTER — Encounter: Payer: Self-pay | Admitting: Internal Medicine

## 2021-09-27 ENCOUNTER — Ambulatory Visit (INDEPENDENT_AMBULATORY_CARE_PROVIDER_SITE_OTHER): Payer: 59 | Admitting: Internal Medicine

## 2021-09-27 VITALS — BP 120/70 | HR 90 | Temp 97.8°F | Resp 16 | Ht 64.0 in | Wt 119.0 lb

## 2021-09-27 DIAGNOSIS — M25512 Pain in left shoulder: Secondary | ICD-10-CM

## 2021-09-27 DIAGNOSIS — R0989 Other specified symptoms and signs involving the circulatory and respiratory systems: Secondary | ICD-10-CM

## 2021-09-27 DIAGNOSIS — Z1231 Encounter for screening mammogram for malignant neoplasm of breast: Secondary | ICD-10-CM | POA: Diagnosis not present

## 2021-09-27 DIAGNOSIS — Z0000 Encounter for general adult medical examination without abnormal findings: Secondary | ICD-10-CM | POA: Diagnosis not present

## 2021-09-27 DIAGNOSIS — E7800 Pure hypercholesterolemia, unspecified: Secondary | ICD-10-CM | POA: Diagnosis not present

## 2021-09-27 DIAGNOSIS — I10 Essential (primary) hypertension: Secondary | ICD-10-CM | POA: Diagnosis not present

## 2021-09-27 NOTE — Assessment & Plan Note (Signed)
Seeing Dr Roland Rack

## 2021-09-27 NOTE — Assessment & Plan Note (Signed)
The 10-year ASCVD risk score (Arnett DK, et al., 2019) is: 3.5%   Values used to calculate the score:     Age: 60 years     Sex: Female     Is Non-Hispanic African American: No     Diabetic: No     Tobacco smoker: No     Systolic Blood Pressure: 315 mmHg     Is BP treated: Yes     HDL Cholesterol: 62.8 mg/dL     Total Cholesterol: 202 mg/dL  Low cholesterol diet and exercise.  Follow lipid panel.

## 2021-09-27 NOTE — Assessment & Plan Note (Signed)
Physical today 09/27/21.  PAP 09/17/20 - negative with negative HPV.  Mammogram 11/01/20 - Birads I.  Colonoscopy 08/2017 - recommended f/u in 5 years.

## 2021-09-27 NOTE — Assessment & Plan Note (Signed)
Slightly elevated. Remainder of liver panel wnl.  Previous ultrasound ok. Follow.

## 2021-09-27 NOTE — Progress Notes (Signed)
[name], female   DOB: [birth year], 60 y.o.   MRN: [id number]   Subjective:    [name], female    DOB: 05/29/1961, 60 y.o.   MRN: [id number]  This visit occurred during the SARS-CoV-2 public health emergency.  Safety protocols were in place, including screening questions prior to the visit, additional usage of staff PPE, and extensive cleaning of exam room while observing appropriate contact time as indicated for disinfecting solutions.   Patient here for physical .   HPI She was recently diagnosed with covid.  Symptoms started two weeks ago with GI symptoms.  Better now.  Never had any sob, chest congestion or increased cough.  Tries to stay active.  No chest pain or sob reported.  No abdominal pain or bowel change now. Appetite back to normal  bowels back to normal.  Seeing ortho for her shoulder.  S/p two PRP injections.  Improved.     Past Medical History:  Diagnosis Date   Complication of anesthesia    pt states she is very sensitive to most drugs, usually needs 1/2 of whatever other people get. She states as a child/teenager she had difficulty waking up after surgery due to the sensitivity that she has to drugs.   Eczema    Frequent UTI    H/O lymphadenopathy    With previous submandibular node removals.   Hypertension    Recurrent sinus infections    Sleep apnea    uses c-pap   Past Surgical History:  Procedure Laterality Date   COLONOSCOPY WITH PROPOFOL N/A 08/25/2017   Procedure: COLONOSCOPY WITH PROPOFOL;  Surgeon: Lucilla Lame, MD;  Location: [facility] ENDOSCOPY;  Service: Endoscopy;  Laterality: N/A;   Lymph Node Removal  1984   NECK SURGERY  1977   H/O Right neck surgery secondary to a benign growth with when patient was 48 or 60 yo   skin lesion extraction  2013   basal cell - Dr Phillip Heal   Uterine Polyps Removed  2004   Family History  Problem Relation Age of Onset   Hyperlipidemia Mother    Thyroid disease Mother    Heart  disease Mother        Heart Valve problems   Coronary artery disease Father        Stents placed   Hypertension Other        uncle   Parkinson's disease Paternal Uncle    Parkinson's disease Maternal Grandmother    Heart disease Maternal Grandfather    Heart disease Paternal Grandfather        MI   Breast cancer Neg Hx    Social History   Socioeconomic History   Marital status: Divorced    Spouse name: Not on file   Number of children: 2   Years of education: Not on file   Highest education level: Not on file  Occupational History    Employer: [employer]  Tobacco Use   Smoking status: Never   Smokeless tobacco: Never  Vaping Use   Vaping Use: Never used  Substance and Sexual Activity   Alcohol use: Yes    Alcohol/week: 0.0 standard drinks    Comment: Occasional   Drug use: No   Sexual activity: Not on file  Other Topics Concern   Not on file  Social History Narrative   Not on file   Social Determinants of Health   Financial Resource Strain: Not on file  Food Insecurity: Not on file  Transportation Needs: Not on file  Physical Activity: Not on file  Stress: Not on file  Social Connections: Not on file     Review of Systems  Constitutional:  Negative for appetite change and unexpected weight change.  HENT:  Negative for congestion, sinus pressure and sore throat.   Eyes:  Negative for pain and visual disturbance.  Respiratory:  Negative for cough, chest tightness and shortness of breath.   Cardiovascular:  Negative for chest pain, palpitations and leg swelling.  Gastrointestinal:  Negative for abdominal pain, diarrhea, nausea and vomiting.  Genitourinary:  Negative for difficulty urinating and dysuria.  Musculoskeletal:  Negative for back pain and joint swelling.  Skin:  Negative for color change and rash.  Neurological:  Negative for dizziness, light-headedness and headaches.  Hematological:  Negative for adenopathy. Does not bruise/bleed easily.   Psychiatric/Behavioral:  Negative for agitation and dysphoric mood.       Objective:     BP 120/70    Pulse 90    Temp 97.8 F (36.6 C)    Resp 16    Ht 5\' 4"  (1.626 m)    Wt 119 lb (54 kg)    LMP 12/01/2008    SpO2 98%    BMI 20.43 kg/m  Wt Readings from Last 3 Encounters:  09/27/21 119 lb (54 kg)  05/28/21 119 lb 9.6 oz (54.3 kg)  01/17/21 119 lb 6.4 oz (54.2 kg)    Physical Exam Vitals reviewed.  Constitutional:      General: She is not in acute distress.    Appearance: Normal appearance. She is well-developed.  HENT:     Head: Normocephalic and atraumatic.     Right Ear: External ear normal.     Left Ear: External ear normal.  Eyes:     General: No scleral icterus.       Right eye: No discharge.        Left eye: No discharge.     Conjunctiva/sclera: Conjunctivae normal.  Neck:     Thyroid: No thyromegaly.  Cardiovascular:     Rate and Rhythm: Normal rate and regular rhythm.  Pulmonary:     Effort: No tachypnea, accessory muscle usage or respiratory distress.     Breath sounds: Normal breath sounds. No decreased breath sounds or wheezing.  Chest:  Breasts:    Right: No inverted nipple, mass, nipple discharge or tenderness (no axillary adenopathy).     Left: No inverted nipple, mass, nipple discharge or tenderness (no axilarry adenopathy).  Abdominal:     General: Bowel sounds are normal.     Palpations: Abdomen is soft.     Tenderness: There is no abdominal tenderness.     Comments: Abdominal bruit.   Musculoskeletal:        General: No swelling or tenderness.     Cervical back: Neck supple.  Lymphadenopathy:     Cervical: No cervical adenopathy.  Skin:    Findings: No erythema or rash.  Neurological:     Mental Status: She is alert and oriented to person, place, and time.  Psychiatric:        Mood and Affect: Mood normal.        Behavior: Behavior normal.     Outpatient Encounter Medications as of 09/27/2021  Medication Sig   Calcium  Carb-Cholecalciferol (CALCIUM-VITAMIN D) 600-400 MG-UNIT TABS Take 1 tablet by mouth 3 (three) times a week.   Cholecalciferol 25 MCG (1000 UT) capsule Take by mouth.   estradiol (ESTRACE VAGINAL) 0.1  MG/GM vaginal cream One application q hs for 5 nights and then 2 x/week (Patient taking differently: Place 1 Applicatorful vaginally once a week.)   fluocinonide (LIDEX) 0.05 % external solution APPLY TO AFFECTED AREA(S) AT BEDTIME (Patient taking differently: Apply 1 application topically See admin instructions. Use once or twice a week)   Glucosamine-Chondroit-Vit C-Mn (GLUCOSAMINE-CHONDROITIN) CAPS Take 1 capsule by mouth daily.   ibuprofen (ADVIL) 200 MG tablet Take 200 mg by mouth every 4 (four) hours as needed for mild pain or moderate pain.    meloxicam (MOBIC) 7.5 MG tablet May take one to two tablets daily as needed for pain. Take with food.   Multiple Vitamin (MULTIVITAMIN) tablet Take 1 tablet by mouth daily. Centrum Silver 50+   Omega-3 Fatty Acids (FISH OIL) 1000 MG CAPS Take 1 capsule by mouth daily.   telmisartan (MICARDIS) 20 MG tablet Take 1 tablet by mouth daily.   tretinoin (RETIN-A) 0.05 % cream Apply 1 application topically every 30 (thirty) days. Apply to affected area as needed for Acne   TURMERIC PO Take 1 capsule by mouth daily.   [DISCONTINUED] ondansetron (ZOFRAN) 4 MG tablet Take 1 tablet (4 mg total) by mouth every 8 (eight) hours as needed for nausea or vomiting.   No facility-administered encounter medications on file as of 09/27/2021.     Lab Results  Component Value Date   WBC 4.1 07/29/2021   HGB 13.0 07/29/2021   HCT 39.1 07/29/2021   PLT 298.0 07/29/2021   GLUCOSE 89 07/29/2021   CHOL 202 (H) 07/29/2021   TRIG 86.0 07/29/2021   HDL 62.80 07/29/2021   LDLDIRECT 148.9 08/15/2013   LDLCALC 122 (H) 07/29/2021   ALT 19 07/29/2021   AST 17 07/29/2021   NA 141 07/29/2021   K 4.7 07/29/2021   CL 103 07/29/2021   CREATININE 0.83 07/29/2021   BUN 17  07/29/2021   CO2 31 07/29/2021   TSH 1.63 07/29/2021       Assessment & Plan:   Problem List Items Addressed This Visit     Abdominal bruit    Previous ultrasound - no abnormality of aorta.  Discussed today.  Elects to follow.       Essential hypertension, benign    Continue micardis.  Blood pressure doing well.  Follow pressure.  Follow metabolic panel.       Health care maintenance    Physical today 09/27/21.  PAP 09/17/20 - negative with negative HPV.  Mammogram 11/01/20 - Birads I.  Colonoscopy 08/2017 - recommended f/u in 5 years.        Hyperbilirubinemia    Slightly elevated. Remainder of liver panel wnl.  Previous ultrasound ok. Follow.       Hypercholesterolemia    The 10-year ASCVD risk score (Arnett DK, et al., 2019) is: 3.5%   Values used to calculate the score:     Age: 7 years     Sex: Female     Is Non-Hispanic African American: No     Diabetic: No     Tobacco smoker: No     Systolic Blood Pressure: 299 mmHg     Is BP treated: Yes     HDL Cholesterol: 62.8 mg/dL     Total Cholesterol: 202 mg/dL  Low cholesterol diet and exercise.  Follow lipid panel.       Left shoulder pain    Seeing Dr Roland Rack        Einar Pheasant, MD

## 2021-09-27 NOTE — Assessment & Plan Note (Signed)
Continue micardis.  Blood pressure doing well.  Follow pressure.  Follow metabolic panel.

## 2021-09-27 NOTE — Assessment & Plan Note (Addendum)
Previous ultrasound - no abnormality of aorta.  Discussed today.  Elects to follow.

## 2021-09-27 NOTE — Addendum Note (Signed)
Addended by: Lars Masson on: 09/27/2021 08:22 AM   Modules accepted: Orders

## 2021-10-02 DIAGNOSIS — M25512 Pain in left shoulder: Secondary | ICD-10-CM | POA: Diagnosis not present

## 2021-10-17 ENCOUNTER — Other Ambulatory Visit: Payer: Self-pay | Admitting: Internal Medicine

## 2021-10-17 ENCOUNTER — Other Ambulatory Visit: Payer: Self-pay

## 2021-10-17 MED ORDER — TELMISARTAN 20 MG PO TABS
ORAL_TABLET | Freq: Every day | ORAL | 1 refills | Status: DC
Start: 2021-10-17 — End: 2022-03-28
  Filled 2021-10-17: qty 90, 90d supply, fill #0
  Filled 2022-01-08: qty 90, 90d supply, fill #1

## 2021-10-18 ENCOUNTER — Other Ambulatory Visit: Payer: Self-pay

## 2021-10-18 MED ORDER — CARESTART COVID-19 HOME TEST VI KIT
PACK | 0 refills | Status: DC
  Filled 2021-10-18: qty 2, 4d supply, fill #0

## 2021-10-24 DIAGNOSIS — M19012 Primary osteoarthritis, left shoulder: Secondary | ICD-10-CM | POA: Diagnosis not present

## 2021-12-04 IMAGING — CR DG LUMBAR SPINE 2-3V
2 series · 2 of 2 positions shown · non-contrast
Comparison: December 20, 2019

CLINICAL DATA: Localization images for lumbar spine decompression.

EXAM:
LUMBAR SPINE - 2-3 VIEW

[lateral (1 of 2)]
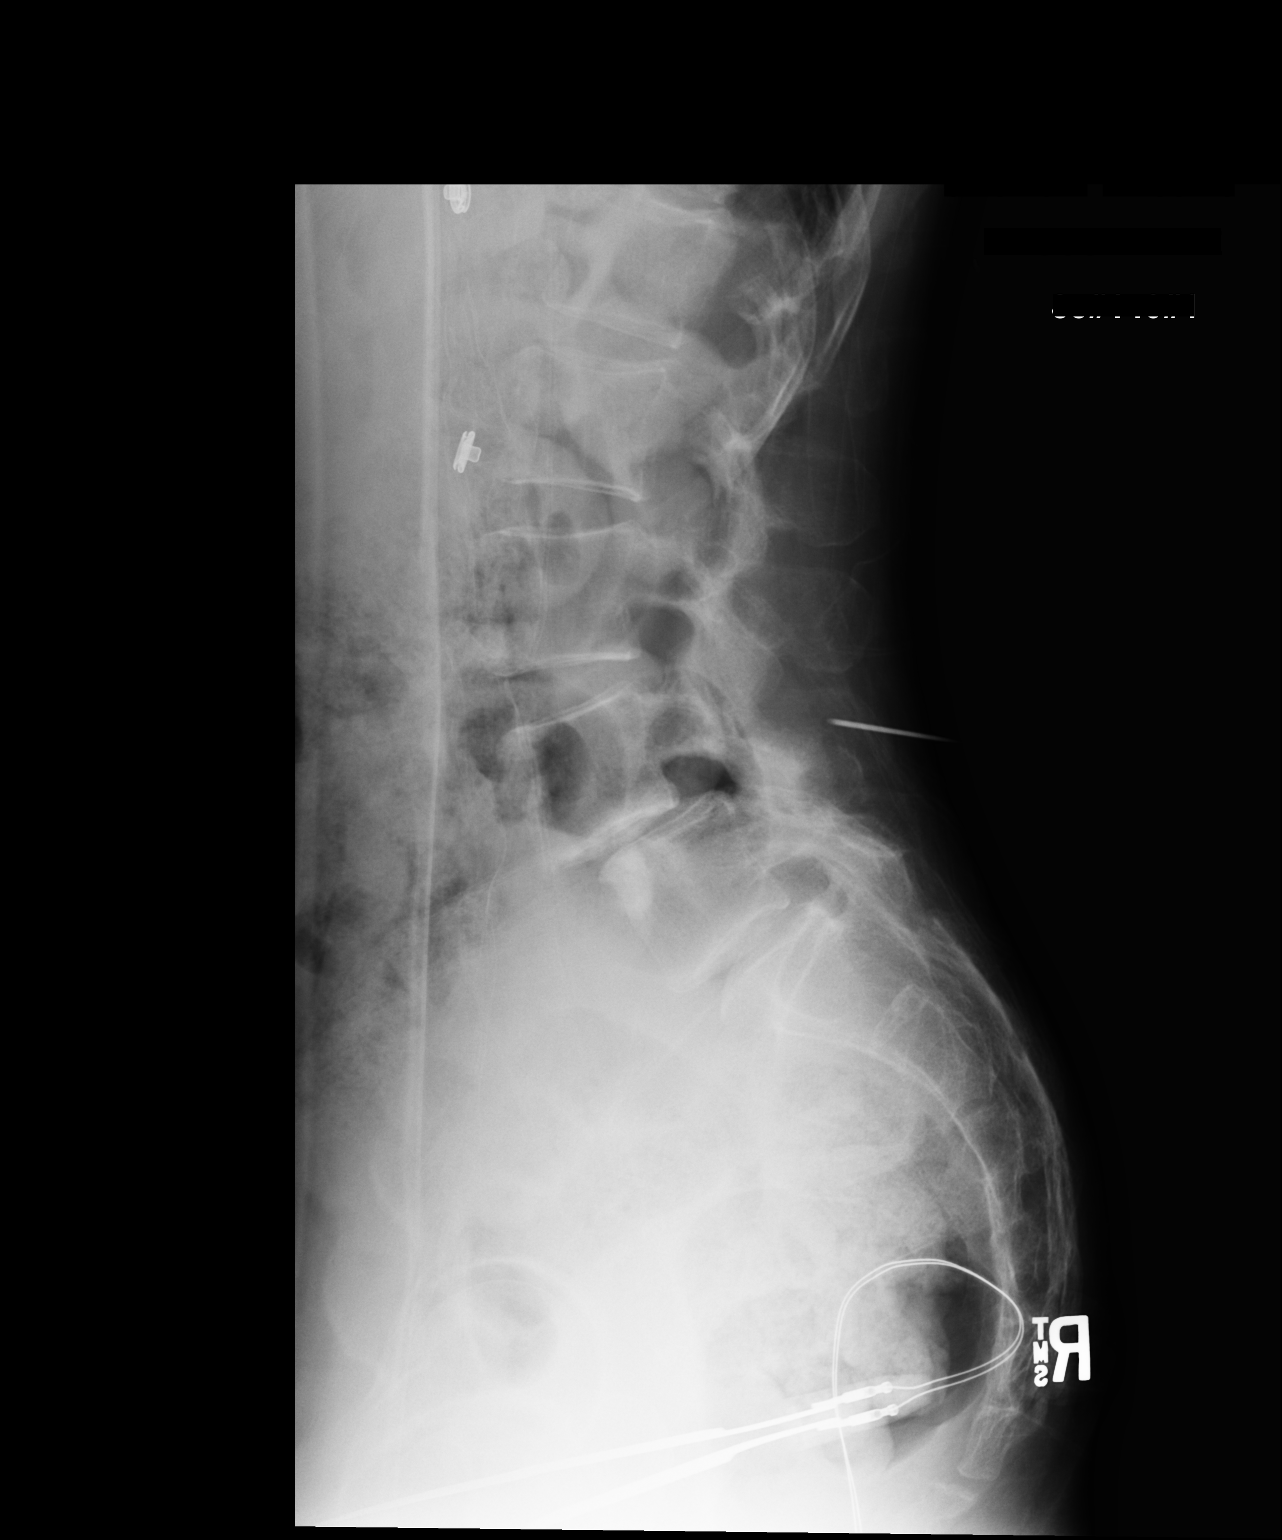

[lateral (2 of 2)]
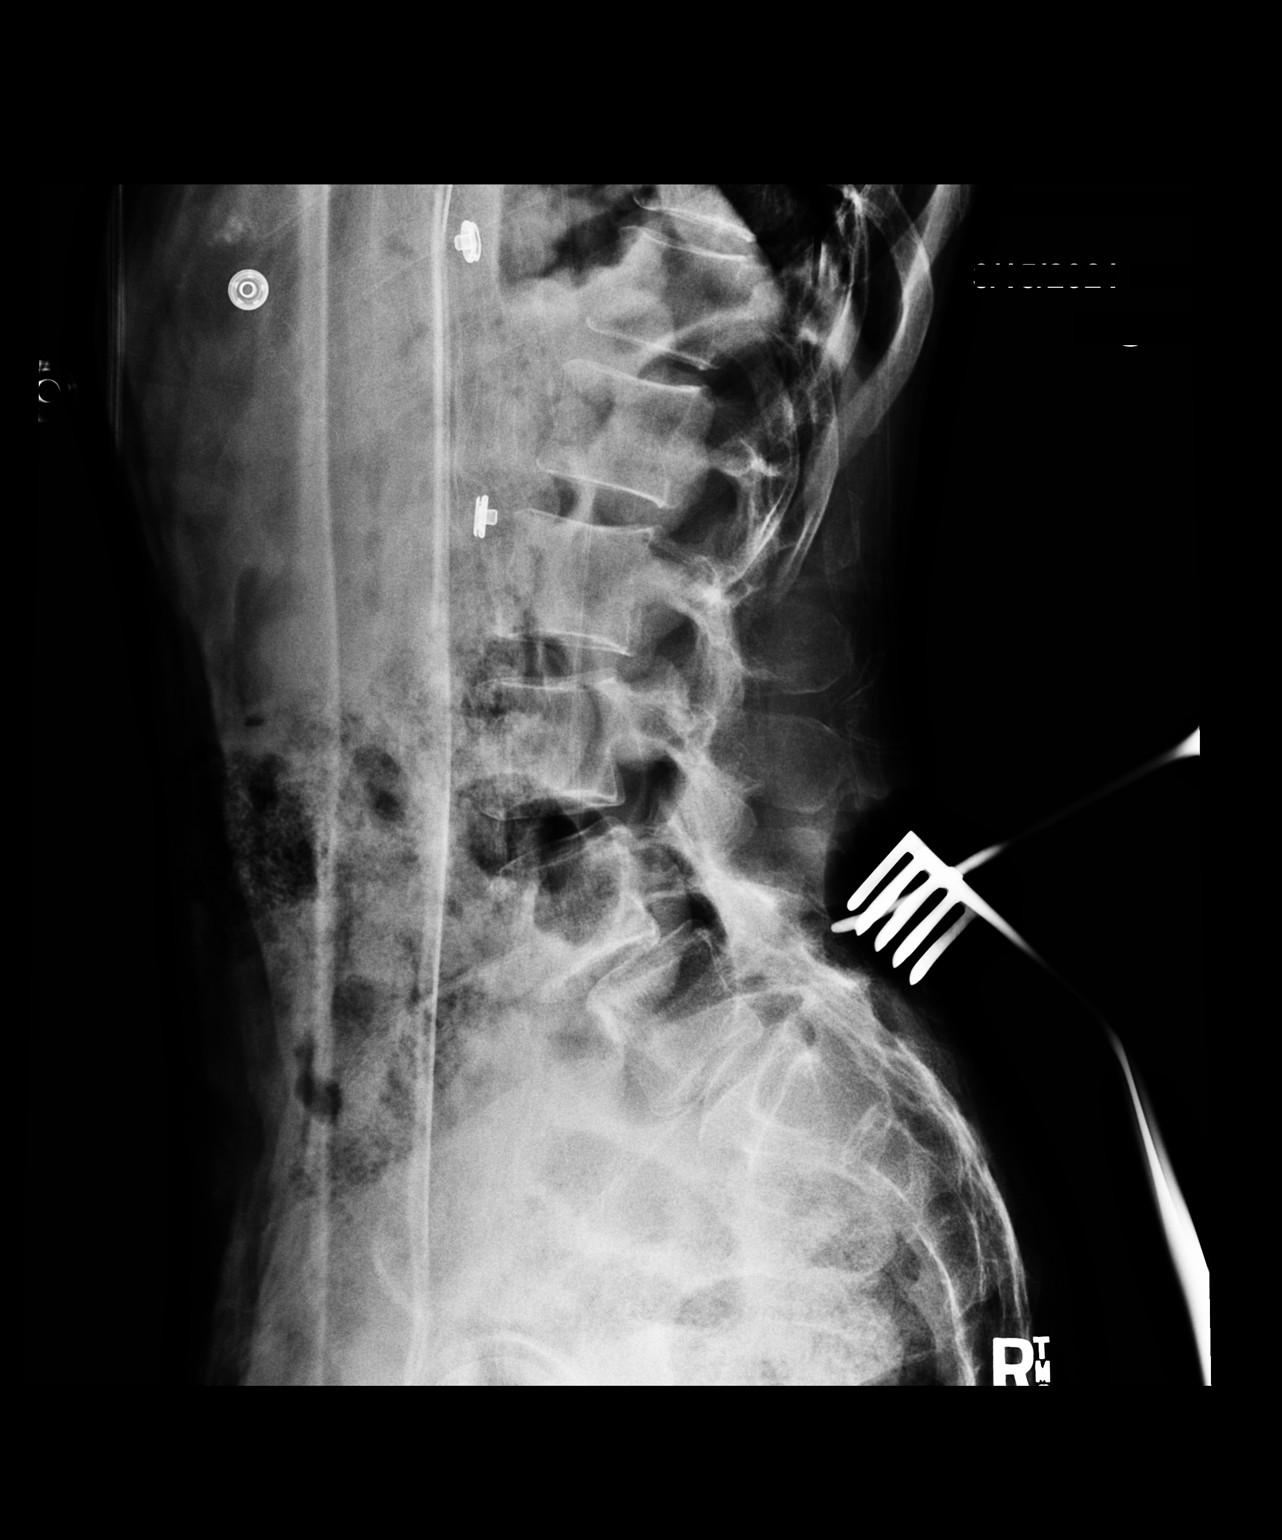

[2 of 2 positions shown; findings below may reference images not displayed]

FINDINGS: There is no evidence of lumbar spine fracture. There is
approximately 1.0 cm anterolisthesis of the L4 on L5 vertebral body.
Mild endplate sclerosis and marked severity intervertebral disc
space narrowing is also seen at this level. A radiopaque needle is
seen overlying the posterior elements at this level.
IMPRESSION: Anterolisthesis of L4 on L5 vertebral body with marked degenerative
disc disease at this level.

## 2021-12-09 ENCOUNTER — Ambulatory Visit
Admission: RE | Admit: 2021-12-09 | Discharge: 2021-12-09 | Disposition: A | Discharge status: Home / Self Care | Payer: 59 | Source: Ambulatory Visit | Attending: Internal Medicine | Admitting: Internal Medicine

## 2021-12-09 ENCOUNTER — Other Ambulatory Visit: Payer: Self-pay

## 2021-12-09 DIAGNOSIS — Z1231 Encounter for screening mammogram for malignant neoplasm of breast: Secondary | ICD-10-CM | POA: Diagnosis not present

## 2021-12-11 DIAGNOSIS — M25511 Pain in right shoulder: Secondary | ICD-10-CM | POA: Diagnosis not present

## 2022-01-08 ENCOUNTER — Other Ambulatory Visit: Payer: Self-pay

## 2022-03-19 ENCOUNTER — Other Ambulatory Visit: Payer: Self-pay

## 2022-03-19 DIAGNOSIS — Z86010 Personal history of colonic polyps: Secondary | ICD-10-CM

## 2022-03-19 MED ORDER — NA SULFATE-K SULFATE-MG SULF 17.5-3.13-1.6 GM/177ML PO SOLN
1.0000 | Freq: Once | ORAL | 0 refills | Status: AC
  Filled 2022-03-19: qty 354, 1d supply, fill #0

## 2022-03-19 NOTE — Progress Notes (Signed)
Gastroenterology Pre-Procedure Review  Request Date: 04/18/2022 Requesting Physician: Dr. Allen Norris  PATIENT REVIEW QUESTIONS: The patient responded to the following health history questions as indicated:    1. Are you having any GI issues? no 2. Do you have a personal history of Polyps? yes (last colonoscopy) 3. Do you have a family history of Colon Cancer or Polyps? no 4. Diabetes Mellitus? no 5. Joint replacements in the past 12 months?no 6. Major health problems in the past 3 months?no 7. Any artificial heart valves, MVP, or defibrillator?no    MEDICATIONS & ALLERGIES:    Patient reports the following regarding taking any anticoagulation/antiplatelet therapy:   Plavix, Coumadin, Eliquis, Xarelto, Lovenox, Pradaxa, Brilinta, or Effient? no Aspirin? no  Patient confirms/reports the following medications:  Current Outpatient Medications  Medication Sig Dispense Refill   Calcium Carb-Cholecalciferol (CALCIUM-VITAMIN D) 600-400 MG-UNIT TABS Take 1 tablet by mouth 3 (three) times a week.     Cholecalciferol 25 MCG (1000 UT) capsule Take by mouth.     COVID-19 At Home Antigen Test (CARESTART COVID-19 HOME TEST) KIT Use as directed 2 kit 0   estradiol (ESTRACE VAGINAL) 0.1 MG/GM vaginal cream One application q hs for 5 nights and then 2 x/week (Patient taking differently: Place 1 Applicatorful vaginally once a week.) 42.5 g 1   fluocinonide (LIDEX) 0.05 % external solution APPLY TO AFFECTED AREA(S) AT BEDTIME (Patient taking differently: Apply 1 application topically See admin instructions. Use once or twice a week) 60 mL 0   Glucosamine-Chondroit-Vit C-Mn (GLUCOSAMINE-CHONDROITIN) CAPS Take 1 capsule by mouth daily.     ibuprofen (ADVIL) 200 MG tablet Take 200 mg by mouth every 4 (four) hours as needed for mild pain or moderate pain.      meloxicam (MOBIC) 7.5 MG tablet May take one to two tablets daily as needed for pain. Take with food. 30 tablet 1   Multiple Vitamin (MULTIVITAMIN) tablet  Take 1 tablet by mouth daily. Centrum Silver 50+     Omega-3 Fatty Acids (FISH OIL) 1000 MG CAPS Take 1 capsule by mouth daily.     telmisartan (MICARDIS) 20 MG tablet Take 1 tablet by mouth daily. 90 tablet 1   tretinoin (RETIN-A) 0.05 % cream Apply 1 application topically every 30 (thirty) days. Apply to affected area as needed for Acne     TURMERIC PO Take 1 capsule by mouth daily.     No current facility-administered medications for this visit.    Patient confirms/reports the following allergies:  Allergies  Allergen Reactions   Diclofenac Sodium Other (See Comments)    Affected her liver function, so she can't take this.   Voltaren [Diclofenac Sodium]     Liver problem    No orders of the defined types were placed in this encounter.   AUTHORIZATION INFORMATION Primary Insurance: 1D#: Group #:  Secondary Insurance: 1D#: Group #:  SCHEDULE INFORMATION: Date: 04/18/2022 Time: Location: [facility]

## 2022-03-20 ENCOUNTER — Telehealth: Payer: Self-pay | Admitting: Internal Medicine

## 2022-03-20 NOTE — Telephone Encounter (Signed)
Pt called wanting to know if there were other options that she can take instead of getting a colonoscopy done

## 2022-03-20 NOTE — Telephone Encounter (Signed)
LM for pt to cb re: NEEDS FOLLOW UP W/ DR Nicki Reaper, DUE NOW Also cologuard is an option, but Dr Nicki Reaper prefers colonoscopy. Can discuss at follow up.

## 2022-03-28 ENCOUNTER — Encounter: Payer: Self-pay | Admitting: Internal Medicine

## 2022-03-28 ENCOUNTER — Ambulatory Visit: Payer: 59 | Admitting: Internal Medicine

## 2022-03-28 ENCOUNTER — Other Ambulatory Visit: Payer: Self-pay

## 2022-03-28 DIAGNOSIS — M25512 Pain in left shoulder: Secondary | ICD-10-CM | POA: Diagnosis not present

## 2022-03-28 DIAGNOSIS — E7800 Pure hypercholesterolemia, unspecified: Secondary | ICD-10-CM

## 2022-03-28 DIAGNOSIS — R5383 Other fatigue: Secondary | ICD-10-CM

## 2022-03-28 DIAGNOSIS — M1990 Unspecified osteoarthritis, unspecified site: Secondary | ICD-10-CM | POA: Diagnosis not present

## 2022-03-28 DIAGNOSIS — G4730 Sleep apnea, unspecified: Secondary | ICD-10-CM | POA: Diagnosis not present

## 2022-03-28 DIAGNOSIS — I10 Essential (primary) hypertension: Secondary | ICD-10-CM

## 2022-03-28 DIAGNOSIS — Z1211 Encounter for screening for malignant neoplasm of colon: Secondary | ICD-10-CM

## 2022-03-28 MED ORDER — TELMISARTAN 20 MG PO TABS
ORAL_TABLET | Freq: Every day | ORAL | 1 refills | Status: DC
  Filled 2022-03-28: qty 90, 90d supply, fill #0
  Filled 2022-07-13: qty 30, 30d supply, fill #1
  Filled 2022-07-30 – 2022-08-06 (×3): qty 30, 30d supply, fill #2
  Filled 2022-08-06: qty 60, 60d supply, fill #2

## 2022-03-28 MED ORDER — CLOBETASOL PROPIONATE 0.05 % EX SOLN
1.0000 | Freq: Every day | CUTANEOUS | 0 refills | Status: DC | PRN
  Filled 2022-03-28: qty 50, 30d supply, fill #0

## 2022-03-28 NOTE — Progress Notes (Unsigned)
[name], female   DOB: 1961-04-27, 61 y.o.   MRN: [id number]   Subjective:    [name], female    DOB: December 10, 1960, 61 y.o.   MRN: [id number]    Patient here for a scheduled follow up.   Chief Complaint  Patient presents with   Hypertension   Hyperlipidemia   .   HPI Persistent issues with left shoulder.  Seeing ortho.  Has tried injections, etc. Persistent issues.  Having to help her 49 year old dog - aggravates.  Hands stiff/pain. Ortho has discussed surgery.  Discussed wit her today.  Tries to stay active.  No chest pain or sob reported.  No abdominal pain or bowel issues reported. She had questions about her thyroid.  Request to have Free T3 and Free T4 checked along with tsh.  Blood pressure doing well.     Past Medical History:  Diagnosis Date   Complication of anesthesia    pt states she is very sensitive to most drugs, usually needs 1/2 of whatever other people get. She states as a child/teenager she had difficulty waking up after surgery due to the sensitivity that she has to drugs.   Eczema    Frequent UTI    H/O lymphadenopathy    With previous submandibular node removals.   Hypertension    Recurrent sinus infections    Sleep apnea    uses c-pap   Past Surgical History:  Procedure Laterality Date   COLONOSCOPY WITH PROPOFOL N/A 08/25/2017   Procedure: COLONOSCOPY WITH PROPOFOL;  Surgeon: Lucilla Lame, MD;  Location: [facility] ENDOSCOPY;  Service: Endoscopy;  Laterality: N/A;   Lymph Node Removal  1984   NECK SURGERY  1977   H/O Right neck surgery secondary to a benign growth with when patient was 51 or 61 yo   skin lesion extraction  2013   basal cell - Dr Phillip Heal   Uterine Polyps Removed  2004   Family History  Problem Relation Age of Onset   Hyperlipidemia Mother    Thyroid disease Mother    Heart disease Mother        Heart Valve problems   Coronary artery disease Father        Stents placed   Hypertension Other         uncle   Parkinson's disease Paternal Uncle    Parkinson's disease Maternal Grandmother    Heart disease Maternal Grandfather    Heart disease Paternal Grandfather        MI   Breast cancer Neg Hx    Social History   Socioeconomic History   Marital status: Divorced    Spouse name: Not on file   Number of children: 2   Years of education: Not on file   Highest education level: Not on file  Occupational History    Employer: [employer]  Tobacco Use   Smoking status: Never   Smokeless tobacco: Never  Vaping Use   Vaping Use: Never used  Substance and Sexual Activity   Alcohol use: Yes    Alcohol/week: 0.0 standard drinks of alcohol    Comment: Occasional   Drug use: No   Sexual activity: Not on file  Other Topics Concern   Not on file  Social History Narrative   Not on file   Social Determinants of Health   Financial Resource Strain: Not on file  Food Insecurity: Not on file  Transportation Needs: Not on file  Physical Activity: Not  on file  Stress: Not on file  Social Connections: Not on file     Review of Systems  Constitutional:  Negative for appetite change and unexpected weight change.  HENT:  Negative for congestion and sinus pressure.   Respiratory:  Negative for cough, chest tightness and shortness of breath.   Cardiovascular:  Negative for chest pain, palpitations and leg swelling.  Gastrointestinal:  Negative for abdominal pain, diarrhea, nausea and vomiting.  Genitourinary:  Negative for difficulty urinating and dysuria.  Musculoskeletal:  Negative for myalgias.       Hand pain/stiffness as outlined.  Left shoulder pain as outlined.   Skin:  Negative for color change and rash.  Neurological:  Negative for dizziness, light-headedness and headaches.  Psychiatric/Behavioral:  Negative for agitation and dysphoric mood.        Objective:     BP 120/80 (BP Location: Left Arm, Patient Position: Sitting, Cuff Size: Small)   Pulse 98   Temp 98.4 F (36.9  C) (Temporal)   Resp 18   Ht 5' 4" (1.626 m)   Wt 119 lb (54 kg)   LMP 12/01/2008   SpO2 98%   BMI 20.43 kg/m  Wt Readings from Last 3 Encounters:  03/28/22 119 lb (54 kg)  09/27/21 119 lb (54 kg)  05/28/21 119 lb 9.6 oz (54.3 kg)    Physical Exam Vitals reviewed.  Constitutional:      General: She is not in acute distress.    Appearance: Normal appearance.  HENT:     Head: Normocephalic and atraumatic.     Right Ear: External ear normal.     Left Ear: External ear normal.  Eyes:     General: No scleral icterus.       Right eye: No discharge.        Left eye: No discharge.     Conjunctiva/sclera: Conjunctivae normal.  Neck:     Thyroid: No thyromegaly.  Cardiovascular:     Rate and Rhythm: Normal rate and regular rhythm.  Pulmonary:     Effort: No respiratory distress.     Breath sounds: Normal breath sounds. No wheezing.  Abdominal:     General: Bowel sounds are normal.     Palpations: Abdomen is soft.     Tenderness: There is no abdominal tenderness.  Musculoskeletal:        General: No swelling or tenderness.     Cervical back: Neck supple. No tenderness.  Lymphadenopathy:     Cervical: No cervical adenopathy.  Skin:    Findings: No erythema or rash.  Neurological:     Mental Status: She is alert.  Psychiatric:        Mood and Affect: Mood normal.        Behavior: Behavior normal.      Outpatient Encounter Medications as of 03/28/2022  Medication Sig   Calcium Carb-Cholecalciferol (CALCIUM-VITAMIN D) 600-400 MG-UNIT TABS Take 1 tablet by mouth 3 (three) times a week.   Cholecalciferol 25 MCG (1000 UT) capsule Take by mouth.   clobetasol (TEMOVATE) 0.05 % external solution Apply 1 Application topically daily as needed.   estradiol (ESTRACE VAGINAL) 0.1 MG/GM vaginal cream One application q hs for 5 nights and then 2 x/week (Patient taking differently: Place 1 Applicatorful vaginally once a week.)   fluocinonide (LIDEX) 0.05 % external solution APPLY TO  AFFECTED AREA(S) AT BEDTIME (Patient taking differently: Apply 1 application  topically See admin instructions. Use once or twice a week)   Glucosamine-Chondroit-Vit C-Mn (  GLUCOSAMINE-CHONDROITIN) CAPS Take 1 capsule by mouth daily.   ibuprofen (ADVIL) 200 MG tablet Take 200 mg by mouth every 4 (four) hours as needed for mild pain or moderate pain.    meloxicam (MOBIC) 7.5 MG tablet May take one to two tablets daily as needed for pain. Take with food.   Multiple Vitamin (MULTIVITAMIN) tablet Take 1 tablet by mouth daily. Centrum Silver 50+   Omega-3 Fatty Acids (FISH OIL) 1000 MG CAPS Take 1 capsule by mouth daily.   tretinoin (RETIN-A) 0.05 % cream Apply 1 application topically every 30 (thirty) days. Apply to affected area as needed for Acne   TURMERIC PO Take 1 capsule by mouth daily.   [DISCONTINUED] COVID-19 At Home Antigen Test (CARESTART COVID-19 HOME TEST) KIT Use as directed   [DISCONTINUED] telmisartan (MICARDIS) 20 MG tablet Take 1 tablet by mouth daily.   telmisartan (MICARDIS) 20 MG tablet Take 1 tablet by mouth daily.   No facility-administered encounter medications on file as of 03/28/2022.     Lab Results  Component Value Date   WBC 4.1 07/29/2021   HGB 13.0 07/29/2021   HCT 39.1 07/29/2021   PLT 298.0 07/29/2021   GLUCOSE 89 07/29/2021   CHOL 202 (H) 07/29/2021   TRIG 86.0 07/29/2021   HDL 62.80 07/29/2021   LDLDIRECT 148.9 08/15/2013   LDLCALC 122 (H) 07/29/2021   ALT 19 07/29/2021   AST 17 07/29/2021   NA 141 07/29/2021   K 4.7 07/29/2021   CL 103 07/29/2021   CREATININE 0.83 07/29/2021   BUN 17 07/29/2021   CO2 31 07/29/2021   TSH 1.63 07/29/2021    MM 3D SCREEN BREAST BILATERAL  Result Date: 12/09/2021 CLINICAL DATA:  Screening. EXAM: DIGITAL SCREENING BILATERAL MAMMOGRAM WITH TOMOSYNTHESIS AND CAD TECHNIQUE: Bilateral screening digital craniocaudal and mediolateral oblique mammograms were obtained. Bilateral screening digital breast tomosynthesis was  performed. The images were evaluated with computer-aided detection. COMPARISON:  Previous exam(s). ACR Breast Density Category c: The breast tissue is heterogeneously dense, which may obscure small masses. FINDINGS: There are no findings suspicious for malignancy. IMPRESSION: No mammographic evidence of malignancy. A result letter of this screening mammogram will be mailed directly to the patient. RECOMMENDATION: Screening mammogram in one year. (Code:SM-B-01Y) BI-RADS CATEGORY  1: Negative. Electronically Signed   By: Lovey Newcomer M.D.   On: 12/09/2021 16:23       Assessment & Plan:   Problem List Items Addressed This Visit     Colon cancer screening    Discussed today.  She had questions about cologuard.  History of polyps.  Scheduled for colonoscopy.       Essential hypertension, benign    Continue micardis.  Blood pressure doing well.  Follow pressure.  Follow metabolic panel.       Relevant Medications   telmisartan (MICARDIS) 20 MG tablet   Other Relevant Orders   Basic metabolic panel   Fatigue    Some fatigue.  Had questions about thyroid.  Request to have tsh, free t3 and free t4 checked with next labs.        Relevant Orders   TSH   T4, free   T3, free   Hypercholesterolemia    The 10-year ASCVD risk score (Arnett DK, et al., 2019) is: 3.6%   Values used to calculate the score:     Age: 53 years     Sex: Female     Is Non-Hispanic African American: No     Diabetic: No  Tobacco smoker: No     Systolic Blood Pressure: 258 mmHg     Is BP treated: Yes     HDL Cholesterol: 62.8 mg/dL     Total Cholesterol: 202 mg/dL  Low cholesterol diet and exercise.  Follow lipid panel.       Relevant Medications   telmisartan (MICARDIS) 20 MG tablet   Other Relevant Orders   TSH   Hepatic function panel   Lipid panel   Left shoulder pain    Seeing ortho as outlined.  Had questions about possible surgery today. Discussed.       Osteoarthritis    Has seen rheumatology.   Diagnosed with OA. Pain base of thumb. Wears splint. Seeing ortho for shoulder as outlined.  Follow.       Sleep apnea    CPAP.         Einar Pheasant, MD

## 2022-03-30 ENCOUNTER — Encounter: Payer: Self-pay | Admitting: Internal Medicine

## 2022-03-30 NOTE — Assessment & Plan Note (Signed)
Has seen rheumatology.  Diagnosed with OA. Pain base of thumb. Wears splint. Seeing ortho for shoulder as outlined.  Follow.

## 2022-03-30 NOTE — Assessment & Plan Note (Signed)
Some fatigue.  Had questions about thyroid.  Request to have tsh, free t3 and free t4 checked with next labs.

## 2022-03-30 NOTE — Assessment & Plan Note (Signed)
CPAP.  

## 2022-03-30 NOTE — Assessment & Plan Note (Signed)
Discussed today.  She had questions about cologuard.  History of polyps.  Scheduled for colonoscopy.

## 2022-03-30 NOTE — Assessment & Plan Note (Signed)
Continue micardis.  Blood pressure doing well.  Follow pressure.  Follow metabolic panel.

## 2022-03-30 NOTE — Assessment & Plan Note (Addendum)
Seeing ortho as outlined.  Had questions about possible surgery today. Discussed.

## 2022-03-30 NOTE — Assessment & Plan Note (Signed)
The 10-year ASCVD risk score (Arnett DK, et al., 2019) is: 3.6%   Values used to calculate the score:     Age: 61 years     Sex: Female     Is Non-Hispanic African American: No     Diabetic: No     Tobacco smoker: No     Systolic Blood Pressure: 031 mmHg     Is BP treated: Yes     HDL Cholesterol: 62.8 mg/dL     Total Cholesterol: 202 mg/dL  Low cholesterol diet and exercise.  Follow lipid panel.

## 2022-03-31 ENCOUNTER — Other Ambulatory Visit: Payer: Self-pay

## 2022-04-01 ENCOUNTER — Other Ambulatory Visit: Payer: Self-pay

## 2022-04-01 DIAGNOSIS — Z86010 Personal history of colonic polyps: Secondary | ICD-10-CM

## 2022-04-01 NOTE — Progress Notes (Signed)
Patients colonoscopy has been rescheduled to 07/01/22 at [facility] with Dr. Allen Norris.  New instructions provided during her stop by the office to reschedule.

## 2022-04-03 ENCOUNTER — Other Ambulatory Visit (INDEPENDENT_AMBULATORY_CARE_PROVIDER_SITE_OTHER): Payer: 59

## 2022-04-03 DIAGNOSIS — E7800 Pure hypercholesterolemia, unspecified: Secondary | ICD-10-CM

## 2022-04-03 DIAGNOSIS — I10 Essential (primary) hypertension: Secondary | ICD-10-CM | POA: Diagnosis not present

## 2022-04-03 DIAGNOSIS — R5383 Other fatigue: Secondary | ICD-10-CM

## 2022-04-03 LAB — HEPATIC FUNCTION PANEL
ALT: 17 U/L (ref 0–35)
AST: 17 U/L (ref 0–37)
Albumin: 4.5 g/dL (ref 3.5–5.2)
Alkaline Phosphatase: 58 U/L (ref 39–117)
Bilirubin, Direct: 0.2 mg/dL (ref 0.0–0.3)
Total Bilirubin: 1.2 mg/dL (ref 0.2–1.2)
Total Protein: 7.1 g/dL (ref 6.0–8.3)

## 2022-04-03 LAB — BASIC METABOLIC PANEL
BUN: 16 mg/dL (ref 6–23)
CO2: 31 mEq/L (ref 19–32)
Calcium: 9.7 mg/dL (ref 8.4–10.5)
Chloride: 103 mEq/L (ref 96–112)
Creatinine, Ser: 0.79 mg/dL (ref 0.40–1.20)
GFR: 81.18 mL/min (ref >60.00)
Glucose, Bld: 86 mg/dL (ref 70–99)
Potassium: 4 mEq/L (ref 3.5–5.1)
Sodium: 140 mEq/L (ref 135–145)

## 2022-04-03 LAB — TSH: TSH: 2.08 u[IU]/mL (ref 0.35–5.50)

## 2022-04-03 LAB — LIPID PANEL
Cholesterol: 202 mg/dL — ABNORMAL HIGH (ref 0–200)
HDL: 67.2 mg/dL (ref >39.00)
LDL Cholesterol: 117 mg/dL — ABNORMAL HIGH (ref 0–99)
NonHDL: 135.07
Total CHOL/HDL Ratio: 3
Triglycerides: 88 mg/dL (ref 0.0–149.0)
VLDL: 17.6 mg/dL (ref 0.0–40.0)

## 2022-04-03 LAB — T3, FREE: T3, Free: 2.6 pg/mL (ref 2.3–4.2)

## 2022-04-03 LAB — T4, FREE: Free T4: 1.04 ng/dL (ref 0.60–1.60)

## 2022-04-10 ENCOUNTER — Other Ambulatory Visit: Payer: Self-pay

## 2022-04-10 DIAGNOSIS — L218 Other seborrheic dermatitis: Secondary | ICD-10-CM | POA: Diagnosis not present

## 2022-04-10 DIAGNOSIS — D2372 Other benign neoplasm of skin of left lower limb, including hip: Secondary | ICD-10-CM | POA: Diagnosis not present

## 2022-04-10 DIAGNOSIS — L821 Other seborrheic keratosis: Secondary | ICD-10-CM | POA: Diagnosis not present

## 2022-04-10 MED ORDER — CLOBETASOL PROPIONATE 0.05 % EX SOLN
CUTANEOUS | 1 refills | Status: DC
  Filled 2022-04-10: qty 50, 30d supply, fill #0
  Filled 2022-05-12: qty 50, 20d supply, fill #0
  Filled 2022-06-04: qty 50, 20d supply, fill #1

## 2022-04-26 NOTE — Telephone Encounter (Signed)
Error. ng 

## 2022-05-12 ENCOUNTER — Other Ambulatory Visit: Payer: Self-pay

## 2022-06-04 ENCOUNTER — Other Ambulatory Visit: Payer: Self-pay

## 2022-06-09 ENCOUNTER — Encounter: Payer: Self-pay | Admitting: Internal Medicine

## 2022-06-09 DIAGNOSIS — I10 Essential (primary) hypertension: Secondary | ICD-10-CM

## 2022-06-09 DIAGNOSIS — M5442 Lumbago with sciatica, left side: Secondary | ICD-10-CM

## 2022-06-10 NOTE — Telephone Encounter (Signed)
Ok to print rx (DME for blood pressure monitor)

## 2022-06-11 ENCOUNTER — Other Ambulatory Visit: Payer: Self-pay

## 2022-06-11 MED ORDER — BLOOD PRESSURE MONITOR MISC
0 refills | Status: AC
  Filled 2022-06-11: qty 1, 30d supply, fill #0

## 2022-06-11 NOTE — Addendum Note (Signed)
Addended by: Fulton Mole D on: 06/11/2022 08:44 AM   Modules accepted: Orders

## 2022-06-26 ENCOUNTER — Telehealth: Payer: Self-pay

## 2022-06-26 NOTE — Telephone Encounter (Signed)
Patient contacted office to reschedule her colonoscopy with Dr. Allen Norris to early January.  Procedure has been rescheduled to 10/17/22 [place].  Patient has been advised to call office to update her insurance information once she receives her new insurance card.  Thanks,  [place], [place]

## 2022-07-04 ENCOUNTER — Other Ambulatory Visit: Payer: Self-pay

## 2022-07-10 DIAGNOSIS — H52223 Regular astigmatism, bilateral: Secondary | ICD-10-CM | POA: Diagnosis not present

## 2022-07-14 ENCOUNTER — Other Ambulatory Visit: Payer: Self-pay

## 2022-07-22 ENCOUNTER — Other Ambulatory Visit: Payer: Self-pay

## 2022-07-30 ENCOUNTER — Other Ambulatory Visit: Payer: Self-pay

## 2022-07-31 ENCOUNTER — Other Ambulatory Visit: Payer: Self-pay

## 2022-08-04 ENCOUNTER — Other Ambulatory Visit: Payer: Self-pay

## 2022-08-06 ENCOUNTER — Other Ambulatory Visit: Payer: Self-pay

## 2022-09-05 ENCOUNTER — Other Ambulatory Visit: Payer: Self-pay

## 2022-09-09 ENCOUNTER — Telehealth: Payer: Self-pay

## 2022-09-09 NOTE — Telephone Encounter (Signed)
Patient has colonoscopy scheduled with Dr. Allen Norris on 09/16/22.  She would like to know if there is another Colonoscopy prep she can exchange it out for.  She has Suprep bowel prep and states that its "very high in sodium and she has high blood pressure".  Please advise.  Thanks,  [place], [place]

## 2022-09-10 ENCOUNTER — Encounter: Payer: Self-pay | Admitting: Internal Medicine

## 2022-09-10 NOTE — Telephone Encounter (Addendum)
Left voice message for patient advising per Dr. Allen Norris,  all of the colonoscopy preps are high in sodium and have electrolytes or else they would get absorbed and not urinated out. The sodium is not absorbed but she is welcome to do [place] 4 liters.   Thanks,  [place], [place]

## 2022-09-29 ENCOUNTER — Other Ambulatory Visit: Payer: Self-pay | Admitting: Internal Medicine

## 2022-09-29 ENCOUNTER — Other Ambulatory Visit: Payer: Self-pay

## 2022-09-30 ENCOUNTER — Other Ambulatory Visit: Payer: Self-pay

## 2022-09-30 ENCOUNTER — Encounter: Payer: Self-pay | Admitting: Internal Medicine

## 2022-09-30 ENCOUNTER — Ambulatory Visit (INDEPENDENT_AMBULATORY_CARE_PROVIDER_SITE_OTHER): Payer: 59 | Admitting: Internal Medicine

## 2022-09-30 VITALS — BP 140/78 | HR 74 | Temp 97.9°F | Resp 14 | Ht 64.0 in | Wt 117.8 lb

## 2022-09-30 DIAGNOSIS — M25512 Pain in left shoulder: Secondary | ICD-10-CM

## 2022-09-30 DIAGNOSIS — Z1211 Encounter for screening for malignant neoplasm of colon: Secondary | ICD-10-CM

## 2022-09-30 DIAGNOSIS — Z0000 Encounter for general adult medical examination without abnormal findings: Secondary | ICD-10-CM | POA: Diagnosis not present

## 2022-09-30 DIAGNOSIS — I10 Essential (primary) hypertension: Secondary | ICD-10-CM | POA: Diagnosis not present

## 2022-09-30 DIAGNOSIS — E7800 Pure hypercholesterolemia, unspecified: Secondary | ICD-10-CM

## 2022-09-30 LAB — LIPID PANEL
Cholesterol: 213 mg/dL — ABNORMAL HIGH (ref 0–200)
HDL: 71.4 mg/dL (ref >39.00)
LDL Cholesterol: 122 mg/dL — ABNORMAL HIGH (ref 0–99)
NonHDL: 142.04
Total CHOL/HDL Ratio: 3
Triglycerides: 99 mg/dL (ref 0.0–149.0)
VLDL: 19.8 mg/dL (ref 0.0–40.0)

## 2022-09-30 LAB — HEPATIC FUNCTION PANEL
ALT: 17 U/L (ref 0–35)
AST: 14 U/L (ref 0–37)
Albumin: 4.6 g/dL (ref 3.5–5.2)
Alkaline Phosphatase: 64 U/L (ref 39–117)
Bilirubin, Direct: 0.2 mg/dL (ref 0.0–0.3)
Total Bilirubin: 1.4 mg/dL — ABNORMAL HIGH (ref 0.2–1.2)
Total Protein: 7 g/dL (ref 6.0–8.3)

## 2022-09-30 LAB — BASIC METABOLIC PANEL
BUN: 13 mg/dL (ref 6–23)
CO2: 32 mEq/L (ref 19–32)
Calcium: 10 mg/dL (ref 8.4–10.5)
Chloride: 101 mEq/L (ref 96–112)
Creatinine, Ser: 0.68 mg/dL (ref 0.40–1.20)
GFR: 94.2 mL/min (ref >60.00)
Glucose, Bld: 82 mg/dL (ref 70–99)
Potassium: 4.2 mEq/L (ref 3.5–5.1)
Sodium: 138 mEq/L (ref 135–145)

## 2022-09-30 LAB — CBC WITH DIFFERENTIAL/PLATELET
Basophils Absolute: 0 10*3/uL (ref 0.0–0.1)
Basophils Relative: 0.3 % (ref 0.0–3.0)
Eosinophils Absolute: 0.1 10*3/uL (ref 0.0–0.7)
Eosinophils Relative: 1.2 % (ref 0.0–5.0)
HCT: 38.4 % (ref 36.0–46.0)
Hemoglobin: 13 g/dL (ref 12.0–15.0)
Lymphocytes Relative: 32.2 % (ref 12.0–46.0)
Lymphs Abs: 1.5 10*3/uL (ref 0.7–4.0)
MCHC: 33.8 g/dL (ref 30.0–36.0)
MCV: 97.2 fl (ref 78.0–100.0)
Monocytes Absolute: 0.4 10*3/uL (ref 0.1–1.0)
Monocytes Relative: 8.2 % (ref 3.0–12.0)
Neutro Abs: 2.7 10*3/uL (ref 1.4–7.7)
Neutrophils Relative %: 58.1 % (ref 43.0–77.0)
Platelets: 345 10*3/uL (ref 150.0–400.0)
RBC: 3.95 Mil/uL (ref 3.87–5.11)
RDW: 13.2 % (ref 11.5–15.5)
WBC: 4.7 10*3/uL (ref 4.0–10.5)

## 2022-09-30 MED ORDER — TELMISARTAN 40 MG PO TABS
40.0000 mg | ORAL_TABLET | Freq: Every day | ORAL | 3 refills | Status: DC
  Filled 2022-09-30: qty 30, 30d supply, fill #0
  Filled 2022-10-26: qty 30, 30d supply, fill #1
  Filled 2022-11-20: qty 30, 30d supply, fill #2
  Filled 2022-12-15: qty 30, 30d supply, fill #3

## 2022-09-30 NOTE — Assessment & Plan Note (Addendum)
Was initially scheduled for CPE today.  Changed to f/u.  PAP 09/17/20 - negative with negative HPV.  Mammogram 12/09/21 - Birads I.  Colonoscopy 08/2017 - recommended f/u in 5 years.  Scheduled for 10/2022.

## 2022-09-30 NOTE — Progress Notes (Signed)
Subjective:    [name], female    DOB: Jan 18, 1961, 61 y.o.   MRN: [id number]  Patient here for   HPI Here for a physical exam.  Was scheduled for a physical.  Has some blood pressure concerns.  Appt changed to f/u appt.  AM blood pressures -150/90.  Has doubled up on her medication the last couple of days.  No chest pain or sob reported.  No increased cough or congestion reported.  Previous visit - left shoulder pain. Seeing ortho.  Limited rom. Pain into neck.  Using CBD oil - knuckles on hand.  Helps. Off meloxicam.  Takes ibuprofen prn - 3-4/week.  No abdominal pain or bowel change reported.  Scheduled for colonoscopy 10/2022.    Past Medical History:  Diagnosis Date   Arthritis    Complication of anesthesia    pt states she is very sensitive to most drugs, usually needs 1/2 of whatever other people get. She states as a child/teenager she had difficulty waking up after surgery due to the sensitivity that she has to drugs.   Eczema    Frequent UTI    H/O lymphadenopathy    With previous submandibular node removals.   Hypertension    Recurrent sinus infections    Sleep apnea    uses c-pap   Past Surgical History:  Procedure Laterality Date   COLONOSCOPY WITH PROPOFOL N/A 08/25/2017   Procedure: COLONOSCOPY WITH PROPOFOL;  Surgeon: Lucilla Lame, MD;  Location: [facility] ENDOSCOPY;  Service: Endoscopy;  Laterality: N/A;   Lymph Node Removal  1984   NECK SURGERY  1977   H/O Right neck surgery secondary to a benign growth with when patient was 2 or 61 yo   skin lesion extraction  2013   basal cell - Dr Phillip Heal   Uterine Polyps Removed  2004   Family History  Problem Relation Age of Onset   Hyperlipidemia Mother    Thyroid disease Mother    Heart disease Mother        Heart Valve problems   Coronary artery disease Father        Stents placed   Hypertension Other        uncle   Parkinson's disease Paternal Uncle    Parkinson's disease Maternal Grandmother     Heart disease Maternal Grandfather    Heart disease Paternal Grandfather        MI   Breast cancer Neg Hx    Social History   Socioeconomic History   Marital status: Divorced    Spouse name: Not on file   Number of children: 2   Years of education: Not on file   Highest education level: Not on file  Occupational History    Employer: [employer]  Tobacco Use   Smoking status: Never   Smokeless tobacco: Never  Vaping Use   Vaping Use: Never used  Substance and Sexual Activity   Alcohol use: Yes    Alcohol/week: 0.0 standard drinks of alcohol    Comment: Occasional   Drug use: No   Sexual activity: Not on file  Other Topics Concern   Not on file  Social History Narrative   Not on file   Social Determinants of Health   Financial Resource Strain: Not on file  Food Insecurity: Not on file  Transportation Needs: Not on file  Physical Activity: Not on file  Stress: Not on file  Social Connections: Not on file     Review of  Systems  Constitutional:  Negative for appetite change and unexpected weight change.  HENT:  Negative for congestion and sinus pressure.   Respiratory:  Negative for cough, chest tightness and shortness of breath.   Cardiovascular:  Negative for chest pain, palpitations and leg swelling.  Gastrointestinal:  Negative for abdominal pain, diarrhea, nausea and vomiting.  Genitourinary:  Negative for difficulty urinating and dysuria.  Musculoskeletal:  Negative for joint swelling and myalgias.       Shoulder pain as outlined.   Skin:  Negative for color change and rash.  Neurological:  Negative for dizziness and headaches.  Psychiatric/Behavioral:  Negative for agitation and dysphoric mood.        Objective:     BP (!) 140/78 (BP Location: Right Arm)   Pulse 74   Temp 97.9 F (36.6 C) (Temporal)   Resp 14   Ht '5\' 4"'$  (1.626 m)   Wt 117 lb 12.8 oz (53.4 kg)   LMP 12/01/2008   SpO2 99%   BMI 20.22 kg/m  Wt Readings from Last 3 Encounters:   09/30/22 117 lb 12.8 oz (53.4 kg)  03/28/22 119 lb (54 kg)  09/27/21 119 lb (54 kg)    Physical Exam Vitals reviewed.  Constitutional:      General: She is not in acute distress.    Appearance: Normal appearance.  HENT:     Head: Normocephalic and atraumatic.     Right Ear: External ear normal.     Left Ear: External ear normal.  Eyes:     General: No scleral icterus.       Right eye: No discharge.        Left eye: No discharge.     Conjunctiva/sclera: Conjunctivae normal.  Neck:     Thyroid: No thyromegaly.  Cardiovascular:     Rate and Rhythm: Normal rate and regular rhythm.  Pulmonary:     Effort: No respiratory distress.     Breath sounds: Normal breath sounds. No wheezing.  Abdominal:     General: Bowel sounds are normal.     Palpations: Abdomen is soft.     Tenderness: There is no abdominal tenderness.  Musculoskeletal:        General: No swelling or tenderness.     Cervical back: Neck supple. No tenderness.  Lymphadenopathy:     Cervical: No cervical adenopathy.  Skin:    Findings: No erythema or rash.  Neurological:     Mental Status: She is alert.  Psychiatric:        Mood and Affect: Mood normal.        Behavior: Behavior normal.      Outpatient Encounter Medications as of 09/30/2022  Medication Sig   Blood Pressure Monitor MISC Check BP regularly.   Calcium Carb-Cholecalciferol (CALCIUM-VITAMIN D) 600-400 MG-UNIT TABS Take 1 tablet by mouth 3 (three) times a week.   Cholecalciferol 25 MCG (1000 UT) capsule Take by mouth.   clobetasol (TEMOVATE) 0.05 % external solution Apply to affected scalp daily as needed for flares   estradiol (ESTRACE VAGINAL) 0.1 MG/GM vaginal cream One application q hs for 5 nights and then 2 x/week (Patient taking differently: Place 1 Applicatorful vaginally once a week.)   fluocinonide (LIDEX) 0.05 % external solution APPLY TO AFFECTED AREA(S) AT BEDTIME (Patient taking differently: Apply 1 application  topically See admin  instructions. Use once or twice a week)   Glucosamine-Chondroit-Vit C-Mn (GLUCOSAMINE-CHONDROITIN) CAPS Take 1 capsule by mouth daily.   ibuprofen (ADVIL) 200 MG tablet  Take 200 mg by mouth every 4 (four) hours as needed for mild pain or moderate pain.    meloxicam (MOBIC) 7.5 MG tablet May take one to two tablets daily as needed for pain. Take with food.   Multiple Vitamin (MULTIVITAMIN) tablet Take 1 tablet by mouth daily. Centrum Silver 50+   Omega-3 Fatty Acids (FISH OIL) 1000 MG CAPS Take 1 capsule by mouth daily.   telmisartan (MICARDIS) 40 MG tablet Take 1 tablet (40 mg total) by mouth daily.   tretinoin (RETIN-A) 0.05 % cream Apply 1 application topically every 30 (thirty) days. Apply to affected area as needed for Acne   TURMERIC PO Take 1 capsule by mouth daily.   [DISCONTINUED] clobetasol (TEMOVATE) 0.05 % external solution Apply 1 Application topically daily as needed.   [DISCONTINUED] telmisartan (MICARDIS) 20 MG tablet Take 1 tablet by mouth daily.   No facility-administered encounter medications on file as of 09/30/2022.     Lab Results  Component Value Date   WBC 4.7 09/30/2022   HGB 13.0 09/30/2022   HCT 38.4 09/30/2022   PLT 345.0 09/30/2022   GLUCOSE 82 09/30/2022   CHOL 213 (H) 09/30/2022   TRIG 99.0 09/30/2022   HDL 71.40 09/30/2022   LDLDIRECT 148.9 08/15/2013   LDLCALC 122 (H) 09/30/2022   ALT 17 09/30/2022   AST 14 09/30/2022   NA 138 09/30/2022   K 4.2 09/30/2022   CL 101 09/30/2022   CREATININE 0.68 09/30/2022   BUN 13 09/30/2022   CO2 32 09/30/2022   TSH 2.08 04/03/2022    MM 3D SCREEN BREAST BILATERAL  Result Date: 12/09/2021 CLINICAL DATA:  Screening. EXAM: DIGITAL SCREENING BILATERAL MAMMOGRAM WITH TOMOSYNTHESIS AND CAD TECHNIQUE: Bilateral screening digital craniocaudal and mediolateral oblique mammograms were obtained. Bilateral screening digital breast tomosynthesis was performed. The images were evaluated with computer-aided detection.  COMPARISON:  Previous exam(s). ACR Breast Density Category c: The breast tissue is heterogeneously dense, which may obscure small masses. FINDINGS: There are no findings suspicious for malignancy. IMPRESSION: No mammographic evidence of malignancy. A result letter of this screening mammogram will be mailed directly to the patient. RECOMMENDATION: Screening mammogram in one year. (Code:SM-B-01Y) BI-RADS CATEGORY  1: Negative. Electronically Signed   By: Lovey Newcomer M.D.   On: 12/09/2021 16:23       Assessment & Plan:  Hypercholesterolemia Assessment & Plan: The 10-year ASCVD risk score (Arnett DK, et al., 2019) is: 5.2%   Values used to calculate the score:     Age: 35 years     Sex: Female     Is Non-Hispanic African American: No     Diabetic: No     Tobacco smoker: No     Systolic Blood Pressure: 381 mmHg     Is BP treated: Yes     HDL Cholesterol: 71.4 mg/dL     Total Cholesterol: 213 mg/dL  Low cholesterol diet and exercise.  Follow lipid panel.   Orders: -     CBC with Differential/Platelet -     Hepatic function panel -     Lipid panel  Essential hypertension, benign Assessment & Plan: Blood pressure elevated as outlined.  She has doubled up on micardis.  Will continue micardis - '40mg'$  q day. Follow pressures.  Follow metabolic panel.   Orders: -     Basic metabolic panel  Health care maintenance Assessment & Plan: Was initially scheduled for CPE today.  Changed to f/u.  PAP 09/17/20 - negative with negative HPV.  Mammogram 12/09/21 - Birads I.  Colonoscopy 08/2017 - recommended f/u in 5 years.  Scheduled for 10/2022.    Colon cancer screening Assessment & Plan: Scheduled for colonoscopy 10/17/22.    Left shoulder pain, unspecified chronicity Assessment & Plan: Seeing ortho.     Other orders -     Telmisartan; Take 1 tablet (40 mg total) by mouth daily.  Dispense: 30 tablet; Refill: 3     Einar Pheasant, MD

## 2022-10-01 ENCOUNTER — Encounter: Payer: Self-pay | Admitting: Gastroenterology

## 2022-10-01 ENCOUNTER — Other Ambulatory Visit: Payer: Self-pay

## 2022-10-12 ENCOUNTER — Encounter: Payer: Self-pay | Admitting: Internal Medicine

## 2022-10-12 NOTE — Assessment & Plan Note (Signed)
Scheduled for colonoscopy 10/17/22.

## 2022-10-12 NOTE — Assessment & Plan Note (Signed)
Blood pressure elevated as outlined.  She has doubled up on micardis.  Will continue micardis - '40mg'$  q day. Follow pressures.  Follow metabolic panel.

## 2022-10-12 NOTE — Assessment & Plan Note (Signed)
Seeing ortho.

## 2022-10-12 NOTE — Assessment & Plan Note (Signed)
The 10-year ASCVD risk score (Arnett DK, et al., 2019) is: 5.2%   Values used to calculate the score:     Age: 61 years     Sex: Female     Is Non-Hispanic African American: No     Diabetic: No     Tobacco smoker: No     Systolic Blood Pressure: 341 mmHg     Is BP treated: Yes     HDL Cholesterol: 71.4 mg/dL     Total Cholesterol: 213 mg/dL  Low cholesterol diet and exercise.  Follow lipid panel.

## 2022-10-16 ENCOUNTER — Other Ambulatory Visit: Payer: Self-pay | Admitting: Orthopaedic Surgery

## 2022-10-16 DIAGNOSIS — Z01818 Encounter for other preprocedural examination: Secondary | ICD-10-CM

## 2022-10-16 DIAGNOSIS — M19012 Primary osteoarthritis, left shoulder: Secondary | ICD-10-CM | POA: Diagnosis not present

## 2022-10-16 DIAGNOSIS — M25512 Pain in left shoulder: Secondary | ICD-10-CM | POA: Diagnosis not present

## 2022-10-16 LAB — BASIC METABOLIC PANEL WITH GFR
BUN: 15 (ref 4–21)
CO2: 30 — AB (ref 13–22)
Chloride: 105 (ref 99–108)
Creatinine: 0.6 (ref 0.5–1.1)
Glucose: 97
Potassium: 4.7 meq/L (ref 3.5–5.1)
Sodium: 142 (ref 137–147)

## 2022-10-16 LAB — COMPREHENSIVE METABOLIC PANEL
Albumin: 4.5 (ref 3.5–5.0)
Calcium: 9.9 (ref 8.7–10.7)
Globulin: 2.4
eGFR: 101

## 2022-10-16 LAB — HEPATIC FUNCTION PANEL
ALT: 18 U/L (ref 7–35)
AST: 17 (ref 13–35)
Alkaline Phosphatase: 60 (ref 25–125)
Bilirubin, Total: 1.1

## 2022-10-17 ENCOUNTER — Encounter: Payer: Self-pay | Admitting: Gastroenterology

## 2022-10-17 ENCOUNTER — Ambulatory Visit: Payer: Commercial Managed Care - PPO | Admitting: Anesthesiology

## 2022-10-17 ENCOUNTER — Ambulatory Visit
Admission: RE | Admit: 2022-10-17 | Discharge: 2022-10-17 | Disposition: A | Discharge status: Home / Self Care | Payer: Commercial Managed Care - PPO | Attending: Gastroenterology | Admitting: Gastroenterology

## 2022-10-17 ENCOUNTER — Other Ambulatory Visit: Payer: Self-pay

## 2022-10-17 ENCOUNTER — Encounter
Admission: RE | Disposition: A | Discharge status: Home / Self Care | Payer: Self-pay | Source: Home / Self Care | Attending: Gastroenterology

## 2022-10-17 DIAGNOSIS — I10 Essential (primary) hypertension: Secondary | ICD-10-CM | POA: Insufficient documentation

## 2022-10-17 DIAGNOSIS — G4730 Sleep apnea, unspecified: Secondary | ICD-10-CM | POA: Diagnosis not present

## 2022-10-17 DIAGNOSIS — K640 First degree hemorrhoids: Secondary | ICD-10-CM | POA: Diagnosis not present

## 2022-10-17 DIAGNOSIS — Z860100 Personal history of colon polyps, unspecified: Secondary | ICD-10-CM

## 2022-10-17 DIAGNOSIS — Z79899 Other long term (current) drug therapy: Secondary | ICD-10-CM | POA: Insufficient documentation

## 2022-10-17 DIAGNOSIS — Z1211 Encounter for screening for malignant neoplasm of colon: Secondary | ICD-10-CM | POA: Insufficient documentation

## 2022-10-17 HISTORY — PX: COLONOSCOPY WITH PROPOFOL: SHX5780

## 2022-10-17 SURGERY — COLONOSCOPY WITH PROPOFOL
Anesthesia: General | Site: Rectum

## 2022-10-17 MED ORDER — LIDOCAINE HCL (CARDIAC) PF 100 MG/5ML IV SOSY
PREFILLED_SYRINGE | INTRAVENOUS | Status: DC | PRN
  Administered 2022-10-17: 60 mg via INTRAVENOUS

## 2022-10-17 MED ORDER — STERILE WATER FOR IRRIGATION IR SOLN
Status: DC | PRN
  Administered 2022-10-17: 100 mL

## 2022-10-17 MED ORDER — ONDANSETRON HCL 4 MG/2ML IJ SOLN: 4.0000 mg | Freq: Once | INTRAMUSCULAR | Status: DC | PRN

## 2022-10-17 MED ORDER — LACTATED RINGERS IV SOLN: INTRAVENOUS | Status: DC

## 2022-10-17 MED ORDER — PROPOFOL 10 MG/ML IV BOLUS
INTRAVENOUS | Status: DC | PRN
  Administered 2022-10-17 (×3): 20 mg via INTRAVENOUS
  Administered 2022-10-17: 60 mg via INTRAVENOUS
  Administered 2022-10-17 (×6): 20 mg via INTRAVENOUS

## 2022-10-17 MED ORDER — FENTANYL CITRATE PF 50 MCG/ML IJ SOSY: 25.0000 ug | PREFILLED_SYRINGE | INTRAMUSCULAR | Status: DC | PRN

## 2022-10-17 MED ORDER — SODIUM CHLORIDE 0.9 % IV SOLN: INTRAVENOUS | Status: DC

## 2022-10-17 SURGICAL SUPPLY — 6 items
GOWN CVR UNV OPN BCK APRN NK (MISCELLANEOUS) ×2 IMPLANT
GOWN ISOL THUMB LOOP REG UNIV (MISCELLANEOUS) ×2
KIT PRC NS LF DISP ENDO (KITS) ×1 IMPLANT
KIT PROCEDURE OLYMPUS (KITS) ×1
MANIFOLD NEPTUNE II (INSTRUMENTS) ×1 IMPLANT
WATER STERILE IRR 250ML POUR (IV SOLUTION) ×1 IMPLANT

## 2022-10-17 NOTE — Anesthesia Postprocedure Evaluation (Signed)
Anesthesia Post Note  Patient: Sara Perry  Procedure(s) Performed: COLONOSCOPY WITH PROPOFOL (Rectum)  Patient location during evaluation: PACU Anesthesia Type: General Level of consciousness: awake and alert Pain management: pain level controlled Vital Signs Assessment: post-procedure vital signs reviewed and stable Respiratory status: spontaneous breathing, nonlabored ventilation, respiratory function stable and patient connected to nasal cannula oxygen Cardiovascular status: blood pressure returned to baseline and stable Postop Assessment: no apparent nausea or vomiting Anesthetic complications: no   No notable events documented.   Last Vitals:  Vitals:   10/17/22 1055 10/17/22 1100  BP:  119/86  Pulse: 94 84  Resp: 15 14  Temp:    SpO2: 98% 100%    Last Pain:  Vitals:   10/17/22 1052  TempSrc:   PainSc: [place] Rejeana Fadness

## 2022-10-17 NOTE — Anesthesia Preprocedure Evaluation (Signed)
Anesthesia Evaluation  Patient identified by MRN, date of birth, ID band Patient awake    Reviewed: Allergy & Precautions, H&P , NPO status , Patient's Chart, lab work & pertinent test results, reviewed documented beta blocker date and time   History of Anesthesia Complications (+) history of anesthetic complications  Airway Mallampati: II   Neck ROM: full    Dental  (+) Poor Dentition   Pulmonary sleep apnea    Pulmonary exam normal        Cardiovascular Exercise Tolerance: Good hypertension, On Medications negative cardio ROS Normal cardiovascular exam Rhythm:regular Rate:Normal     Neuro/Psych negative neurological ROS  negative psych ROS   GI/Hepatic negative GI ROS, Neg liver ROS,,,  Endo/Other  negative endocrine ROS    Renal/GU negative Renal ROS  negative genitourinary   Musculoskeletal   Abdominal   Peds  Hematology negative hematology ROS (+)   Anesthesia Other Findings Past Medical History: No date: Arthritis No date: Complication of anesthesia     Comment:  pt states she is very sensitive to most drugs, usually               needs 1/2 of whatever other people get. She states as a               child/teenager she had difficulty waking up after surgery              due to the sensitivity that she has to drugs. No date: Eczema No date: Frequent UTI No date: H/O lymphadenopathy     Comment:  With previous submandibular node removals. No date: Hypertension No date: Recurrent sinus infections No date: Sleep apnea     Comment:  uses c-pap Past Surgical History: 08/25/2017: COLONOSCOPY WITH PROPOFOL; N/A     Comment:  Procedure: COLONOSCOPY WITH PROPOFOL;  Surgeon: Lucilla Lame, MD;  Location: [facility] ENDOSCOPY;  Service:               Endoscopy;  Laterality: N/A; 1984: Lymph Node Removal 1977: NECK SURGERY     Comment:  H/O Right neck surgery secondary to a benign growth with               when patient was 62 or 62 yo 2013: skin lesion extraction     Comment:  basal cell - Dr Phillip Heal 2004: Uterine Polyps Removed BMI    Body Mass Index: 19.57 kg/m     Reproductive/Obstetrics negative OB ROS                             Anesthesia Physical Anesthesia Plan  ASA: 3  Anesthesia Plan: General   Post-op Pain Management:    Induction:   PONV Risk Score and Plan:   Airway Management Planned:   Additional Equipment:   Intra-op Plan:   Post-operative Plan:   Informed Consent: I have reviewed the patients History and Physical, chart, labs and discussed the procedure including the risks, benefits and alternatives for the proposed anesthesia with the patient or authorized representative who has indicated his/her understanding and acceptance.     Dental Advisory Given  Plan Discussed with: CRNA  Anesthesia Plan Comments:        Anesthesia Quick Evaluation

## 2022-10-17 NOTE — Transfer of Care (Signed)
Immediate Anesthesia Transfer of Care Note  Patient: Sara Perry  Procedure(s) Performed: COLONOSCOPY WITH PROPOFOL (Rectum)  Patient Location: PACU  Anesthesia Type: No value filed.  Level of Consciousness: awake, alert  and patient cooperative  Airway and Oxygen Therapy: Patient Spontanous Breathing and Patient connected to supplemental oxygen  Post-op Assessment: Post-op Vital signs reviewed, Patient's Cardiovascular Status Stable, Respiratory Function Stable, Patent Airway and No signs of Nausea or vomiting  Post-op Vital Signs: Reviewed and stable  Complications: No notable events documented.

## 2022-10-17 NOTE — H&P (Signed)
Sara Lame, MD [address] [place], [place] [address] Phone:[phone] Fax : [phone]  Primary Care Physician:  Einar Pheasant, MD Primary Gastroenterologist:  Dr. Allen Norris  Pre-Procedure History & Physical: HPI:  Sara Perry is a 62 y.o. female is here for an colonoscopy.   Past Medical History:  Diagnosis Date   Arthritis    Complication of anesthesia    pt states she is very sensitive to most drugs, usually needs 1/2 of whatever other people get. She states as a child/teenager she had difficulty waking up after surgery due to the sensitivity that she has to drugs.   Eczema    Frequent UTI    H/O lymphadenopathy    With previous submandibular node removals.   Hypertension    Recurrent sinus infections    Sleep apnea    uses c-pap    Past Surgical History:  Procedure Laterality Date   COLONOSCOPY WITH PROPOFOL N/A 08/25/2017   Procedure: COLONOSCOPY WITH PROPOFOL;  Surgeon: Sara Lame, MD;  Location: [facility] ENDOSCOPY;  Service: Endoscopy;  Laterality: N/A;   Lymph Node Removal  [place]   H/O Right neck surgery secondary to a benign growth with when patient was 24 or 62 yo   skin lesion extraction  2013   basal cell - Dr Phillip Heal   Uterine Polyps Removed  2004    Prior to Admission medications   Medication Sig Start Date End Date Taking? Authorizing Provider  Blood Pressure Monitor MISC Check BP regularly. 06/11/22  Yes Einar Pheasant, MD  Calcium Carb-Cholecalciferol (CALCIUM-VITAMIN D) 600-400 MG-UNIT TABS Take 1 tablet by mouth 3 (three) times a week.   Yes [provider]  Cholecalciferol 25 MCG (1000 UT) capsule Take by mouth.   Yes [provider]  clobetasol (TEMOVATE) 0.05 % external solution Apply to affected scalp daily as needed for flares 04/10/22  Yes   estradiol (ESTRACE VAGINAL) 0.1 MG/GM vaginal cream One application q hs for 5 nights and then 2 x/week Patient taking differently: Place 1  Applicatorful vaginally once a week. 03/20/17  Yes Einar Pheasant, MD  fluocinonide (LIDEX) 0.05 % external solution APPLY TO AFFECTED AREA(S) AT BEDTIME Patient taking differently: Apply 1 application  topically See admin instructions. Use once or twice a week 08/28/16  Yes Einar Pheasant, MD  Glucosamine-Chondroit-Vit C-Mn (GLUCOSAMINE-CHONDROITIN) CAPS Take 1 capsule by mouth daily.   Yes [provider]  ibuprofen (ADVIL) 200 MG tablet Take 200 mg by mouth every 4 (four) hours as needed for mild pain or moderate pain.    Yes [provider]  meloxicam (MOBIC) 7.5 MG tablet May take one to two tablets daily as needed for pain. Take with food. 12/06/19  Yes Burnard Hawthorne, FNP  Multiple Vitamin (MULTIVITAMIN) tablet Take 1 tablet by mouth daily. Centrum Silver 50+   Yes [provider]  Omega-3 Fatty Acids (FISH OIL) 1000 MG CAPS Take 1 capsule by mouth daily.   Yes [provider]  telmisartan (MICARDIS) 40 MG tablet Take 1 tablet (40 mg total) by mouth daily. 09/30/22  Yes Einar Pheasant, MD  tretinoin (RETIN-A) 0.05 % cream Apply 1 application topically every 30 (thirty) days. Apply to affected area as needed for Acne   Yes [provider]  TURMERIC PO Take 1 capsule by mouth daily.   Yes [provider]    Allergies as of 03/19/2022 - Review Complete 03/19/2022  Allergen Reaction Noted   Diclofenac  sodium Other (See Comments) 09/04/2020   Voltaren [diclofenac sodium]  12/22/2019    Family History  Problem Relation Age of Onset   Hyperlipidemia Mother    Thyroid disease Mother    Heart disease Mother        Heart Valve problems   Coronary artery disease Father        Stents placed   Hypertension Other        uncle   Parkinson's disease Paternal Uncle    Parkinson's disease Maternal Grandmother    Heart disease Maternal Grandfather    Heart disease Paternal Grandfather        MI   Breast cancer Neg Hx     Social  History   Socioeconomic History   Marital status: Divorced    Spouse name: Not on file   Number of children: 2   Years of education: Not on file   Highest education level: Not on file  Occupational History    Employer: [employer]  Tobacco Use   Smoking status: Never   Smokeless tobacco: Never  Vaping Use   Vaping Use: Never used  Substance and Sexual Activity   Alcohol use: Yes    Alcohol/week: 0.0 standard drinks of alcohol    Comment: Occasional   Drug use: No   Sexual activity: Not on file  Other Topics Concern   Not on file  Social History Narrative   Not on file   Social Determinants of Health   Financial Resource Strain: Not on file  Food Insecurity: Not on file  Transportation Needs: Not on file  Physical Activity: Not on file  Stress: Not on file  Social Connections: Not on file  Intimate Partner Violence: Not on file    Review of Systems: See HPI, otherwise negative ROS  Physical Exam: BP (!) 141/96   Pulse 82   Temp 98.4 F (36.9 C) (Temporal)   Resp 18   Ht '5\' 4"'$  (1.626 m)   Wt 51.7 kg   LMP 12/01/2008   SpO2 100%   BMI 19.57 kg/m  General:   Alert,  pleasant and cooperative in NAD Head:  Normocephalic and atraumatic. Neck:  Supple; no masses or thyromegaly. Lungs:  Clear throughout to auscultation.    Heart:  Regular rate and rhythm. Abdomen:  Soft, nontender and nondistended. Normal bowel sounds, without guarding, and without rebound.   Neurologic:  Alert and  oriented x4;  grossly normal neurologically.  Impression/Plan: [name] is here for an colonoscopy to be performed for a history of adenomatous polyps on 2018   Risks, benefits, limitations, and alternatives regarding  colonoscopy have been reviewed with the patient.  Questions have been answered.  All parties agreeable.   Sara Lame, MD  10/17/2022, 10:19 AM

## 2022-10-17 NOTE — Op Note (Signed)
[facility] Gastroenterology Patient Name: Sara Perry Procedure Date: 10/17/2022 10:22 AM MRN: [id number] Account #: [id number] Date of Birth: [birth year] Admit Type: Outpatient Age: 62 Room: [facility] OR ROOM 01 Gender: Female Note Status: Finalized Instrument Name: [id number] Procedure:             Colonoscopy Indications:           High risk colon cancer surveillance: Personal history                         of colonic polyps Providers:             Lucilla Lame MD, MD Referring MD:          Einar Pheasant, MD (Referring MD) Medicines:             Propofol per Anesthesia Complications:         No immediate complications. Procedure:             Pre-Anesthesia Assessment:                        - Prior to the procedure, a History and Physical was                         performed, and patient medications and allergies were                         reviewed. The patient's tolerance of previous                         anesthesia was also reviewed. The risks and benefits                         of the procedure and the sedation options and risks                         were discussed with the patient. All questions were                         answered, and informed consent was obtained. Prior                         Anticoagulants: The patient has taken no anticoagulant                         or antiplatelet agents. ASA Grade Assessment: II - A                         patient with mild systemic disease. After reviewing                         the risks and benefits, the patient was deemed in                         satisfactory condition to undergo the procedure.                        After obtaining informed consent, the colonoscope was  passed under direct vision. Throughout the procedure,                         the patient's blood pressure, pulse, and oxygen                         saturations were monitored continuously. The                          Colonoscope was introduced through the anus and                         advanced to the the cecum, identified by appendiceal                         orifice and ileocecal valve. The colonoscopy was                         performed without difficulty. The patient tolerated                         the procedure well. The quality of the bowel                         preparation was excellent. Findings:      The perianal and digital rectal examinations were normal.      Non-bleeding internal hemorrhoids were found during retroflexion. The       hemorrhoids were Grade I (internal hemorrhoids that do not prolapse). Impression:            - Non-bleeding internal hemorrhoids.                        - No specimens collected. Recommendation:        - Discharge patient to home.                        - Resume previous diet.                        - Continue present medications.                        - Repeat colonoscopy in 7 years for surveillance. Procedure Code(s):     --- Professional ---                        [phone], Colonoscopy, flexible; diagnostic, including                         collection of specimen(s) by brushing or washing, when                         performed (separate procedure) Diagnosis Code(s):     --- Professional ---                        Z86.010, Personal history of colonic polyps CPT copyright 2022 American Medical Association. All rights reserved. The codes documented in this report are preliminary and upon coder review may  be revised to meet current compliance requirements. Kaziah Krizek  Marcio Hoque MD, MD 10/17/2022 10:50:26 AM This report has been signed electronically. Number of Addenda: 0 Note Initiated On: 10/17/2022 10:22 AM Scope Withdrawal Time: 0 hours 8 minutes 53 seconds  Total Procedure Duration: 0 hours 14 minutes 33 seconds  Estimated Blood Loss:  Estimated blood loss: none.      [facility]

## 2022-10-20 ENCOUNTER — Encounter: Payer: Self-pay | Admitting: Gastroenterology

## 2022-10-22 ENCOUNTER — Telehealth: Payer: Self-pay

## 2022-10-22 NOTE — Telephone Encounter (Signed)
We received a fax today from [place] requesting clearance for surgery for patient.  I placed a copy of the clearance on [employer] and sent a copy to the provider's electronic file folder.

## 2022-10-23 NOTE — Telephone Encounter (Signed)
Pt sched for pre op

## 2022-10-27 ENCOUNTER — Other Ambulatory Visit: Payer: Self-pay

## 2022-11-03 ENCOUNTER — Ambulatory Visit
Admission: RE | Admit: 2022-11-03 | Discharge: 2022-11-03 | Disposition: A | Discharge status: Home / Self Care | Payer: 59 | Source: Ambulatory Visit | Attending: Orthopaedic Surgery | Admitting: Orthopaedic Surgery

## 2022-11-03 DIAGNOSIS — M19012 Primary osteoarthritis, left shoulder: Secondary | ICD-10-CM | POA: Diagnosis not present

## 2022-11-03 DIAGNOSIS — Z01818 Encounter for other preprocedural examination: Secondary | ICD-10-CM | POA: Diagnosis not present

## 2022-11-03 DIAGNOSIS — M25412 Effusion, left shoulder: Secondary | ICD-10-CM | POA: Diagnosis not present

## 2022-11-10 ENCOUNTER — Telehealth: Payer: Self-pay

## 2022-11-10 NOTE — Telephone Encounter (Signed)
Called patient to reschedule 2/6 appt. Patient needs pre-op. Was going to offer 1/29 at 2:30 but had to leave a message. If pt returns call- ok to schedule in 1/29 '@2'$ :30. If unable to make this appt please advise Dr Nicki Reaper will find work in spot for her.

## 2022-11-13 ENCOUNTER — Ambulatory Visit (INDEPENDENT_AMBULATORY_CARE_PROVIDER_SITE_OTHER): Payer: 59 | Admitting: Internal Medicine

## 2022-11-13 ENCOUNTER — Encounter: Payer: Self-pay | Admitting: Internal Medicine

## 2022-11-13 VITALS — BP 132/74 | HR 86 | Temp 98.2°F | Resp 16 | Ht 64.0 in | Wt 117.0 lb

## 2022-11-13 DIAGNOSIS — I10 Essential (primary) hypertension: Secondary | ICD-10-CM

## 2022-11-13 DIAGNOSIS — Z01818 Encounter for other preprocedural examination: Secondary | ICD-10-CM | POA: Diagnosis not present

## 2022-11-13 NOTE — Progress Notes (Signed)
Subjective:    [name], female    DOB: 11/06/1960, 62 y.o.   MRN: [id number]  Patient here for  Chief Complaint  Patient presents with   Pre-op Exam    HPI Here for pre op evaluation.  Planning for shoulder surgery.  She reports she is doing relatively well - other than her shoulder.  No chest pain or sob reported.  No cough or congestion.  No abdominal pain.  [place] moving.  Tries to stay active.  Blood pressures 128-130/70s.     Past Medical History:  Diagnosis Date   Arthritis    Complication of anesthesia    pt states she is very sensitive to most drugs, usually needs 1/2 of whatever other people get. She states as a child/teenager she had difficulty waking up after surgery due to the sensitivity that she has to drugs.   Eczema    Frequent UTI    H/O lymphadenopathy    With previous submandibular node removals.   Hypertension    Recurrent sinus infections    Sleep apnea    uses c-pap   Past Surgical History:  Procedure Laterality Date   COLONOSCOPY WITH PROPOFOL N/A 08/25/2017   Procedure: COLONOSCOPY WITH PROPOFOL;  Surgeon: Lucilla Lame, MD;  Location: [facility] ENDOSCOPY;  Service: Endoscopy;  Laterality: N/A;   COLONOSCOPY WITH PROPOFOL N/A 10/17/2022   Procedure: COLONOSCOPY WITH PROPOFOL;  Surgeon: Lucilla Lame, MD;  Location: [place];  Service: Endoscopy;  Laterality: N/A;   Lymph Node Removal  1984   NECK SURGERY  1977   H/O Right neck surgery secondary to a benign growth with when patient was 74 or 62 yo   skin lesion extraction  2013   basal cell - Dr Phillip Heal   Uterine Polyps Removed  2004   Family History  Problem Relation Age of Onset   Hyperlipidemia Mother    Thyroid disease Mother    Heart disease Mother        Heart Valve problems   Coronary artery disease Father        Stents placed   Hypertension Other        uncle   Parkinson's disease Paternal Uncle    Parkinson's disease Maternal Grandmother    Heart disease  Maternal Grandfather    Heart disease Paternal Grandfather        MI   Breast cancer Neg Hx    Social History   Socioeconomic History   Marital status: Divorced    Spouse name: Not on file   Number of children: 2   Years of education: Not on file   Highest education level: Not on file  Occupational History    Employer: [employer]  Tobacco Use   Smoking status: Never   Smokeless tobacco: Never  Vaping Use   Vaping Use: Never used  Substance and Sexual Activity   Alcohol use: Yes    Alcohol/week: 0.0 standard drinks of alcohol    Comment: Occasional   Drug use: No   Sexual activity: Not on file  Other Topics Concern   Not on file  Social History Narrative   Not on file   Social Determinants of Health   Financial Resource Strain: Not on file  Food Insecurity: Not on file  Transportation Needs: Not on file  Physical Activity: Not on file  Stress: Not on file  Social Connections: Not on file     Review of Systems  Constitutional:  Negative for appetite  change and unexpected weight change.  HENT:  Negative for congestion and sinus pressure.   Respiratory:  Negative for cough, chest tightness and shortness of breath.   Cardiovascular:  Negative for chest pain, palpitations and leg swelling.  Gastrointestinal:  Negative for abdominal pain, diarrhea, nausea and vomiting.  Genitourinary:  Negative for difficulty urinating and dysuria.  Musculoskeletal:  Negative for joint swelling and myalgias.  Skin:  Negative for color change and rash.  Neurological:  Negative for dizziness and headaches.  Psychiatric/Behavioral:  Negative for agitation and dysphoric mood.        Objective:     BP 132/74   Pulse 86   Temp 98.2 F (36.8 C)   Resp 16   Ht '5\' 4"'$  (1.626 m)   Wt 117 lb (53.1 kg)   LMP 12/01/2008   SpO2 99%   BMI 20.08 kg/m  Wt Readings from Last 3 Encounters:  11/13/22 117 lb (53.1 kg)  10/17/22 114 lb (51.7 kg)  09/30/22 117 lb 12.8 oz (53.4 kg)    Physical  Exam Vitals reviewed.  Constitutional:      General: She is not in acute distress.    Appearance: Normal appearance.  HENT:     Head: Normocephalic and atraumatic.     Right Ear: External ear normal.     Left Ear: External ear normal.  Eyes:     General: No scleral icterus.       Right eye: No discharge.        Left eye: No discharge.     Conjunctiva/sclera: Conjunctivae normal.  Neck:     Thyroid: No thyromegaly.  Cardiovascular:     Rate and Rhythm: Normal rate and regular rhythm.  Pulmonary:     Effort: No respiratory distress.     Breath sounds: Normal breath sounds. No wheezing.  Abdominal:     General: Bowel sounds are normal.     Palpations: Abdomen is soft.     Tenderness: There is no abdominal tenderness.  Musculoskeletal:        General: No swelling or tenderness.     Cervical back: Neck supple. No tenderness.  Lymphadenopathy:     Cervical: No cervical adenopathy.  Skin:    Findings: No erythema or rash.  Neurological:     Mental Status: She is alert.  Psychiatric:        Mood and Affect: Mood normal.        Behavior: Behavior normal.      Outpatient Encounter Medications as of 11/13/2022  Medication Sig   Blood Pressure Monitor MISC Check BP regularly.   Calcium Carb-Cholecalciferol (CALCIUM-VITAMIN D) 600-400 MG-UNIT TABS Take 1 tablet by mouth 3 (three) times a week.   Cholecalciferol 25 MCG (1000 UT) capsule Take by mouth.   clobetasol (TEMOVATE) 0.05 % external solution Apply to affected scalp daily as needed for flares   estradiol (ESTRACE VAGINAL) 0.1 MG/GM vaginal cream One application q hs for 5 nights and then 2 x/week (Patient taking differently: Place 1 Applicatorful vaginally once a week.)   fluocinonide (LIDEX) 0.05 % external solution APPLY TO AFFECTED AREA(S) AT BEDTIME (Patient taking differently: Apply 1 application  topically See admin instructions. Use once or twice a week)   Glucosamine-Chondroit-Vit C-Mn (GLUCOSAMINE-CHONDROITIN) CAPS  Take 1 capsule by mouth daily.   ibuprofen (ADVIL) 200 MG tablet Take 200 mg by mouth every 4 (four) hours as needed for mild pain or moderate pain.    meloxicam (MOBIC) 7.5 MG tablet May  take one to two tablets daily as needed for pain. Take with food.   Multiple Vitamin (MULTIVITAMIN) tablet Take 1 tablet by mouth daily. Centrum Silver 50+   Omega-3 Fatty Acids (FISH OIL) 1000 MG CAPS Take 1 capsule by mouth daily.   telmisartan (MICARDIS) 40 MG tablet Take 1 tablet (40 mg total) by mouth daily.   tretinoin (RETIN-A) 0.05 % cream Apply 1 application topically every 30 (thirty) days. Apply to affected area as needed for Acne   TURMERIC PO Take 1 capsule by mouth daily.   No facility-administered encounter medications on file as of 11/13/2022.     Lab Results  Component Value Date   WBC 4.7 09/30/2022   HGB 13.0 09/30/2022   HCT 38.4 09/30/2022   PLT 345.0 09/30/2022   GLUCOSE 82 09/30/2022   CHOL 213 (H) 09/30/2022   TRIG 99.0 09/30/2022   HDL 71.40 09/30/2022   LDLDIRECT 148.9 08/15/2013   LDLCALC 122 (H) 09/30/2022   ALT 18 10/16/2022   AST 17 10/16/2022   NA 142 10/16/2022   K 4.7 10/16/2022   CL 105 10/16/2022   CREATININE 0.6 10/16/2022   BUN 15 10/16/2022   CO2 30 (A) 10/16/2022   TSH 2.08 04/03/2022    CT SHOULDER LEFT WO CONTRAST  Result Date: 11/04/2022 CLINICAL DATA:  Chronic left shoulder pain. EXAM: CT OF THE UPPER LEFT EXTREMITY WITHOUT CONTRAST TECHNIQUE: Multidetector CT imaging of the upper left extremity was performed according to the standard protocol. RADIATION DOSE REDUCTION: This exam was performed according to the departmental dose-optimization program which includes automated exposure control, adjustment of the mA and/or kV according to patient size and/or use of iterative reconstruction technique. COMPARISON:  Left shoulder x-rays dated May 20, 2018. FINDINGS: Bones/Joint/Cartilage No fracture or dislocation. Severe osteoarthritis of the glenohumeral  joint with severe joint space narrowing, bone-on-bone appearance, subchondral sclerosis, subchondral cystic changes and marginal osteophytosis. Moderate joint effusion. Normal acromioclavicular joint. Ligaments Ligaments are suboptimally evaluated by CT. Muscles and Tendons Grossly intact.  No muscle atrophy. Soft tissue No fluid collection or hematoma. No soft tissue mass. The visualized left lung is clear. IMPRESSION: 1. Severe left glenohumeral osteoarthritis. Electronically Signed   By: Titus Dubin M.D.   On: 11/04/2022 09:32       Assessment & Plan:  Pre-op exam -     EKG 12-Lead  Pre-op evaluation Assessment & Plan: Planning for shoulder surgery.  EKG - SR with no acute ischemic changes.  No chest pain or sob reported.  Tries to stay active.  No cardiac symptoms with increased activity or exertion reported.  No cough or congestion.  Blood pressure doing ok on current medication.  Given above, I feel she is at low risk to proceed with planned surgery.  I do recommend close intra op and post op monitoring of her heart rate and blood pressure to avoid extremes.  Reviewed outside labs.  Electrolytes, glucose and kidney function tests are within normal limits.     Essential hypertension, benign Assessment & Plan: On micardis '40mg'$  q day.  Blood pressures as outlined.  Follow.  Recent metabolic panel wnl.       Einar Pheasant, MD

## 2022-11-16 ENCOUNTER — Encounter: Payer: Self-pay | Admitting: Internal Medicine

## 2022-11-16 DIAGNOSIS — Z01818 Encounter for other preprocedural examination: Secondary | ICD-10-CM | POA: Insufficient documentation

## 2022-11-16 NOTE — Assessment & Plan Note (Addendum)
Planning for shoulder surgery.  EKG - SR with no acute ischemic changes.  No chest pain or sob reported.  Tries to stay active.  No cardiac symptoms with increased activity or exertion reported.  No cough or congestion.  Blood pressure doing ok on current medication.  Given above, I feel she is at low risk to proceed with planned surgery.  I do recommend close intra op and post op monitoring of her heart rate and blood pressure to avoid extremes.  Reviewed outside labs.  Electrolytes, glucose and kidney function tests are within normal limits.

## 2022-11-16 NOTE — Assessment & Plan Note (Signed)
On micardis '40mg'$  q day.  Blood pressures as outlined.  Follow.  Recent metabolic panel wnl.

## 2022-11-18 ENCOUNTER — Ambulatory Visit: Payer: Commercial Managed Care - PPO | Admitting: Internal Medicine

## 2022-11-18 ENCOUNTER — Ambulatory Visit: Payer: 59 | Attending: Orthopaedic Surgery | Admitting: Physical Therapy

## 2022-11-18 DIAGNOSIS — M19012 Primary osteoarthritis, left shoulder: Secondary | ICD-10-CM | POA: Insufficient documentation

## 2022-11-18 DIAGNOSIS — M25512 Pain in left shoulder: Secondary | ICD-10-CM | POA: Insufficient documentation

## 2022-11-18 DIAGNOSIS — G8929 Other chronic pain: Secondary | ICD-10-CM | POA: Diagnosis not present

## 2022-11-18 NOTE — Therapy (Unsigned)
OUTPATIENT PHYSICAL THERAPY SHOULDER EVALUATION   Patient Name: Sara Perry MRN: [id number] DOB:12/12/1960, 62 y.o., female Today's Date: 11/19/2022  END OF SESSION:  PT End of Session - 11/18/22 1711     Visit Number 1    Number of Visits 20    Date for PT Re-Evaluation 01/27/23    Authorization Type [facility] [employer] [id number]    Authorization - Visit Number 1    Authorization - Number of Visits 20    Progress Note Due on Visit 10    PT Start Time 1630    PT Stop Time 1715    PT Time Calculation (min) 45 min    Activity Tolerance Patient limited by pain    Behavior During Therapy WFL for tasks assessed/performed             Past Medical History:  Diagnosis Date   Arthritis    Complication of anesthesia    pt states she is very sensitive to most drugs, usually needs 1/2 of whatever other people get. She states as a child/teenager she had difficulty waking up after surgery due to the sensitivity that she has to drugs.   Eczema    Frequent UTI    H/O lymphadenopathy    With previous submandibular node removals.   Hypertension    Recurrent sinus infections    Sleep apnea    uses c-pap   Past Surgical History:  Procedure Laterality Date   COLONOSCOPY WITH PROPOFOL N/A 08/25/2017   Procedure: COLONOSCOPY WITH PROPOFOL;  Surgeon: Lucilla Lame, MD;  Location: [facility] ENDOSCOPY;  Service: Endoscopy;  Laterality: N/A;   COLONOSCOPY WITH PROPOFOL N/A 10/17/2022   Procedure: COLONOSCOPY WITH PROPOFOL;  Surgeon: Lucilla Lame, MD;  Location: [place];  Service: Endoscopy;  Laterality: N/A;   L[facility] Node Removal  [facility]   H/O Right neck surgery secondary to a benign growth with when patient was 60 or 62 yo   skin lesion extraction  2013   basal cell - Dr Phillip Heal   Uterine Polyps Removed  2004   Patient Active Problem List   Diagnosis Date Noted   Pre-op evaluation 11/16/2022   History of colonic polyps 10/17/2022   Abdominal bruit 09/27/2021    Osteoarthritis 05/30/2021   Ganglion cyst 01/20/2021   Urinary frequency 09/17/2020   Sleep apnea 06/20/2020   Thumb pain, right 06/20/2020   Lumbar stenosis with neurogenic claudication 12/26/2019   Hyperbilirubinemia 12/14/2019   Eye injury 05/26/2019   Left shoulder pain 05/23/2018   Snoring 05/23/2018   Benign neoplasm of descending colon    Benign neoplasm of ascending colon    Vaginal atrophy 03/22/2017   Meniere's disease 08/17/2016   Colon cancer screening 08/11/2015   Fatigue 06/03/2015   Health care maintenance 06/03/2015   Neck pain 02/05/2014   Hypercholesterolemia 02/05/2014   Back pain 12/05/2012   Trigger finger 12/05/2012   Essential hypertension, benign 12/05/2012    PCP: Dr. Einar Pheasant   REFERRING PROVIDER: Dr. Ophelia Charter   REFERRING DIAG: Left Shoulder Pain   THERAPY DIAG:  Chronic left shoulder pain  Primary osteoarthritis of left shoulder  Rationale for Evaluation and Treatment: Rehabilitation  ONSET DATE: 10/14/2019  SUBJECTIVE:  SUBJECTIVE STATEMENT: See pertinent information   PERTINENT HISTORY: Pt reports that she is having left total shoulder surgery on February 22nd. It is a same day surgery. She did PT 4 years ago for her left shoulder and she has also tried PRP and gel injections, but these did not help with her shoulder pain. She has had prior lumbar spine surgery several years ago, and since then she has used arms more rather than rotate, which is why she thinks it caused overuse in left shoulder. She here clicking and popping in shoulder when she reaches above her head followed by pain. She states that she brought her post surgical shoulder brace in and she would like to know more about how to wear it correctly and what she can and cannot do post surgery like  whether she can use her left hand.   PAIN:  Are you having pain? Yes: NPRS scale: 8-9 /10 Pain location: All around the left shoulder joint  Pain description: Achy, sharp and shooting  Aggravating factors: Moving shoulder  Relieving factors: Some stretches and exercises   PRECAUTIONS: None  WEIGHT BEARING RESTRICTIONS: No  FALLS:  Has patient fallen in last 6 months? No  LIVING ENVIRONMENT:  Need to follow up about home environment  Lives with: lives with their family and Did not ask  Lives in: Other Did not ask  Stairs:  Did not ask  Has following equipment at home:  Did not ask   OCCUPATION: Ultrasound tech for [employer]   PLOF: Independent  PATIENT GOALS:She wants to regain her ability to reach with left arm and to feel less pain in her left arm.   NEXT MD VISIT: Feb 22nd   OBJECTIVE:   DIAGNOSTIC FINDINGS:  CLINICAL DATA:  Chronic left shoulder pain.   EXAM: CT OF THE UPPER LEFT EXTREMITY WITHOUT CONTRAST   TECHNIQUE: Multidetector CT imaging of the upper left extremity was performed according to the standard protocol.   RADIATION DOSE REDUCTION: This exam was performed according to the departmental dose-optimization program which includes automated exposure control, adjustment of the mA and/or kV according to patient size and/or use of iterative reconstruction technique.   COMPARISON:  Left shoulder x-rays dated May 20, 2018.   FINDINGS: Bones/Joint/Cartilage   No fracture or dislocation.   Severe osteoarthritis of the glenohumeral joint with severe joint space narrowing, bone-on-bone appearance, subchondral sclerosis, subchondral cystic changes and marginal osteophytosis. Moderate joint effusion.   Normal acromioclavicular joint.   Ligaments   Ligaments are suboptimally evaluated by CT.   Muscles and Tendons Grossly intact.  No muscle atrophy.   Soft tissue No fluid collection or hematoma. No soft tissue mass. The visualized left lung  is clear.   IMPRESSION: 1. Severe left glenohumeral osteoarthritis.     Electronically Signed   By: Titus Dubin M.D.   On: 11/04/2022 09:32  PATIENT SURVEYS:  FOTO 36/100 with target of 59   COGNITION: Overall cognitive status: Within functional limits for tasks assessed     SENSATION: WFL  POSTURE: No abnormalities noted   UPPER EXTREMITY ROM:   Active / PROM ROM Right eval Left eval  Shoulder flexion 180/180 90/180  Shoulder extension    Shoulder abduction 180/180 60*/60*  Shoulder adduction    Shoulder internal rotation (combined)  Thoracic Spine  PSIS   Shoulder external rotation    Elbow flexion    Elbow extension    Wrist flexion    Wrist extension    Wrist ulnar  deviation    Wrist radial deviation    Wrist pronation    Wrist supination    (Blank rows = not tested)  UPPER EXTREMITY MMT:  MMT Right eval Left eval  Shoulder flexion 5 3+*  Shoulder extension    Shoulder abduction    Shoulder adduction 5 3+*  Shoulder internal rotation    Shoulder external rotation    Middle trapezius    Lower trapezius    Elbow flexion    Elbow extension    Wrist flexion    Wrist extension    Wrist ulnar deviation    Wrist radial deviation    Wrist pronation    Wrist supination    Grip strength (lbs)    (Blank rows = not tested)    JOINT MOBILITY TESTING:  Crepitus and firm end field at 90 degrees flexion and abduction   PALPATION:  TTP around entire left shoulder joint    TODAY'S TREATMENT:                                                                                                                                         DATE:   11/18/22 Reviewed video on how to don and doff don joy shoulder brace  [email or web address]  Discussed total shoulder protocol and gave patient a copy of the protocol:  [email or web address]    PATIENT EDUCATION: Education details: form and technique for appropriate exercise and explanation about post rehab protocol  Person educated: Patient Education method: Explanation, Demonstration, Verbal cues, and Handouts Education comprehension: verbalized understanding, returned demonstration, and verbal cues required  HOME EXERCISE PROGRAM: Practice donning and doffing brace   ASSESSMENT:  CLINICAL IMPRESSION: Patient is a 62 y.o. white female who was seen today for physical therapy evaluation and treatment for preoperative evaluation for left total shoulder. 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 She will be undergoing the surgery on January 22nd and follow up with physical therapy several days afterward for check up. PT reviewed likely post rehab protocol for reverse total shoulder and deferred further questions about precautions to supervising surgeon.    OBJECTIVE IMPAIRMENTS: decreased ROM, decreased strength, impaired UE functional use, and pain.   ACTIVITY LIMITATIONS: carrying, lifting, bathing, dressing, and reach over head  PARTICIPATION LIMITATIONS: cleaning, laundry, shopping, and occupation  PERSONAL FACTORS: Age, Time since onset of injury/illness/exacerbation, and 1 comorbidity: lumber surgery  are also affecting patient's functional outcome.   REHAB POTENTIAL: Good  CLINICAL DECISION MAKING: Stable/uncomplicated  EVALUATION COMPLEXITY: Low   GOALS: Goals reviewed with patient? No  SHORT TERM GOALS: Target date: 12/03/2022  Pt will be independent with HEP in order to  improve strength and balance in  order to decrease fall risk and improve function at home and work. Baseline: NT  Goal status: INITIAL  2.  Patient will demonstrate knowledge of how to correctly don and doff left shoulder brace in order to be able to do it after surgery.   Baseline: Patient able to perform independently  Goal status: MET    LONG TERM GOALS: Target date: 01/28/2023  Patient will have improved function and activity level as evidenced by an increase in FOTO score by 10 points or more.  Baseline: 36/100 with target of 59  Goal status: INITIAL  2.  Patient will show symmetrical AROM between left shoulder with right shoulder to regain ability to perform reaching tasks for her job.  Baseline: Shoulder Flexion R/L: 180/90*, Shoulder Abduction R/L 180/60, Shoulder combined IR R/L Thoracic spine, PSIS  Goal status: INITIAL  3. Patient will show symmetrical strength between left shoulder with right shoulder to regain ability to perform reaching tasks for her job.  Baseline:  Goal status: INITIAL    PLAN:  PT FREQUENCY: 1-2x/week  PT DURATION: 10 weeks  PLANNED INTERVENTIONS: Therapeutic exercises, Neuromuscular re-education, Self Care, Joint mobilization, Joint manipulation, Aquatic Therapy, Dry Needling, Electrical stimulation, Spinal manipulation, Spinal mobilization, Cryotherapy, Moist heat, Manual therapy, and Re-evaluation  PLAN FOR NEXT SESSION: Show patient post rehab exercises: AROM elbow and wrist, AAROM shoulder flexion, GH table slide, seated horizontal table slide, pendulums   Bradly Chris PT, DPT  11/19/2022, 9:40 AM

## 2022-11-19 ENCOUNTER — Telehealth: Payer: Self-pay

## 2022-11-19 ENCOUNTER — Encounter: Payer: Self-pay | Admitting: Internal Medicine

## 2022-11-19 NOTE — Telephone Encounter (Signed)
Pre op form, EKG, and OV note sent to [facility]

## 2022-11-20 NOTE — Telephone Encounter (Signed)
Yes ok to change physical.  On the check out note, it stated to cancel physical on 12/02/22 and schedule physical in 3 months.  She was the last pt of the day and there may have not been anyone at the front.

## 2022-11-21 ENCOUNTER — Other Ambulatory Visit: Payer: Self-pay

## 2022-11-21 ENCOUNTER — Other Ambulatory Visit: Payer: Self-pay | Admitting: Internal Medicine

## 2022-11-21 NOTE — Telephone Encounter (Signed)
Pt returned call. I scheduled her physical for 5/9 @1pm$ .

## 2022-11-21 NOTE — Telephone Encounter (Signed)
Noted  

## 2022-11-24 ENCOUNTER — Other Ambulatory Visit: Payer: Self-pay

## 2022-11-24 MED ORDER — OXYCODONE HCL 5 MG PO TABS
5.0000 mg | ORAL_TABLET | Freq: Four times a day (QID) | ORAL | 0 refills | Status: DC | PRN
  Filled 2022-11-24: qty 30, 4d supply, fill #0

## 2022-11-24 MED ORDER — ESTRADIOL 0.1 MG/GM VA CREA
TOPICAL_CREAM | VAGINAL | 1 refills | Status: DC
  Filled 2022-11-24 – 2022-12-16 (×2): qty 42.5, 30d supply, fill #0
  Filled 2023-05-11: qty 42.5, 30d supply, fill #1

## 2022-11-24 MED ORDER — CELECOXIB 100 MG PO CAPS
100.0000 mg | ORAL_CAPSULE | Freq: Two times a day (BID) | ORAL | 2 refills | Status: DC
  Filled 2022-11-24: qty 60, 30d supply, fill #0
  Filled 2023-01-07: qty 60, 30d supply, fill #1
  Filled 2023-02-06: qty 60, 30d supply, fill #2

## 2022-11-24 MED ORDER — ONDANSETRON HCL 4 MG PO TABS
4.0000 mg | ORAL_TABLET | Freq: Four times a day (QID) | ORAL | 0 refills | Status: DC | PRN
  Filled 2022-11-24: qty 10, 3d supply, fill #0

## 2022-11-24 MED ORDER — ACETAMINOPHEN EXTRA STRENGTH 500 MG PO CAPS: 1000.0000 mg | ORAL_CAPSULE | Freq: Three times a day (TID) | ORAL | 0 refills | Status: AC | PRN

## 2022-11-24 MED ORDER — ASPIRIN 81 MG PO CHEW
81.0000 mg | CHEWABLE_TABLET | Freq: Every day | ORAL | 0 refills | Status: DC
  Filled 2022-11-24: qty 90, 90d supply, fill #0

## 2022-11-27 ENCOUNTER — Other Ambulatory Visit: Payer: Self-pay

## 2022-11-27 ENCOUNTER — Encounter (HOSPITAL_BASED_OUTPATIENT_CLINIC_OR_DEPARTMENT_OTHER): Payer: Self-pay | Admitting: Orthopaedic Surgery

## 2022-11-28 ENCOUNTER — Encounter (HOSPITAL_BASED_OUTPATIENT_CLINIC_OR_DEPARTMENT_OTHER)
Admission: RE | Admit: 2022-11-28 | Discharge: 2022-11-28 | Disposition: A | Discharge status: Home / Self Care | Payer: 59 | Source: Ambulatory Visit | Attending: Orthopaedic Surgery | Admitting: Orthopaedic Surgery

## 2022-11-28 DIAGNOSIS — Z01812 Encounter for preprocedural laboratory examination: Secondary | ICD-10-CM | POA: Insufficient documentation

## 2022-11-28 DIAGNOSIS — M25512 Pain in left shoulder: Secondary | ICD-10-CM | POA: Insufficient documentation

## 2022-11-28 LAB — SURGICAL PCR SCREEN
MRSA, PCR: NEGATIVE
Staphylococcus aureus: NEGATIVE

## 2022-11-28 NOTE — Progress Notes (Signed)
Surgical soap given with instructions, pt verbalized understanding. Benzoyl peroxide gel given with instructions, pt verbalized understanding.

## 2022-12-02 ENCOUNTER — Encounter: Payer: Commercial Managed Care - PPO | Admitting: Internal Medicine

## 2022-12-03 ENCOUNTER — Encounter (HOSPITAL_COMMUNITY): Payer: Self-pay | Admitting: Certified Registered"

## 2022-12-03 ENCOUNTER — Telehealth: Payer: Self-pay | Admitting: Physical Therapy

## 2022-12-03 NOTE — Telephone Encounter (Signed)
Spoke to patient about drafting letter and faxing to doctor about need for further medical intervention and poor rehab potential given severity of left shoulder OA. Drafter and faxed letter to Dr. Ophelia Charter.

## 2022-12-04 ENCOUNTER — Ambulatory Visit (HOSPITAL_BASED_OUTPATIENT_CLINIC_OR_DEPARTMENT_OTHER): Admission: RE | Admit: 2022-12-04 | Payer: 59 | Source: Home / Self Care | Admitting: Orthopaedic Surgery

## 2022-12-04 ENCOUNTER — Telehealth: Payer: Self-pay | Admitting: Physical Therapy

## 2022-12-04 DIAGNOSIS — Z01818 Encounter for other preprocedural examination: Secondary | ICD-10-CM

## 2022-12-04 SURGERY — ARTHROPLASTY, SHOULDER, TOTAL
Anesthesia: General | Site: Shoulder | Laterality: Left

## 2022-12-04 NOTE — Telephone Encounter (Signed)
Called pt to determine whether she would still be coming into appointment on Monday given that her shoulder surgery has been delayed. Instructed pt to call back to confirm.

## 2022-12-08 ENCOUNTER — Encounter: Payer: Self-pay | Admitting: Physical Therapy

## 2022-12-08 ENCOUNTER — Ambulatory Visit: Payer: 59 | Admitting: Physical Therapy

## 2022-12-08 DIAGNOSIS — M19012 Primary osteoarthritis, left shoulder: Secondary | ICD-10-CM | POA: Diagnosis not present

## 2022-12-08 DIAGNOSIS — M25512 Pain in left shoulder: Secondary | ICD-10-CM | POA: Diagnosis not present

## 2022-12-08 DIAGNOSIS — G8929 Other chronic pain: Secondary | ICD-10-CM

## 2022-12-08 NOTE — Therapy (Signed)
OUTPATIENT PHYSICAL THERAPY TREATMENT NOTE   Patient Name: Sara Perry MRN: [id number] DOB:[birth year], 61 y.o., female Today's Date: 12/08/2022  PCP: Dr. Einar Pheasant  REFERRING PROVIDER: Dr. Ophelia Charter   END OF SESSION:   PT End of Session - 12/08/22 0814     Visit Number 2    Number of Visits 20    Date for PT Re-Evaluation 01/27/23    Authorization Type [facility] [employer] [id number]    Authorization - Visit Number 2    Authorization - Number of Visits 20    Progress Note Due on Visit 10    PT Start Time 0805    PT Stop Time 0845    PT Time Calculation (min) 40 min    Activity Tolerance Patient limited by pain    Behavior During Therapy Agitated             Past Medical History:  Diagnosis Date   Arthritis    Complication of anesthesia    pt states she is very sensitive to most drugs, usually needs 1/2 of whatever other people get. She states as a child/teenager she had difficulty waking up after surgery due to the sensitivity that she has to drugs.   Eczema    Frequent UTI    H/O lymphadenopathy    With previous submandibular node removals.   Hypertension    Recurrent sinus infections    Sleep apnea    uses c-pap   Past Surgical History:  Procedure Laterality Date   COLONOSCOPY WITH PROPOFOL N/A 08/25/2017   Procedure: COLONOSCOPY WITH PROPOFOL;  Surgeon: Lucilla Lame, MD;  Location: [facility] ENDOSCOPY;  Service: Endoscopy;  Laterality: N/A;   COLONOSCOPY WITH PROPOFOL N/A 10/17/2022   Procedure: COLONOSCOPY WITH PROPOFOL;  Surgeon: Lucilla Lame, MD;  Location: [place];  Service: Endoscopy;  Laterality: N/A;   LUMBAR FUSION  12/26/2019   l4-l5   Lymph Node Removal  1984   NECK SURGERY  1977   H/O Right neck surgery secondary to a benign growth with when patient was 59 or 62 yo   skin lesion extraction  2013   basal cell - Dr Phillip Heal   Uterine Polyps Removed  2004   Patient Active Problem List   Diagnosis Date Noted   Pre-op evaluation 11/16/2022    History of colonic polyps 10/17/2022   Abdominal bruit 09/27/2021   Osteoarthritis 05/30/2021   Ganglion cyst 01/20/2021   Urinary frequency 09/17/2020   Sleep apnea 06/20/2020   Thumb pain, right 06/20/2020   Lumbar stenosis with neurogenic claudication 12/26/2019   Hyperbilirubinemia 12/14/2019   Eye injury 05/26/2019   Left shoulder pain 05/23/2018   Snoring 05/23/2018   Benign neoplasm of descending colon    Benign neoplasm of ascending colon    Vaginal atrophy 03/22/2017   Meniere's disease 08/17/2016   Colon cancer screening 08/11/2015   Fatigue 06/03/2015   Health care maintenance 06/03/2015   Neck pain 02/05/2014   Hypercholesterolemia 02/05/2014   Back pain 12/05/2012   Trigger finger 12/05/2012   Essential hypertension, benign 12/05/2012    REFERRING DIAG: Left shoulder pain, left shoulder osteoarthritis   THERAPY DIAG:  Chronic left shoulder pain  Primary osteoarthritis of left shoulder  Rationale for Evaluation and Treatment Rehabilitation  PERTINENT HISTORY: Pt reports that she is having left total shoulder surgery on February 22nd. It is a same day surgery. She did PT 4 years ago for her left shoulder and she has also tried PRP and gel injections, but  these did not help with her shoulder pain. She has had prior lumbar spine surgery several years ago, and since then she has used arms more rather than rotate, which is why she thinks it caused overuse in left shoulder. She here clicking and popping in shoulder when she reaches above her head followed by pain. She states that she brought her post surgical shoulder brace in and she would like to know more about how to wear it correctly and what she can and cannot do post surgery like whether she can use her left hand.   PRECAUTIONS: None   SUBJECTIVE:                                                                                                                                                                                       SUBJECTIVE STATEMENT:  Pt reports that she is waiting to hear back form [employer] about authorization for her surgery, which was originally planned for this time period. [place] Falling has since ordered that she completed 6 weeks of physical therapy before she is eligible for surgery. She continues to feel same level of left shoulder pain along with crepitus especially with reaching overhead.    PAIN:  Are you having pain? Yes: NPRS scale: 8/10 Pain location: Left shoulder  Pain description: Achy Aggravating factors: Moving overhead  Relieving factors: Heat and that is about it    OBJECTIVE: (objective measures completed at initial evaluation unless otherwise dated)   DIAGNOSTIC FINDINGS:  CLINICAL DATA:  Chronic left shoulder pain.   EXAM: CT OF THE UPPER LEFT EXTREMITY WITHOUT CONTRAST   TECHNIQUE: Multidetector CT imaging of the upper left extremity was performed according to the standard protocol.   RADIATION DOSE REDUCTION: This exam was performed according to the departmental dose-optimization program which includes automated exposure control, adjustment of the mA and/or kV according to patient size and/or use of iterative reconstruction technique.   COMPARISON:  Left shoulder x-rays dated May 20, 2018.   FINDINGS: Bones/Joint/Cartilage   No fracture or dislocation.   Severe osteoarthritis of the glenohumeral joint with severe joint space narrowing, bone-on-bone appearance, subchondral sclerosis, subchondral cystic changes and marginal osteophytosis. Moderate joint effusion.   Normal acromioclavicular joint.   Ligaments   Ligaments are suboptimally evaluated by CT.   Muscles and Tendons Grossly intact.  No muscle atrophy.   Soft tissue No fluid collection or hematoma. No soft tissue mass. The visualized left lung is clear.   IMPRESSION: 1. Severe left glenohumeral osteoarthritis.     Electronically Signed   By: Titus Dubin M.D.   On: 11/04/2022  09:32   PATIENT SURVEYS:  FOTO 36/100 with  target of 59    COGNITION: Overall cognitive status: Within functional limits for tasks assessed                                  SENSATION: WFL   POSTURE: No abnormalities noted    UPPER EXTREMITY ROM:    Active / PROM ROM Right eval Left eval  Shoulder flexion 180/180 90/180  Shoulder extension      Shoulder abduction 180/180 60*/60*  Shoulder adduction      Shoulder internal rotation (combined)  Thoracic Spine  PSIS   Shoulder external rotation      Elbow flexion      Elbow extension      Wrist flexion      Wrist extension      Wrist ulnar deviation      Wrist radial deviation      Wrist pronation      Wrist supination      (Blank rows = not tested)   UPPER EXTREMITY MMT:   MMT Right eval Left eval  Shoulder flexion 5 3+*  Shoulder extension      Shoulder abduction      Shoulder adduction 5 3+*  Shoulder internal rotation      Shoulder external rotation      Middle trapezius      Lower trapezius      Elbow flexion      Elbow extension      Wrist flexion      Wrist extension      Wrist ulnar deviation      Wrist radial deviation      Wrist pronation      Wrist supination      Grip strength (lbs)      (Blank rows = not tested)       JOINT MOBILITY TESTING:  Crepitus and firm end field at 90 degrees flexion and abduction    PALPATION:  TTP around entire left shoulder joint              TODAY'S TREATMENT:                                                                                                                                         DATE:   12/08/22:  THEREX: LUE Shoulder flexion isometric 3 sec hold x 10  LUE Shoulder ER isometric 3 sec hold x 5 -Pt reports increased crepitus and pain that is limiting her activity LUE Shoulder IR isometric 3 sec hold x 10 LUE Shoulder Extension isometric 3 sec hold x 10   MANUAL: Demonstration with theracane for trigger point release  -Pt struggles to find  trigger point on medial border of left shoulder blade   Trigger point release and massage of rhomboids on medial border of left shoulder blade  11/18/22 Reviewed video on how to don and doff don joy shoulder brace  [email or web address]   Discussed total shoulder protocol and gave patient a copy of the protocol: [email or web address]       PATIENT EDUCATION: Education details: form and technique for appropriate exercise and explanation about post rehab protocol  Person educated: Patient Education method: Explanation, Demonstration, Verbal cues, and Handouts Education comprehension: verbalized understanding, returned demonstration, and verbal cues required   HOME EXERCISE PROGRAM: Access Code: [email or web address] URL: [email or web address] Date: 12/08/2022 Prepared by: Bradly Chris  Exercises - Seated Upper Trapezius Stretch  - 1 x daily - 3 reps - 30-60 sec hold - Isometric Shoulder Flexion at Wall  - 1 x daily - 2 sets - 10 reps - 3 sec hold - Standing Isometric Shoulder Internal Rotation at Doorway  - 3 x weekly - 3 sets - 10 reps -  3 sec  hold   ASSESSMENT:   CLINICAL IMPRESSION:            Pt presents for f/u for left shoulder pain the setting of severe glenohumeral joint arthritis. She continues to be limited with most exercises with severe crepitus and pain in left shoulder especially when flexing and abducting past 90 degrees. She continues to rely on right arm to passively move left arm at work to perform job related tasks as well as rotational movements to reach. Session limited to isometrics given severity of pt's pain response. It is questionable given severity of OA and pt's pain response how much patient will benefit from PT. PT to continue to progress shoulder exercises within pain tolerance.      OBJECTIVE  IMPAIRMENTS: decreased ROM, decreased strength, impaired UE functional use, and pain.    ACTIVITY LIMITATIONS: carrying, lifting, bathing, dressing, and reach over head   PARTICIPATION LIMITATIONS: cleaning, laundry, shopping, and occupation   PERSONAL FACTORS: Age, Time since onset of injury/illness/exacerbation, and 1 comorbidity: lumber surgery  are also affecting patient's functional outcome.    REHAB POTENTIAL: Good   CLINICAL DECISION MAKING: Stable/uncomplicated   EVALUATION COMPLEXITY: Low     GOALS: Goals reviewed with patient? No   SHORT TERM GOALS: Target date: 12/03/2022   Pt will be independent with HEP in order to improve strength and balance in order to decrease fall risk and improve function at home and work. Baseline: NT  Goal status: Ongoing    2.  Patient will demonstrate knowledge of how to correctly don and doff left shoulder brace in order to be able to do it after surgery.   Baseline: Patient able to perform independently  Goal status: MET       LONG TERM GOALS: Target date: 01/28/2023   Patient will have improved function and activity level as evidenced by an increase in FOTO score by 10 points or more.  Baseline: 36/100 with target of 59  Goal status: Ongoing    2.  Patient will show symmetrical AROM between left shoulder with right shoulder to regain ability to perform reaching tasks for her job.  Baseline: Shoulder Flexion R/L: 180/90*, Shoulder Abduction R/L 180/60, Shoulder combined IR R/L Thoracic spine, PSIS  Goal status: Ongoing   3. Patient will show symmetrical strength between left shoulder with right shoulder to regain ability to perform reaching tasks for her job.  Baseline: Unable to flex or abduct LUE past 90 degrees  Goal status: Ongoing        PLAN:   PT FREQUENCY: 1-2x/week   PT DURATION: 10 weeks  PLANNED INTERVENTIONS: Therapeutic exercises, Neuromuscular re-education, Self Care, Joint mobilization, Joint manipulation,  Aquatic Therapy, Dry Needling, Electrical stimulation, Spinal manipulation, Spinal mobilization, Cryotherapy, Moist heat, Manual therapy, and Re-evaluation   PLAN FOR NEXT SESSION: Show patient post rehab exercises: AROM elbow and wrist, AAROM shoulder flexion, GH table slide, seated horizontal table slide, pendulums, progress parascapular exercises    Bradly Chris PT, DPT  12/08/2022, 12:07 PM

## 2022-12-10 ENCOUNTER — Ambulatory Visit: Payer: 59 | Admitting: Physical Therapy

## 2022-12-15 ENCOUNTER — Ambulatory Visit: Payer: 59 | Admitting: Physical Therapy

## 2022-12-16 ENCOUNTER — Other Ambulatory Visit: Payer: Self-pay

## 2022-12-16 ENCOUNTER — Encounter: Payer: Self-pay | Admitting: Internal Medicine

## 2022-12-16 NOTE — Telephone Encounter (Signed)
Lm for pt to cb.

## 2022-12-16 NOTE — Telephone Encounter (Signed)
Please call and let her know, she will need to monitor blood pressure as well while on celebrex.  Monitor for GI irritation. Take with food.  Can recheck liver panel 7-10 days after starting.

## 2022-12-17 ENCOUNTER — Other Ambulatory Visit: Payer: Self-pay

## 2022-12-17 DIAGNOSIS — E7800 Pure hypercholesterolemia, unspecified: Secondary | ICD-10-CM

## 2022-12-17 NOTE — H&P (Signed)
PREOPERATIVE H&P  Chief Complaint: DJD LEFT SHOULDER  HPI: Sara Perry is a 62 y.o. female who is scheduled for Procedure(s): TOTAL SHOULDER ARTHROPLASTY.   Patient has a past medical history significant for HTN, sleep apnea on CPAP.   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  She has continued pain in her left shoulder.   Symptoms are rated as moderate to severe, and have been worsening.  This is significantly impairing activities of daily living.    Please see clinic note for further details on this patient's care.    She has elected for surgical management.   Past Medical History:  Diagnosis Date   Arthritis    Complication of anesthesia    pt states she is very sensitive to most drugs, usually needs 1/2 of whatever other people get. She states as a child/teenager she had difficulty waking up after surgery due to the sensitivity that she has to drugs.   Eczema    Frequent UTI    H/O lymphadenopathy    With previous submandibular node removals.   Hypertension    Recurrent sinus infections    Sleep apnea    uses c-pap   Past Surgical History:  Procedure Laterality Date   COLONOSCOPY WITH PROPOFOL N/A 08/25/2017   Procedure: COLONOSCOPY WITH PROPOFOL;  Surgeon: Lucilla Lame, MD;  Location: [facility] ENDOSCOPY;  Service: Endoscopy;  Laterality: N/A;   COLONOSCOPY WITH PROPOFOL N/A 10/17/2022   Procedure: COLONOSCOPY WITH PROPOFOL;  Surgeon: Lucilla Lame, MD;  Location: [place];  Service: Endoscopy;  Laterality: N/A;   LUMBAR FUSION  12/26/2019   l4-l5   Lymph Node Removal  1984   NECK SURGERY  1977   H/O Right neck surgery secondary to a benign growth with when patient was 8 or 62 yo   skin lesion extraction  2013   basal cell - Dr Phillip Heal   Uterine Polyps Removed  2004   Social History   Socioeconomic History   Marital status: Divorced     Spouse name: Not on file   Number of children: 2   Years of education: Not on file   Highest education level: Not on file  Occupational History    Employer: [employer]  Tobacco Use   Smoking status: Never   Smokeless tobacco: Never  Vaping Use   Vaping Use: Never used  Substance and Sexual Activity   Alcohol use: Yes    Alcohol/week: 0.0 standard drinks of alcohol    Comment: Occasional   Drug use: No   Sexual activity: Not on file  Other Topics Concern   Not on file  Social History Narrative   Not on file   Social Determinants of Health   Financial Resource Strain: Not on file  Food Insecurity: Not on file  Transportation Needs: Not on file  Physical Activity: Not on file  Stress: Not on file  Social Connections: Not on file   Family History  Problem Relation Age of Onset   Hyperlipidemia Mother    Thyroid disease Mother    Heart disease Mother        Heart Valve problems   Coronary artery disease Father        Stents placed   Hypertension Other        uncle   Parkinson's disease Paternal Uncle    Parkinson's disease Maternal Grandmother    Heart disease Maternal Grandfather  Heart disease Paternal Grandfather        MI   Breast cancer Neg Hx    Allergies  Allergen Reactions   Diclofenac Sodium Other (See Comments)    Affected her liver function, so she can't take this.   Voltaren [Diclofenac Sodium]     Liver problem   Prior to Admission medications   Medication Sig Start Date End Date Taking? Authorizing Provider  Acetaminophen Extra Strength 500 MG CAPS Take 2 tablets (1,000 mg) by mouth every 8 (eight) hours as needed. 11/24/22     aspirin 81 MG chewable tablet Chew 1 tablet (81 mg total) by mouth daily for 6 weeks for post-op DVT prophylaxis. 11/24/22     Blood Pressure Monitor MISC Check BP regularly. 06/11/22   Einar Pheasant, MD  Calcium Carb-Cholecalciferol (CALCIUM-VITAMIN D) 600-400 MG-UNIT TABS Take 1 tablet by mouth 3 (three) times a week.     [provider]  celecoxib (CELEBREX) 100 MG capsule Take 1 capsule (100 mg total) by mouth 2 (two) times daily. 11/24/22     Cholecalciferol 25 MCG (1000 UT) capsule Take by mouth.    [provider]  clobetasol (TEMOVATE) 0.05 % external solution Apply to affected scalp daily as needed for flares 04/10/22     estradiol (ESTRACE VAGINAL) 0.1 MG/GM vaginal cream Use 1 application at night for 5 nights and then use 2 times per week 11/24/22   Einar Pheasant, MD  fluocinonide (LIDEX) 0.05 % external solution APPLY TO AFFECTED AREA(S) AT BEDTIME Patient taking differently: Apply 1 application  topically See admin instructions. Use once or twice a week 08/28/16   Einar Pheasant, MD  Glucosamine-Chondroit-Vit C-Mn (GLUCOSAMINE-CHONDROITIN) CAPS Take 1 capsule by mouth daily.    [provider]  ibuprofen (ADVIL) 200 MG tablet Take 200 mg by mouth every 4 (four) hours as needed for mild pain or moderate pain.     [provider]  meloxicam (MOBIC) 7.5 MG tablet May take one to two tablets daily as needed for pain. Take with food. 12/06/19   Burnard Hawthorne, FNP  Multiple Vitamin (MULTIVITAMIN) tablet Take 1 tablet by mouth daily. Centrum Silver 50+    [provider]  Omega-3 Fatty Acids (FISH OIL) 1000 MG CAPS Take 1 capsule by mouth daily.    [provider]  ondansetron (ZOFRAN) 4 MG tablet Take 1 tablet (4 mg total) by mouth every 6 (six) hours as needed for nausea / vomiting. 11/24/22     oxyCODONE (OXY IR/ROXICODONE) 5 MG immediate release tablet Take 1-2 tablets (5-10 mg total) by mouth every 6 (six) hours as needed no more than 6 pills a day. 11/24/22     telmisartan (MICARDIS) 40 MG tablet Take 1 tablet (40 mg total) by mouth daily. 09/30/22   Einar Pheasant, MD  tretinoin (RETIN-A) 0.05 % cream Apply 1 application topically every 30 (thirty) days. Apply to affected area as needed for Acne    [provider]  TURMERIC PO Take 1 capsule  by mouth daily.    [provider]    ROS: All other systems have been reviewed and were otherwise negative with the exception of those mentioned in the HPI and as above.  Physical Exam: General: Alert, no acute distress Cardiovascular: No pedal edema Respiratory: No cyanosis, no use of accessory musculature GI: No organomegaly, abdomen is soft and non-tender Skin: No lesions in the area of chief complaint Neurologic: Sensation intact distally Psychiatric: Patient is competent for consent with  normal mood and affect Lymphatic: No axillary or cervical lymphadenopathy  MUSCULOSKELETAL:  Range of motion of the shoulder is to 160 degrees compared to 180.  External rotation to 30 versus 60.  She has 5- supraspinatus and infraspinatus strength; however, she does have strength.  It is just painful during testing.    Imaging: X-rays reviewed and demonstrate B2 glenoid on three views of the shoulder with end stage primary glenohumeral arthritis.    BMI: Estimated body mass index is 20.06 kg/m as calculated from the following:   Height as of this encounter: '5\' 4"'$  (1.626 m).   Weight as of this encounter: 53 kg.  Lab Results  Component Value Date   ALBUMIN 4.5 10/16/2022   Diabetes: Patient does not have a diagnosis of diabetes.     Smoking Status:   reports that she has never smoked. She has never used smokeless tobacco.     Assessment: DJD LEFT SHOULDER  Plan: Plan for Procedure(s): TOTAL SHOULDER ARTHROPLASTY  The risks benefits and alternatives were discussed with the patient including but not limited to the risks of nonoperative treatment, versus surgical intervention including infection, bleeding, nerve injury,  blood clots, cardiopulmonary complications, morbidity, mortality, among others, and they were willing to proceed.   We additionally specifically discussed risks of axillary nerve injury, infection, periprosthetic fracture, continued pain and longevity of  implants prior to beginning procedure.    Patient will be closely monitored in PACU for medical stabilization and pain control. If found stable in PACU, patient may be discharged home with outpatient follow-up. If any concerns regarding patient's stabilization patient will be admitted for observation after surgery. The patient is planning to be discharged home with outpatient PT.   The patient acknowledged the explanation, agreed to proceed with the plan and consent was signed.   Operative Plan: Left anatomic total shoulder arthroplasty  Discharge Medications: standard - medications sent 11/24/22 DVT Prophylaxis: aspirin Physical Therapy: outpatient PT Special Discharge needs: [place]. [place], PA-C  12/17/2022 3:56 PM

## 2022-12-17 NOTE — Telephone Encounter (Signed)
Patient returned office phone call and note was read. 

## 2022-12-17 NOTE — Anesthesia Preprocedure Evaluation (Addendum)
Anesthesia Evaluation  Patient identified by MRN, date of birth, ID band Patient awake    Reviewed: Allergy & Precautions, NPO status , Patient's Chart, lab work & pertinent test results  History of Anesthesia Complications (+) history of anesthetic complications (reports very sensitive to drugs)  Airway Mallampati: I  TM Distance: >3 FB Neck ROM: Full   Comment: Previous grade I view with Miller 2, easy mask Dental  (+) Dental Advisory Given   Pulmonary neg shortness of breath, sleep apnea , neg COPD, neg recent URI   Pulmonary exam normal breath sounds clear to auscultation       Cardiovascular hypertension (telmisartan), Pt. on medications (-) angina (-) Past MI, (-) Cardiac Stents and (-) CABG (-) dysrhythmias  Rhythm:Regular Rate:Normal  HLD   Neuro/Psych neg Seizures Meniere's disease  Neuromuscular disease (lumbar stenosis)    GI/Hepatic negative GI ROS, Neg liver ROS,,,  Endo/Other  negative endocrine ROS    Renal/GU negative Renal ROS     Musculoskeletal  (+) Arthritis , Osteoarthritis,    Abdominal   Peds  Hematology negative hematology ROS (+)   Anesthesia Other Findings   Reproductive/Obstetrics                             Anesthesia Physical Anesthesia Plan  ASA: 2  Anesthesia Plan: General and Regional   Post-op Pain Management: Regional block* and Tylenol PO (pre-op)*   Induction: Intravenous  PONV Risk Score and Plan: 3 and Ondansetron, Treatment may vary due to age or medical condition and Dexamethasone  Airway Management Planned: Oral ETT  Additional Equipment:   Intra-op Plan:   Post-operative Plan: Extubation in OR  Informed Consent: I have reviewed the patients History and Physical, chart, labs and discussed the procedure including the risks, benefits and alternatives for the proposed anesthesia with the patient or authorized representative who has  indicated his/her understanding and acceptance.     Dental advisory given  Plan Discussed with: CRNA and Anesthesiologist  Anesthesia Plan Comments: (Discussed potential risks of nerve blocks including, but not limited to, infection, bleeding, nerve damage, seizures, pneumothorax, respiratory depression, and potential failure of the block. Alternatives to nerve blocks discussed. All questions answered.  Risks of general anesthesia discussed including, but not limited to, sore throat, hoarse voice, chipped/damaged teeth, injury to vocal cords, nausea and vomiting, allergic reactions, lung infection, heart attack, stroke, and death. All questions answered. )        Anesthesia Quick Evaluation

## 2022-12-17 NOTE — Discharge Instructions (Signed)
Ophelia Charter MD, MPH Noemi Chapel, PA-C [facility] [address], [address] [phone] (tel)   [phone] (fax)   [place] may leave the operative dressing in place until your follow-up appointment. KEEP THE INCISIONS CLEAN AND DRY. There may be a small amount of fluid/bleeding leaking at the surgical site. This is normal after surgery.  If it fills with liquid or blood please call us immediately to change it for you. Use the provided ice machine or Ice packs as often as possible for the first 3-4 days, then as needed for pain relief.   Keep a layer of cloth or a shirt between your skin and the cooling unit to prevent frost bite as it can get very cold.  SHOWERING: - You may shower on Post-Op Day #2.  - The dressing is water resistant but do not scrub it as it may start to peel up.   - You may remove the sling for showering - Gently pat the area dry.  - Do not soak the shoulder in water.  - Do not go swimming in the pool or ocean until your incision has completely healed (about 4-6 weeks after surgery) - KEEP THE INCISIONS CLEAN AND DRY.  EXERCISES Wear the sling at all times  You may remove the sling for showering, but keep the arm across the chest or in a secondary sling.    Accidental/Purposeful External Rotation and shoulder flexion (reaching behind you) is to be avoided at all costs for the first month. It is ok to come out of your sling if your are sitting and have assistance for eating.   Do not lift anything heavier than 1 pound until we discuss it further in clinic.  It is normal for your fingers/hand to become more swollen after surgery and discolored from bruising.   This will resolve over the first few weeks usually after surgery. Please continue to ambulate and do not stay sitting or lying for too long.  Perform foot and wrist pumps to assist in circulation.  PHYSICAL  THERAPY - You will begin physical therapy soon after surgery (unless otherwise specified) - Please call to set up an appointment, if you do not already have one  - Let our office if there are any issues with scheduling your therapy    - A PT referral was sent to [place] - Please call their office to schedule post-op physical therapy if you have not already done so  REGIONAL ANESTHESIA (NERVE BLOCKS) The anesthesia team may have performed a nerve block for you this is a great tool used to minimize pain.   The block may start wearing off overnight (between 8-24 hours postop) When the block wears off, your pain may go from nearly zero to the pain you would have had postop without the block. This is an abrupt transition but nothing dangerous is happening.   This can be a challenging period but utilize your as needed pain medications to try and manage this period. We suggest you use the pain medication the first night prior to going to bed, to ease this transition.  You may take an extra dose of narcotic when this happens if needed   POST-OP MEDICATIONS- Multimodal approach to pain control In general your pain will be controlled with a combination of substances.  Prescriptions unless otherwise discussed are electronically sent to your pharmacy.  This is a carefully  made plan we use to minimize narcotic use.     Celebrex - Anti-inflammatory medication taken on a scheduled basis Acetaminophen - Non-narcotic pain medicine taken on a scheduled basis  Oxycodone - This is a strong narcotic, to be used only on an "as needed" basis for SEVERE pain. Aspirin '81mg'$  - This medicine is used to minimize the risk of blood clots after surgery. Zofran -  take as needed for nausea  Medications were sent to [facility] on February 12  FOLLOW-UP If you develop a Fever (>101.5), Redness or Drainage from the surgical incision site, please call our office to arrange for an  evaluation. Please call the office to schedule a follow-up appointment for a wound check, 7-10 days post-operatively.  IF YOU HAVE ANY QUESTIONS, PLEASE FEEL FREE TO CALL OUR OFFICE.  HELPFUL INFORMATION  Your arm will be in a sling following surgery. You will be in this sling for the next 4 weeks.   You may be more comfortable sleeping in a semi-seated position the first few nights following surgery.  Keep a pillow propped under the elbow and forearm for comfort.  If you have a recliner type of chair it might be beneficial.  If not that is fine too, but it would be helpful to sleep propped up with pillows behind your operated shoulder as well under your elbow and forearm.  This will reduce pulling on the suture lines.  When dressing, put your operative arm in the sleeve first.  When getting undressed, take your operative arm out last.  Loose fitting, button-down shirts are recommended.  In most states it is against the law to drive while your arm is in a sling. And certainly against the law to drive while taking narcotics.  You may return to work/school in the next couple of days when you feel up to it. Desk work and typing in the sling is fine.  We suggest you use the pain medication the first night prior to going to bed, in order to ease any pain when the anesthesia wears off. You should avoid taking pain medications on an empty stomach as it will make you nauseous.  You should wean off your narcotic medicines as soon as you are able.     Most patients will be off or using minimal narcotics before their first postop appointment.   Do not drink alcoholic beverages or take illicit drugs when taking pain medications.  Pain medication may make you constipated.  Below are a few solutions to try in this order: Decrease the amount of pain medication if you aren't having pain. Drink lots of decaffeinated fluids. Drink prune juice and/or each dried prunes  If the first 3 don't work start with  additional solutions Take Colace - an over-the-counter stool softener Take Senokot - an over-the-counter laxative Take Miralax - a stronger over-the-counter laxative   Dental Antibiotics:  In most cases prophylactic antibiotics for Dental procdeures after total joint surgery are not necessary.  Exceptions are as follows:  1. History of prior total joint infection  2. Severely immunocompromised (Organ Transplant, cancer chemotherapy, Rheumatoid biologic meds such as [place])  3. Poorly controlled diabetes (A1C &gt; 8.0, blood glucose over 200)  If you have one of these conditions, contact your surgeon for an antibiotic prescription, prior to your dental procedure.   For more information including helpful videos and documents visit our website:   [email or web address]     You may have Tylenol again after 1pm today, if  needed.   Post Anesthesia Home Care Instructions  Activity: Get plenty of rest for the remainder of the day. A responsible individual must stay with you for 24 hours following the procedure.  For the next 24 hours, DO NOT: -Drive a car -Paediatric nurse -Drink alcoholic beverages -Take any medication unless instructed by your physician -Make any legal decisions or sign important papers.  Meals: Start with liquid foods such as gelatin or soup. Progress to regular foods as tolerated. Avoid greasy, spicy, heavy foods. If nausea and/or vomiting occur, drink only clear liquids until the nausea and/or vomiting subsides. Call your physician if vomiting continues.  Special Instructions/Symptoms: Your throat may feel dry or sore from the anesthesia or the breathing tube placed in your throat during surgery. If this causes discomfort, gargle with warm salt water. The discomfort should disappear within 24 hours.  If you had a scopolamine patch placed behind your ear for the management of post- operative nausea and/or vomiting:  1.  The medication in the patch is effective for 72 hours, after which it should be removed.  Wrap patch in a tissue and discard in the trash. Wash hands thoroughly with soap and water. 2. You may remove the patch earlier than 72 hours if you experience unpleasant side effects which may include dry mouth, dizziness or visual disturbances. 3. Avoid touching the patch. Wash your hands with soap and water after contact with the patch.   Regional Anesthesia Blocks  1. Numbness or the inability to move the "blocked" extremity may last from 3-48 hours after placement. The length of time depends on the medication injected and your individual response to the medication. If the numbness is not going away after 48 hours, call your surgeon.  2. The extremity that is blocked will need to be protected until the numbness is gone and the  Strength has returned. Because you cannot feel it, you will need to take extra care to avoid injury. Because it may be weak, you may have difficulty moving it or using it. You may not know what position it is in without looking at it while the block is in effect.  3. For blocks in the legs and feet, returning to weight bearing and walking needs to be done carefully. You will need to wait until the numbness is entirely gone and the strength has returned. You should be able to move your leg and foot normally before you try and bear weight or walk. You will need someone to be with you when you first try to ensure you do not fall and possibly risk injury.  4. Bruising and tenderness at the needle site are common side effects and will resolve in a few days.  5. Persistent numbness or new problems with movement should be communicated to the surgeon or the [facility] [phone] [place] [phone]).

## 2022-12-17 NOTE — Telephone Encounter (Signed)
[facility] 

## 2022-12-17 NOTE — Telephone Encounter (Signed)
Noted  

## 2022-12-18 ENCOUNTER — Encounter: Payer: 59 | Admitting: Physical Therapy

## 2022-12-18 ENCOUNTER — Ambulatory Visit (HOSPITAL_COMMUNITY): Payer: 59

## 2022-12-18 ENCOUNTER — Ambulatory Visit (HOSPITAL_BASED_OUTPATIENT_CLINIC_OR_DEPARTMENT_OTHER): Payer: 59 | Admitting: Certified Registered"

## 2022-12-18 ENCOUNTER — Other Ambulatory Visit: Payer: Self-pay

## 2022-12-18 ENCOUNTER — Ambulatory Visit (HOSPITAL_BASED_OUTPATIENT_CLINIC_OR_DEPARTMENT_OTHER)
Admission: RE | Admit: 2022-12-18 | Discharge: 2022-12-18 | Disposition: A | Discharge status: Home / Self Care | Payer: 59 | Attending: Orthopaedic Surgery | Admitting: Orthopaedic Surgery

## 2022-12-18 ENCOUNTER — Encounter (HOSPITAL_BASED_OUTPATIENT_CLINIC_OR_DEPARTMENT_OTHER): Payer: Self-pay | Admitting: Orthopaedic Surgery

## 2022-12-18 ENCOUNTER — Encounter (HOSPITAL_BASED_OUTPATIENT_CLINIC_OR_DEPARTMENT_OTHER)
Admission: RE | Disposition: A | Discharge status: Home / Self Care | Payer: Self-pay | Source: Home / Self Care | Attending: Orthopaedic Surgery

## 2022-12-18 DIAGNOSIS — M19012 Primary osteoarthritis, left shoulder: Secondary | ICD-10-CM

## 2022-12-18 DIAGNOSIS — G4730 Sleep apnea, unspecified: Secondary | ICD-10-CM | POA: Insufficient documentation

## 2022-12-18 DIAGNOSIS — I10 Essential (primary) hypertension: Secondary | ICD-10-CM | POA: Diagnosis not present

## 2022-12-18 DIAGNOSIS — M25712 Osteophyte, left shoulder: Secondary | ICD-10-CM | POA: Insufficient documentation

## 2022-12-18 DIAGNOSIS — G8918 Other acute postprocedural pain: Secondary | ICD-10-CM | POA: Diagnosis not present

## 2022-12-18 DIAGNOSIS — Z471 Aftercare following joint replacement surgery: Secondary | ICD-10-CM | POA: Diagnosis not present

## 2022-12-18 DIAGNOSIS — H8109 Meniere's disease, unspecified ear: Secondary | ICD-10-CM | POA: Diagnosis not present

## 2022-12-18 DIAGNOSIS — Z96612 Presence of left artificial shoulder joint: Secondary | ICD-10-CM | POA: Diagnosis not present

## 2022-12-18 DIAGNOSIS — M48061 Spinal stenosis, lumbar region without neurogenic claudication: Secondary | ICD-10-CM | POA: Insufficient documentation

## 2022-12-18 HISTORY — PX: TOTAL SHOULDER ARTHROPLASTY: SHX126

## 2022-12-18 SURGERY — ARTHROPLASTY, SHOULDER, TOTAL
Anesthesia: Regional | Site: Shoulder | Laterality: Left

## 2022-12-18 MED ORDER — FENTANYL CITRATE (PF) 100 MCG/2ML IJ SOLN
INTRAMUSCULAR | Status: DC | PRN
  Administered 2022-12-18: 50 ug via INTRAVENOUS

## 2022-12-18 MED ORDER — EPINEPHRINE PF 1 MG/ML IJ SOLN
INTRAMUSCULAR | Status: AC
  Filled 2022-12-18: qty 4

## 2022-12-18 MED ORDER — LIDOCAINE 2% (20 MG/ML) 5 ML SYRINGE
INTRAMUSCULAR | Status: AC
  Filled 2022-12-18: qty 5

## 2022-12-18 MED ORDER — DEXAMETHASONE SODIUM PHOSPHATE 10 MG/ML IJ SOLN
INTRAMUSCULAR | Status: DC | PRN
  Administered 2022-12-18: 5 mg via INTRAVENOUS

## 2022-12-18 MED ORDER — MIDAZOLAM HCL 2 MG/2ML IJ SOLN
INTRAMUSCULAR | Status: AC
  Filled 2022-12-18: qty 2

## 2022-12-18 MED ORDER — VANCOMYCIN HCL 1000 MG IV SOLR
INTRAVENOUS | Status: DC | PRN
  Administered 2022-12-18: 1000 mg via TOPICAL

## 2022-12-18 MED ORDER — PHENYLEPHRINE HCL (PRESSORS) 10 MG/ML IV SOLN
INTRAVENOUS | Status: AC
  Filled 2022-12-18: qty 1

## 2022-12-18 MED ORDER — FENTANYL CITRATE (PF) 100 MCG/2ML IJ SOLN
INTRAMUSCULAR | Status: AC
  Filled 2022-12-18: qty 2

## 2022-12-18 MED ORDER — ROCURONIUM BROMIDE 10 MG/ML (PF) SYRINGE
PREFILLED_SYRINGE | INTRAVENOUS | Status: AC
  Filled 2022-12-18: qty 10

## 2022-12-18 MED ORDER — CEFAZOLIN SODIUM-DEXTROSE 2-4 GM/100ML-% IV SOLN
2.0000 g | INTRAVENOUS | Status: AC
  Administered 2022-12-18: 2 g via INTRAVENOUS

## 2022-12-18 MED ORDER — ONDANSETRON HCL 4 MG/2ML IJ SOLN
INTRAMUSCULAR | Status: AC
  Filled 2022-12-18: qty 2

## 2022-12-18 MED ORDER — TRANEXAMIC ACID-NACL 1000-0.7 MG/100ML-% IV SOLN
1000.0000 mg | INTRAVENOUS | Status: AC
  Administered 2022-12-18: 1000 mg via INTRAVENOUS

## 2022-12-18 MED ORDER — PHENYLEPHRINE HCL-NACL 20-0.9 MG/250ML-% IV SOLN
INTRAVENOUS | Status: DC | PRN
  Administered 2022-12-18: 25 ug/min via INTRAVENOUS

## 2022-12-18 MED ORDER — LIDOCAINE 2% (20 MG/ML) 5 ML SYRINGE
INTRAMUSCULAR | Status: DC | PRN
  Administered 2022-12-18: 40 mg via INTRAVENOUS

## 2022-12-18 MED ORDER — ACETAMINOPHEN 500 MG PO TABS
ORAL_TABLET | ORAL | Status: AC
  Filled 2022-12-18: qty 2

## 2022-12-18 MED ORDER — LACTATED RINGERS IV SOLN: INTRAVENOUS | Status: DC

## 2022-12-18 MED ORDER — ATROPINE SULFATE 0.4 MG/ML IV SOLN
INTRAVENOUS | Status: AC
  Filled 2022-12-18: qty 1

## 2022-12-18 MED ORDER — GABAPENTIN 300 MG PO CAPS
ORAL_CAPSULE | ORAL | Status: AC
  Filled 2022-12-18: qty 1

## 2022-12-18 MED ORDER — SUGAMMADEX SODIUM 200 MG/2ML IV SOLN
INTRAVENOUS | Status: DC | PRN
  Administered 2022-12-18: 120 mg via INTRAVENOUS

## 2022-12-18 MED ORDER — ACETAMINOPHEN 500 MG PO TABS: 1000.0000 mg | ORAL_TABLET | Freq: Once | ORAL | Status: DC

## 2022-12-18 MED ORDER — BUPIVACAINE HCL (PF) 0.5 % IJ SOLN
INTRAMUSCULAR | Status: DC | PRN
  Administered 2022-12-18: 10 mL via PERINEURAL

## 2022-12-18 MED ORDER — BUPIVACAINE LIPOSOME 1.3 % IJ SUSP
INTRAMUSCULAR | Status: DC | PRN
  Administered 2022-12-18: 10 mL via PERINEURAL

## 2022-12-18 MED ORDER — ROCURONIUM BROMIDE 100 MG/10ML IV SOLN
INTRAVENOUS | Status: DC | PRN
  Administered 2022-12-18: 30 mg via INTRAVENOUS

## 2022-12-18 MED ORDER — PROPOFOL 10 MG/ML IV BOLUS
INTRAVENOUS | Status: DC | PRN
  Administered 2022-12-18: 100 mg via INTRAVENOUS

## 2022-12-18 MED ORDER — VANCOMYCIN HCL 1000 MG IV SOLR
INTRAVENOUS | Status: AC
  Filled 2022-12-18: qty 40

## 2022-12-18 MED ORDER — PROPOFOL 10 MG/ML IV BOLUS
INTRAVENOUS | Status: AC
  Filled 2022-12-18: qty 20

## 2022-12-18 MED ORDER — TRANEXAMIC ACID-NACL 1000-0.7 MG/100ML-% IV SOLN
INTRAVENOUS | Status: AC
  Filled 2022-12-18: qty 100

## 2022-12-18 MED ORDER — FENTANYL CITRATE (PF) 100 MCG/2ML IJ SOLN: 25.0000 ug | INTRAMUSCULAR | Status: DC | PRN

## 2022-12-18 MED ORDER — ONDANSETRON HCL 4 MG/2ML IJ SOLN
INTRAMUSCULAR | Status: DC | PRN
  Administered 2022-12-18: 4 mg via INTRAVENOUS

## 2022-12-18 MED ORDER — OXYCODONE HCL 5 MG PO TABS: 5.0000 mg | ORAL_TABLET | Freq: Once | ORAL | Status: DC | PRN

## 2022-12-18 MED ORDER — AMISULPRIDE (ANTIEMETIC) 5 MG/2ML IV SOLN
10.0000 mg | Freq: Once | INTRAVENOUS | Status: AC | PRN
  Administered 2022-12-18: 10 mg via INTRAVENOUS

## 2022-12-18 MED ORDER — CEFAZOLIN SODIUM-DEXTROSE 2-4 GM/100ML-% IV SOLN
INTRAVENOUS | Status: AC
  Filled 2022-12-18: qty 100

## 2022-12-18 MED ORDER — GABAPENTIN 300 MG PO CAPS
300.0000 mg | ORAL_CAPSULE | Freq: Once | ORAL | Status: AC
  Administered 2022-12-18: 300 mg via ORAL

## 2022-12-18 MED ORDER — MIDAZOLAM HCL 2 MG/2ML IJ SOLN
1.0000 mg | Freq: Once | INTRAMUSCULAR | Status: AC
  Administered 2022-12-18: 1 mg via INTRAVENOUS

## 2022-12-18 MED ORDER — ACETAMINOPHEN 500 MG PO TABS
500.0000 mg | ORAL_TABLET | Freq: Once | ORAL | Status: AC
  Administered 2022-12-18: 500 mg via ORAL

## 2022-12-18 MED ORDER — PHENYLEPHRINE 80 MCG/ML (10ML) SYRINGE FOR IV PUSH (FOR BLOOD PRESSURE SUPPORT)
PREFILLED_SYRINGE | INTRAVENOUS | Status: AC
  Filled 2022-12-18: qty 10

## 2022-12-18 MED ORDER — AMISULPRIDE (ANTIEMETIC) 5 MG/2ML IV SOLN
INTRAVENOUS | Status: AC
  Filled 2022-12-18: qty 4

## 2022-12-18 MED ORDER — DEXAMETHASONE SODIUM PHOSPHATE 10 MG/ML IJ SOLN
INTRAMUSCULAR | Status: AC
  Filled 2022-12-18: qty 1

## 2022-12-18 MED ORDER — PHENYLEPHRINE HCL (PRESSORS) 10 MG/ML IV SOLN
INTRAVENOUS | Status: DC | PRN
  Administered 2022-12-18 (×2): 160 ug via INTRAVENOUS

## 2022-12-18 MED ORDER — SODIUM CHLORIDE 0.9 % IR SOLN
Status: DC | PRN
  Administered 2022-12-18: 1000 mL

## 2022-12-18 MED ORDER — OXYCODONE HCL 5 MG/5ML PO SOLN: 5.0000 mg | Freq: Once | ORAL | Status: DC | PRN

## 2022-12-18 SURGICAL SUPPLY — 68 items
AID PSTN UNV HD RSTRNT DISP (MISCELLANEOUS) ×1
APL PRP STRL LF DISP 70% ISPRP (MISCELLANEOUS) ×1
BLADE SAW SGTL 73X25 THK (BLADE) ×1 IMPLANT
BLADE SURG 10 STRL SS (BLADE) IMPLANT
BLADE SURG 15 STRL LF DISP TIS (BLADE) IMPLANT
BLADE SURG 15 STRL SS (BLADE)
CEMENT BONE DEPUY (Cement) ×1 IMPLANT
CHLORAPREP W/TINT 26 (MISCELLANEOUS) ×1 IMPLANT
CLSR STERI-STRIP ANTIMIC 1/2X4 (GAUZE/BANDAGES/DRESSINGS) ×1 IMPLANT
COMP GLENOID LT CORTILOC S 25D (Joint) ×1 IMPLANT
COMPONENT GLENDLT CORTLC S 25D (Joint) IMPLANT
COOLER ICEMAN CLASSIC (MISCELLANEOUS) ×1 IMPLANT
COVER BACK TABLE 60X90IN (DRAPES) ×1 IMPLANT
COVER MAYO STAND STRL (DRAPES) ×1 IMPLANT
DRAPE IMP U-DRAPE 54X76 (DRAPES) IMPLANT
DRAPE INCISE IOBAN 66X45 STRL (DRAPES) ×1 IMPLANT
DRAPE POUCH INSTRU U-SHP 10X18 (DRAPES) ×1 IMPLANT
DRAPE U-SHAPE 76X120 STRL (DRAPES) ×2 IMPLANT
DRSG AQUACEL AG ADV 3.5X 6 (GAUZE/BANDAGES/DRESSINGS) ×1 IMPLANT
ELECT BLADE 4.0 EZ CLEAN MEGAD (MISCELLANEOUS) ×1
ELECT REM PT RETURN 9FT ADLT (ELECTROSURGICAL) ×1
ELECTRODE BLDE 4.0 EZ CLN MEGD (MISCELLANEOUS) ×1 IMPLANT
ELECTRODE REM PT RTRN 9FT ADLT (ELECTROSURGICAL) ×1 IMPLANT
FACESHIELD WRAPAROUND (MASK) ×2 IMPLANT
FACESHIELD WRAPAROUND OR TEAM (MASK) ×2 IMPLANT
GAUZE XEROFORM 1X8 LF (GAUZE/BANDAGES/DRESSINGS) IMPLANT
GLOVE BIO SURGEON STRL SZ 6.5 (GLOVE) ×2 IMPLANT
GLOVE BIOGEL PI IND STRL 6.5 (GLOVE) ×1 IMPLANT
GLOVE BIOGEL PI IND STRL 8 (GLOVE) ×1 IMPLANT
GLOVE ECLIPSE 8.0 STRL XLNG CF (GLOVE) ×3 IMPLANT
GOWN STRL REUS W/ TWL LRG LVL3 (GOWN DISPOSABLE) ×2 IMPLANT
GOWN STRL REUS W/TWL LRG LVL3 (GOWN DISPOSABLE) ×3
GOWN STRL REUS W/TWL XL LVL3 (GOWN DISPOSABLE) ×1 IMPLANT
GUIDE PIN 3X75 SHOULDER (PIN) ×1
GUIDEWIRE GLENOID 2.5X220 (WIRE) ×1 IMPLANT
HANDPIECE INTERPULSE COAX TIP (DISPOSABLE) ×1
HEAD HUMERAL 44X18 (Miscellaneous) IMPLANT
KIT STABILIZATION SHOULDER (MISCELLANEOUS) ×1 IMPLANT
MANIFOLD NEPTUNE II (INSTRUMENTS) ×1 IMPLANT
NDL MAYO TROCAR (NEEDLE) ×1 IMPLANT
NEEDLE MAYO TROCAR (NEEDLE) ×1 IMPLANT
NS IRRIG 1000ML POUR BTL (IV SOLUTION) ×1 IMPLANT
NUCLEUS SHOULDER SZ 2 (Miscellaneous) IMPLANT
NUCLEUS SZ 2 (Miscellaneous) ×1 IMPLANT
PACK BASIN DAY SURGERY FS (CUSTOM PROCEDURE TRAY) ×1 IMPLANT
PACK SHOULDER (CUSTOM PROCEDURE TRAY) ×1 IMPLANT
PAD COLD SHLDR WRAP-ON (PAD) ×1 IMPLANT
PENCIL SMOKE EVACUATOR (MISCELLANEOUS) IMPLANT
PIN GUIDE 3X75 SHOULDER (PIN) ×1 IMPLANT
RESTRAINT HEAD UNIVERSAL NS (MISCELLANEOUS) ×1 IMPLANT
SET HNDPC FAN SPRY TIP SCT (DISPOSABLE) ×1 IMPLANT
SHEET MEDIUM DRAPE 40X70 STRL (DRAPES) ×1 IMPLANT
SLEEVE SCD COMPRESS KNEE MED (STOCKING) ×1 IMPLANT
SMARTMIX MINI TOWER (MISCELLANEOUS) ×1
SPONGE T-LAP 18X18 ~~LOC~~+RFID (SPONGE) IMPLANT
SUT ETHIBOND 2 V 37 (SUTURE) ×1 IMPLANT
SUT ETHIBOND NAB CT1 #1 30IN (SUTURE) ×1 IMPLANT
SUT ETHILON 3 0 PS 1 (SUTURE) IMPLANT
SUT FIBERWIRE #2 38 REV NDL BL (SUTURE)
SUT FIBERWIRE #5 38 CONV NDL (SUTURE) ×6
SUT MNCRL AB 4-0 PS2 18 (SUTURE) ×1 IMPLANT
SUT VIC AB 3-0 SH 27 (SUTURE) ×1
SUT VIC AB 3-0 SH 27X BRD (SUTURE) ×1 IMPLANT
SUTURE FIBERWR #5 38 CONV NDL (SUTURE) ×6 IMPLANT
SUTURE FIBERWR#2 38 REV NDL BL (SUTURE) IMPLANT
TOWEL GREEN STERILE FF (TOWEL DISPOSABLE) ×3 IMPLANT
TOWER SMARTMIX MINI (MISCELLANEOUS) ×1 IMPLANT
TUBE SUCTION HIGH CAP CLEAR NV (SUCTIONS) ×1 IMPLANT

## 2022-12-18 NOTE — Interval H&P Note (Signed)
All questions answered, patient wants to proceed with procedure.  

## 2022-12-18 NOTE — Transfer of Care (Signed)
Immediate Anesthesia Transfer of Care Note  Patient: Sara Perry  Procedure(s) Performed: TOTAL SHOULDER ARTHROPLASTY (Left: Shoulder)  Patient Location: PACU  Anesthesia Type:GA combined with regional for post-op pain  Level of Consciousness: drowsy  Airway & Oxygen Therapy: Patient Spontanous Breathing and Patient connected to face mask oxygen  Post-op Assessment: Report given to RN and Post -op Vital signs reviewed and stable  Post vital signs: Reviewed and stable  Last Vitals:  Vitals Value Taken Time  BP 121/89 12/18/22 0915  Temp    Pulse 83 12/18/22 0916  Resp 12 12/18/22 0916  SpO2 100 % 12/18/22 0916  Vitals shown include unvalidated device data.  Last Pain:  Vitals:   12/18/22 0646  TempSrc: Oral  PainSc: 1          Complications: No notable events documented.

## 2022-12-18 NOTE — Anesthesia Procedure Notes (Signed)
Anesthesia Regional Block: Interscalene brachial plexus block   Pre-Anesthetic Checklist: , timeout performed,  Correct Patient, Correct Site, Correct Laterality,  Correct Procedure, Correct Position, site marked,  Risks and benefits discussed,  Surgical consent,  Pre-op evaluation,  At surgeon's request and post-op pain management  Laterality: Left  Prep: chloraprep       Needles:  Injection technique: Single-shot  Needle Type: Echogenic Stimulator Needle     Needle Length: 9cm  Needle Gauge: 21     Additional Needles:   Procedures:,,,, ultrasound used (permanent image in chart),,    Narrative:  Start time: 12/18/2022 7:09 AM End time: 12/18/2022 7:11 AM Injection made incrementally with aspirations every 5 mL.  Performed by: Personally  Anesthesiologist: Nilda Simmer, MD  Additional Notes: Discussed risks and benefits of nerve block including, but not limited to, prolonged and/or permanent nerve injury involving sensory and/or motor function. Monitors were applied and a time-out was performed. The nerve and associated structures were visualized under ultrasound guidance. After negative aspiration, local anesthetic was slowly injected around the nerve. There was no evidence of high pressure during the procedure. There were no paresthesias. VSS remained stable and the patient tolerated the procedure well.

## 2022-12-18 NOTE — Anesthesia Postprocedure Evaluation (Signed)
Anesthesia Post Note  Patient: Sara Perry  Procedure(s) Performed: TOTAL SHOULDER ARTHROPLASTY (Left: Shoulder)     Patient location during evaluation: PACU Anesthesia Type: Regional and General Level of consciousness: awake Pain management: pain level controlled Vital Signs Assessment: post-procedure vital signs reviewed and stable Respiratory status: spontaneous breathing, nonlabored ventilation and respiratory function stable Cardiovascular status: blood pressure returned to baseline and stable Postop Assessment: no apparent nausea or vomiting Anesthetic complications: no   No notable events documented.  Last Vitals:  Vitals:   12/18/22 0931 12/18/22 0946  BP:  121/78  Pulse: 75 72  Resp: 20 10  Temp:    SpO2: 100% 97%    Last Pain:  Vitals:   12/18/22 0946  TempSrc:   PainSc: 3                  Nilda Simmer

## 2022-12-18 NOTE — Op Note (Signed)
Orthopaedic Surgery Operative Note (CSN: [id number])  Sara Perry  [birth year] Date of Surgery: 12/18/2022   Diagnoses:  Left end-stage arthritis shoulder  Procedure: Left anatomic augmented total Shoulder Arthroplasty   Operative Finding Successful completion of planned procedure.  Patient had significant posterior erosion of the glenoid.  We used a 25 full wedge implant to reconstruct the joint.  Overall good stability and a great repair of the subscapularis.  Post-operative plan: The patient will be NWB in sling.  The patient will be will be discharged from PACU if continues to be stable as was plan prior to surgery.  DVT prophylaxis Aspirin 81 mg twice daily for 6 weeks.  Pain control with PRN pain medication preferring oral medicines.  Follow up plan will be scheduled in approximately 7 days for incision check and XR.  Physical therapy to start immediately.  Implants: Tornier simplicity size 2 nucleus, 44 x 18 soft tissue balancing head, 25 performed plus glenoid small  Post-Op Diagnosis: Same Surgeons:Primary: Hiram Gash, MD Assistants:Caroline McBane PA-C Location: [facility] OR ROOM 1 Anesthesia: General with Exparel Interscalene Antibiotics: Ancef 2g preop, Vancomycin '1000mg'$  locally Tourniquet time: None Estimated Blood Loss: 123XX123 Complications: None Specimens: None Implants: Implant Name Type Inv. Item Serial No. Manufacturer Lot No. LRB No. Used Action  CEMENT BONE DEPUY - [id number] Cement CEMENT BONE DEPUY  DEPUY ORTHOPAEDICS [id number] Left 1 Implanted  COMP GLENOID LT CORTILOC S 25D - [id number] Joint COMP GLENOID LT CORTILOC S 25D [id number] [employer]  Left 1 Implanted  NUCLEUS SZ 2 - [id number] Miscellaneous NUCLEUS SZ 2 [id number] [employer]  Left 1 Implanted  stb humeral head Orthopedic Implant  [id number]   Left 1 Implanted    Indications for Surgery:   Sara Perry is a 62 y.o. female with end-stage arthritis.  Benefits and risks of  operative and nonoperative management were discussed prior to surgery with patient/guardian(s) and informed consent form was completed.  Infection and need for further surgery were discussed as was prosthetic stability and cuff issues.  We additionally specifically discussed risks of axillary nerve injury, infection, periprosthetic fracture, continued pain and longevity of implants prior to beginning procedure.      Procedure:   The patient was identified in the preoperative holding area where the surgical site was marked. Block placed by anesthesia with exparel.  The patient was taken to the OR where a procedural timeout was called and the above noted anesthesia was induced.  The patient was positioned beachchair on allen table with spider arm positioner.  Preoperative antibiotics were dosed.  The patient's left shoulder was prepped and draped in the usual sterile fashion.  A second preoperative timeout was called.       Standard deltopectoral approach was performed with a #10 blade. We dissected down to the subcutaneous tissues and the cephalic vein was taken laterally with the deltoid. Clavipectoral fascia was incised in line with the incision. Deep retractors were placed. The long of the biceps tendon was identified and there was significant tenosynovitis present.  Tenodesis was performed to the pectoralis tendon with #2 Ethibond. The remaining biceps was followed up into the rotator interval where it was released. The subscapularis was taken down with a lesser tuberosity osteotomy using an osteotome. The osteotomy fragment and underlying capsular elevated off of the humeral neck and the osteophytes inferiorly. #2 Ethibond sutures are passed through the bone tendon junction for subscap manipulation. We continued releasing the capsule directly off of the osteophytes inferiorly all the  way around the corner. This allowed [place] to dislocate the humeral head. The humeral head had evidence of severe  osteoarthritic wear with full-thickness cartilage loss and exposed subchondral bone. There was significant flattening of the humeral head.   The rotator cuff was carefully examined and noted to be intact without sign of wear.  The decision was confirmed that an anatomic total shoulder was indicated for this patient.  There were osteophytes along the inferior humeral neck. The osteophytes were removed with an osteotome and a rongeur.  Osteophytes were removed with a rongeur and an osteotome and the anatomic neck was well visualized.   We next made our humeral osteotomy with an oscillating saw along the anatomic neck. The head fragment was passed off the back table and measured approximately 44 mm in diameter.   Bone quality was reasonable and we decided that it was appropriate to use simpliciti stemless implants and placed a guidepin perpendicular to our cut using a guide.  This obtained bicortical purchase.  We then reamed off of this to a flat surface and drilled and placed our nucleus.  A cut protector was placed.  The subscapularis was again identified and immediately we took care to palpate the axillary nerve anteriorly and verify its position with gentle palpation as well as the tug test.  We then released the SGHL with bovie cautery prior to placing a curved mayo at the junction of the anterior glenoid well above the axillary nerve and bluntly dissecting the subscapularis from the capsule.  We then carefully protected the axillary nerve as we gently released the inferior capsule to fully mobilize the subscapularis.  An anterior deltoid retractor was then placed as well as a small Hohmann retractor superiorly.    The glenoid was inspected and had evidence of severe osteoarthritic wear with full-thickness cartilage loss and exposed subchondral bone. The remaining labrum was removed circumferentially taking great care not to disrupt the posterior capsule.   Based on the patient's preoperative plan  through blueprint we are able to identify significant posterior erosion.  We noted that a augmented wedge appropriately reconstructed this deficit.  We reamed the native glenoid past the center pin location based on our plan.  We then used the augmented reamer to ream the posterior aspect.  We drilled our 3 peripheral screws in our center peg hole.  We irrigated and then placed our trial implants which fit well.  The glenoid bone was prepared with pulsatile lavage and a sponge to dry the bone prior to cement application. Cement was mixed on the back table the peripheral peg holes were cemented. The glenoid was impacted securely.   We turned attention back to the humeral side. The cut protector was removed. We trialed with multiple size head options and selected a 44x18 which re-created the patient's anatomy. The offset was dialed in to match the normal anatomy. The shoulder was trialed.  There was good ROM in all planes and the shoulder was stable with approximately 50% posterior spring back and no inferior translation.  The real humeral implants were opened on the back table and assembled.  The trial was removed. #5 Fiberwire sutures passed through the humeral neck for subscap repair. The humeral component was press-fit obtaining a secure fit.  The joint was reduced and thoroughly irrigated with pulsatile lavage. Subscap and lesser tuberosity osteotomy repaired back in a double row fashion with #5 Fiberwire sutures through bone tunnels. Next the rotator interval was closed with #2 ethibond suture. Hemostasis was  obtained. The deltopectoral interval was reapproximated with #1 Ethibond. The subcutaneous tissues were closed with 2-0 Vicryl and the skin was closed with a running monocryl. The incisions were cleaned and dried and an Aquacel dressing was placed. The drapes taken down. The arm was placed into sling with abduction pillow. Patient was awakened, extubated, and transferred to the recovery room in stable  condition. There were no intraoperative complications. The sponge, needle, and attention counts were correct at the end of the case.      Noemi Chapel, PA-C, present and scrubbed throughout the case, critical for completion in a timely fashion, and for retraction, instrumentation, closure.

## 2022-12-18 NOTE — Progress Notes (Signed)
Assisted Dr. Neoma Laming with left, interscalene , ultrasound guided block. Side rails up, monitors on throughout procedure. See vital signs in flow sheet. Tolerated Procedure well.

## 2022-12-18 NOTE — Anesthesia Procedure Notes (Signed)
Procedure Name: Intubation Date/Time: 12/18/2022 7:37 AM  Performed by: Lavonia Dana, CRNAPre-anesthesia Checklist: Patient identified, Emergency Drugs available, Suction available and Patient being monitored Patient Re-evaluated:Patient Re-evaluated prior to induction Oxygen Delivery Method: Circle system utilized Preoxygenation: Pre-oxygenation with 100% oxygen Induction Type: IV induction Ventilation: Mask ventilation without difficulty Laryngoscope Size: Mac and 3 Grade View: Grade I Tube type: Oral Tube size: 7.0 mm Number of attempts: 1 Airway Equipment and Method: Stylet and Bite block Placement Confirmation: ETT inserted through vocal cords under direct vision, positive ETCO2 and breath sounds checked- equal and bilateral Secured at: 22 cm Tube secured with: Tape Dental Injury: Teeth and Oropharynx as per pre-operative assessment

## 2022-12-19 ENCOUNTER — Encounter (HOSPITAL_BASED_OUTPATIENT_CLINIC_OR_DEPARTMENT_OTHER): Payer: Self-pay | Admitting: Orthopaedic Surgery

## 2022-12-22 ENCOUNTER — Encounter: Payer: 59 | Admitting: Physical Therapy

## 2022-12-25 ENCOUNTER — Ambulatory Visit: Payer: 59 | Attending: Orthopaedic Surgery | Admitting: Physical Therapy

## 2022-12-25 DIAGNOSIS — G8929 Other chronic pain: Secondary | ICD-10-CM | POA: Diagnosis not present

## 2022-12-25 DIAGNOSIS — M25512 Pain in left shoulder: Secondary | ICD-10-CM | POA: Insufficient documentation

## 2022-12-25 DIAGNOSIS — M19012 Primary osteoarthritis, left shoulder: Secondary | ICD-10-CM | POA: Diagnosis not present

## 2022-12-25 NOTE — Therapy (Signed)
OUTPATIENT PHYSICAL THERAPY TREATMENT NOTE   Patient Name: Sara Perry MRN: [id number] DOB:1961-03-13, 62 y.o., female Today's Date: 12/25/2022  PCP: Dr. Einar Pheasant  REFERRING PROVIDER: Dr. Ophelia Charter   END OF SESSION:   PT End of Session - 12/25/22 1504     Visit Number 3    Number of Visits 20    Date for PT Re-Evaluation 01/27/23    Authorization Type [facility] [employer] [id number]    Authorization - Visit Number 3    Authorization - Number of Visits 20    Progress Note Due on Visit 10    PT Start Time 1500    PT Stop Time 1545    PT Time Calculation (min) 45 min    Activity Tolerance Patient limited by pain    Behavior During Therapy Agitated             Past Medical History:  Diagnosis Date   Arthritis    Complication of anesthesia    pt states she is very sensitive to most drugs, usually needs 1/2 of whatever other people get. She states as a child/teenager she had difficulty waking up after surgery due to the sensitivity that she has to drugs.   Eczema    Frequent UTI    H/O lymphadenopathy    With previous submandibular node removals.   Hypertension    Recurrent sinus infections    Sleep apnea    uses c-pap   Past Surgical History:  Procedure Laterality Date   COLONOSCOPY WITH PROPOFOL N/A 08/25/2017   Procedure: COLONOSCOPY WITH PROPOFOL;  Surgeon: Lucilla Lame, MD;  Location: [facility] ENDOSCOPY;  Service: Endoscopy;  Laterality: N/A;   COLONOSCOPY WITH PROPOFOL N/A 10/17/2022   Procedure: COLONOSCOPY WITH PROPOFOL;  Surgeon: Lucilla Lame, MD;  Location: [facility];  Service: Endoscopy;  Laterality: N/A;   LUMBAR FUSION  12/26/2019   l4-l5   Lymph Node Removal  1984   NECK SURGERY  1977   H/O Right neck surgery secondary to a benign growth with when patient was 43 or 62 yo   skin lesion extraction  2013   basal cell - Dr Phillip Heal   TOTAL SHOULDER ARTHROPLASTY Left 12/18/2022   Procedure: TOTAL SHOULDER ARTHROPLASTY;  Surgeon: Hiram Gash, MD;   Location: [facility];  Service: Orthopedics;  Laterality: Left;   Uterine Polyps Removed  2004   Patient Active Problem List   Diagnosis Date Noted   Pre-op evaluation 11/16/2022   History of colonic polyps 10/17/2022   Abdominal bruit 09/27/2021   Osteoarthritis 05/30/2021   Ganglion cyst 01/20/2021   Urinary frequency 09/17/2020   Sleep apnea 06/20/2020   Thumb pain, right 06/20/2020   Lumbar stenosis with neurogenic claudication 12/26/2019   Hyperbilirubinemia 12/14/2019   Eye injury 05/26/2019   Left shoulder pain 05/23/2018   Snoring 05/23/2018   Benign neoplasm of descending colon    Benign neoplasm of ascending colon    Vaginal atrophy 03/22/2017   Meniere's disease 08/17/2016   Colon cancer screening 08/11/2015   Fatigue 06/03/2015   Health care maintenance 06/03/2015   Neck pain 02/05/2014   Hypercholesterolemia 02/05/2014   Back pain 12/05/2012   Trigger finger 12/05/2012   Essential hypertension, benign 12/05/2012    REFERRING DIAG: Left shoulder pain, left shoulder osteoarthritis   THERAPY DIAG:  Chronic left shoulder pain  Primary osteoarthritis of left shoulder  Rationale for Evaluation and Treatment Rehabilitation  PERTINENT HISTORY: Pt reports that she is having left total  shoulder surgery on February 22nd. It is a same day surgery. She did PT 4 years ago for her left shoulder and she has also tried PRP and gel injections, but these did not help with her shoulder pain. She has had prior lumbar spine surgery several years ago, and since then she has used arms more rather than rotate, which is why she thinks it caused overuse in left shoulder. She here clicking and popping in shoulder when she reaches above her head followed by pain. She states that she brought her post surgical shoulder brace in and she would like to know more about how to wear it correctly and what she can and cannot do post surgery like whether she can use her left hand.    PRECAUTIONS: None   SUBJECTIVE:                                                                                                                                                                                      SUBJECTIVE STATEMENT:  Pt reports that she is waiting to hear back form [employer] about authorization for her surgery, which was originally planned for this time period. Holland Falling has since ordered that she completed 6 weeks of physical therapy before she is eligible for surgery. She continues to feel same level of left shoulder pain along with crepitus especially with reaching overhead.    PAIN:  Are you having pain? Yes: NPRS scale: 8/10 Pain location: Left shoulder  Pain description: Achy Aggravating factors: Moving overhead  Relieving factors: Heat and that is about it    OBJECTIVE: (objective measures completed at initial evaluation unless otherwise dated)   DIAGNOSTIC FINDINGS:  CLINICAL DATA:  Chronic left shoulder pain.   EXAM: CT OF THE UPPER LEFT EXTREMITY WITHOUT CONTRAST   TECHNIQUE: Multidetector CT imaging of the upper left extremity was performed according to the standard protocol.   RADIATION DOSE REDUCTION: This exam was performed according to the departmental dose-optimization program which includes automated exposure control, adjustment of the mA and/or kV according to patient size and/or use of iterative reconstruction technique.   COMPARISON:  Left shoulder x-rays dated May 20, 2018.   FINDINGS: Bones/Joint/Cartilage   No fracture or dislocation.   Severe osteoarthritis of the glenohumeral joint with severe joint space narrowing, bone-on-bone appearance, subchondral sclerosis, subchondral cystic changes and marginal osteophytosis. Moderate joint effusion.   Normal acromioclavicular joint.   Ligaments   Ligaments are suboptimally evaluated by CT.   Muscles and Tendons Grossly intact.  No muscle atrophy.   Soft tissue No fluid collection  or hematoma. No soft tissue mass. The visualized left lung is clear.   IMPRESSION:  1. Severe left glenohumeral osteoarthritis.     Electronically Signed   By: Titus Dubin M.D.   On: 11/04/2022 09:32   PATIENT SURVEYS:  FOTO 36/100 with target of 59    COGNITION: Overall cognitive status: Within functional limits for tasks assessed                                  SENSATION: WFL   POSTURE: No abnormalities noted    UPPER EXTREMITY ROM:    Active / PROM ROM Right eval Left eval  Shoulder flexion 180/180 90/180  Shoulder extension      Shoulder abduction 180/180 60*/60*  Shoulder adduction      Shoulder internal rotation (combined)  Thoracic Spine  PSIS   Shoulder external rotation      Elbow flexion      Elbow extension      Wrist flexion      Wrist extension      Wrist ulnar deviation      Wrist radial deviation      Wrist pronation      Wrist supination      (Blank rows = not tested)   UPPER EXTREMITY MMT:   MMT Right eval Left eval  Shoulder flexion 5 3+*  Shoulder extension      Shoulder abduction      Shoulder adduction 5 3+*  Shoulder internal rotation      Shoulder external rotation      Middle trapezius      Lower trapezius      Elbow flexion      Elbow extension      Wrist flexion      Wrist extension      Wrist ulnar deviation      Wrist radial deviation      Wrist pronation      Wrist supination      Grip strength (lbs)      (Blank rows = not tested)       JOINT MOBILITY TESTING:  Crepitus and firm end field at 90 degrees flexion and abduction    PALPATION:  TTP around entire left shoulder joint              TODAY'S TREATMENT:                                                                                                                                         DATE:   12/25/22: LUE Shoulder AAROM Flexion to 90 degrees in 30 deg abduction 3 x 10  LUE Shoulder AAROM External rotation 15 deg at 30 deg abduction 3 x 10  Elbow  Flexion and Extension AAROM on LUE  1 x 10   Discussed rehab protocol for shoulder total arthroplasty provided by clinician.   12/08/22:  Lianne Moris: LUE Shoulder flexion isometric  3 sec hold x 10  LUE Shoulder ER isometric 3 sec hold x 5 -Pt reports increased crepitus and pain that is limiting her activity LUE Shoulder IR isometric 3 sec hold x 10 LUE Shoulder Extension isometric 3 sec hold x 10   MANUAL: Demonstration with theracane for trigger point release  -Pt struggles to find trigger point on medial border of left shoulder blade   Trigger point release and massage of rhomboids on medial border of left shoulder blade    11/18/22 Reviewed video on how to don and doff don joy shoulder brace  [email or web address]   Discussed total shoulder protocol and gave patient a copy of the protocol: [email or web address]       PATIENT EDUCATION: Education details: form and technique for appropriate exercise and explanation about post rehab protocol  Person educated: Patient Education method: Explanation, Demonstration, Verbal cues, and Handouts Education comprehension: verbalized understanding, returned demonstration, and verbal cues required   HOME EXERCISE PROGRAM: Access Code: [email or web address] URL: [email or web address] Date: 12/08/2022 Prepared by: Bradly Chris  Exercises - Seated Upper Trapezius Stretch  - 1 x daily - 3 reps - 30-60 sec hold - Isometric Shoulder Flexion at Wall  - 1 x daily - 2 sets - 10 reps - 3 sec hold - Standing Isometric Shoulder Internal Rotation at Doorway  - 3 x weekly - 3 sets - 10 reps -  3 sec  hold   ASSESSMENT:   CLINICAL IMPRESSION:  Pt presents s/p 1 week post left shoulder total arthroplasty. Her pain is well controlled and she was able to perform all phase 1 ROM exercises without difficulty. PT  educated pt on precautions like no backwards shoulder extension and internal rotation. Frequency adjusted to factor in rehab protocol with patient returning 3 week and then 4 week mark. PT to continue to progress shoulder exercises within pain tolerance.    OBJECTIVE IMPAIRMENTS: decreased ROM, decreased strength, impaired UE functional use, and pain.    ACTIVITY LIMITATIONS: carrying, lifting, bathing, dressing, and reach over head   PARTICIPATION LIMITATIONS: cleaning, laundry, shopping, and occupation   PERSONAL FACTORS: Age, Time since onset of injury/illness/exacerbation, and 1 comorbidity: lumber surgery  are also affecting patient's functional outcome.    REHAB POTENTIAL: Good   CLINICAL DECISION MAKING: Stable/uncomplicated   EVALUATION COMPLEXITY: Low     GOALS: Goals reviewed with patient? No   SHORT TERM GOALS: Target date: 12/03/2022   Pt will be independent with HEP in order to improve strength and balance in order to decrease fall risk and improve function at home and work. Baseline: NT  Goal status: Ongoing    2.  Patient will demonstrate knowledge of how to correctly don and doff left shoulder brace in order to be able to do it after surgery.   Baseline: Patient able to perform independently  Goal status: MET       LONG TERM GOALS: Target date: 01/28/2023   Patient will have improved function and activity level as evidenced by an increase in FOTO score by 10 points or more.  Baseline: 36/100 with target of 59  Goal status: Ongoing    2.  Patient will show symmetrical AROM between left shoulder with right shoulder to regain ability to perform reaching tasks for her job.  Baseline: Shoulder Flexion R/L: 180/90*, Shoulder Abduction R/L 180/60, Shoulder combined IR R/L Thoracic spine, PSIS  Goal status: Ongoing   3. Patient will show symmetrical strength between left shoulder with right shoulder to regain ability to perform  reaching tasks for her job.  Baseline:  Unable to flex or abduct LUE past 90 degrees  Goal status: Ongoing     PLAN:   PT FREQUENCY: 1-2x/week   PT DURATION: 10 weeks   PLANNED INTERVENTIONS: Therapeutic exercises, Neuromuscular re-education, Self Care, Joint mobilization, Joint manipulation, Aquatic Therapy, Dry Needling, Electrical stimulation, Spinal manipulation, Spinal mobilization, Cryotherapy, Moist heat, Manual therapy, and Re-evaluation   PLAN FOR NEXT SESSION: Show patient post rehab exercises: AROM elbow and wrist, AAROM shoulder flexion, GH table slide, seated horizontal table slide, pendulums, progress parascapular exercises    Bradly Chris PT, DPT  12/25/2022, 5:29 PM

## 2022-12-29 ENCOUNTER — Ambulatory Visit: Payer: 59 | Admitting: Physical Therapy

## 2022-12-30 ENCOUNTER — Other Ambulatory Visit (INDEPENDENT_AMBULATORY_CARE_PROVIDER_SITE_OTHER): Payer: 59

## 2022-12-30 DIAGNOSIS — E7800 Pure hypercholesterolemia, unspecified: Secondary | ICD-10-CM

## 2022-12-30 LAB — HEPATIC FUNCTION PANEL
ALT: 15 U/L (ref 0–35)
AST: 14 U/L (ref 0–37)
Albumin: 4.2 g/dL (ref 3.5–5.2)
Alkaline Phosphatase: 73 U/L (ref 39–117)
Bilirubin, Direct: 0.1 mg/dL (ref 0.0–0.3)
Total Bilirubin: 0.8 mg/dL (ref 0.2–1.2)
Total Protein: 6.8 g/dL (ref 6.0–8.3)

## 2023-01-01 ENCOUNTER — Telehealth: Payer: Self-pay | Admitting: Physical Therapy

## 2023-01-01 ENCOUNTER — Ambulatory Visit: Payer: 59 | Admitting: Physical Therapy

## 2023-01-01 NOTE — Telephone Encounter (Signed)
Called pt to inquire about absence but did not reach. Left VM instructing pt to call back to reschedule an appointment.

## 2023-01-05 ENCOUNTER — Ambulatory Visit: Payer: 59 | Admitting: Physical Therapy

## 2023-01-08 ENCOUNTER — Ambulatory Visit: Payer: 59 | Admitting: Physical Therapy

## 2023-01-08 ENCOUNTER — Other Ambulatory Visit: Payer: Self-pay

## 2023-01-08 DIAGNOSIS — M25512 Pain in left shoulder: Secondary | ICD-10-CM | POA: Diagnosis not present

## 2023-01-08 DIAGNOSIS — M19012 Primary osteoarthritis, left shoulder: Secondary | ICD-10-CM | POA: Diagnosis not present

## 2023-01-08 DIAGNOSIS — G8929 Other chronic pain: Secondary | ICD-10-CM | POA: Diagnosis not present

## 2023-01-08 NOTE — Therapy (Signed)
OUTPATIENT PHYSICAL THERAPY TREATMENT NOTE   Patient Name: Sara Perry MRN: [id number] DOB:1961/04/28, 62 y.o., female Today's Date: 01/08/2023  PCP: Dr. Einar Pheasant  REFERRING PROVIDER: Dr. Ophelia Charter   END OF SESSION:   PT End of Session - 01/08/23 0942     Visit Number 4    Number of Visits 20    Date for PT Re-Evaluation 01/27/23    Authorization Type [facility] [employer] [id number]    Authorization - Visit Number 4    Authorization - Number of Visits 20    Progress Note Due on Visit 10    Activity Tolerance Patient tolerated treatment well    Behavior During Therapy [facility] for tasks assessed/performed              Past Medical History:  Diagnosis Date   Arthritis    Complication of anesthesia    pt states she is very sensitive to most drugs, usually needs 1/2 of whatever other people get. She states as a child/teenager she had difficulty waking up after surgery due to the sensitivity that she has to drugs.   Eczema    Frequent UTI    H/O lymphadenopathy    With previous submandibular node removals.   Hypertension    Recurrent sinus infections    Sleep apnea    uses c-pap   Past Surgical History:  Procedure Laterality Date   COLONOSCOPY WITH PROPOFOL N/A 08/25/2017   Procedure: COLONOSCOPY WITH PROPOFOL;  Surgeon: Lucilla Lame, MD;  Location: [facility] ENDOSCOPY;  Service: Endoscopy;  Laterality: N/A;   COLONOSCOPY WITH PROPOFOL N/A 10/17/2022   Procedure: COLONOSCOPY WITH PROPOFOL;  Surgeon: Lucilla Lame, MD;  Location: [place];  Service: Endoscopy;  Laterality: N/A;   LUMBAR FUSION  12/26/2019   l4-l5   Lymph Node Removal  1984   NECK SURGERY  1977   H/O Right neck surgery secondary to a benign growth with when patient was 40 or 62 yo   skin lesion extraction  2013   basal cell - Dr Phillip Heal   TOTAL SHOULDER ARTHROPLASTY Left 12/18/2022   Procedure: TOTAL SHOULDER ARTHROPLASTY;  Surgeon: Hiram Gash, MD;  Location: [place];  Service:  Orthopedics;  Laterality: Left;   Uterine Polyps Removed  2004   Patient Active Problem List   Diagnosis Date Noted   Pre-op evaluation 11/16/2022   History of colonic polyps 10/17/2022   Abdominal bruit 09/27/2021   Osteoarthritis 05/30/2021   Ganglion cyst 01/20/2021   Urinary frequency 09/17/2020   Sleep apnea 06/20/2020   Thumb pain, right 06/20/2020   Lumbar stenosis with neurogenic claudication 12/26/2019   Hyperbilirubinemia 12/14/2019   Eye injury 05/26/2019   Left shoulder pain 05/23/2018   Snoring 05/23/2018   Benign neoplasm of descending colon    Benign neoplasm of ascending colon    Vaginal atrophy 03/22/2017   Meniere's disease 08/17/2016   Colon cancer screening 08/11/2015   Fatigue 06/03/2015   Health care maintenance 06/03/2015   Neck pain 02/05/2014   Hypercholesterolemia 02/05/2014   Back pain 12/05/2012   Trigger finger 12/05/2012   Essential hypertension, benign 12/05/2012    REFERRING DIAG: Left shoulder pain, left shoulder osteoarthritis   THERAPY DIAG:  Chronic left shoulder pain  Primary osteoarthritis of left shoulder  Rationale for Evaluation and Treatment Rehabilitation  PERTINENT HISTORY: Pt reports that she is having left total shoulder surgery on February 22nd. It is a same day surgery. She did PT 4 years ago for her  left shoulder and she has also tried PRP and gel injections, but these did not help with her shoulder pain. She has had prior lumbar spine surgery several years ago, and since then she has used arms more rather than rotate, which is why she thinks it caused overuse in left shoulder. She here clicking and popping in shoulder when she reaches above her head followed by pain. She states that she brought her post surgical shoulder brace in and she would like to know more about how to wear it correctly and what she can and cannot do post surgery like whether she can use her left hand.   PRECAUTIONS: None   SUBJECTIVE:                                                                                                                                                                                       SUBJECTIVE STATEMENT:  Pt states she is not feeling much pain and only occasionally feels burning in left forearm. Exercises have been going ok.   PAIN:  Are you having pain? No   OBJECTIVE: (objective measures completed at initial evaluation unless otherwise dated)   DIAGNOSTIC FINDINGS:  CLINICAL DATA:  Chronic left shoulder pain.   EXAM: CT OF THE UPPER LEFT EXTREMITY WITHOUT CONTRAST   TECHNIQUE: Multidetector CT imaging of the upper left extremity was performed according to the standard protocol.   RADIATION DOSE REDUCTION: This exam was performed according to the departmental dose-optimization program which includes automated exposure control, adjustment of the mA and/or kV according to patient size and/or use of iterative reconstruction technique.   COMPARISON:  Left shoulder x-rays dated May 20, 2018.   FINDINGS: Bones/Joint/Cartilage   No fracture or dislocation.   Severe osteoarthritis of the glenohumeral joint with severe joint space narrowing, bone-on-bone appearance, subchondral sclerosis, subchondral cystic changes and marginal osteophytosis. Moderate joint effusion.   Normal acromioclavicular joint.   Ligaments   Ligaments are suboptimally evaluated by CT.   Muscles and Tendons Grossly intact.  No muscle atrophy.   Soft tissue No fluid collection or hematoma. No soft tissue mass. The visualized left lung is clear.   IMPRESSION: 1. Severe left glenohumeral osteoarthritis.     Electronically Signed   By: Titus Dubin M.D.   On: 11/04/2022 09:32   PATIENT SURVEYS:  FOTO 36/100 with target of 59    COGNITION: Overall cognitive status: Within functional limits for tasks assessed                                  SENSATION: [facility]  POSTURE: No abnormalities noted    UPPER  EXTREMITY ROM:    Active / PROM ROM Right eval Left eval  Shoulder flexion 180/180 90/180  Shoulder extension      Shoulder abduction 180/180 60*/60*  Shoulder adduction      Shoulder internal rotation (combined)  Thoracic Spine  PSIS   Shoulder external rotation      Elbow flexion      Elbow extension      Wrist flexion      Wrist extension      Wrist ulnar deviation      Wrist radial deviation      Wrist pronation      Wrist supination      (Blank rows = not tested)   UPPER EXTREMITY MMT:   MMT Right eval Left eval  Shoulder flexion 5 3+*  Shoulder extension      Shoulder abduction      Shoulder adduction 5 3+*  Shoulder internal rotation      Shoulder external rotation      Middle trapezius      Lower trapezius      Elbow flexion      Elbow extension      Wrist flexion      Wrist extension      Wrist ulnar deviation      Wrist radial deviation      Wrist pronation      Wrist supination      Grip strength (lbs)      (Blank rows = not tested)       JOINT MOBILITY TESTING:  Crepitus and firm end field at 90 degrees flexion and abduction    PALPATION:  TTP around entire left shoulder joint              TODAY'S TREATMENT:                                                                                                                                         DATE:  01/08/23: THEREX   LUE Shoulder Flex Isometric 5 sec hold 1 x 10  LUE Shoulder Abduction Isometric 5 sec hold 1 x 10  LUE Shoulder ER Isometric 5 sec hold 1 x 10   MANUAL  Trigger point release of left upper trap and right rhomboid   12/25/22: LUE Shoulder AAROM Flexion to 90 degrees in 30 deg abduction 3 x 10  LUE Shoulder AAROM External rotation 15 deg at 30 deg abduction 3 x 10  Elbow Flexion and Extension AAROM on LUE  1 x 10   Discussed rehab protocol for shoulder total arthroplasty provided by clinician.   12/08/22:  Lianne Moris: LUE Shoulder flexion isometric 3 sec hold x 10  LUE  Shoulder ER isometric 3 sec hold x 5 -Pt reports increased crepitus and pain that is limiting her activity LUE Shoulder IR isometric 3 sec  hold x 10 LUE Shoulder Extension isometric 3 sec hold x 10   MANUAL: Demonstration with theracane for trigger point release  -Pt struggles to find trigger point on medial border of left shoulder blade   Trigger point release and massage of rhomboids on medial border of left shoulder blade    11/18/22 Reviewed video on how to don and doff don joy shoulder brace  [email or web address]   Discussed total shoulder protocol and gave patient a copy of the protocol: [email or web address]       PATIENT EDUCATION: Education details: form and technique for appropriate exercise and explanation about post rehab protocol  Person educated: Patient Education method: Explanation, Demonstration, Verbal cues, and Handouts Education comprehension: verbalized understanding, returned demonstration, and verbal cues required   HOME EXERCISE PROGRAM: Access Code: [email or web address] URL: [email or web address] Date: 01/08/2023 Prepared by: Bradly Chris  Exercises - Seated Upper Trapezius Stretch  - 1 x daily - 3 reps - 30-60 sec hold - Seated Scapular Retraction  - 3 x daily - 3 sets - 10 reps - Supine Shoulder Flexion AAROM  - 1 x daily - 7 x weekly - 3 sets - 10 reps - Supine Shoulder External Rotation with Dowel  - 1 x daily - 3 sets - 10 reps - Isometric Shoulder Flexion  - 3 x weekly - 3 sets - 10 reps - 5 sec  hold - Isometric Shoulder Abduction at Wall  - 3 x weekly - 3 sets - 10 reps - 5 sec hold - Isometric Shoulder External Rotation  - 1 x daily - 3 sets - 10 reps - 5 sec  hold - Seated Elbow Flexion AAROM  - 1 x daily - 3 sets - 10 reps    ASSESSMENT:   CLINICAL IMPRESSION: Pt presents s/p 4 weeks post  left shoulder total arthroplasty. She continues to show excellent pain control and tolerance with performance of exercises. She shows improvement in LUE strength with ability to perform all isometrics without an increase in her pain. Next session will be 4 week mark therefore exercises will be progressed to phase II. 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   OBJECTIVE IMPAIRMENTS: decreased ROM, decreased strength, impaired UE functional use, and pain.    ACTIVITY LIMITATIONS: carrying, lifting, bathing, dressing, and reach over head   PARTICIPATION LIMITATIONS: cleaning, laundry, shopping, and occupation   PERSONAL FACTORS: Age, Time since onset of injury/illness/exacerbation, and 1 comorbidity: lumber surgery  are also affecting patient's functional outcome.    REHAB POTENTIAL: Good   CLINICAL DECISION MAKING: Stable/uncomplicated   EVALUATION COMPLEXITY: Low     GOALS: Goals reviewed with patient? No   SHORT TERM GOALS: Target date: 12/03/2022   Pt will be independent with HEP in order to improve strength and balance in order to decrease fall risk and improve function at home and work. Baseline: NT  Goal status: Ongoing    2.  Patient will demonstrate knowledge of how to correctly don and doff left shoulder brace in order to be able to do it after surgery.   Baseline: Patient able to perform independently  Goal status: MET       LONG TERM GOALS: Target date: 01/28/2023   Patient will have improved function and activity level as evidenced by an increase in FOTO score by 10 points or more.  Baseline: 36/100 with target of 59  Goal  status: Ongoing    2.  Patient will show symmetrical AROM between left shoulder with right shoulder to regain ability to perform reaching tasks for her job.  Baseline: Shoulder Flexion R/L: 180/90*, Shoulder Abduction R/L 180/60, Shoulder  combined IR R/L Thoracic spine, PSIS  Goal status: Ongoing   3. Patient will show symmetrical strength between left shoulder with right shoulder to regain ability to perform reaching tasks for her job.  Baseline: Unable to flex or abduct LUE past 90 degrees  Goal status: Ongoing     PLAN:   PT FREQUENCY: 1-2x/week   PT DURATION: 10 weeks   PLANNED INTERVENTIONS: Therapeutic exercises, Neuromuscular re-education, Self Care, Joint mobilization, Joint manipulation, Aquatic Therapy, Dry Needling, Electrical stimulation, Spinal manipulation, Spinal mobilization, Cryotherapy, Moist heat, Manual therapy, and Re-evaluation   PLAN FOR NEXT SESSION: Phase II exercises: Progress Flexion AROM to 120 degrees, etc.    Bradly Chris PT, DPT  01/08/2023, 9:42 AM

## 2023-01-09 ENCOUNTER — Other Ambulatory Visit: Payer: Self-pay | Admitting: Internal Medicine

## 2023-01-09 ENCOUNTER — Other Ambulatory Visit: Payer: Self-pay

## 2023-01-12 ENCOUNTER — Other Ambulatory Visit: Payer: Self-pay

## 2023-01-12 ENCOUNTER — Ambulatory Visit: Payer: 59 | Admitting: Physical Therapy

## 2023-01-12 MED FILL — Telmisartan Tab 40 MG: ORAL | 30 days supply | Qty: 30 | Fill #0 | Status: AC

## 2023-01-13 ENCOUNTER — Other Ambulatory Visit: Payer: Self-pay

## 2023-01-14 ENCOUNTER — Other Ambulatory Visit: Payer: Self-pay

## 2023-01-15 ENCOUNTER — Ambulatory Visit: Payer: 59 | Attending: Orthopaedic Surgery | Admitting: Physical Therapy

## 2023-01-15 DIAGNOSIS — G8929 Other chronic pain: Secondary | ICD-10-CM

## 2023-01-15 DIAGNOSIS — M25512 Pain in left shoulder: Secondary | ICD-10-CM | POA: Insufficient documentation

## 2023-01-15 DIAGNOSIS — M19012 Primary osteoarthritis, left shoulder: Secondary | ICD-10-CM

## 2023-01-15 NOTE — Therapy (Signed)
OUTPATIENT PHYSICAL THERAPY TREATMENT NOTE   Patient Name: Sara Perry MRN: [id number] DOB:03/15/1961, 62 y.o., female Today's Date: 01/15/2023  PCP: Dr. Einar Pheasant  REFERRING PROVIDER: Dr. Ophelia Charter   END OF SESSION:   PT End of Session - 01/15/23 1433     Visit Number 5    Number of Visits 20    Date for PT Re-Evaluation 01/27/23    Authorization Type [facility] [employer] [id number]    Authorization - Visit Number 5    Authorization - Number of Visits 20    Progress Note Due on Visit 10    PT Start Time 1330    PT Stop Time 1415    PT Time Calculation (min) 45 min    Activity Tolerance Patient tolerated treatment well    Behavior During Therapy WFL for tasks assessed/performed               Past Medical History:  Diagnosis Date   Arthritis    Complication of anesthesia    pt states she is very sensitive to most drugs, usually needs 1/2 of whatever other people get. She states as a child/teenager she had difficulty waking up after surgery due to the sensitivity that she has to drugs.   Eczema    Frequent UTI    H/O lymphadenopathy    With previous submandibular node removals.   Hypertension    Recurrent sinus infections    Sleep apnea    uses c-pap   Past Surgical History:  Procedure Laterality Date   COLONOSCOPY WITH PROPOFOL N/A 08/25/2017   Procedure: COLONOSCOPY WITH PROPOFOL;  Surgeon: Lucilla Lame, MD;  Location: [facility] ENDOSCOPY;  Service: Endoscopy;  Laterality: N/A;   COLONOSCOPY WITH PROPOFOL N/A 10/17/2022   Procedure: COLONOSCOPY WITH PROPOFOL;  Surgeon: Lucilla Lame, MD;  Location: [place];  Service: Endoscopy;  Laterality: N/A;   LUMBAR FUSION  12/26/2019   l4-l5   Lymph Node Removal  1984   NECK SURGERY  1977   H/O Right neck surgery secondary to a benign growth with when patient was 33 or 62 yo   skin lesion extraction  2013   basal cell - Dr Phillip Heal   TOTAL SHOULDER ARTHROPLASTY Left 12/18/2022   Procedure: TOTAL SHOULDER  ARTHROPLASTY;  Surgeon: Hiram Gash, MD;  Location: [place];  Service: Orthopedics;  Laterality: Left;   Uterine Polyps Removed  2004   Patient Active Problem List   Diagnosis Date Noted   Pre-op evaluation 11/16/2022   History of colonic polyps 10/17/2022   Abdominal bruit 09/27/2021   Osteoarthritis 05/30/2021   Ganglion cyst 01/20/2021   Urinary frequency 09/17/2020   Sleep apnea 06/20/2020   Thumb pain, right 06/20/2020   Lumbar stenosis with neurogenic claudication 12/26/2019   Hyperbilirubinemia 12/14/2019   Eye injury 05/26/2019   Left shoulder pain 05/23/2018   Snoring 05/23/2018   Benign neoplasm of descending colon    Benign neoplasm of ascending colon    Vaginal atrophy 03/22/2017   Meniere's disease 08/17/2016   Colon cancer screening 08/11/2015   Fatigue 06/03/2015   Health care maintenance 06/03/2015   Neck pain 02/05/2014   Hypercholesterolemia 02/05/2014   Back pain 12/05/2012   Trigger finger 12/05/2012   Essential hypertension, benign 12/05/2012    REFERRING DIAG: Left shoulder pain, left shoulder osteoarthritis   THERAPY DIAG:  Chronic left shoulder pain  Primary osteoarthritis of left shoulder  Rationale for Evaluation and Treatment Rehabilitation  PERTINENT HISTORY: Pt reports that  she is having left total shoulder surgery on February 22nd. It is a same day surgery. She did PT 4 years ago for her left shoulder and she has also tried PRP and gel injections, but these did not help with her shoulder pain. She has had prior lumbar spine surgery several years ago, and since then she has used arms more rather than rotate, which is why she thinks it caused overuse in left shoulder. She here clicking and popping in shoulder when she reaches above her head followed by pain. She states that she brought her post surgical shoulder brace in and she would like to know more about how to wear it correctly and what she can and cannot do post surgery  like whether she can use her left hand.   PRECAUTIONS: None   SUBJECTIVE:                                                                                                                                                                                      SUBJECTIVE STATEMENT:  Pt reports feeling some pain in discomfort in left shoulder after waking up from sleeping. This pain has gone down and it has not effected the way she is performing her exercises.   PAIN:  Are you having pain? No   OBJECTIVE: (objective measures completed at initial evaluation unless otherwise dated)   DIAGNOSTIC FINDINGS:  CLINICAL DATA:  Chronic left shoulder pain.   EXAM: CT OF THE UPPER LEFT EXTREMITY WITHOUT CONTRAST   TECHNIQUE: Multidetector CT imaging of the upper left extremity was performed according to the standard protocol.   RADIATION DOSE REDUCTION: This exam was performed according to the departmental dose-optimization program which includes automated exposure control, adjustment of the mA and/or kV according to patient size and/or use of iterative reconstruction technique.   COMPARISON:  Left shoulder x-rays dated May 20, 2018.   FINDINGS: Bones/Joint/Cartilage   No fracture or dislocation.   Severe osteoarthritis of the glenohumeral joint with severe joint space narrowing, bone-on-bone appearance, subchondral sclerosis, subchondral cystic changes and marginal osteophytosis. Moderate joint effusion.   Normal acromioclavicular joint.   Ligaments   Ligaments are suboptimally evaluated by CT.   Muscles and Tendons Grossly intact.  No muscle atrophy.   Soft tissue No fluid collection or hematoma. No soft tissue mass. The visualized left lung is clear.   IMPRESSION: 1. Severe left glenohumeral osteoarthritis.     Electronically Signed   By: Titus Dubin M.D.   On: 11/04/2022 09:32   PATIENT SURVEYS:  FOTO 36/100 with target of 59    COGNITION: Overall cognitive  status: Within functional limits for tasks assessed  SENSATION: WFL   POSTURE: No abnormalities noted    UPPER EXTREMITY ROM:    Active / PROM ROM Right eval Left eval  Shoulder flexion 180/180 90/180  Shoulder extension      Shoulder abduction 180/180 60*/60*  Shoulder adduction      Shoulder internal rotation (combined)  Thoracic Spine  PSIS   Shoulder external rotation      Elbow flexion      Elbow extension      Wrist flexion      Wrist extension      Wrist ulnar deviation      Wrist radial deviation      Wrist pronation      Wrist supination      (Blank rows = not tested)   UPPER EXTREMITY MMT:   MMT Right eval Left eval  Shoulder flexion 5 3+*  Shoulder extension      Shoulder abduction      Shoulder adduction 5 3+*  Shoulder internal rotation      Shoulder external rotation      Middle trapezius      Lower trapezius      Elbow flexion      Elbow extension      Wrist flexion      Wrist extension      Wrist ulnar deviation      Wrist radial deviation      Wrist pronation      Wrist supination      Grip strength (lbs)      (Blank rows = not tested)       JOINT MOBILITY TESTING:  Crepitus and firm end field at 90 degrees flexion and abduction    PALPATION:  TTP around entire left shoulder joint              TODAY'S TREATMENT:                                                                                                                                         DATE:   01/15/23: All exercises performed on LUE   Shoulder AAROM Pulleys Flex/ Ext 3 x 10  Seated Rows with Blue Band 3 x 10  Shoulder AAROM External Rotation at 0 deg abduction 3 x 10  Seated Shoulder Flex AAROM with wand 2 x 10  Shoulder Rhomboid Stretch Forward Fold 30 sec  Doorway Rhomboid Stretch 2 x 30 sec   01/08/23: THEREX   LUE Shoulder Flex Isometric 5 sec hold 1 x 10  LUE Shoulder Abduction Isometric 5 sec hold 1 x 10  LUE Shoulder ER  Isometric 5 sec hold 1 x 10   MANUAL  Trigger point release of left upper trap and right rhomboid   12/25/22: LUE Shoulder AAROM Flexion to 90 degrees in 30 deg abduction 3 x 10  LUE Shoulder AAROM External rotation 15 deg at 30 deg  abduction 3 x 10  Elbow Flexion and Extension AAROM on LUE  1 x 10   Discussed rehab protocol for shoulder total arthroplasty provided by clinician.   12/08/22:  Lianne Moris: LUE Shoulder flexion isometric 3 sec hold x 10  LUE Shoulder ER isometric 3 sec hold x 5 -Pt reports increased crepitus and pain that is limiting her activity LUE Shoulder IR isometric 3 sec hold x 10 LUE Shoulder Extension isometric 3 sec hold x 10   MANUAL: Demonstration with theracane for trigger point release  -Pt struggles to find trigger point on medial border of left shoulder blade   Trigger point release and massage of rhomboids on medial border of left shoulder blade    11/18/22 Reviewed video on how to don and doff don joy shoulder brace  [email or web address]   Discussed total shoulder protocol and gave patient a copy of the protocol: [email or web address]       PATIENT EDUCATION: Education details: form and technique for appropriate exercise and explanation about post rehab protocol  Person educated: Patient Education method: Explanation, Demonstration, Verbal cues, and Handouts Education comprehension: verbalized understanding, returned demonstration, and verbal cues required   HOME EXERCISE PROGRAM: Access Code: [email or web address] URL: [email or web address] Date: 01/08/2023 Prepared by: Bradly Chris  Exercises - Seated Upper Trapezius Stretch  - 1 x daily - 3 reps - 30-60 sec hold - Seated Scapular Retraction  - 3 x daily - 3 sets - 10 reps - Supine Shoulder Flexion AAROM  - 1 x daily - 7 x weekly - 3 sets - 10  reps - Supine Shoulder External Rotation with Dowel  - 1 x daily - 3 sets - 10 reps - Isometric Shoulder Flexion  - 3 x weekly - 3 sets - 10 reps - 5 sec  hold - Isometric Shoulder Abduction at Wall  - 3 x weekly - 3 sets - 10 reps - 5 sec hold - Isometric Shoulder External Rotation  - 1 x daily - 3 sets - 10 reps - 5 sec  hold - Seated Elbow Flexion AAROM  - 1 x daily - 3 sets - 10 reps    ASSESSMENT:   CLINICAL IMPRESSION: Pt presents s/p 5 weeks status post left shoulder total arthroplasty. She has started to initiate AAROM movements against gravity in sitting positions without an increase in her pain and through further ranges of motion. She shows increased left shoulder elevation with increased trap activation, but this is not abnormal for this phase of recovery. 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  OBJECTIVE IMPAIRMENTS: decreased ROM, decreased strength, impaired UE functional use, and pain.    ACTIVITY LIMITATIONS: carrying, lifting, bathing, dressing, and reach over head   PARTICIPATION LIMITATIONS: cleaning, laundry, shopping, and occupation   PERSONAL FACTORS: Age, Time since onset of injury/illness/exacerbation, and 1 comorbidity: lumber surgery  are also affecting patient's functional outcome.    REHAB POTENTIAL: Good   CLINICAL DECISION MAKING: Stable/uncomplicated   EVALUATION COMPLEXITY: Low     GOALS: Goals reviewed with patient? No   SHORT TERM GOALS: Target date: 12/03/2022   Pt will be independent with HEP in order to improve strength and balance in order to decrease fall risk and improve function at home and work. Baseline: NT  Goal status: Ongoing    2.  Patient will demonstrate knowledge of how to correctly don and doff left shoulder  brace in order to be able to do it after surgery.   Baseline: Patient able to perform independently  Goal  status: MET       LONG TERM GOALS: Target date: 01/28/2023   Patient will have improved function and activity level as evidenced by an increase in FOTO score by 10 points or more.  Baseline: 36/100 with target of 59  Goal status: Ongoing    2.  Patient will show symmetrical AROM between left shoulder with right shoulder to regain ability to perform reaching tasks for her job.  Baseline: Shoulder Flexion R/L: 180/90*, Shoulder Abduction R/L 180/60, Shoulder combined IR R/L Thoracic spine, PSIS  Goal status: Ongoing   3. Patient will show symmetrical strength between left shoulder with right shoulder to regain ability to perform reaching tasks for her job.  Baseline: Unable to flex or abduct LUE past 90 degrees  Goal status: Ongoing     PLAN:   PT FREQUENCY: 1-2x/week   PT DURATION: 10 weeks   PLANNED INTERVENTIONS: Therapeutic exercises, Neuromuscular re-education, Self Care, Joint mobilization, Joint manipulation, Aquatic Therapy, Dry Needling, Electrical stimulation, Spinal manipulation, Spinal mobilization, Cryotherapy, Moist heat, Manual therapy, and Re-evaluation   PLAN FOR NEXT SESSION: Continue to progress AAROM cane external rotation stretch and wash cloth press.   Bradly Chris PT, DPT  01/15/2023, 2:34 PM

## 2023-01-16 ENCOUNTER — Other Ambulatory Visit: Payer: Self-pay

## 2023-01-19 ENCOUNTER — Encounter: Payer: Self-pay | Admitting: Physical Therapy

## 2023-01-19 ENCOUNTER — Other Ambulatory Visit: Payer: Self-pay

## 2023-01-19 ENCOUNTER — Ambulatory Visit: Payer: 59 | Admitting: Physical Therapy

## 2023-01-19 DIAGNOSIS — M19012 Primary osteoarthritis, left shoulder: Secondary | ICD-10-CM | POA: Diagnosis not present

## 2023-01-19 DIAGNOSIS — G8929 Other chronic pain: Secondary | ICD-10-CM

## 2023-01-19 DIAGNOSIS — M25512 Pain in left shoulder: Secondary | ICD-10-CM | POA: Diagnosis not present

## 2023-01-19 NOTE — Therapy (Signed)
OUTPATIENT PHYSICAL THERAPY TREATMENT NOTE   Patient Name: Sara Perry MRN: [id number] DOB:1961-06-02, 62 y.o., female Today's Date: 01/19/2023  PCP: Dr. Dale Arispe  REFERRING PROVIDER: Dr. Ramond Marrow   END OF SESSION:   PT End of Session - 01/19/23 1332     Visit Number 6    Number of Visits 20    Date for PT Re-Evaluation 01/27/23    Authorization Type [facility] [employer] [id number]    Authorization - Visit Number 6    Authorization - Number of Visits 20    Progress Note Due on Visit 10    PT Start Time 1330    PT Stop Time 1415    PT Time Calculation (min) 45 min    Activity Tolerance Patient tolerated treatment well    Behavior During Therapy WFL for tasks assessed/performed               Past Medical History:  Diagnosis Date   Arthritis    Complication of anesthesia    pt states she is very sensitive to most drugs, usually needs 1/2 of whatever other people get. She states as a child/teenager she had difficulty waking up after surgery due to the sensitivity that she has to drugs.   Eczema    Frequent UTI    H/O lymphadenopathy    With previous submandibular node removals.   Hypertension    Recurrent sinus infections    Sleep apnea    uses c-pap   Past Surgical History:  Procedure Laterality Date   COLONOSCOPY WITH PROPOFOL N/A 08/25/2017   Procedure: COLONOSCOPY WITH PROPOFOL;  Surgeon: Midge Minium, MD;  Location: [facility];  Service: Endoscopy;  Laterality: N/A;   COLONOSCOPY WITH PROPOFOL N/A 10/17/2022   Procedure: COLONOSCOPY WITH PROPOFOL;  Surgeon: Midge Minium, MD;  Location: [facility];  Service: Endoscopy;  Laterality: N/A;   LUMBAR FUSION  12/26/2019   l4-l5   Lymph Node Removal  1984   NECK SURGERY  1977   H/O Right neck surgery secondary to a benign growth with when patient was 53 or 62 yo   skin lesion extraction  2013   basal cell - Dr Cheree Ditto   TOTAL SHOULDER ARTHROPLASTY Left 12/18/2022   Procedure: TOTAL SHOULDER  ARTHROPLASTY;  Surgeon: Bjorn Pippin, MD;  Location: [facility];  Service: Orthopedics;  Laterality: Left;   Uterine Polyps Removed  2004   Patient Active Problem List   Diagnosis Date Noted   Pre-op evaluation 11/16/2022   History of colonic polyps 10/17/2022   Abdominal bruit 09/27/2021   Osteoarthritis 05/30/2021   Ganglion cyst 01/20/2021   Urinary frequency 09/17/2020   Sleep apnea 06/20/2020   Thumb pain, right 06/20/2020   Lumbar stenosis with neurogenic claudication 12/26/2019   Hyperbilirubinemia 12/14/2019   Eye injury 05/26/2019   Left shoulder pain 05/23/2018   Snoring 05/23/2018   Benign neoplasm of descending colon    Benign neoplasm of ascending colon    Vaginal atrophy 03/22/2017   Meniere's disease 08/17/2016   Colon cancer screening 08/11/2015   Fatigue 06/03/2015   Health care maintenance 06/03/2015   Neck pain 02/05/2014   Hypercholesterolemia 02/05/2014   Back pain 12/05/2012   Trigger finger 12/05/2012   Essential hypertension, benign 12/05/2012    REFERRING DIAG: Left shoulder pain, left shoulder osteoarthritis   THERAPY DIAG:  Chronic left shoulder pain  Primary osteoarthritis of left shoulder  Rationale for Evaluation and Treatment Rehabilitation  PERTINENT HISTORY: Pt reports that  she is having left total shoulder surgery on February 22nd. It is a same day surgery. She did PT 4 years ago for her left shoulder and she has also tried PRP and gel injections, but these did not help with her shoulder pain. She has had prior lumbar spine surgery several years ago, and since then she has used arms more rather than rotate, which is why she thinks it caused overuse in left shoulder. She here clicking and popping in shoulder when she reaches above her head followed by pain. She states that she brought her post surgical shoulder brace in and she would like to know more about how to wear it correctly and what she can and cannot do post surgery  like whether she can use her left hand.   PRECAUTIONS: None   SUBJECTIVE:                                                                                                                                                                                      SUBJECTIVE STATEMENT:  Pt reports feeling some pain in discomfort in left shoulder after waking up from sleeping. This pain has gone down and it has not effected the way she is performing her exercises.   PAIN:  Are you having pain? No   OBJECTIVE: (objective measures completed at initial evaluation unless otherwise dated)   DIAGNOSTIC FINDINGS:  CLINICAL DATA:  Chronic left shoulder pain.   EXAM: CT OF THE UPPER LEFT EXTREMITY WITHOUT CONTRAST   TECHNIQUE: Multidetector CT imaging of the upper left extremity was performed according to the standard protocol.   RADIATION DOSE REDUCTION: This exam was performed according to the departmental dose-optimization program which includes automated exposure control, adjustment of the mA and/or kV according to patient size and/or use of iterative reconstruction technique.   COMPARISON:  Left shoulder x-rays dated May 20, 2018.   FINDINGS: Bones/Joint/Cartilage   No fracture or dislocation.   Severe osteoarthritis of the glenohumeral joint with severe joint space narrowing, bone-on-bone appearance, subchondral sclerosis, subchondral cystic changes and marginal osteophytosis. Moderate joint effusion.   Normal acromioclavicular joint.   Ligaments   Ligaments are suboptimally evaluated by CT.   Muscles and Tendons Grossly intact.  No muscle atrophy.   Soft tissue No fluid collection or hematoma. No soft tissue mass. The visualized left lung is clear.   IMPRESSION: 1. Severe left glenohumeral osteoarthritis.     Electronically Signed   By: Titus Dubin M.D.   On: 11/04/2022 09:32   PATIENT SURVEYS:  FOTO 36/100 with target of 59    COGNITION: Overall cognitive  status: Within functional limits for tasks assessed  SENSATION: WFL   POSTURE: No abnormalities noted    UPPER EXTREMITY ROM:    Active / PROM ROM Right eval Left eval  Shoulder flexion 180/180 90/180  Shoulder extension      Shoulder abduction 180/180 60*/60*  Shoulder adduction      Shoulder internal rotation (combined)  Thoracic Spine  PSIS   Shoulder external rotation      Elbow flexion      Elbow extension      Wrist flexion      Wrist extension      Wrist ulnar deviation      Wrist radial deviation      Wrist pronation      Wrist supination      (Blank rows = not tested)   UPPER EXTREMITY MMT:   MMT Right eval Left eval  Shoulder flexion 5 3+*  Shoulder extension      Shoulder abduction      Shoulder adduction 5 3+*  Shoulder internal rotation      Shoulder external rotation      Middle trapezius      Lower trapezius      Elbow flexion      Elbow extension      Wrist flexion      Wrist extension      Wrist ulnar deviation      Wrist radial deviation      Wrist pronation      Wrist supination      Grip strength (lbs)      (Blank rows = not tested)       JOINT MOBILITY TESTING:  Crepitus and firm end field at 90 degrees flexion and abduction    PALPATION:  TTP around entire left shoulder joint              TODAY'S TREATMENT:                                                                                                                                         DATE:   01/19/23:All exercises performed on LUE   THEREX  Shoulder AAROM Pulleys Flex/ Ext 3 x 10 -Pt able to flex arm to 160 degrees  Shoulder AAROM Pulleys Scaption Flex/ Ext 3 x 10  Inferior glide with 5 sec hold 1 x 10 Shoulder Extension Isometrics 5 sec holds in standing 1 x 10   MANUAL  Upper trap trigger point release and massage     01/15/23: All exercises performed on LUE   Shoulder AAROM Pulleys Flex/ Ext 3 x 10  Seated Rows with Blue Band 3  x 10  Shoulder AAROM External Rotation at 0 deg abduction 3 x 10  Seated Shoulder Flex AAROM with wand 2 x 10  Shoulder Rhomboid Stretch Forward Fold 30 sec  Doorway Rhomboid Stretch 2 x 30 sec   01/08/23: THEREX   LUE Shoulder Flex  Isometric 5 sec hold 1 x 10  LUE Shoulder Abduction Isometric 5 sec hold 1 x 10  LUE Shoulder ER Isometric 5 sec hold 1 x 10   MANUAL  Trigger point release of left upper trap and right rhomboid   12/25/22: LUE Shoulder AAROM Flexion to 90 degrees in 30 deg abduction 3 x 10  LUE Shoulder AAROM External rotation 15 deg at 30 deg abduction 3 x 10  Elbow Flexion and Extension AAROM on LUE  1 x 10   Discussed rehab protocol for shoulder total arthroplasty provided by clinician.   12/08/22:  Grant FontanaHEREX: LUE Shoulder flexion isometric 3 sec hold x 10  LUE Shoulder ER isometric 3 sec hold x 5 -Pt reports increased crepitus and pain that is limiting her activity LUE Shoulder IR isometric 3 sec hold x 10 LUE Shoulder Extension isometric 3 sec hold x 10   MANUAL: Demonstration with theracane for trigger point release  -Pt struggles to find trigger point on medial border of left shoulder blade   Trigger point release and massage of rhomboids on medial border of left shoulder blade    11/18/22 Reviewed video on how to don and doff don joy shoulder brace  [email or web address]   Discussed total shoulder protocol and gave patient a copy of the protocol: [email or web address]       PATIENT EDUCATION: Education details: form and technique for appropriate exercise and explanation about post rehab protocol  Person educated: Patient Education method: Explanation, Demonstration, Verbal cues, and Handouts Education comprehension: verbalized understanding, returned demonstration, and verbal cues required   HOME EXERCISE  PROGRAM: Access Code: [email or web address] URL: [email or web address] Date: 01/19/2023 Prepared by: Ellin Goodieaniel Twisha Vanpelt  Exercises - Seated Upper Trapezius Stretch  - 1 x daily - 3 reps - 30-60 sec hold - Seated Levator Scapulae Stretch  - 1 x daily - 3 reps - 30 sec  hold - Seated Shoulder Flexion AAROM with Pulley Behind  - 1 x daily - 3 sets - 10 reps - Seated Single Arm Shoulder Row with Anchored Resistance  - 3 x weekly - 3 sets - 10 reps - Supine Shoulder Flexion AAROM  - 1 x daily - 7 x weekly - 3 sets - 10 reps - Supine Shoulder External Rotation with Dowel  - 1 x daily - 3 sets - 10 reps - Isometric Shoulder Flexion  - 3 x weekly - 3 sets - 10 reps - 5 sec  hold - Isometric Shoulder Abduction at Wall  - 3 x weekly - 3 sets - 10 reps - 5 sec hold - Isometric Shoulder External Rotation  - 1 x daily - 3 sets - 10 reps - 5 sec  hold - Seated Elbow Flexion AAROM  - 1 x daily - 3 sets - 10 reps - Doorway Rhomboid Stretch  - 1 x daily - 3 reps - 30 sec  hold - Standing Isometric Shoulder Extension with Doorway  - 3 x weekly - 3 sets - 10 reps - 5 sec hold    ASSESSMENT:   CLINICAL IMPRESSION: Pt presents 6 weeks s/p left shoulder total arthroplasty. She continues to show improvements with left shoulder ROM with ability to passively move shoulder to 160 degrees of flexion and abduction without pain. She continues to have increased muscular tension through bilateral upper traps with resolution of some of the trigger points with soft tissue massage. 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   OBJECTIVE IMPAIRMENTS: decreased ROM, decreased strength, impaired UE functional use, and pain.    ACTIVITY LIMITATIONS: carrying, lifting, bathing, dressing, and reach over head   PARTICIPATION LIMITATIONS: cleaning, laundry, shopping, and occupation   PERSONAL FACTORS: Age, Time since  onset of injury/illness/exacerbation, and 1 comorbidity: lumber surgery  are also affecting patient's functional outcome.    REHAB POTENTIAL: Good   CLINICAL DECISION MAKING: Stable/uncomplicated   EVALUATION COMPLEXITY: Low     GOALS: Goals reviewed with patient? No   SHORT TERM GOALS: Target date: 12/03/2022   Pt will be independent with HEP in order to improve strength and balance in order to decrease fall risk and improve function at home and work. Baseline: NT  Goal status: Ongoing    2.  Patient will demonstrate knowledge of how to correctly don and doff left shoulder brace in order to be able to do it after surgery.   Baseline: Patient able to perform independently  Goal status: MET       LONG TERM GOALS: Target date: 01/28/2023   Patient will have improved function and activity level as evidenced by an increase in FOTO score by 10 points or more.  Baseline: 36/100 with target of 59  Goal status: Ongoing    2.  Patient will show symmetrical AROM between left shoulder with right shoulder to regain ability to perform reaching tasks for her job.  Baseline: Shoulder Flexion R/L: 180/90*, Shoulder Abduction R/L 180/60, Shoulder combined IR R/L Thoracic spine, PSIS  Goal status: Ongoing   3. Patient will show symmetrical strength between left shoulder with right shoulder to regain ability to perform reaching tasks for her job.  Baseline: Unable to flex or abduct LUE past 90 degrees  Goal status: Ongoing     PLAN:   PT FREQUENCY: 1-2x/week   PT DURATION: 10 weeks   PLANNED INTERVENTIONS: Therapeutic exercises, Neuromuscular re-education, Self Care, Joint mobilization, Joint manipulation, Aquatic Therapy, Dry Needling, Electrical stimulation, Spinal manipulation, Spinal mobilization, Cryotherapy, Moist heat, Manual therapy, and Re-evaluation   PLAN FOR NEXT SESSION: Begin AROM exercises.  Ellin Goodie PT, DPT  01/19/2023, 1:34 PM

## 2023-01-21 ENCOUNTER — Other Ambulatory Visit: Payer: Self-pay

## 2023-01-21 MED ORDER — CLOBETASOL PROPIONATE 0.05 % EX SOLN
1.0000 | Freq: Every day | CUTANEOUS | 1 refills | Status: DC | PRN
  Filled 2023-01-21: qty 50, 30d supply, fill #0
  Filled 2023-03-19: qty 50, 30d supply, fill #1

## 2023-01-27 ENCOUNTER — Ambulatory Visit: Payer: 59 | Admitting: Physical Therapy

## 2023-01-27 ENCOUNTER — Other Ambulatory Visit: Payer: Self-pay

## 2023-01-27 DIAGNOSIS — M25512 Pain in left shoulder: Secondary | ICD-10-CM | POA: Diagnosis not present

## 2023-01-27 DIAGNOSIS — M19012 Primary osteoarthritis, left shoulder: Secondary | ICD-10-CM

## 2023-01-27 DIAGNOSIS — G8929 Other chronic pain: Secondary | ICD-10-CM | POA: Diagnosis not present

## 2023-01-27 NOTE — Therapy (Addendum)
OUTPATIENT PHYSICAL THERAPY TREATMENT NOTE/ Re-Certification  Dates of Reporting: 01/28/23-03/30/23  Patient Name: Sara Perry MRN: [id number] DOB:1961-07-13, 62 y.o., female Today's Date: 01/28/2023  PCP: Dr. Dale   REFERRING PROVIDER: Dr. Ramond Marrow   END OF SESSION:   PT End of Session - 01/27/23 1424     Visit Number 7    Number of Visits 20    Date for PT Re-Evaluation 01/27/23    Authorization Type [facility] [employer] [id number]    Authorization - Visit Number 7    Authorization - Number of Visits 20    Progress Note Due on Visit 10    PT Start Time 1330    PT Stop Time 1415    PT Time Calculation (min) 45 min    Activity Tolerance Patient tolerated treatment well    Behavior During Therapy WFL for tasks assessed/performed                Past Medical History:  Diagnosis Date   Arthritis    Complication of anesthesia    pt states she is very sensitive to most drugs, usually needs 1/2 of whatever other people get. She states as a child/teenager she had difficulty waking up after surgery due to the sensitivity that she has to drugs.   Eczema    Frequent UTI    H/O lymphadenopathy    With previous submandibular node removals.   Hypertension    Recurrent sinus infections    Sleep apnea    uses c-pap   Past Surgical History:  Procedure Laterality Date   COLONOSCOPY WITH PROPOFOL N/A 08/25/2017   Procedure: COLONOSCOPY WITH PROPOFOL;  Surgeon: Midge Minium, MD;  Location[facility] ENDOSCOPY;  Service: Endoscopy;  Laterality: N/A;   COLONOSCOPY WITH PROPOFOL N/A 10/17/2022   Procedure: COLONOSCOPY WITH PROPOFOL;  Surgeon: Midge Minium, MD;  Location: [facility];  Service: Endoscopy;  Laterality: N/A;   LUMBAR FUSION  12/26/2019   l4-l5   Lymph Node Removal  1984   NECK SURGERY  1977   H/O Right neck surgery secondary to a benign growth with when patient was 54 or 62 yo   skin lesion extraction  2013   basal cell - Dr Cheree Ditto   TOTAL SHOULDER  ARTHROPLASTY Left 12/18/2022   Procedure: TOTAL SHOULDER ARTHROPLASTY;  Surgeon: Bjorn Pippin, MD;  Location: [facility];  Service: Orthopedics;  Laterality: Left;   Uterine Polyps Removed  2004   Patient Active Problem List   Diagnosis Date Noted   Pre-op evaluation 11/16/2022   History of colonic polyps 10/17/2022   Abdominal bruit 09/27/2021   Osteoarthritis 05/30/2021   Ganglion cyst 01/20/2021   Urinary frequency 09/17/2020   Sleep apnea 06/20/2020   Thumb pain, right 06/20/2020   Lumbar stenosis with neurogenic claudication 12/26/2019   Hyperbilirubinemia 12/14/2019   Eye injury 05/26/2019   Left shoulder pain 05/23/2018   Snoring 05/23/2018   Benign neoplasm of descending colon    Benign neoplasm of ascending colon    Vaginal atrophy 03/22/2017   Meniere's disease 08/17/2016   Colon cancer screening 08/11/2015   Fatigue 06/03/2015   Health care maintenance 06/03/2015   Neck pain 02/05/2014   Hypercholesterolemia 02/05/2014   Back pain 12/05/2012   Trigger finger 12/05/2012   Essential hypertension, benign 12/05/2012    REFERRING DIAG: Left shoulder pain, left shoulder osteoarthritis   THERAPY DIAG:  Chronic left shoulder pain  Primary osteoarthritis of left shoulder  Rationale for Evaluation and Treatment Rehabilitation  PERTINENT HISTORY: Pt reports that she is having left total shoulder surgery on February 22nd. It is a same day surgery. She did PT 4 years ago for her left shoulder and she has also tried PRP and gel injections, but these did not help with her shoulder pain. She has had prior lumbar spine surgery several years ago, and since then she has used arms more rather than rotate, which is why she thinks it caused overuse in left shoulder. She here clicking and popping in shoulder when she reaches above her head followed by pain. She states that she brought her post surgical shoulder brace in and she would like to know more about how to wear it  correctly and what she can and cannot do post surgery like whether she can use her left hand.   PRECAUTIONS: None   SUBJECTIVE:                                                                                                                                                                                      SUBJECTIVE STATEMENT: Pt reports that she had a check up with orthopedist and it was noted that she is making good progress. She is planning to return to work on May 6th with light duty. She continues to have increased parascapular tension.   PAIN:  Are you having pain? No   OBJECTIVE: (objective measures completed at initial evaluation unless otherwise dated)   DIAGNOSTIC FINDINGS:  CLINICAL DATA:  Chronic left shoulder pain.   EXAM: CT OF THE UPPER LEFT EXTREMITY WITHOUT CONTRAST   TECHNIQUE: Multidetector CT imaging of the upper left extremity was performed according to the standard protocol.   RADIATION DOSE REDUCTION: This exam was performed according to the departmental dose-optimization program which includes automated exposure control, adjustment of the mA and/or kV according to patient size and/or use of iterative reconstruction technique.   COMPARISON:  Left shoulder x-rays dated May 20, 2018.   FINDINGS: Bones/Joint/Cartilage   No fracture or dislocation.   Severe osteoarthritis of the glenohumeral joint with severe joint space narrowing, bone-on-bone appearance, subchondral sclerosis, subchondral cystic changes and marginal osteophytosis. Moderate joint effusion.   Normal acromioclavicular joint.   Ligaments   Ligaments are suboptimally evaluated by CT.   Muscles and Tendons Grossly intact.  No muscle atrophy.   Soft tissue No fluid collection or hematoma. No soft tissue mass. The visualized left lung is clear.   IMPRESSION: 1. Severe left glenohumeral osteoarthritis.     Electronically Signed   By: Obie Dredge M.D.   On: 11/04/2022  09:32   PATIENT SURVEYS:  FOTO 36/100 with target of 59  COGNITION: Overall cognitive status: Within functional limits for tasks assessed                                  SENSATION: WFL   POSTURE: No abnormalities noted    UPPER EXTREMITY ROM:    Active / PROM ROM Right eval Left eval  Shoulder flexion 180/180 90/180  Shoulder extension      Shoulder abduction 180/180 60*/60*  Shoulder adduction      Shoulder internal rotation (combined)  Thoracic Spine  PSIS   Shoulder external rotation      Elbow flexion      Elbow extension      Wrist flexion      Wrist extension      Wrist ulnar deviation      Wrist radial deviation      Wrist pronation      Wrist supination      (Blank rows = not tested)   UPPER EXTREMITY MMT:   MMT Right eval Left eval  Shoulder flexion 5 3+*  Shoulder extension      Shoulder abduction      Shoulder adduction 5 3+*  Shoulder internal rotation      Shoulder external rotation      Middle trapezius      Lower trapezius      Elbow flexion      Elbow extension      Wrist flexion      Wrist extension      Wrist ulnar deviation      Wrist radial deviation      Wrist pronation      Wrist supination      Grip strength (lbs)      (Blank rows = not tested)       JOINT MOBILITY TESTING:  Crepitus and firm end field at 90 degrees flexion and abduction    PALPATION:  TTP around entire left shoulder joint              TODAY'S TREATMENT:                                                                                                                                         DATE:   01/27/23:  Shoulder AAROM Pulleys Flex/ Ext 3 x 10 -Pt able to flex arm to 160 degrees  Shoulder AAROM Pulleys Scaption Flex/ Ext 3 x 10   Tomasa Hose PT, DPT administered treatment due to treating PT, Ellin Goodie not having certification:  Trigger Point Dry-Needling  Treatment instructions: Expect mild to moderate muscle soreness. S/S of pneumothorax  if dry needled over a lung field, and to seek immediate medical attention should they occur. Patient verbalized understanding of these instructions and education.  Patient Consent Given: Yes Education handout provided: No Muscles treated: Infraspinatus and left upper  trap  Electrical stimulation performed: No Parameters: N/A Treatment response/outcome: Twitch in left infraspinatus muscle  Shoulder Flex PROM on LUE 160 degrees  Shoulder ER PROM on LUE 60 degrees Shoulder IR PROM on LUE 80 degrees  Shoulder AROM Flexion/Extension in sitting 1 x 10  Shoulder AROM Abduction/ Adduction in sitting 1 x 10   01/19/23:All exercises performed on LUE   THEREX  Shoulder AAROM Pulleys Flex/ Ext 3 x 10 -Pt able to flex arm to 160 degrees  Shoulder AAROM Pulleys Scaption Flex/ Ext 3 x 10  Inferior glide with 5 sec hold 1 x 10 Shoulder Extension Isometrics 5 sec holds in standing 1 x 10   MANUAL  Upper trap trigger point release and massage     01/15/23: All exercises performed on LUE   Shoulder AAROM Pulleys Flex/ Ext 3 x 10  Seated Rows with Blue Band 3 x 10  Shoulder AAROM External Rotation at 0 deg abduction 3 x 10  Seated Shoulder Flex AAROM with wand 2 x 10  Shoulder Rhomboid Stretch Forward Fold 30 sec  Doorway Rhomboid Stretch 2 x 30 sec   01/08/23: THEREX   LUE Shoulder Flex Isometric 5 sec hold 1 x 10  LUE Shoulder Abduction Isometric 5 sec hold 1 x 10  LUE Shoulder ER Isometric 5 sec hold 1 x 10   MANUAL  Trigger point release of left upper trap and right rhomboid   12/25/22: LUE Shoulder AAROM Flexion to 90 degrees in 30 deg abduction 3 x 10  LUE Shoulder AAROM External rotation 15 deg at 30 deg abduction 3 x 10  Elbow Flexion and Extension AAROM on LUE  1 x 10   Discussed rehab protocol for shoulder total arthroplasty provided by clinician.   12/08/22:  Grant Fontana: LUE Shoulder flexion isometric 3 sec hold x 10  LUE Shoulder ER isometric 3 sec hold x 5 -Pt reports  increased crepitus and pain that is limiting her activity LUE Shoulder IR isometric 3 sec hold x 10 LUE Shoulder Extension isometric 3 sec hold x 10   MANUAL: Demonstration with theracane for trigger point release  -Pt struggles to find trigger point on medial border of left shoulder blade   Trigger point release and massage of rhomboids on medial border of left shoulder blade    11/18/22 Reviewed video on how to don and doff don joy shoulder brace  [email or web address]   Discussed total shoulder protocol and gave patient a copy of the protocol: [email or web address]     PATIENT EDUCATION: Education details: form and technique for appropriate exercise and explanation about post rehab protocol  Person educated: Patient Education method: Explanation, Demonstration, Verbal cues, and Handouts Education comprehension: verbalized understanding, returned demonstration, and verbal cues required   HOME EXERCISE PROGRAM: Access Code: [email or web address] URL: [email or web address] Date: 01/27/2023 Prepared by: Ellin Goodie  Exercises - Seated Upper Trapezius Stretch  - 1 x daily - 3 reps - 30-60 sec hold - Seated Levator Scapulae Stretch  - 1 x daily - 3 reps - 30 sec  hold - Seated Single Arm Shoulder Row with Anchored Resistance  - 3 x weekly - 3 sets - 10 reps - Supine Shoulder External Rotation with Dowel  - 1 x daily - 3 sets - 10 reps - Isometric Shoulder Flexion  - 3 x weekly - 3 sets - 10 reps - 5 sec  hold - Isometric Shoulder Abduction at Wall  - 3 x weekly - 3 sets - 10 reps - 5  sec hold - Isometric Shoulder External Rotation  - 1 x daily - 3 sets - 10 reps - 5 sec  hold - Seated Elbow Flexion AAROM  - 1 x daily - 3 sets - 10 reps - Doorway Rhomboid Stretch  - 1 x daily - 3 reps - 30 sec  hold - Standing Isometric Shoulder Extension  with Doorway  - 3 x weekly - 3 sets - 10 reps - 5 sec hold - Standing Sleeper Stretch at Wall  - 1 x daily - 3 reps - 30 sec  hold - Shoulder Abduction Towel Slide on Railing   - 1 x daily - 3 sets - 10 reps - Seated Shoulder Flexion on Railing   - 1 x daily - 3 sets - 10 reps   ASSESSMENT:   CLINICAL IMPRESSION: Pt presents 6 weeks s/p left shoulder total arthroplasty. She is now progressed to perform AROM exercises without pain or discomfort. Trigger point release of increased muscular tension in left RTC and parascapular with use of dry needling. 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  OBJECTIVE IMPAIRMENTS: decreased ROM, decreased strength, impaired UE functional use, and pain.    ACTIVITY LIMITATIONS: carrying, lifting, bathing, dressing, and reach over head   PARTICIPATION LIMITATIONS: cleaning, laundry, shopping, and occupation   PERSONAL FACTORS: Age, Time since onset of injury/illness/exacerbation, and 1 comorbidity: lumber surgery  are also affecting patient's functional outcome.    REHAB POTENTIAL: Good   CLINICAL DECISION MAKING: Stable/uncomplicated   EVALUATION COMPLEXITY: Low     GOALS: Goals reviewed with patient? No   SHORT TERM GOALS: Target date: 12/03/2022   Pt will be independent with HEP in order to improve strength and balance in order to decrease fall risk and improve function at home and work. Baseline: NT  Goal status: Ongoing    2.  Patient will demonstrate knowledge of how to correctly don and doff left shoulder brace in order to be able to do it after surgery.   Baseline: Patient able to perform independently  Goal status: MET       LONG TERM GOALS: Target date: 01/28/2023   Patient will have improved function and activity level as evidenced by an increase in FOTO score by 10 points or more.  Baseline: 36/100 with target of 59  Goal  status: Ongoing    2.  Patient will show symmetrical AROM between left shoulder with right shoulder to regain ability to perform reaching tasks for her job.  Baseline: Shoulder Flexion R/L: 180/90*, Shoulder Abduction R/L 180/60, Shoulder combined IR R/L Thoracic spine, PSIS  Goal status: Ongoing   3. Patient will show symmetrical strength between left shoulder with right shoulder to regain ability to perform reaching tasks for her job.  Baseline: Unable to flex or abduct LUE past 90 degrees  Goal status: Ongoing     PLAN:   PT FREQUENCY: 1-2x/week   PT DURATION: 10 weeks   PLANNED INTERVENTIONS: Therapeutic exercises, Neuromuscular re-education, Self Care, Joint mobilization, Joint manipulation, Aquatic Therapy, Dry Needling, Electrical stimulation, Spinal manipulation, Spinal mobilization, Cryotherapy, Moist heat, Manual therapy, and Re-evaluation   PLAN FOR NEXT SESSION: Begin AROM exercises and progress parascapular exercises   Ellin Goodie PT, DPT  01/28/2023, 2:54 PM

## 2023-01-29 ENCOUNTER — Ambulatory Visit: Payer: 59 | Admitting: Physical Therapy

## 2023-01-29 ENCOUNTER — Other Ambulatory Visit: Payer: Self-pay

## 2023-01-29 DIAGNOSIS — M19012 Primary osteoarthritis, left shoulder: Secondary | ICD-10-CM

## 2023-01-29 DIAGNOSIS — G8929 Other chronic pain: Secondary | ICD-10-CM

## 2023-01-29 DIAGNOSIS — M25512 Pain in left shoulder: Secondary | ICD-10-CM | POA: Diagnosis not present

## 2023-01-29 NOTE — Therapy (Addendum)
OUTPATIENT PHYSICAL THERAPY TREATMENT NOTE   Patient Name: Sara Perry MRN: [id number] DOB:November 18, 1960, 62 y.o., female Today's Date: 01/29/2023  PCP: Dr. Dale [place]  REFERRING PROVIDER: Dr. Ramond Marrow   END OF SESSION:   PT End of Session - 01/29/23 1404     Visit Number 8    Number of Visits 20    Date for PT Re-Evaluation 01/27/23    Authorization Type [facility] 2024    Authorization Time Period 01/28/23-03/30/23    Authorization - Visit Number 8    Authorization - Number of Visits 20    Progress Note Due on Visit 10    PT Start Time 1335    PT Stop Time 1415    PT Time Calculation (min) 40 min    Activity Tolerance Patient tolerated treatment well    Behavior During Therapy WFL for tasks assessed/performed                 Past Medical History:  Diagnosis Date   Arthritis    Complication of anesthesia    pt states she is very sensitive to most drugs, usually needs 1/2 of whatever other people get. She states as a child/teenager she had difficulty waking up after surgery due to the sensitivity that she has to drugs.   Eczema    Frequent UTI    H/O lymphadenopathy    With previous submandibular node removals.   Hypertension    Recurrent sinus infections    Sleep apnea    uses c-pap   Past Surgical History:  Procedure Laterality Date   COLONOSCOPY WITH PROPOFOL N/A 08/25/2017   Procedure: COLONOSCOPY WITH PROPOFOL;  Surgeon: Midge Minium, MD;  Location: [facility];  Service: Endoscopy;  Laterality: N/A;   COLONOSCOPY WITH PROPOFOL N/A 10/17/2022   Procedure: COLONOSCOPY WITH PROPOFOL;  Surgeon: Midge Minium, MD;  Location: [facility];  Service: Endoscopy;  Laterality: N/A;   LUMBAR FUSION  12/26/2019   l4-l5   Lymph Node Removal  1984   NECK SURGERY  1977   H/O Right neck surgery secondary to a benign growth with when patient was 40 or 62 yo   skin lesion extraction  2013   basal cell - Dr Cheree Ditto   TOTAL SHOULDER ARTHROPLASTY Left  12/18/2022   Procedure: TOTAL SHOULDER ARTHROPLASTY;  Surgeon: Bjorn Pippin, MD;  Location: [facility];  Service: Orthopedics;  Laterality: Left;   Uterine Polyps Removed  2004   Patient Active Problem List   Diagnosis Date Noted   Pre-op evaluation 11/16/2022   History of colonic polyps 10/17/2022   Abdominal bruit 09/27/2021   Osteoarthritis 05/30/2021   Ganglion cyst 01/20/2021   Urinary frequency 09/17/2020   Sleep apnea 06/20/2020   Thumb pain, right 06/20/2020   Lumbar stenosis with neurogenic claudication 12/26/2019   Hyperbilirubinemia 12/14/2019   Eye injury 05/26/2019   Left shoulder pain 05/23/2018   Snoring 05/23/2018   Benign neoplasm of descending colon    Benign neoplasm of ascending colon    Vaginal atrophy 03/22/2017   Meniere's disease 08/17/2016   Colon cancer screening 08/11/2015   Fatigue 06/03/2015   Health care maintenance 06/03/2015   Neck pain 02/05/2014   Hypercholesterolemia 02/05/2014   Back pain 12/05/2012   Trigger finger 12/05/2012   Essential hypertension, benign 12/05/2012    REFERRING DIAG: Left shoulder pain, left shoulder osteoarthritis   THERAPY DIAG:  Chronic left shoulder pain  Primary osteoarthritis of left shoulder  Rationale for Evaluation  and Treatment Rehabilitation  PERTINENT HISTORY: Pt reports that she is having left total shoulder surgery on February 22nd. It is a same day surgery. She did PT 4 years ago for her left shoulder and she has also tried PRP and gel injections, but these did not help with her shoulder pain. She has had prior lumbar spine surgery several years ago, and since then she has used arms more rather than rotate, which is why she thinks it caused overuse in left shoulder. She here clicking and popping in shoulder when she reaches above her head followed by pain. She states that she brought her post surgical shoulder brace in and she would like to know more about how to wear it correctly and what  she can and cannot do post surgery like whether she can use her left hand.   PRECAUTIONS: None   SUBJECTIVE:                                                                                                                                                                                      SUBJECTIVE STATEMENT: Pt states that she is feeling some soreness in her left shoulder, because of increased resistance with exercises.   PAIN:  Are you having pain? No   OBJECTIVE: (objective measures completed at initial evaluation unless otherwise dated)   DIAGNOSTIC FINDINGS:  CLINICAL DATA:  Chronic left shoulder pain.   EXAM: CT OF THE UPPER LEFT EXTREMITY WITHOUT CONTRAST   TECHNIQUE: Multidetector CT imaging of the upper left extremity was performed according to the standard protocol.   RADIATION DOSE REDUCTION: This exam was performed according to the departmental dose-optimization program which includes automated exposure control, adjustment of the mA and/or kV according to patient size and/or use of iterative reconstruction technique.   COMPARISON:  Left shoulder x-rays dated May 20, 2018.   FINDINGS: Bones/Joint/Cartilage   No fracture or dislocation.   Severe osteoarthritis of the glenohumeral joint with severe joint space narrowing, bone-on-bone appearance, subchondral sclerosis, subchondral cystic changes and marginal osteophytosis. Moderate joint effusion.   Normal acromioclavicular joint.   Ligaments   Ligaments are suboptimally evaluated by CT.   Muscles and Tendons Grossly intact.  No muscle atrophy.   Soft tissue No fluid collection or hematoma. No soft tissue mass. The visualized left lung is clear.   IMPRESSION: 1. Severe left glenohumeral osteoarthritis.     Electronically Signed   By: Obie Dredge M.D.   On: 11/04/2022 09:32   PATIENT SURVEYS:  FOTO 36/100 with target of 59    COGNITION: Overall cognitive status: Within functional limits  for tasks assessed  SENSATION: WFL   POSTURE: No abnormalities noted    UPPER EXTREMITY ROM:    Active / PROM ROM Right eval Left eval  Shoulder flexion 180/180 90/180  Shoulder extension      Shoulder abduction 180/180 60*/60*  Shoulder adduction      Shoulder internal rotation (combined)  Thoracic Spine  PSIS   Shoulder external rotation      Elbow flexion      Elbow extension      Wrist flexion      Wrist extension      Wrist ulnar deviation      Wrist radial deviation      Wrist pronation      Wrist supination      (Blank rows = not tested)   UPPER EXTREMITY MMT:   MMT Right eval Left eval  Shoulder flexion 5 3+*  Shoulder extension      Shoulder abduction      Shoulder adduction 5 3+*  Shoulder internal rotation      Shoulder external rotation      Middle trapezius      Lower trapezius      Elbow flexion      Elbow extension      Wrist flexion      Wrist extension      Wrist ulnar deviation      Wrist radial deviation      Wrist pronation      Wrist supination      Grip strength (lbs)      (Blank rows = not tested)       JOINT MOBILITY TESTING:  Crepitus and firm end field at 90 degrees flexion and abduction    PALPATION:  TTP around entire left shoulder joint              TODAY'S TREATMENT:                                                                                                                                         DATE:   01/29/23: Grant Fontana:  Shoulder AAROM Pulleys Flex/ Ext 3 x 10 -Pt able to flex arm to 160 degrees  Shoulder AAROM Pulleys Scaption Flex/ Ext 3 x 10  Supine Shoulder Flexion/Extension AAROM with wand 3 x 10  Supine Shoulder ER/IR at 45 degrees abduction with 3 sec hold 3 x 10 Supine Shoulder Flexion/Extension AROM 3 x 10  -min VC to maintain back contacting mat   MANUAL: Upper trap massage and trigger point release  Rhomboid massage and trigger point release    01/27/23:  Shoulder AAROM Pulleys Flex/ Ext 3 x 10 -Pt able to flex arm to 160 degrees  Shoulder AAROM Pulleys Scaption Flex/ Ext 3 x 10   Tomasa Hose PT, DPT administered treatment due to treating PT, Ellin Goodie not having certification:  Trigger Point Dry-Needling  Treatment instructions: Expect mild to  moderate muscle soreness. S/S of pneumothorax if dry needled over a lung field, and to seek immediate medical attention should they occur. Patient verbalized understanding of these instructions and education.  Patient Consent Given: Yes Education handout provided: No Muscles treated: Infraspinatus and left upper trap  Electrical stimulation performed: No Parameters: N/A Treatment response/outcome: Twitch in left infraspinatus muscle  Shoulder Flex PROM on LUE 160 degrees  Shoulder ER PROM on LUE 60 degrees Shoulder IR PROM on LUE 80 degrees  Shoulder AROM Flexion/Extension in sitting 1 x 10  Shoulder AROM Abduction/ Adduction in sitting 1 x 10   01/19/23:All exercises performed on LUE   THEREX  Shoulder AAROM Pulleys Flex/ Ext 3 x 10 -Pt able to flex arm to 160 degrees  Shoulder AAROM Pulleys Scaption Flex/ Ext 3 x 10  Inferior glide with 5 sec hold 1 x 10 Shoulder Extension Isometrics 5 sec holds in standing 1 x 10   MANUAL  Upper trap trigger point release and massage     01/15/23: All exercises performed on LUE   Shoulder AAROM Pulleys Flex/ Ext 3 x 10  Seated Rows with Blue Band 3 x 10  Shoulder AAROM External Rotation at 0 deg abduction 3 x 10  Seated Shoulder Flex AAROM with wand 2 x 10  Shoulder Rhomboid Stretch Forward Fold 30 sec  Doorway Rhomboid Stretch 2 x 30 sec   01/08/23: THEREX   LUE Shoulder Flex Isometric 5 sec hold 1 x 10  LUE Shoulder Abduction Isometric 5 sec hold 1 x 10  LUE Shoulder ER Isometric 5 sec hold 1 x 10   MANUAL  Trigger point release of left upper trap and right rhomboid     PATIENT EDUCATION: Education details: form  and technique for appropriate exercise and explanation about post rehab protocol  Person educated: Patient Education method: Explanation, Demonstration, Verbal cues, and Handouts Education comprehension: verbalized understanding, returned demonstration, and verbal cues required   HOME EXERCISE PROGRAM: Access Code: [email or web address] URL: [email or web address] Date: 01/27/2023 Prepared by: Ellin Goodie  Exercises - Seated Upper Trapezius Stretch  - 1 x daily - 3 reps - 30-60 sec hold - Seated Levator Scapulae Stretch  - 1 x daily - 3 reps - 30 sec  hold - Seated Single Arm Shoulder Row with Anchored Resistance  - 3 x weekly - 3 sets - 10 reps - Supine Shoulder External Rotation with Dowel  - 1 x daily - 3 sets - 10 reps - Isometric Shoulder Flexion  - 3 x weekly - 3 sets - 10 reps - 5 sec  hold - Isometric Shoulder Abduction at Wall  - 3 x weekly - 3 sets - 10 reps - 5 sec hold - Isometric Shoulder External Rotation  - 1 x daily - 3 sets - 10 reps - 5 sec  hold - Seated Elbow Flexion AAROM  - 1 x daily - 3 sets - 10 reps - Doorway Rhomboid Stretch  - 1 x daily - 3 reps - 30 sec  hold - Standing Isometric Shoulder Extension with Doorway  - 3 x weekly - 3 sets - 10 reps - 5 sec hold - Standing Sleeper Stretch at Wall  - 1 x daily - 3 reps - 30 sec  hold - Shoulder Abduction Towel Slide on Railing   - 1 x daily - 3 sets - 10 reps - Seated Shoulder Flexion on Railing   - 1 x daily - 3 sets - 10 reps   ASSESSMENT:  CLINICAL IMPRESSION: Pt presents 6 weeks s/p left shoulder total arthroplasty. She continues to progress with shoulder strength and ROM with ability to perform shoulder flexion/extension to 160 degrees without pain or discomfort. She continues to have scapular trigger points especially around left shoulder blade likely due to increased muscular load from arthritic shoulder. 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  OBJECTIVE IMPAIRMENTS: decreased ROM, decreased strength, impaired UE functional use, and pain.    ACTIVITY LIMITATIONS: carrying, lifting, bathing, dressing, and reach over head   PARTICIPATION LIMITATIONS: cleaning, laundry, shopping, and occupation   PERSONAL FACTORS: Age, Time since onset of injury/illness/exacerbation, and 1 comorbidity: lumber surgery  are also affecting patient's functional outcome.    REHAB POTENTIAL: Good   CLINICAL DECISION MAKING: Stable/uncomplicated   EVALUATION COMPLEXITY: Low     GOALS: Goals reviewed with patient? No   SHORT TERM GOALS: Target date: 12/03/2022   Pt will be independent with HEP in order to improve strength and balance in order to decrease fall risk and improve function at home and work. Baseline: NT  Goal status: Ongoing    2.  Patient will demonstrate knowledge of how to correctly don and doff left shoulder brace in order to be able to do it after surgery.   Baseline: Patient able to perform independently  Goal status: MET       LONG TERM GOALS: Target date: 01/28/2023   Patient will have improved function and activity level as evidenced by an increase in FOTO score by 10 points or more.  Baseline: 36/100 with target of 59  Goal status: Ongoing    2.  Patient will show symmetrical AROM between left shoulder with right shoulder to regain ability to perform reaching tasks for her job.  Baseline: Shoulder Flexion R/L: 180/90*, Shoulder Abduction R/L 180/60, Shoulder combined IR R/L Thoracic spine, PSIS  Goal status: Ongoing   3. Patient will show symmetrical strength between left shoulder with right shoulder to regain ability to perform reaching tasks for her job.  Baseline: Unable to flex or abduct LUE past 90 degrees  Goal status: Ongoing     PLAN:   PT FREQUENCY: 1-2x/week   PT DURATION: 10 weeks   PLANNED INTERVENTIONS: Therapeutic exercises, Neuromuscular  re-education, Self Care, Joint mobilization, Joint manipulation, Aquatic Therapy, Dry Needling, Electrical stimulation, Spinal manipulation, Spinal mobilization, Cryotherapy, Moist heat, Manual therapy, and Re-evaluation   PLAN FOR NEXT SESSION: Begin AROM exercises in sitting, serratus punches, shoulder abduction ball roll, supine flexion with elastic band   Ellin Goodie PT, DPT  01/29/2023, 3:01 PM

## 2023-01-29 NOTE — Addendum Note (Signed)
Addended by: Johnn Hai on: 01/29/2023 03:01 PM   Modules accepted: Orders

## 2023-01-29 NOTE — Progress Notes (Signed)
@Patient  ID: Sara Perry, female    DOB: January 13, 1961, 62 y.o.   MRN: [id number]  Chief Complaint  Patient presents with   Follow-up    Wore Cpap daily until Sara Perry had shoulder replacement surgery on 12/18/2022. Mask fits ok but it breaks her out. Pressure is ok depending on how Sara Perry is sleeping.    Referring provider: Dale Emmaus, MD  HPI: 62 year old female, never smoked.  Past medical history significant for sleep apnea, hypertension. Former patient of Dr. Nicholos Johns.   01/30/2023 Patient presents today for overdue follow-up. Needs to re-establish for OSA/CPAP management. Home sleep study in 2019 showed mild OSA, AHI 6/hour, Sara Perry did report clinical improvement with CPAP use. Pressure settings 5-15cm h20. Sara Perry has not been wearing CPAP regularly since March due to shoulder surgery. Sara Perry is typically a side sleeper. Sara Perry was struggling to sleep with sling and recliner. Sara Perry is back to sleeping in her bed. Sara Perry has worn CPAP the last 4 nights. Machine is in working order. Mask breaks her skin out. Sara Perry has tried memory foam mask. Sara Perry has tried CPAP liners. Nasal pillows do not work for her since Sara Perry is a mouth breather. DME is [employer].   Airview download 10/15/22-01/12/23 Usage days 62/90 (69%); 59 days (66%) > 4 hours Average usage 4 hours 37 mins Pressure 5-15cm h20 (11.5cm h20-95%) Airleaks 12L/min (95%) AHI 1.4  Allergies  Allergen Reactions   Diclofenac Sodium Other (See Comments)    Affected her liver function, so Sara Perry can't take this.   Voltaren [Diclofenac Sodium]     Liver problem    Immunization History  Administered Date(s) Administered   Influenza Split 07/10/2014   Influenza,inj,Quad PF,6+ Mos 07/13/2022   Influenza-Unspecified 07/18/2015, 06/23/2017, 06/20/2018, 07/20/2020, 07/13/2021   PFIZER(Purple Top)SARS-COV-2 Vaccination 12/09/2019, 01/20/2020   Tdap 11/19/2017    Past Medical History:  Diagnosis Date   Arthritis    Complication of anesthesia    pt states  Sara Perry is very sensitive to most drugs, usually needs 1/2 of whatever other people get. Sara Perry states as a child/teenager Sara Perry had difficulty waking up after surgery due to the sensitivity that Sara Perry has to drugs.   Eczema    Frequent UTI    H/O lymphadenopathy    With previous submandibular node removals.   Hypertension    Recurrent sinus infections    Sleep apnea    uses c-pap    Tobacco History: Social History   Tobacco Use  Smoking Status Never  Smokeless Tobacco Never   Counseling given: Not Answered   Outpatient Medications Prior to Visit  Medication Sig Dispense Refill   Acetaminophen Extra Strength 500 MG CAPS Take 2 tablets (1,000 mg) by mouth every 8 (eight) hours as needed. 84 capsule 0   aspirin 81 MG chewable tablet Chew 1 tablet (81 mg total) by mouth daily for 6 weeks for post-op DVT prophylaxis. 90 tablet 0   Blood Pressure Monitor MISC Check BP regularly. 1 each 0   Calcium Carb-Cholecalciferol (CALCIUM-VITAMIN D) 600-400 MG-UNIT TABS Take 1 tablet by mouth 3 (three) times a week.     celecoxib (CELEBREX) 100 MG capsule Take 1 capsule (100 mg total) by mouth 2 (two) times daily. 60 capsule 2   Cholecalciferol 25 MCG (1000 UT) capsule Take by mouth.     clobetasol (TEMOVATE) 0.05 % external solution Apply to affected scalp daily as needed for flares 50 mL 1   clobetasol (TEMOVATE) 0.05 % external solution Apply 1 Application topically to scalp daily  as needed for flares. 50 mL 1   estradiol (ESTRACE VAGINAL) 0.1 MG/GM vaginal cream Use 1 application at night for 5 nights and then use 2 times per week 42.5 g 1   fluocinonide (LIDEX) 0.05 % external solution APPLY TO AFFECTED AREA(S) AT BEDTIME (Patient taking differently: Apply 1 application  topically See admin instructions. Use once or twice a week) 60 mL 0   Glucosamine-Chondroit-Vit C-Mn (GLUCOSAMINE-CHONDROITIN) CAPS Take 1 capsule by mouth daily.     Multiple Vitamin (MULTIVITAMIN) tablet Take 1 tablet by mouth daily.  Centrum Silver 50+     Omega-3 Fatty Acids (FISH OIL) 1000 MG CAPS Take 1 capsule by mouth daily.     telmisartan (MICARDIS) 40 MG tablet Take 1 tablet (40 mg total) by mouth daily. 30 tablet 3   tretinoin (RETIN-A) 0.05 % cream Apply 1 application topically every 30 (thirty) days. Apply to affected area as needed for Acne     ondansetron (ZOFRAN) 4 MG tablet Take 1 tablet (4 mg total) by mouth every 6 (six) hours as needed for nausea / vomiting. (Patient not taking: Reported on 01/30/2023) 10 tablet 0   oxyCODONE (OXY IR/ROXICODONE) 5 MG immediate release tablet Take 1-2 tablets (5-10 mg total) by mouth every 6 (six) hours as needed no more than 6 pills a day. (Patient not taking: Reported on 01/30/2023) 30 tablet 0   TURMERIC PO Take 1 capsule by mouth daily. (Patient not taking: Reported on 01/30/2023)     No facility-administered medications prior to visit.    Review of Systems  Review of Systems  Constitutional: Negative.   HENT: Negative.    Respiratory: Negative.    Cardiovascular: Negative.    Physical Exam  BP 122/78 (BP Location: Left Arm, Cuff Size: Normal)   Pulse 84   Temp (!) 97.5 F (36.4 C)   Ht  (1.626 m)   Wt 120 lb 3.2 oz (54.5 kg)   LMP 12/01/2008   SpO2 100%   BMI 20.63 kg/m  Physical Exam Constitutional:      General: Sara Perry is not in acute distress.    Appearance: Normal appearance. Sara Perry is not ill-appearing.  HENT:     Head: Normocephalic and atraumatic.  Cardiovascular:     Rate and Rhythm: Normal rate and regular rhythm.  Pulmonary:     Effort: Pulmonary effort is normal.     Breath sounds: Normal breath sounds.  Skin:    General: Skin is warm and dry.  Neurological:     General: No focal deficit present.     Mental Status: Sara Perry is alert and oriented to person, place, and time. Mental status is at baseline.  Psychiatric:        Mood and Affect: Mood normal.        Behavior: Behavior normal.        Thought Content: Thought content normal.         Judgment: Judgment normal.      Lab Results:  CBC    Component Value Date/Time   WBC 4.7 09/30/2022 0851   RBC 3.95 09/30/2022 0851   HGB 13.0 09/30/2022 0851   HGB 13.4 12/04/2012 0928   HCT 38.4 09/30/2022 0851   HCT 40.3 12/04/2012 0928   PLT 345.0 09/30/2022 0851   PLT 260 12/04/2012 0928   MCV 97.2 09/30/2022 0851   MCV 98 12/04/2012 0928   MCH 32.0 12/26/2019 0939   MCHC 33.8 09/30/2022 0851   RDW 13.2 09/30/2022 0851  RDW 12.7 12/04/2012 0928   LYMPHSABS 1.5 09/30/2022 0851   LYMPHSABS 1.3 12/04/2012 0928   MONOABS 0.4 09/30/2022 0851   MONOABS 0.3 12/04/2012 0928   EOSABS 0.1 09/30/2022 0851   EOSABS 0.1 12/04/2012 0928   BASOSABS 0.0 09/30/2022 0851   BASOSABS 0.0 12/04/2012 0928    BMET    Component Value Date/Time   NA 142 10/16/2022 0000   K 4.7 10/16/2022 0000   CL 105 10/16/2022 0000   CO2 30 (A) 10/16/2022 0000   GLUCOSE 82 09/30/2022 0851   BUN 15 10/16/2022 0000   CREATININE 0.6 10/16/2022 0000   CREATININE 0.68 09/30/2022 0851   CALCIUM 9.9 10/16/2022 0000   GFRNONAA >60 12/26/2019 0939   GFRAA >60 12/26/2019 0939    BNP No results found for: "BNP"  ProBNP No results found for: "PROBNP"  Imaging: No results found.   Assessment & Plan:   Sleep apnea - Re-establishing care. Hx mild OSA, AHI 6/hour. Patient remains compliant with CPAP use, Sara Perry did have a temporary break in therapy in March due to shoulder surgery. Machine is working fine. Mask is breaking out her skin. Sara Perry has tried several different kinds. Pressure settings 5-15cm h20; Residual AHI 1.4/hour. We will lower max pressure settings to see if this helps with airleaks. Advised Sara Perry re-try mask liners or foam full face mask. Sara Perry can also look into getting skin protectant/gel.   Orders: DME order change CPAP pressure 5-13cm h20  Renew supplies with [employer]   Follow-up: 6 month virtual visit with Waynetta Sandy NP (you should be due for new machine in Sep/Oct 2024)   Glenford Bayley, NP 01/30/2023

## 2023-01-30 ENCOUNTER — Encounter: Payer: Self-pay | Admitting: Primary Care

## 2023-01-30 ENCOUNTER — Ambulatory Visit (INDEPENDENT_AMBULATORY_CARE_PROVIDER_SITE_OTHER): Payer: 59 | Admitting: Primary Care

## 2023-01-30 VITALS — BP 122/78 | HR 84 | Temp 97.5°F | Ht 64.0 in | Wt 120.2 lb

## 2023-01-30 DIAGNOSIS — G4730 Sleep apnea, unspecified: Secondary | ICD-10-CM

## 2023-01-30 DIAGNOSIS — G4733 Obstructive sleep apnea (adult) (pediatric): Secondary | ICD-10-CM

## 2023-01-30 NOTE — Assessment & Plan Note (Signed)
-   Re-establishing care. Hx mild OSA, AHI 6/hour. Patient remains compliant with CPAP use, she did have a temporary break in therapy in March due to shoulder surgery. Machine is working fine. Mask is breaking out her skin. She has tried several different kinds. Pressure settings 5-15cm h20; Residual AHI 1.4/hour. We will lower max pressure settings to see if this helps with airleaks. Advised she re-try mask liners or foam full face mask. She can also look into getting skin protectant/gel.   Orders: DME order change CPAP pressure 5-13cm h20  Renew supplies with [employer]   Follow-up: 6 month virtual visit with Beth NP (you should be due for new machine in Sep/Oct 2024)

## 2023-01-30 NOTE — Progress Notes (Signed)
Reviewed and agree with assessment/plan.   Coralyn Helling, MD [facility] Pulmonary/Critical Care 01/30/2023, 1:02 PM Pager:  [phone]

## 2023-01-30 NOTE — Patient Instructions (Addendum)
Sleep apnea is well controlled on currently pressure settings Lowering your max settings to see if this helps with airleaks Continue to use either mask liners or foam full face mask Look into getting skin protectant/gel for CPAP online   Orders: DME order change CPAP pressure 5-13cm h20  Renew supplies with [employer]   Follow-up: 6 month virtual visit with [facility] NP (you should be due for new machine in Sep/Oct 2024)  CPAP and BIPAP Information CPAP and BIPAP are methods that use air pressure to keep your airways open and to help you breathe well. CPAP and BIPAP use different amounts of pressure. Your health care provider will tell you whether CPAP or BIPAP would be more helpful for you. CPAP stands for "continuous positive airway pressure." With CPAP, the amount of pressure stays the same while you breathe in (inhale) and out (exhale). BIPAP stands for "bi-level positive airway pressure." With BIPAP, the amount of pressure will be higher when you inhale and lower when you exhale. This allows you to take larger breaths. CPAP or BIPAP may be used in the hospital, or your health care provider may want you to use it at home. You may need to have a sleep study before your health care provider can order a machine for you to use at home. What are the advantages? CPAP or BIPAP can be helpful if you have: Sleep apnea. Chronic obstructive pulmonary disease (COPD). Heart failure. Medical conditions that cause muscle weakness, including muscular dystrophy or amyotrophic lateral sclerosis (ALS). Other problems that cause breathing to be shallow, weak, abnormal, or difficult. CPAP and BIPAP are most commonly used for obstructive sleep apnea (OSA) to keep the airways from collapsing when the muscles relax during sleep. What are the risks? Generally, this is a safe treatment. However, problems may occur, including: Irritated skin or skin sores if the mask does not fit properly. Dry or stuffy nose or  nosebleeds. Dry mouth. Feeling gassy or bloated. Sinus or lung infection if the equipment is not cleaned properly. When should CPAP or BIPAP be used? In most cases, the mask only needs to be worn during sleep. Generally, the mask needs to be worn throughout the night and during any daytime naps. People with certain medical conditions may also need to wear the mask at other times, such as when they are awake. Follow instructions from your health care provider about when to use the machine. What happens during CPAP or BIPAP?  Both CPAP and BIPAP are provided by a small machine with a flexible plastic tube that attaches to a plastic mask that you wear. Air is blown through the mask into your nose or mouth. The amount of pressure that is used to blow the air can be adjusted on the machine. Your health care provider will set the pressure setting and help you find the best mask for you. Tips for using the mask Because the mask needs to be snug, some people feel trapped or closed-in (claustrophobic) when first using the mask. If you feel this way, you may need to get used to the mask. One way to do this is to hold the mask loosely over your nose or mouth and then gradually apply the mask more snugly. You can also gradually increase the amount of time that you use the mask. Masks are available in various types and sizes. If your mask does not fit well, talk with your health care provider about getting a different one. Some common types of masks include:  Full face masks, which fit over the mouth and nose. Nasal masks, which fit over the nose. Nasal pillow or prong masks, which fit into the nostrils. If you are using a mask that fits over your nose and you tend to breathe through your mouth, a chin strap may be applied to help keep your mouth closed. Use a skin barrier to protect your skin as told by your health care provider. Some CPAP and BIPAP machines have alarms that may sound if the mask comes off or  develops a leak. If you have trouble with the mask, it is very important that you talk with your health care provider about finding a way to make the mask easier to tolerate. Do not stop using the mask. There could be a negative impact on your health if you stop using the mask. Tips for using the machine Place your CPAP or BIPAP machine on a secure table or stand near an electrical outlet. Know where the on/off switch is on the machine. Follow instructions from your health care provider about how to set the pressure on your machine and when you should use it. Do not eat or drink while the CPAP or BIPAP machine is on. Food or fluids could get pushed into your lungs by the pressure of the CPAP or BIPAP. For home use, CPAP and BIPAP machines can be rented or purchased through home health care companies. Many different brands of machines are available. Renting a machine before purchasing may help you find out which particular machine works well for you. Your health insurance company may also decide which machine you may get. Keep the CPAP or BIPAP machine and attachments clean. Ask your health care provider for specific instructions. Check the humidifier if you have a dry stuffy nose or nosebleeds. Make sure it is working correctly. Follow these instructions at home: Take over-the-counter and prescription medicines only as told by your health care provider. Ask if you can take sinus medicine if your sinuses are blocked. Do not use any products that contain nicotine or tobacco. These products include cigarettes, chewing tobacco, and vaping devices, such as e-cigarettes. If you need help quitting, ask your health care provider. Keep all follow-up visits. This is important. Contact a health care provider if: You have redness or pressure sores on your head, face, mouth, or nose from the mask or head gear. You have trouble using the CPAP or BIPAP machine. You cannot tolerate wearing the CPAP or BIPAP  mask. Someone tells you that you snore even when wearing your CPAP or BIPAP. Get help right away if: You have trouble breathing. You feel confused. Summary CPAP and BIPAP are methods that use air pressure to keep your airways open and to help you breathe well. If you have trouble with the mask, it is very important that you talk with your health care provider about finding a way to make the mask easier to tolerate. Do not stop using the mask. There could be a negative impact to your health if you stop using the mask. Follow instructions from your health care provider about when to use the machine. This information is not intended to replace advice given to you by your health care provider. Make sure you discuss any questions you have with your health care provider. Document Revised: 05/08/2021 Document Reviewed: 09/07/2020 Elsevier Patient Education  2023 [employer].

## 2023-02-03 ENCOUNTER — Ambulatory Visit: Payer: 59 | Admitting: Physical Therapy

## 2023-02-03 DIAGNOSIS — M25512 Pain in left shoulder: Secondary | ICD-10-CM | POA: Diagnosis not present

## 2023-02-03 DIAGNOSIS — M19012 Primary osteoarthritis, left shoulder: Secondary | ICD-10-CM | POA: Diagnosis not present

## 2023-02-03 DIAGNOSIS — G8929 Other chronic pain: Secondary | ICD-10-CM | POA: Diagnosis not present

## 2023-02-03 NOTE — Therapy (Signed)
OUTPATIENT PHYSICAL THERAPY TREATMENT NOTE   Patient Name: Sara Perry MRN: [id number] DOB:February 02, 1961, 62 y.o., female Today's Date: 02/03/2023  PCP: Dr. Dale [place]  REFERRING PROVIDER: Dr. Ramond Marrow   END OF SESSION:   PT End of Session - 02/03/23 1632     Visit Number 9    Number of Visits 20    Date for PT Re-Evaluation 01/27/23    Authorization Type [facility] [employer] [id number]    Authorization Time Period 01/28/23-03/30/23    Authorization - Visit Number 9    Authorization - Number of Visits 20    Progress Note Due on Visit 10    PT Start Time 1545    PT Stop Time 1630    PT Time Calculation (min) 45 min    Activity Tolerance Patient tolerated treatment well    Behavior During Therapy WFL for tasks assessed/performed                  Past Medical History:  Diagnosis Date   Arthritis    Complication of anesthesia    pt states she is very sensitive to most drugs, usually needs 1/2 of whatever other people get. She states as a child/teenager she had difficulty waking up after surgery due to the sensitivity that she has to drugs.   Eczema    Frequent UTI    H/O lymphadenopathy    With previous submandibular node removals.   Hypertension    Recurrent sinus infections    Sleep apnea    uses c-pap   Past Surgical History:  Procedure Laterality Date   COLONOSCOPY WITH PROPOFOL N/A 08/25/2017   Procedure: COLONOSCOPY WITH PROPOFOL;  Surgeon: Midge Minium, MD;  Location: [facility];  Service: Endoscopy;  Laterality: N/A;   COLONOSCOPY WITH PROPOFOL N/A 10/17/2022   Procedure: COLONOSCOPY WITH PROPOFOL;  Surgeon: Midge Minium, MD;  Location: [facility];  Service: Endoscopy;  Laterality: N/A;   LUMBAR FUSION  12/26/2019   l4-l5   Lymph Node Removal  1984   NECK SURGERY  1977   H/O Right neck surgery secondary to a benign growth with when patient was 39 or 62 yo   skin lesion extraction  2013   basal cell - Dr Cheree Ditto   TOTAL SHOULDER ARTHROPLASTY  Left 12/18/2022   Procedure: TOTAL SHOULDER ARTHROPLASTY;  Surgeon: Bjorn Pippin, MD;  Location: [facility];  Service: Orthopedics;  Laterality: Left;   Uterine Polyps Removed  2004   Patient Active Problem List   Diagnosis Date Noted   Pre-op evaluation 11/16/2022   History of colonic polyps 10/17/2022   Abdominal bruit 09/27/2021   Osteoarthritis 05/30/2021   Ganglion cyst 01/20/2021   Urinary frequency 09/17/2020   Sleep apnea 06/20/2020   Thumb pain, right 06/20/2020   Lumbar stenosis with neurogenic claudication 12/26/2019   Hyperbilirubinemia 12/14/2019   Eye injury 05/26/2019   Left shoulder pain 05/23/2018   Snoring 05/23/2018   Benign neoplasm of descending colon    Benign neoplasm of ascending colon    Vaginal atrophy 03/22/2017   Meniere's disease 08/17/2016   Colon cancer screening 08/11/2015   Fatigue 06/03/2015   Health care maintenance 06/03/2015   Neck pain 02/05/2014   Hypercholesterolemia 02/05/2014   Back pain 12/05/2012   Trigger finger 12/05/2012   Essential hypertension, benign 12/05/2012    REFERRING DIAG: Left shoulder pain, left shoulder osteoarthritis   THERAPY DIAG:  Chronic left shoulder pain  Primary osteoarthritis of left shoulder  Rationale for  Evaluation and Treatment Rehabilitation  PERTINENT HISTORY: Pt reports that she is having left total shoulder surgery on February 22nd. It is a same day surgery. She did PT 4 years ago for her left shoulder and she has also tried PRP and gel injections, but these did not help with her shoulder pain. She has had prior lumbar spine surgery several years ago, and since then she has used arms more rather than rotate, which is why she thinks it caused overuse in left shoulder. She here clicking and popping in shoulder when she reaches above her head followed by pain. She states that she brought her post surgical shoulder brace in and she would like to know more about how to wear it correctly and  what she can and cannot do post surgery like whether she can use her left hand.   PRECAUTIONS: None   SUBJECTIVE:                                                                                                                                                                                      SUBJECTIVE STATEMENT: Pt reports that she received a massage recently that really helped her shoulder and back muscular tension.  PAIN:  Are you having pain? No   OBJECTIVE: (objective measures completed at initial evaluation unless otherwise dated)   DIAGNOSTIC FINDINGS:  CLINICAL DATA:  Chronic left shoulder pain.   EXAM: CT OF THE UPPER LEFT EXTREMITY WITHOUT CONTRAST   TECHNIQUE: Multidetector CT imaging of the upper left extremity was performed according to the standard protocol.   RADIATION DOSE REDUCTION: This exam was performed according to the departmental dose-optimization program which includes automated exposure control, adjustment of the mA and/or kV according to patient size and/or use of iterative reconstruction technique.   COMPARISON:  Left shoulder x-rays dated May 20, 2018.   FINDINGS: Bones/Joint/Cartilage   No fracture or dislocation.   Severe osteoarthritis of the glenohumeral joint with severe joint space narrowing, bone-on-bone appearance, subchondral sclerosis, subchondral cystic changes and marginal osteophytosis. Moderate joint effusion.   Normal acromioclavicular joint.   Ligaments   Ligaments are suboptimally evaluated by CT.   Muscles and Tendons Grossly intact.  No muscle atrophy.   Soft tissue No fluid collection or hematoma. No soft tissue mass. The visualized left lung is clear.   IMPRESSION: 1. Severe left glenohumeral osteoarthritis.     Electronically Signed   By: Obie Dredge M.D.   On: 11/04/2022 09:32   PATIENT SURVEYS:  FOTO 36/100 with target of 59    COGNITION: Overall cognitive status: Within functional limits for  tasks assessed  SENSATION: WFL   POSTURE: No abnormalities noted    UPPER EXTREMITY ROM:    Active / PROM ROM Right eval Left eval  Shoulder flexion 180/180 90/180  Shoulder extension      Shoulder abduction 180/180 60*/60*  Shoulder adduction      Shoulder internal rotation (combined)  Thoracic Spine  PSIS   Shoulder external rotation      Elbow flexion      Elbow extension      Wrist flexion      Wrist extension      Wrist ulnar deviation      Wrist radial deviation      Wrist pronation      Wrist supination      (Blank rows = not tested)   UPPER EXTREMITY MMT:   MMT Right eval Left eval  Shoulder flexion 5 3+*  Shoulder extension      Shoulder abduction      Shoulder adduction 5 3+*  Shoulder internal rotation      Shoulder external rotation      Middle trapezius      Lower trapezius      Elbow flexion      Elbow extension      Wrist flexion      Wrist extension      Wrist ulnar deviation      Wrist radial deviation      Wrist pronation      Wrist supination      Grip strength (lbs)      (Blank rows = not tested)       JOINT MOBILITY TESTING:  Crepitus and firm end field at 90 degrees flexion and abduction    PALPATION:  TTP around entire left shoulder joint              TODAY'S TREATMENT:                                                                                                                                         DATE:   02/03/23: UBE seat at 8 for 5 min  Seated Rows with Black Band 1 x 10  Seated Rows with Gray Band 1 x 10  Seated on Incline Bolster AAROM ER in scaption plane 3 x 10 Supine Biceps Stretch on LUE 3 x 30 sec  Shoulder Extension to neutral AROM on counter 2 x 10    -mod VC for sequence of exercise   01/29/23: THEREX:  Shoulder AAROM Pulleys Flex/ Ext 3 x 10 -Pt able to flex arm to 160 degrees  Shoulder AAROM Pulleys Scaption Flex/ Ext 3 x 10  Supine Shoulder Flexion/Extension AAROM  with wand 3 x 10  Supine Shoulder ER/IR at 45 degrees abduction with 3 sec hold 3 x 10 Supine Shoulder Flexion/Extension AROM 3 x 10  -min VC to maintain back contacting mat   MANUAL: Upper  trap massage and trigger point release  Rhomboid massage and trigger point release   01/27/23:  Shoulder AAROM Pulleys Flex/ Ext 3 x 10 -Pt able to flex arm to 160 degrees  Shoulder AAROM Pulleys Scaption Flex/ Ext 3 x 10   Tomasa Hose PT, DPT administered treatment due to treating PT, Ellin Goodie not having certification:  Trigger Point Dry-Needling  Treatment instructions: Expect mild to moderate muscle soreness. S/S of pneumothorax if dry needled over a lung field, and to seek immediate medical attention should they occur. Patient verbalized understanding of these instructions and education.  Patient Consent Given: Yes Education handout provided: No Muscles treated: Infraspinatus and left upper trap  Electrical stimulation performed: No Parameters: N/A Treatment response/outcome: Twitch in left infraspinatus muscle  Shoulder Flex PROM on LUE 160 degrees  Shoulder ER PROM on LUE 60 degrees Shoulder IR PROM on LUE 80 degrees  Shoulder AROM Flexion/Extension in sitting 1 x 10  Shoulder AROM Abduction/ Adduction in sitting 1 x 10   01/19/23:All exercises performed on LUE   THEREX  Shoulder AAROM Pulleys Flex/ Ext 3 x 10 -Pt able to flex arm to 160 degrees  Shoulder AAROM Pulleys Scaption Flex/ Ext 3 x 10  Inferior glide with 5 sec hold 1 x 10 Shoulder Extension Isometrics 5 sec holds in standing 1 x 10   MANUAL  Upper trap trigger point release and massage     01/15/23: All exercises performed on LUE   Shoulder AAROM Pulleys Flex/ Ext 3 x 10  Seated Rows with Blue Band 3 x 10  Shoulder AAROM External Rotation at 0 deg abduction 3 x 10  Seated Shoulder Flex AAROM with wand 2 x 10  Shoulder Rhomboid Stretch Forward Fold 30 sec  Doorway Rhomboid Stretch 2 x 30 sec    01/08/23: THEREX   LUE Shoulder Flex Isometric 5 sec hold 1 x 10  LUE Shoulder Abduction Isometric 5 sec hold 1 x 10  LUE Shoulder ER Isometric 5 sec hold 1 x 10   MANUAL  Trigger point release of left upper trap and right rhomboid     PATIENT EDUCATION: Education details: form and technique for appropriate exercise and explanation about post rehab protocol  Person educated: Patient Education method: Explanation, Demonstration, Verbal cues, and Handouts Education comprehension: verbalized understanding, returned demonstration, and verbal cues required   HOME EXERCISE PROGRAM: Access Code: [email or web address] URL: [email or web address] Date: 02/03/2023 Prepared by: Ellin Goodie  Exercises - Seated Upper Trapezius Stretch  - 1 x daily - 3 reps - 30-60 sec hold - Seated Levator Scapulae Stretch  - 1 x daily - 3 reps - 30 sec  hold - Seated Single Arm Shoulder Row with Anchored Resistance  - 3 x weekly - 3 sets - 10 reps - Isometric Shoulder Abduction at Wall  - 3 x weekly - 3 sets - 10 reps - 5 sec hold - Isometric Shoulder External Rotation  - 3 x weekly - 3 sets - 10 reps - 5 sec  hold - Doorway Rhomboid Stretch  - 1 x daily - 3 reps - 30 sec  hold - Standing Sleeper Stretch at [employer]  - 1 x daily - 3 reps - 30 sec  hold - Shoulder Abduction Towel Slide on Railing   - 1 x daily - 3 sets - 10 reps - Supine Shoulder External Rotation in 45 Degrees Abduction AAROM with Dowel  - 1 x daily - 3 sets - 10 reps - 3 sec hold  hold - Supine Shoulder Flexion Extension Full Range AROM  - 3 x weekly - 3 sets - 10 reps - Prone Single Arm Shoulder Extension on Swiss Ball  - 3 x weekly - 3 sets - 10 reps   ASSESSMENT:   CLINICAL IMPRESSION: Pt presents 7 weeks s/p left shoulder total arthroplasty. She shows improvement in left shoulder AROM and parascapular strength with completion of rows with increased resistance and with ability to perform shoulder extension AROM. 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   OBJECTIVE IMPAIRMENTS: decreased ROM, decreased strength, impaired UE functional use, and pain.    ACTIVITY LIMITATIONS: carrying, lifting, bathing, dressing, and reach over head   PARTICIPATION LIMITATIONS: cleaning, laundry, shopping, and occupation   PERSONAL FACTORS: Age, Time since onset of injury/illness/exacerbation, and 1 comorbidity: lumber surgery  are also affecting patient's functional outcome.    REHAB POTENTIAL: Good   CLINICAL DECISION MAKING: Stable/uncomplicated   EVALUATION COMPLEXITY: Low     GOALS: Goals reviewed with patient? No   SHORT TERM GOALS: Target date: 12/03/2022   Pt will be independent with HEP in order to improve strength and balance in order to decrease fall risk and improve function at home and work. Baseline: NT  Goal status: Ongoing    2.  Patient will demonstrate knowledge of how to correctly don and doff left shoulder brace in order to be able to do it after surgery.   Baseline: Patient able to perform independently  Goal status: MET       LONG TERM GOALS: Target date: 01/28/2023   Patient will have improved function and activity level as evidenced by an increase in FOTO score by 10 points or more.  Baseline: 36/100 with target of 59  Goal status: Ongoing    2.  Patient will show symmetrical AROM between left shoulder with right shoulder to regain ability to perform reaching tasks for her job.  Baseline: Shoulder Flexion R/L: 180/90*, Shoulder Abduction R/L 180/60, Shoulder combined IR R/L Thoracic spine, PSIS  Goal status: Ongoing   3. Patient will show symmetrical strength between left shoulder with right shoulder to regain ability to perform reaching tasks for her job.  Baseline: Unable to flex or abduct LUE past 90 degrees  Goal status: Ongoing     PLAN:   PT FREQUENCY: 1-2x/week   PT  DURATION: 10 weeks   PLANNED INTERVENTIONS: Therapeutic exercises, Neuromuscular re-education, Self Care, Joint mobilization, Joint manipulation, Aquatic Therapy, Dry Needling, Electrical stimulation, Spinal manipulation, Spinal mobilization, Cryotherapy, Moist heat, Manual therapy, and Re-evaluation   PLAN FOR NEXT SESSION: Reassess goals. Begin AROM exercises in sitting, serratus punches, shoulder abduction ball roll, supine flexion with elastic band   Ellin Goodie PT, DPT  02/03/2023, 4:35 PM

## 2023-02-05 ENCOUNTER — Encounter: Payer: Self-pay | Admitting: Physical Therapy

## 2023-02-05 ENCOUNTER — Ambulatory Visit: Payer: 59 | Admitting: Physical Therapy

## 2023-02-05 DIAGNOSIS — G8929 Other chronic pain: Secondary | ICD-10-CM | POA: Diagnosis not present

## 2023-02-05 DIAGNOSIS — M19012 Primary osteoarthritis, left shoulder: Secondary | ICD-10-CM

## 2023-02-05 DIAGNOSIS — M25512 Pain in left shoulder: Secondary | ICD-10-CM | POA: Diagnosis not present

## 2023-02-05 NOTE — Therapy (Addendum)
OUTPATIENT PHYSICAL THERAPY PROGRESS NOTE   Patient Name: Sara Perry MRN: [id number] DOB:[birth year], 62 y.o., female Today's Date: 02/05/2023  PCP: Dr. Dale   REFERRING PROVIDER: Dr. Ramond Marrow   END OF SESSION:   PT End of Session - 02/05/23 0936     Visit Number 10    Number of Visits 20    Date for PT Re-Evaluation 03/30/23    Authorization Type [facility] [employer] [id number]    Authorization Time Period 01/28/23-03/30/23    Authorization - Visit Number 10    Authorization - Number of Visits 20    Progress Note Due on Visit 10    PT Start Time 0933    PT Stop Time 1015    PT Time Calculation (min) 42 min    Activity Tolerance Patient tolerated treatment well    Behavior During Therapy WFL for tasks assessed/performed                   Past Medical History:  Diagnosis Date   Arthritis    Complication of anesthesia    pt states she is very sensitive to most drugs, usually needs 1/2 of whatever other people get. She states as a child/teenager she had difficulty waking up after surgery due to the sensitivity that she has to drugs.   Eczema    Frequent UTI    H/O lymphadenopathy    With previous submandibular node removals.   Hypertension    Recurrent sinus infections    Sleep apnea    uses c-pap   Past Surgical History:  Procedure Laterality Date   COLONOSCOPY WITH PROPOFOL N/A 08/25/2017   Procedure: COLONOSCOPY WITH PROPOFOL;  Surgeon: Midge Minium, MD;  Location: [facility];  Service: Endoscopy;  Laterality: N/A;   COLONOSCOPY WITH PROPOFOL N/A 10/17/2022   Procedure: COLONOSCOPY WITH PROPOFOL;  Surgeon: Midge Minium, MD;  Location: [facility];  Service: Endoscopy;  Laterality: N/A;   LUMBAR FUSION  12/26/2019   l4-l5   Lymph Node Removal  1984   NECK SURGERY  1977   H/O Right neck surgery secondary to a benign growth with when patient was 72 or 62 yo   skin lesion extraction  2013   basal cell - Dr Cheree Ditto   TOTAL SHOULDER ARTHROPLASTY  Left 12/18/2022   Procedure: TOTAL SHOULDER ARTHROPLASTY;  Surgeon: Bjorn Pippin, MD;  Location: [facility];  Service: Orthopedics;  Laterality: Left;   Uterine Polyps Removed  2004   Patient Active Problem List   Diagnosis Date Noted   Pre-op evaluation 11/16/2022   History of colonic polyps 10/17/2022   Abdominal bruit 09/27/2021   Osteoarthritis 05/30/2021   Ganglion cyst 01/20/2021   Urinary frequency 09/17/2020   Sleep apnea 06/20/2020   Thumb pain, right 06/20/2020   Lumbar stenosis with neurogenic claudication 12/26/2019   Hyperbilirubinemia 12/14/2019   Eye injury 05/26/2019   Left shoulder pain 05/23/2018   Snoring 05/23/2018   Benign neoplasm of descending colon    Benign neoplasm of ascending colon    Vaginal atrophy 03/22/2017   Meniere's disease 08/17/2016   Colon cancer screening 08/11/2015   Fatigue 06/03/2015   Health care maintenance 06/03/2015   Neck pain 02/05/2014   Hypercholesterolemia 02/05/2014   Back pain 12/05/2012   Trigger finger 12/05/2012   Essential hypertension, benign 12/05/2012    REFERRING DIAG: Left shoulder pain, left shoulder osteoarthritis   THERAPY DIAG:  Chronic left shoulder pain  Primary osteoarthritis of left shoulder  Rationale  for Evaluation and Treatment Rehabilitation  PERTINENT HISTORY: Pt reports that she is having left total shoulder surgery on February 22nd. It is a same day surgery. She did PT 4 years ago for her left shoulder and she has also tried PRP and gel injections, but these did not help with her shoulder pain. She has had prior lumbar spine surgery several years ago, and since then she has used arms more rather than rotate, which is why she thinks it caused overuse in left shoulder. She here clicking and popping in shoulder when she reaches above her head followed by pain. She states that she brought her post surgical shoulder brace in and she would like to know more about how to wear it correctly and  what she can and cannot do post surgery like whether she can use her left hand.   PRECAUTIONS: None   SUBJECTIVE:                                                                                                                                                                                      SUBJECTIVE STATEMENT: Pt states that she completed two exercises classes in a row: one the cycling and then total body strengthening class. She feels that her shoulder is improving but still feels weaker because she cannot do as many of the UE exercise reps. Pt reports not eating this morning because she did not have time.     PAIN:  Are you having pain? No   OBJECTIVE: (objective measures completed at initial evaluation unless otherwise dated)   DIAGNOSTIC FINDINGS:  CLINICAL DATA:  Chronic left shoulder pain.   EXAM: CT OF THE UPPER LEFT EXTREMITY WITHOUT CONTRAST   TECHNIQUE: Multidetector CT imaging of the upper left extremity was performed according to the standard protocol.   RADIATION DOSE REDUCTION: This exam was performed according to the departmental dose-optimization program which includes automated exposure control, adjustment of the mA and/or kV according to patient size and/or use of iterative reconstruction technique.   COMPARISON:  Left shoulder x-rays dated May 20, 2018.   FINDINGS: Bones/Joint/Cartilage   No fracture or dislocation.   Severe osteoarthritis of the glenohumeral joint with severe joint space narrowing, bone-on-bone appearance, subchondral sclerosis, subchondral cystic changes and marginal osteophytosis. Moderate joint effusion.   Normal acromioclavicular joint.   Ligaments   Ligaments are suboptimally evaluated by CT.   Muscles and Tendons Grossly intact.  No muscle atrophy.   Soft tissue No fluid collection or hematoma. No soft tissue mass. The visualized left lung is clear.   IMPRESSION: 1. Severe left glenohumeral osteoarthritis.      Electronically Signed   By:  Obie Dredge M.D.   On: 11/04/2022 09:32   PATIENT SURVEYS:  FOTO 36/100 with target of 59    COGNITION: Overall cognitive status: Within functional limits for tasks assessed                                  SENSATION: WFL   POSTURE: No abnormalities noted    UPPER EXTREMITY ROM:    Active / PROM ROM Right eval Left eval 02/05/23 Right  02/05/23 Left   Shoulder flexion 180/180 90/180 180/180 160/150  Shoulder extension        Shoulder abduction 180/180 60*/60* 180/180 90*/160*  Shoulder adduction        Shoulder internal rotation (combined)  Thoracic Spine  PSIS     Shoulder external rotation     Occiput Occiput   Elbow flexion        Elbow extension        Wrist flexion        Wrist extension        Wrist ulnar deviation        Wrist radial deviation        Wrist pronation        Wrist supination        (Blank rows = not tested)   UPPER EXTREMITY MMT:   MMT Right eval Left eval Right 02/05/23 Left  02/05/23  Shoulder flexion 5 3+* 4+ 4  Shoulder extension        Shoulder abduction  5 3+  4 4-  Shoulder adduction 5 3+*    Shoulder internal rotation     4 4-  Shoulder external rotation     4 4-  Middle trapezius     4- 3+  Lower trapezius     4- 3-  Elbow flexion        Elbow extension        Wrist flexion        Wrist extension        Wrist ulnar deviation        Wrist radial deviation        Wrist pronation        Wrist supination        Grip strength (lbs)        (Blank rows = not tested)       JOINT MOBILITY TESTING:  Crepitus and firm end field at 90 degrees flexion and abduction    PALPATION:  TTP around entire left shoulder joint              TODAY'S TREATMENT:                                                                                                                                         DATE:   02/05/23: UBE seat at  9 for 5 min (2.5 min forward and 2.5 min backward)  See ROM measurements above   FOTO: 49/100  Shoulder MMT  Shoulder Flex R/L 4+/4 Shoulder Abduction R/L 4/4- Mid Trap R/L 4-/3+ Shoulder Ext R/L 4-/3+ Shoulder Lower Trap R/L 4-/3- Shoulder ER at 90 degrees R/L 4/4- Shoulder IR at 90 degrees R/L 4/3+ Standing Shoulder Abduction on LUE only able to achieve 90 deg AROM 1 x 10 Supine Shoulder Abduction on LUE able to achieve 160 degrees AROM 1 x 10    02/03/23: UBE seat at 8 for 5 min  Seated Rows with Black Band 1 x 10  Seated Rows with Gray Band 1 x 10  Seated on Incline Bolster AAROM ER in scaption plane 3 x 10 Supine Biceps Stretch on LUE 3 x 30 sec  Shoulder Extension to neutral AROM on counter 2 x 10    -mod VC for sequence of exercise   01/29/23: THEREX:  Shoulder AAROM Pulleys Flex/ Ext 3 x 10 -Pt able to flex arm to 160 degrees  Shoulder AAROM Pulleys Scaption Flex/ Ext 3 x 10  Supine Shoulder Flexion/Extension AAROM with wand 3 x 10  Supine Shoulder ER/IR at 45 degrees abduction with 3 sec hold 3 x 10 Supine Shoulder Flexion/Extension AROM 3 x 10  -min VC to maintain back contacting mat   MANUAL: Upper trap massage and trigger point release  Rhomboid massage and trigger point release   01/27/23:  Shoulder AAROM Pulleys Flex/ Ext 3 x 10 -Pt able to flex arm to 160 degrees  Shoulder AAROM Pulleys Scaption Flex/ Ext 3 x 10   Tomasa Hose PT, DPT administered treatment due to treating PT, Ellin Goodie not having certification:  Trigger Point Dry-Needling  Treatment instructions: Expect mild to moderate muscle soreness. S/S of pneumothorax if dry needled over a lung field, and to seek immediate medical attention should they occur. Patient verbalized understanding of these instructions and education.  Patient Consent Given: Yes Education handout provided: No Muscles treated: Infraspinatus and left upper trap  Electrical stimulation performed: No Parameters: N/A Treatment response/outcome: Twitch in left infraspinatus muscle  Shoulder Flex  PROM on LUE 160 degrees  Shoulder ER PROM on LUE 60 degrees Shoulder IR PROM on LUE 80 degrees  Shoulder AROM Flexion/Extension in sitting 1 x 10  Shoulder AROM Abduction/ Adduction in sitting 1 x 10     PATIENT EDUCATION: Education details: form and technique for appropriate exercise and explanation about post rehab protocol  Person educated: Patient Education method: Explanation, Demonstration, Verbal cues, and Handouts Education comprehension: verbalized understanding, returned demonstration, and verbal cues required   HOME EXERCISE PROGRAM: Access Code: [email or web address] URL: [email or web address] Date: 02/03/2023 Prepared by: Ellin Goodie  Exercises - Seated Upper Trapezius Stretch  - 1 x daily - 3 reps - 30-60 sec hold - Seated Levator Scapulae Stretch  - 1 x daily - 3 reps - 30 sec  hold - Seated Single Arm Shoulder Row with Anchored Resistance  - 3 x weekly - 3 sets - 10 reps - Isometric Shoulder Abduction at Wall  - 3 x weekly - 3 sets - 10 reps - 5 sec hold - Isometric Shoulder External Rotation  - 3 x weekly - 3 sets - 10 reps - 5 sec  hold - Doorway Rhomboid Stretch  - 1 x daily - 3 reps - 30 sec  hold - Standing Sleeper Stretch at Wall  - 1 x daily - 3 reps - 30 sec  hold -  Shoulder Abduction Towel Slide on Railing   - 1 x daily - 3 sets - 10 reps - Supine Shoulder External Rotation in 45 Degrees Abduction AAROM with Dowel  - 1 x daily - 3 sets - 10 reps - 3 sec hold hold - Supine Shoulder Flexion Extension Full Range AROM  - 3 x weekly - 3 sets - 10 reps - Prone Single Arm Shoulder Extension on Swiss Ball  - 3 x weekly - 3 sets - 10 reps   ASSESSMENT:   CLINICAL IMPRESSION:  Pt presents 7 weeks s/p left shoulder total arthroplasty. Pt shows improvement with left shoulder strength, mobility, and perception of UE function. 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   OBJECTIVE IMPAIRMENTS: decreased ROM, decreased strength, impaired UE functional use, and pain.    ACTIVITY LIMITATIONS: carrying, lifting, bathing, dressing, and reach over head   PARTICIPATION LIMITATIONS: cleaning, laundry, shopping, and occupation   PERSONAL FACTORS: Age, Time since onset of injury/illness/exacerbation, and 1 comorbidity: lumber surgery  are also affecting patient's functional outcome.    REHAB POTENTIAL: Good   CLINICAL DECISION MAKING: Stable/uncomplicated   EVALUATION COMPLEXITY: Low     GOALS: Goals reviewed with patient? No   SHORT TERM GOALS: Target date: 12/03/2022   Pt will be independent with HEP in order to improve strength and balance in order to decrease fall risk and improve function at home and work. Baseline: Not able to do 02/05/23: Performing independently   Goal status: Ongoing    2.  Patient will demonstrate knowledge of how to correctly don and doff left shoulder brace in order to be able to do it after surgery.   Baseline: Patient able to perform independently  Goal status: MET       LONG TERM GOALS: Target date: 01/28/2023   Patient will have improved function and activity level as evidenced by an increase in FOTO score by 10 points or more.  Baseline: 36/100 with target of 59 02/05/23: 49/100 with target 59 Goal status: Achieved    2.  Patient will show symmetrical AROM between left shoulder with right shoulder to regain ability to perform reaching tasks for her job.  Baseline: Shoulder Flexion R/L: 180/90*, Shoulder Abduction R/L 180/60, Shoulder combined IR R/L Thoracic spine, PSIS 02/05/23: See ROM measurements above  Goal status: Ongoing   3. Patient will show symmetrical strength between left shoulder with right shoulder to regain ability to perform reaching tasks for her job.  Baseline: Unable to flex or abduct LUE past 90 degrees  02/05/23: Still limited with shoulder abduction and IR   Goal status: Ongoing     PLAN:   PT FREQUENCY: 1-2x/week   PT DURATION: 10 weeks   PLANNED INTERVENTIONS: Therapeutic exercises, Neuromuscular re-education, Self Care, Joint mobilization, Joint manipulation, Aquatic Therapy, Dry Needling, Electrical stimulation, Spinal manipulation, Spinal mobilization, Cryotherapy, Moist heat, Manual therapy, and Re-evaluation   PLAN FOR NEXT SESSION: Begin AROM exercises in sitting, serratus punches, shoulder abduction ball roll, supine flexion with elastic band   Ellin Goodie PT, DPT  02/05/2023, 12:00 PM

## 2023-02-09 ENCOUNTER — Encounter: Payer: Self-pay | Admitting: Physical Therapy

## 2023-02-09 ENCOUNTER — Ambulatory Visit: Payer: 59 | Admitting: Physical Therapy

## 2023-02-09 DIAGNOSIS — M19012 Primary osteoarthritis, left shoulder: Secondary | ICD-10-CM | POA: Diagnosis not present

## 2023-02-09 DIAGNOSIS — M25512 Pain in left shoulder: Secondary | ICD-10-CM | POA: Diagnosis not present

## 2023-02-09 DIAGNOSIS — G8929 Other chronic pain: Secondary | ICD-10-CM

## 2023-02-09 NOTE — Therapy (Signed)
OUTPATIENT PHYSICAL THERAPY PROGRESS NOTE   Patient Name: Sara Perry MRN: [id number] DOB:November 05, 1960, 62 y.o., female Today's Date: 02/09/2023  PCP: Dr. Dale [place]  REFERRING PROVIDER: Dr. Ramond Marrow   END OF SESSION:   PT End of Session - 02/09/23 1152     Visit Number 11    Number of Visits 20    Date for PT Re-Evaluation 03/30/23    Authorization Type [facility] [employer] [id number]    Authorization Time Period 01/28/23-03/30/23    Authorization - Number of Visits 20    Progress Note Due on Visit 10    PT Start Time 1150    PT Stop Time 1230    PT Time Calculation (min) 40 min    Activity Tolerance Patient tolerated treatment well    Behavior During Therapy WFL for tasks assessed/performed                   Past Medical History:  Diagnosis Date   Arthritis    Complication of anesthesia    pt states she is very sensitive to most drugs, usually needs 1/2 of whatever other people get. She states as a child/teenager she had difficulty waking up after surgery due to the sensitivity that she has to drugs.   Eczema    Frequent UTI    H/O lymphadenopathy    With previous submandibular node removals.   Hypertension    Recurrent sinus infections    Sleep apnea    uses c-pap   Past Surgical History:  Procedure Laterality Date   COLONOSCOPY WITH PROPOFOL N/A 08/25/2017   Procedure: COLONOSCOPY WITH PROPOFOL;  Surgeon: Midge Minium, MD;  Location: [facility] END[facility];  Service: Endoscopy;  Laterality: N/A;   COLONOSCOPY WITH PROPOFOL N/A 10/17/2022   Procedure: COLONOSCOPY WITH PROPOFOL;  Surgeon: Midge Minium, MD;  [facility]: [facility];  Service: Endoscopy;  Laterality: N/A;   LUMBAR FUSION  12/26/2019   l4-l5   Lymph Node Removal  1984   NECK SURGERY  1977   H/O Right neck surgery secondary to a benign growth with when patient was 18 or 62 yo   skin lesion extraction  2013   basal cell - Dr Cheree Ditto   TOTAL SHOULDER ARTHROPLASTY Left 12/18/2022   Procedure: TOTAL  SHOULDER ARTHROPLASTY;  Surgeon: Bjorn Pippin, MD;  Location: [facility];  Service: Orthopedics;  Laterality: Left;   Uterine Polyps Removed  2004   Patient Active Problem List   Diagnosis Date Noted   Pre-op evaluation 11/16/2022   History of colonic polyps 10/17/2022   Abdominal bruit 09/27/2021   Osteoarthritis 05/30/2021   Ganglion cyst 01/20/2021   Urinary frequency 09/17/2020   Sleep apnea 06/20/2020   Thumb pain, right 06/20/2020   Lumbar stenosis with neurogenic claudication 12/26/2019   Hyperbilirubinemia 12/14/2019   Eye injury 05/26/2019   Left shoulder pain 05/23/2018   Snoring 05/23/2018   Benign neoplasm of descending colon    Benign neoplasm of ascending colon    Vaginal atrophy 03/22/2017   Meniere's disease 08/17/2016   Colon cancer screening 08/11/2015   Fatigue 06/03/2015   Health care maintenance 06/03/2015   Neck pain 02/05/2014   Hypercholesterolemia 02/05/2014   Back pain 12/05/2012   Trigger finger 12/05/2012   Essential hypertension, benign 12/05/2012    REFERRING DIAG: Left shoulder pain, left shoulder osteoarthritis   THERAPY DIAG:  Chronic left shoulder pain  Primary osteoarthritis of left shoulder  Rationale for Evaluation and Treatment Rehabilitation  PERTINENT HISTORY:  Pt reports that she is having left total shoulder surgery on February 22nd. It is a same day surgery. She did PT 4 years ago for her left shoulder and she has also tried PRP and gel injections, but these did not help with her shoulder pain. She has had prior lumbar spine surgery several years ago, and since then she has used arms more rather than rotate, which is why she thinks it caused overuse in left shoulder. She here clicking and popping in shoulder when she reaches above her head followed by pain. She states that she brought her post surgical shoulder brace in and she would like to know more about how to wear it correctly and what she can and cannot do post  surgery like whether she can use her left hand.   PRECAUTIONS: None   SUBJECTIVE:                                                                                                                                                                                      SUBJECTIVE STATEMENT: Pt states that she feels increased pain in her left biceps from increased activity but otherwise exercises have been going well.   PAIN:  Are you having pain? No   OBJECTIVE: (objective measures completed at initial evaluation unless otherwise dated)   DIAGNOSTIC FINDINGS:  CLINICAL DATA:  Chronic left shoulder pain.   EXAM: CT OF THE UPPER LEFT EXTREMITY WITHOUT CONTRAST   TECHNIQUE: Multidetector CT imaging of the upper left extremity was performed according to the standard protocol.   RADIATION DOSE REDUCTION: This exam was performed according to the departmental dose-optimization program which includes automated exposure control, adjustment of the mA and/or kV according to patient size and/or use of iterative reconstruction technique.   COMPARISON:  Left shoulder x-rays dated May 20, 2018.   FINDINGS: Bones/Joint/Cartilage   No fracture or dislocation.   Severe osteoarthritis of the glenohumeral joint with severe joint space narrowing, bone-on-bone appearance, subchondral sclerosis, subchondral cystic changes and marginal osteophytosis. Moderate joint effusion.   Normal acromioclavicular joint.   Ligaments   Ligaments are suboptimally evaluated by CT.   Muscles and Tendons Grossly intact.  No muscle atrophy.   Soft tissue No fluid collection or hematoma. No soft tissue mass. The visualized left lung is clear.   IMPRESSION: 1. Severe left glenohumeral osteoarthritis.     Electronically Signed   By: Obie Dredge M.D.   On: 11/04/2022 09:32   PATIENT SURVEYS:  FOTO 36/100 with target of 59    COGNITION: Overall cognitive status: Within functional limits for tasks  assessed  SENSATION: WFL   POSTURE: No abnormalities noted    UPPER EXTREMITY ROM:    Active / PROM ROM Right eval Left eval 02/05/23 Right  02/05/23 Left   Shoulder flexion 180/180 90/180 180/180 160/150  Shoulder extension        Shoulder abduction 180/180 60*/60* 180/180 90*/160*  Shoulder adduction        Shoulder internal rotation (combined)  Thoracic Spine  PSIS     Shoulder external rotation     Occiput Occiput   Elbow flexion        Elbow extension        Wrist flexion        Wrist extension        Wrist ulnar deviation        Wrist radial deviation        Wrist pronation        Wrist supination        (Blank rows = not tested)   UPPER EXTREMITY MMT:   MMT Right eval Left eval Right 02/05/23 Left  02/05/23  Shoulder flexion 5 3+* 4+ 4  Shoulder extension        Shoulder abduction  5 3+  4 4-  Shoulder adduction 5 3+*    Shoulder internal rotation     4 4-  Shoulder external rotation     4 4-  Middle trapezius     4- 3+  Lower trapezius     4- 3-  Elbow flexion        Elbow extension        Wrist flexion        Wrist extension        Wrist ulnar deviation        Wrist radial deviation        Wrist pronation        Wrist supination        Grip strength (lbs)        (Blank rows = not tested)       JOINT MOBILITY TESTING:  Crepitus and firm end field at 90 degrees flexion and abduction    PALPATION:  TTP around entire left shoulder joint              TODAY'S TREATMENT:                                                                                                                                         DATE:   02/09/23: UBE seat at 8 for 5 min (2.5 min forward and 2.5 min backward)  OMEGA Seated Rows #15 1 x 10  OMEGA Seated Rows #15 with pillows to increase height of sitting position 2 x 10  Rhythmic Stabilization in Quadruped with red ball 1 x 10 clockwise circles and 1 x 10 counter clockwise circles   Rhythmic  Stabilization in Supine with #3 DB 3  x 10 clockwise circles and 3 x 10 counter clockwise circles     02/05/23: UBE seat at 9 for 5 min (2.5 min forward and 2.5 min backward)  See ROM measurements above  FOTO: 49/100  Shoulder MMT  Shoulder Flex R/L 4+/4 Shoulder Abduction R/L 4/4- Mid Trap R/L 4-/3+ Shoulder Ext R/L 4-/3+ Shoulder Lower Trap R/L 4-/3- Shoulder ER at 90 degrees R/L 4/4- Shoulder IR at 90 degrees R/L 4/3+ Standing Shoulder Abduction on LUE only able to achieve 90 deg AROM 1 x 10 Supine Shoulder Abduction on LUE able to achieve 160 degrees AROM 1 x 10    02/03/23: UBE seat at 8 for 5 min  Seated Rows with Black Band 1 x 10  Seated Rows with Gray Band 1 x 10  Seated on Incline Bolster AAROM ER in scaption plane 3 x 10 Supine Biceps Stretch on LUE 3 x 30 sec  Shoulder Extension to neutral AROM on counter 2 x 10    -mod VC for sequence of exercise    Trigger Point Dry-Needling  Treatment instructions: Expect mild to moderate muscle soreness. S/S of pneumothorax if dry needled over a lung field, and to seek immediate medical attention should they occur. Patient verbalized understanding of these instructions and education.  Patient Consent Given: Yes Education handout provided: No Muscles treated: Infraspinatus and left upper trap  Electrical stimulation performed: No Parameters: N/A Treatment response/outcome: Twitch in left infraspinatus muscle  Shoulder Flex PROM on LUE 160 degrees  Shoulder ER PROM on LUE 60 degrees Shoulder IR PROM on LUE 80 degrees  Shoulder AROM Flexion/Extension in sitting 1 x 10  Shoulder AROM Abduction/ Adduction in sitting 1 x 10     PATIENT EDUCATION: Education details: form and technique for appropriate exercise and explanation about post rehab protocol  Person educated: Patient Education method: Explanation, Demonstration, Verbal cues, and Handouts Education comprehension: verbalized understanding, returned demonstration, and  verbal cues required   HOME EXERCISE PROGRAM: Access Code: [email or web address] URL: [email or web address] Date: 02/09/2023 Prepared by: Ellin Goodie  Exercises - Seated Upper Trapezius Stretch  - 1 x daily - 3 reps - 30-60 sec hold - Seated Levator Scapulae Stretch  - 1 x daily - 3 reps - 30 sec  hold - Seated Single Arm Shoulder Row with Anchored Resistance  - 3 x weekly - 3 sets - 10 reps - Isometric Shoulder External Rotation  - 3 x weekly - 3 sets - 10 reps - 5 sec  hold - Doorway Rhomboid Stretch  - 1 x daily - 3 reps - 30 sec  hold - Standing Sleeper Stretch at [employer]  - 1 x daily - 3 reps - 30 sec  hold - Shoulder Abduction Towel Slide on Railing   - 1 x daily - 3 sets - 10 reps - Supine Shoulder External Rotation in 45 Degrees Abduction AAROM with Dowel  - 1 x daily - 3 sets - 10 reps - 3 sec hold hold - Supine Shoulder Flexion Extension Full Range AROM  - 3 x weekly - 3 sets - 10 reps - Prone Single Arm Shoulder Extension on Swiss Ball  - 3 x weekly - 3 sets - 10 reps - Supine Shoulder Abduction AROM  - 3 x weekly - 3 sets - 10 reps - Supine Shoulder Rhythmic Stabilization-circles clockwise and counter clockwise   - 3 x weekly - 3 sets - 10 reps - Seated Row Cable Machine  - 3 x weekly - 3 sets -  10 reps - Supine Shoulder External Rotation Stretch  - 1 x daily - 3 reps - 60 sec  hold - Standing Shoulder Horizontal Abduction but do not use resistance   - 3 x weekly - 3 sets - 10 reps   ASSESSMENT:   CLINICAL IMPRESSION: Pt presents 8 weeks s/p left shoulder total arthroplasty. She continues to show improved parascapular and left shoulder strength with ability to perform horizontal abduction at 90 degrees and rhythmic stabilization without an increase in pain. She is reducing her frequency to 1x per week due return to work. 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   OBJECTIVE IMPAIRMENTS: decreased ROM, decreased strength, impaired UE functional use, and pain.    ACTIVITY LIMITATIONS: carrying, lifting, bathing, dressing, and reach over head   PARTICIPATION LIMITATIONS: cleaning, laundry, shopping, and occupation   PERSONAL FACTORS: Age, Time since onset of injury/illness/exacerbation, and 1 comorbidity: lumber surgery  are also affecting patient's functional outcome.    REHAB POTENTIAL: Good   CLINICAL DECISION MAKING: Stable/uncomplicated   EVALUATION COMPLEXITY: Low     GOALS: Goals reviewed with patient? No   SHORT TERM GOALS: Target date: 12/03/2022   Pt will be independent with HEP in order to improve strength and balance in order to decrease fall risk and improve function at home and work. Baseline: Not able to do 02/05/23: Performing independently   Goal status: Ongoing    2.  Patient will demonstrate knowledge of how to correctly don and doff left shoulder brace in order to be able to do it after surgery.   Baseline: Patient able to perform independently  Goal status: MET       LONG TERM GOALS: Target date: 01/28/2023   Patient will have improved function and activity level as evidenced by an increase in FOTO score by 10 points or more.  Baseline: 36/100 with target of 59 02/05/23: 49/100 with target 59 Goal status: Achieved    2.  Patient will show symmetrical AROM between left shoulder with right shoulder to regain ability to perform reaching tasks for her job.  Baseline: Shoulder Flexion R/L: 180/90*, Shoulder Abduction R/L 180/60, Shoulder combined IR R/L Thoracic spine, PSIS 02/05/23: See ROM measurements above  Goal status: Ongoing   3. Patient will show symmetrical strength between left shoulder with right shoulder to regain ability to perform reaching tasks for her job.  Baseline: Unable to flex or abduct LUE past 90 degrees 02/05/23: Still limited with shoulder abduction and IR   Goal status: Ongoing      PLAN:   PT FREQUENCY: 1-2x/week   PT DURATION: 10 weeks   PLANNED INTERVENTIONS: Therapeutic exercises, Neuromuscular re-education, Self Care, Joint mobilization, Joint manipulation, Aquatic Therapy, Dry Needling, Electrical stimulation, Spinal manipulation, Spinal mobilization, Cryotherapy, Moist heat, Manual therapy, and Re-evaluation   PLAN FOR NEXT SESSION: Progress shoulder AROM and parscapular exercises: serratus punches, shoulder abduction ball roll, supine flexion with elastic band   Ellin Goodie PT, DPT  02/09/2023, 12:55 PM

## 2023-02-14 MED FILL — Telmisartan Tab 40 MG: ORAL | 30 days supply | Qty: 30 | Fill #1 | Status: AC

## 2023-02-17 ENCOUNTER — Ambulatory Visit: Payer: 59 | Attending: Orthopaedic Surgery | Admitting: Physical Therapy

## 2023-02-17 DIAGNOSIS — M25512 Pain in left shoulder: Secondary | ICD-10-CM | POA: Diagnosis not present

## 2023-02-17 DIAGNOSIS — M19012 Primary osteoarthritis, left shoulder: Secondary | ICD-10-CM | POA: Diagnosis not present

## 2023-02-17 DIAGNOSIS — G8929 Other chronic pain: Secondary | ICD-10-CM | POA: Insufficient documentation

## 2023-02-17 NOTE — Therapy (Signed)
OUTPATIENT PHYSICAL THERAPY PROGRESS NOTE   Patient Name: Sara Perry MRN: [id number] DOB:[birth year], 62 y.o., female Today's Date: 02/17/2023  PCP: Dr. Dale Punaluu  REFERRING PROVIDER: Dr. Ramond Marrow   END OF SESSION:   PT End of Session - 02/17/23 1732     Visit Number 12    Number of Visits 20    Date for PT Re-Evaluation 03/30/23    Authorization Type [facility] [employer] [id number]    Authorization Time Period 01/28/23-03/30/23    Authorization - Visit Number 12    Authorization - Number of Visits 20    Progress Note Due on Visit 10    PT Start Time 1730    PT Stop Time 1815    PT Time Calculation (min) 45 min    Activity Tolerance Patient tolerated treatment well    Behavior During Therapy WFL for tasks assessed/performed                   Past Medical History:  Diagnosis Date   Arthritis    Complication of anesthesia    pt states she is very sensitive to most drugs, usually needs 1/2 of whatever other people get. She states as a child/teenager she had difficulty waking up after surgery due to the sensitivity that she has to drugs.   Eczema    Frequent UTI    H/O lymphadenopathy    With previous submandibular node removals.   Hypertension    Recurrent sinus infections    Sleep apnea    uses c-pap   Past Surgical History:  Procedure Laterality Date   COLONOSCOPY WITH PROPOFOL N/A 08/25/2017   Procedure: COLONOSCOPY WITH PROPOFOL;  Surgeon: Midge Minium, MD;  Location: [facility];  Service: Endoscopy;  Laterality: N/A;   COLONOSCOPY WITH PROPOFOL N/A 10/17/2022   Procedure: COLONOSCOPY WITH PROPOFOL;  Surgeon: Midge Minium, MD;  Location: [facility];  Service: Endoscopy;  Laterality: N/A;   LUMBAR FUSION  12/26/2019   l4-l5   Lymph Node Removal  1984   NECK SURGERY  1977   H/O Right neck surgery secondary to a benign growth with when patient was 85 or 62 yo   skin lesion extraction  2013   basal cell - Dr Cheree Ditto   TOTAL SHOULDER ARTHROPLASTY  Left 12/18/2022   Procedure: TOTAL SHOULDER ARTHROPLASTY;  Surgeon: Bjorn Pippin, MD;  Location: [facility];  Service: Orthopedics;  Laterality: Left;   Uterine Polyps Removed  2004   Patient Active Problem List   Diagnosis Date Noted   Pre-op evaluation 11/16/2022   History of colonic polyps 10/17/2022   Abdominal bruit 09/27/2021   Osteoarthritis 05/30/2021   Ganglion cyst 01/20/2021   Urinary frequency 09/17/2020   Sleep apnea 06/20/2020   Thumb pain, right 06/20/2020   Lumbar stenosis with neurogenic claudication 12/26/2019   Hyperbilirubinemia 12/14/2019   Eye injury 05/26/2019   Left shoulder pain 05/23/2018   Snoring 05/23/2018   Benign neoplasm of descending colon    Benign neoplasm of ascending colon    Vaginal atrophy 03/22/2017   Meniere's disease 08/17/2016   Colon cancer screening 08/11/2015   Fatigue 06/03/2015   Health care maintenance 06/03/2015   Neck pain 02/05/2014   Hypercholesterolemia 02/05/2014   Back pain 12/05/2012   Trigger finger 12/05/2012   Essential hypertension, benign 12/05/2012    REFERRING DIAG: Left shoulder pain, left shoulder osteoarthritis   THERAPY DIAG:  Chronic left shoulder pain  Primary osteoarthritis of left shoulder  Rationale  for Evaluation and Treatment Rehabilitation  PERTINENT HISTORY: Pt reports that she is having left total shoulder surgery on February 22nd. It is a same day surgery. She did PT 4 years ago for her left shoulder and she has also tried PRP and gel injections, but these did not help with her shoulder pain. She has had prior lumbar spine surgery several years ago, and since then she has used arms more rather than rotate, which is why she thinks it caused overuse in left shoulder. She here clicking and popping in shoulder when she reaches above her head followed by pain. She states that she brought her post surgical shoulder brace in and she would like to know more about how to wear it correctly and  what she can and cannot do post surgery like whether she can use her left hand.   PRECAUTIONS: None   SUBJECTIVE:                                                                                                                                                                                      SUBJECTIVE STATEMENT: Pt reports that she has some fatigue and pain in the anterior portion of her left shoulder since returning to work.   PAIN:  Are you having pain? No   OBJECTIVE: (objective measures completed at initial evaluation unless otherwise dated)   DIAGNOSTIC FINDINGS:  CLINICAL DATA:  Chronic left shoulder pain.   EXAM: CT OF THE UPPER LEFT EXTREMITY WITHOUT CONTRAST   TECHNIQUE: Multidetector CT imaging of the upper left extremity was performed according to the standard protocol.   RADIATION DOSE REDUCTION: This exam was performed according to the departmental dose-optimization program which includes automated exposure control, adjustment of the mA and/or kV according to patient size and/or use of iterative reconstruction technique.   COMPARISON:  Left shoulder x-rays dated May 20, 2018.   FINDINGS: Bones/Joint/Cartilage   No fracture or dislocation.   Severe osteoarthritis of the glenohumeral joint with severe joint space narrowing, bone-on-bone appearance, subchondral sclerosis, subchondral cystic changes and marginal osteophytosis. Moderate joint effusion.   Normal acromioclavicular joint.   Ligaments   Ligaments are suboptimally evaluated by CT.   Muscles and Tendons Grossly intact.  No muscle atrophy.   Soft tissue No fluid collection or hematoma. No soft tissue mass. The visualized left lung is clear.   IMPRESSION: 1. Severe left glenohumeral osteoarthritis.     Electronically Signed   By: Obie Dredge M.D.   On: 11/04/2022 09:32   PATIENT SURVEYS:  FOTO 36/100 with target of 59    COGNITION: Overall cognitive status: Within functional  limits for tasks assessed  SENSATION: WFL   POSTURE: No abnormalities noted    UPPER EXTREMITY ROM:    Active / PROM ROM Right eval Left eval 02/05/23 Right  02/05/23 Left   Shoulder flexion 180/180 90/180 180/180 160/150  Shoulder extension        Shoulder abduction 180/180 60*/60* 180/180 90*/160*  Shoulder adduction        Shoulder internal rotation (combined)  Thoracic Spine  PSIS     Shoulder external rotation     Occiput Occiput   Elbow flexion        Elbow extension        Wrist flexion        Wrist extension        Wrist ulnar deviation        Wrist radial deviation        Wrist pronation        Wrist supination        (Blank rows = not tested)   UPPER EXTREMITY MMT:   MMT Right eval Left eval Right 02/05/23 Left  02/05/23  Shoulder flexion 5 3+* 4+ 4  Shoulder extension        Shoulder abduction  5 3+  4 4-  Shoulder adduction 5 3+*    Shoulder internal rotation     4 4-  Shoulder external rotation     4 4-  Middle trapezius     4- 3+  Lower trapezius     4- 3-  Elbow flexion        Elbow extension        Wrist flexion        Wrist extension        Wrist ulnar deviation        Wrist radial deviation        Wrist pronation        Wrist supination        Grip strength (lbs)        (Blank rows = not tested)       JOINT MOBILITY TESTING:  Crepitus and firm end field at 90 degrees flexion and abduction    PALPATION:  TTP around entire left shoulder joint              TODAY'S TREATMENT:                                                                                                                                         DATE:   02/17/23: Left Shoulder  UBE seat at 8 for 5 min (2.5 min forward and 2.5 min backward)  OMEGA Seated Rows #20 3 x 10  D2 PNF Flexion/Extension AROM 3 x 10  D1 PNF Flexion/Extension Red Band  1 x 10  D1 PNF Flexion/Extension Yellow Band 2 x 10  Field Goals 3 x 10 with mirror for visual  input  Shoulder W's AROM 3  x 10   02/09/23: UBE seat at 8 for 5 min (2.5 min forward and 2.5 min backward)  OMEGA Seated Rows #15 1 x 10  OMEGA Seated Rows #15 with pillows to increase height of sitting position 2 x 10  Rhythmic Stabilization in Quadruped with red ball 1 x 10 clockwise circles and 1 x 10 counter clockwise circles   Rhythmic Stabilization in Supine with #3 DB 3 x 10 clockwise circles and 3 x 10 counter clockwise circles     02/05/23: UBE seat at 9 for 5 min (2.5 min forward and 2.5 min backward)  See ROM measurements above  FOTO: 49/100  Shoulder MMT  Shoulder Flex R/L 4+/4 Shoulder Abduction R/L 4/4- Mid Trap R/L 4-/3+ Shoulder Ext R/L 4-/3+ Shoulder Lower Trap R/L 4-/3- Shoulder ER at 90 degrees R/L 4/4- Shoulder IR at 90 degrees R/L 4/3+ Standing Shoulder Abduction on LUE only able to achieve 90 deg AROM 1 x 10 Supine Shoulder Abduction on LUE able to achieve 160 degrees AROM 1 x 10    02/03/23: UBE seat at 8 for 5 min  Seated Rows with Black Band 1 x 10  Seated Rows with Gray Band 1 x 10  Seated on Incline Bolster AAROM ER in scaption plane 3 x 10 Supine Biceps Stretch on LUE 3 x 30 sec  Shoulder Extension to neutral AROM on counter 2 x 10    -mod VC for sequence of exercise    Trigger Point Dry-Needling  Treatment instructions: Expect mild to moderate muscle soreness. S/S of pneumothorax if dry needled over a lung field, and to seek immediate medical attention should they occur. Patient verbalized understanding of these instructions and education.  Patient Consent Given: Yes Education handout provided: No Muscles treated: Infraspinatus and left upper trap  Electrical stimulation performed: No Parameters: N/A Treatment response/outcome: Twitch in left infraspinatus muscle  Shoulder Flex PROM on LUE 160 degrees  Shoulder ER PROM on LUE 60 degrees Shoulder IR PROM on LUE 80 degrees  Shoulder AROM Flexion/Extension in sitting 1 x 10  Shoulder AROM  Abduction/ Adduction in sitting 1 x 10     PATIENT EDUCATION: Education details: form and technique for appropriate exercise and explanation about post rehab protocol  Person educated: Patient Education method: Explanation, Demonstration, Verbal cues, and Handouts Education comprehension: verbalized understanding, returned demonstration, and verbal cues required   HOME EXERCISE PROGRAM: Access Code: [email or web address] URL: [email or web address] Date: 02/17/2023 Prepared by: Ellin Goodie  Exercises - Seated Upper Trapezius Stretch  - 1 x daily - 3 reps - 30-60 sec hold - Seated Levator Scapulae Stretch  - 1 x daily - 3 reps - 30 sec  hold - Seated Single Arm Shoulder Row with Anchored Resistance  - 3 x weekly - 3 sets - 10 reps - Isometric Shoulder External Rotation  - 3 x weekly - 3 sets - 10 reps - 5 sec  hold - Doorway Rhomboid Stretch  - 1 x daily - 3 reps - 30 sec  hold - Standing Sleeper Stretch at [employer]  - 1 x daily - 3 reps - 30 sec  hold - Shoulder Abduction Towel Slide on Railing   - 1 x daily - 3 sets - 10 reps - Supine Shoulder External Rotation in 45 Degrees Abduction AAROM with Dowel  - 1 x daily - 3 sets - 10 reps - 3 sec hold hold - Supine Shoulder Flexion Extension Full Range AROM  - 3 x weekly - 3 sets -  10 reps - Supine Shoulder Abduction AROM  - 3 x weekly - 3 sets - 10 reps - Supine Shoulder Rhythmic Stabilization-circles clockwise and counter clockwise   - 3 x weekly - 3 sets - 10 reps - Seated Row Cable Machine  - 3 x weekly - 3 sets - 10 reps - Supine Shoulder External Rotation Stretch  - 1 x daily - 3 reps - 60 sec  hold - Standing Shoulder Horizontal Abduction but do not use resistance   - 3 x weekly - 3 sets - 10 reps - Shoulder PNF D2 Flexion  - 3 x weekly - 3 sets - 10 reps - Standing Single Arm Shoulder PNF D1 Extension with Anchored Resistance  - 3 x weekly - 3 sets - 10 reps - Standing Shoulder External and Internal Rotation AROM  - 3 x weekly - 3  sets - 10 reps - Standing Shoulder Scaption  - 3 x weekly - 3 sets - 10 reps   ASSESSMENT:   CLINICAL IMPRESSION: Pt presents 9 weeks s/p left shoulder total arthroplasty. She shows excellent motor control with shoulder flexion with decreased left upper trap activation. She still has decreased shoulder external rotation and shoulder abduction, but shoulder mobility exercises deferred until next session in order to prioritize parascapular and shoulder strengthening as well as neuromuscular retraining exercises. She is returning frequency to 2x per week now that she has adjusted to her work schedule. 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   OBJECTIVE IMPAIRMENTS: decreased ROM, decreased strength, impaired UE functional use, and pain.    ACTIVITY LIMITATIONS: carrying, lifting, bathing, dressing, and reach over head   PARTICIPATION LIMITATIONS: cleaning, laundry, shopping, and occupation   PERSONAL FACTORS: Age, Time since onset of injury/illness/exacerbation, and 1 comorbidity: lumber surgery  are also affecting patient's functional outcome.    REHAB POTENTIAL: Good   CLINICAL DECISION MAKING: Stable/uncomplicated   EVALUATION COMPLEXITY: Low     GOALS: Goals reviewed with patient? No   SHORT TERM GOALS: Target date: 12/03/2022   Pt will be independent with HEP in order to improve strength and balance in order to decrease fall risk and improve function at home and work. Baseline: Not able to do 02/05/23: Performing independently   Goal status: Ongoing    2.  Patient will demonstrate knowledge of how to correctly don and doff left shoulder brace in order to be able to do it after surgery.   Baseline: Patient able to perform independently  Goal status: MET       LONG TERM GOALS: Target date: 01/28/2023   Patient will have improved function and activity level as  evidenced by an increase in FOTO score by 10 points or more.  Baseline: 36/100 with target of 59 02/05/23: 49/100 with target 59 Goal status: Achieved    2.  Patient will show symmetrical AROM between left shoulder with right shoulder to regain ability to perform reaching tasks for her job.  Baseline: Shoulder Flexion R/L: 180/90*, Shoulder Abduction R/L 180/60, Shoulder combined IR R/L Thoracic spine, PSIS 02/05/23: See ROM measurements above  Goal status: Ongoing   3. Patient will show symmetrical strength between left shoulder with right shoulder to regain ability to perform reaching tasks for her job.  Baseline: Unable to flex or abduct LUE past 90 degrees 02/05/23: Still limited with shoulder abduction and IR   Goal  status: Ongoing     PLAN:   PT FREQUENCY: 1-2x/week   PT DURATION: 10 weeks   PLANNED INTERVENTIONS: Therapeutic exercises, Neuromuscular re-education, Self Care, Joint mobilization, Joint manipulation, Aquatic Therapy, Dry Needling, Electrical stimulation, Spinal manipulation, Spinal mobilization, Cryotherapy, Moist heat, Manual therapy, and Re-evaluation   PLAN FOR NEXT SESSION: Progress shoulder AROM and parscapular exercises: shoulder external rotation in supine with light weight, shoulder abduction and flexion wall walks, ball rolls with shoulder abduction   Ellin Goodie PT, DPT  02/17/2023, 5:33 PM

## 2023-02-19 ENCOUNTER — Encounter: Payer: 59 | Admitting: Internal Medicine

## 2023-02-19 ENCOUNTER — Ambulatory Visit: Payer: 59 | Admitting: Physical Therapy

## 2023-02-19 DIAGNOSIS — G8929 Other chronic pain: Secondary | ICD-10-CM | POA: Diagnosis not present

## 2023-02-19 DIAGNOSIS — M19012 Primary osteoarthritis, left shoulder: Secondary | ICD-10-CM

## 2023-02-19 DIAGNOSIS — M25512 Pain in left shoulder: Secondary | ICD-10-CM | POA: Diagnosis not present

## 2023-02-19 NOTE — Therapy (Signed)
OUTPATIENT PHYSICAL THERAPY PROGRESS NOTE   Patient Name: Sara Perry MRN: [id number] DOB:1960/12/19, 62 y.o., female Today's Date: 02/19/2023  PCP: Dr. Dale Ragsdale  REFERRING PROVIDER: Dr. Ramond Marrow   END OF SESSION:   PT End of Session - 02/19/23 1736     Visit Number 13    Number of Visits 20    Date for PT Re-Evaluation 03/30/23    Authorization Type [facility] [employer] [id number]    Authorization Time Period 01/28/23-03/30/23    Authorization - Visit Number 13    Authorization - Number of Visits 20    Progress Note Due on Visit 10    PT Start Time 1735    PT Stop Time 1815    PT Time Calculation (min) 40 min    Activity Tolerance Patient tolerated treatment well    Behavior During Therapy WFL for tasks assessed/performed                   Past Medical History:  Diagnosis Date   Arthritis    Complication of anesthesia    pt states she is very sensitive to most drugs, usually needs 1/2 of whatever other people get. She states as a child/teenager she had difficulty waking up after surgery due to the sensitivity that she has to drugs.   Eczema    Frequent UTI    H/O lymphadenopathy    With previous submandibular node removals.   Hypertension    Recurrent sinus infections    Sleep apnea    uses c-pap   Past Surgical History:  Procedure Laterality Date   COLONOSCOPY WITH PROPOFOL N/A 08/25/2017   Procedure: COLONOSCOPY WITH PROPOFOL;  Surgeon: Midge Minium, MD;  Location: [facility];  Service: Endoscopy;  Laterality: N/A;   COLONOSCOPY WITH PROPOFOL N/A 10/17/2022   Procedure: COLONOSCOPY WITH PROPOFOL;  Surgeon: Midge Minium, MD;  Location: [facility];  Service: Endoscopy;  Laterality: N/A;   LUMBAR FUSION  12/26/2019   l4-l5   Lymph Node Removal  1984   NECK SURGERY  1977   H/O Right neck surgery secondary to a benign growth with when patient was 74 or 62 yo   skin lesion extraction  2013   basal cell - Dr Cheree Ditto   TOTAL SHOULDER ARTHROPLASTY  Left 12/18/2022   Procedure: TOTAL SHOULDER ARTHROPLASTY;  Surgeon: Bjorn Pippin, MD;  Location: [facility];  Service: Orthopedics;  Laterality: Left;   Uterine Polyps Removed  2004   Patient Active Problem List   Diagnosis Date Noted   Pre-op evaluation 11/16/2022   History of colonic polyps 10/17/2022   Abdominal bruit 09/27/2021   Osteoarthritis 05/30/2021   Ganglion cyst 01/20/2021   Urinary frequency 09/17/2020   Sleep apnea 06/20/2020   Thumb pain, right 06/20/2020   Lumbar stenosis with neurogenic claudication 12/26/2019   Hyperbilirubinemia 12/14/2019   Eye injury 05/26/2019   Left shoulder pain 05/23/2018   Snoring 05/23/2018   Benign neoplasm of descending colon    Benign neoplasm of ascending colon    Vaginal atrophy 03/22/2017   Meniere's disease 08/17/2016   Colon cancer screening 08/11/2015   Fatigue 06/03/2015   Health care maintenance 06/03/2015   Neck pain 02/05/2014   Hypercholesterolemia 02/05/2014   Back pain 12/05/2012   Trigger finger 12/05/2012   Essential hypertension, benign 12/05/2012    REFERRING DIAG: Left shoulder pain, left shoulder osteoarthritis   THERAPY DIAG:  Chronic left shoulder pain  Primary osteoarthritis of left shoulder  Rationale  for Evaluation and Treatment Rehabilitation  PERTINENT HISTORY: Pt reports that she is having left total shoulder surgery on February 22nd. It is a same day surgery. She did PT 4 years ago for her left shoulder and she has also tried PRP and gel injections, but these did not help with her shoulder pain. She has had prior lumbar spine surgery several years ago, and since then she has used arms more rather than rotate, which is why she thinks it caused overuse in left shoulder. She here clicking and popping in shoulder when she reaches above her head followed by pain. She states that she brought her post surgical shoulder brace in and she would like to know more about how to wear it correctly and  what she can and cannot do post surgery like whether she can use her left hand.   PRECAUTIONS: None   SUBJECTIVE:                                                                                                                                                                                      SUBJECTIVE STATEMENT: Pt reports having a busy day at work and despite the physical demands, she was still able to complete work day without difficulty.   PAIN:  Are you having pain? No   OBJECTIVE: (objective measures completed at initial evaluation unless otherwise dated)   DIAGNOSTIC FINDINGS:  CLINICAL DATA:  Chronic left shoulder pain.   EXAM: CT OF THE UPPER LEFT EXTREMITY WITHOUT CONTRAST   TECHNIQUE: Multidetector CT imaging of the upper left extremity was performed according to the standard protocol.   RADIATION DOSE REDUCTION: This exam was performed according to the departmental dose-optimization program which includes automated exposure control, adjustment of the mA and/or kV according to patient size and/or use of iterative reconstruction technique.   COMPARISON:  Left shoulder x-rays dated May 20, 2018.   FINDINGS: Bones/Joint/Cartilage   No fracture or dislocation.   Severe osteoarthritis of the glenohumeral joint with severe joint space narrowing, bone-on-bone appearance, subchondral sclerosis, subchondral cystic changes and marginal osteophytosis. Moderate joint effusion.   Normal acromioclavicular joint.   Ligaments   Ligaments are suboptimally evaluated by CT.   Muscles and Tendons Grossly intact.  No muscle atrophy.   Soft tissue No fluid collection or hematoma. No soft tissue mass. The visualized left lung is clear.   IMPRESSION: 1. Severe left glenohumeral osteoarthritis.     Electronically Signed   By: Obie Dredge M.D.   On: 11/04/2022 09:32   PATIENT SURVEYS:  FOTO 36/100 with target of 59    COGNITION: Overall cognitive status:  Within functional limits for tasks assessed  SENSATION: WFL   POSTURE: No abnormalities noted    UPPER EXTREMITY ROM:    Active / PROM ROM Right eval Left eval 02/05/23 Right  02/05/23 Left   Shoulder flexion 180/180 90/180 180/180 160/150  Shoulder extension        Shoulder abduction 180/180 60*/60* 180/180 90*/160*  Shoulder adduction        Shoulder internal rotation (combined)  Thoracic Spine  PSIS     Shoulder external rotation     Occiput Occiput   Elbow flexion        Elbow extension        Wrist flexion        Wrist extension        Wrist ulnar deviation        Wrist radial deviation        Wrist pronation        Wrist supination        (Blank rows = not tested)   UPPER EXTREMITY MMT:   MMT Right eval Left eval Right 02/05/23 Left  02/05/23  Shoulder flexion 5 3+* 4+ 4  Shoulder extension        Shoulder abduction  5 3+  4 4-  Shoulder adduction 5 3+*    Shoulder internal rotation     4 4-  Shoulder external rotation     4 4-  Middle trapezius     4- 3+  Lower trapezius     4- 3-  Elbow flexion        Elbow extension        Wrist flexion        Wrist extension        Wrist ulnar deviation        Wrist radial deviation        Wrist pronation        Wrist supination        Grip strength (lbs)        (Blank rows = not tested)       JOINT MOBILITY TESTING:  Crepitus and firm end field at 90 degrees flexion and abduction    PALPATION:  TTP around entire left shoulder joint              TODAY'S TREATMENT:                                                                                                                                         DATE:   02/19/23: All exercises performed on left shoulder  UBE seat at 8 for 5 min (2.5 min forward and 2.5 min backward) Seated Shoulder ER PROM Stretch 3 x 30 sec    02/17/23: Left Shoulder  UBE seat at 8 for 5 min (2.5 min forward and 2.5 min backward)  OMEGA Seated Rows #20 3 x  10  D2 PNF Flexion/Extension AROM 3 x 10  D1 PNF Flexion/Extension Red Band  1 x 10  D1 PNF Flexion/Extension Yellow Band 2 x 10  Field Goals 3 x 10 with mirror for visual input  Shoulder W's AROM 3 x 10   02/09/23: UBE seat at 8 for 5 min (2.5 min forward and 2.5 min backward)  OMEGA Seated Rows #15 1 x 10  OMEGA Seated Rows #15 with pillows to increase height of sitting position 2 x 10  Rhythmic Stabilization in Quadruped with red ball 1 x 10 clockwise circles and 1 x 10 counter clockwise circles   Rhythmic Stabilization in Supine with #3 DB 3 x 10 clockwise circles and 3 x 10 counter clockwise circles     02/05/23: UBE seat at 9 for 5 min (2.5 min forward and 2.5 min backward)  See ROM measurements above  FOTO: 49/100  Shoulder MMT  Shoulder Flex R/L 4+/4 Shoulder Abduction R/L 4/4- Mid Trap R/L 4-/3+ Shoulder Ext R/L 4-/3+ Shoulder Lower Trap R/L 4-/3- Shoulder ER at 90 degrees R/L 4/4- Shoulder IR at 90 degrees R/L 4/3+ Standing Shoulder Abduction on LUE only able to achieve 90 deg AROM 1 x 10 Supine Shoulder Abduction on LUE able to achieve 160 degrees AROM 1 x 10    02/03/23: UBE seat at 8 for 5 min  Seated Rows with Black Band 1 x 10  Seated Rows with Gray Band 1 x 10  Seated on Incline Bolster AAROM ER in scaption plane 3 x 10 Supine Biceps Stretch on LUE 3 x 30 sec  Shoulder Extension to neutral AROM on counter 2 x 10    -mod VC for sequence of exercise    Trigger Point Dry-Needling  Treatment instructions: Expect mild to moderate muscle soreness. S/S of pneumothorax if dry needled over a lung field, and to seek immediate medical attention should they occur. Patient verbalized understanding of these instructions and education.  Patient Consent Given: Yes Education handout provided: No Muscles treated: Infraspinatus and left upper trap  Electrical stimulation performed: No Parameters: N/A Treatment response/outcome: Twitch in left infraspinatus  muscle  Shoulder Flex PROM on LUE 160 degrees  Shoulder ER PROM on LUE 60 degrees Shoulder IR PROM on LUE 80 degrees  Shoulder AROM Flexion/Extension in sitting 1 x 10  Shoulder AROM Abduction/ Adduction in sitting 1 x 10     PATIENT EDUCATION: Education details: form and technique for appropriate exercise and explanation about post rehab protocol  Person educated: Patient Education method: Explanation, Demonstration, Verbal cues, and Handouts Education comprehension: verbalized understanding, returned demonstration, and verbal cues required   HOME EXERCISE PROGRAM: Access Code: [email or web address] URL: [email or web address] Date: 02/17/2023 Prepared by: Ellin Goodie  Exercises - Seated Upper Trapezius Stretch  - 1 x daily - 3 reps - 30-60 sec hold - Seated Levator Scapulae Stretch  - 1 x daily - 3 reps - 30 sec  hold - Seated Single Arm Shoulder Row with Anchored Resistance  - 3 x weekly - 3 sets - 10 reps - Isometric Shoulder External Rotation  - 3 x weekly - 3 sets - 10 reps - 5 sec  hold - Doorway Rhomboid Stretch  - 1 x daily - 3 reps - 30 sec  hold - Standing Sleeper Stretch at [employer]  - 1 x daily - 3 reps - 30 sec  hold - Shoulder Abduction Towel Slide on Railing   - 1 x daily - 3 sets - 10 reps - Supine Shoulder External Rotation in 45 Degrees Abduction AAROM  with Dowel  - 1 x daily - 3 sets - 10 reps - 3 sec hold hold - Supine Shoulder Flexion Extension Full Range AROM  - 3 x weekly - 3 sets - 10 reps - Supine Shoulder Abduction AROM  - 3 x weekly - 3 sets - 10 reps - Supine Shoulder Rhythmic Stabilization-circles clockwise and counter clockwise   - 3 x weekly - 3 sets - 10 reps - Seated Row Cable Machine  - 3 x weekly - 3 sets - 10 reps - Supine Shoulder External Rotation Stretch  - 1 x daily - 3 reps - 60 sec  hold - Standing Shoulder Horizontal Abduction but do not use resistance   - 3 x weekly - 3 sets - 10 reps - Shoulder PNF D2 Flexion  - 3 x weekly - 3 sets -  10 reps - Standing Single Arm Shoulder PNF D1 Extension with Anchored Resistance  - 3 x weekly - 3 sets - 10 reps - Standing Shoulder External and Internal Rotation AROM  - 3 x weekly - 3 sets - 10 reps - Standing Shoulder Scaption  - 3 x weekly - 3 sets - 10 reps   ASSESSMENT:   CLINICAL IMPRESSION: Pt presents 9 weeks s/p left shoulder total arthroplasty. Pt shows improvement with left shoulder abduction AROM reaching 90 degrees without activation of upper trap. She does have continued tightness above 90 degrees. Pt continues to be limited with shoulder external rotation and she will continue to benefit from shoulder ER mobility exercises. 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  OBJECTIVE IMPAIRMENTS: decreased ROM, decreased strength, impaired UE functional use, and pain.    ACTIVITY LIMITATIONS: carrying, lifting, bathing, dressing, and reach over head   PARTICIPATION LIMITATIONS: cleaning, laundry, shopping, and occupation   PERSONAL FACTORS: Age, Time since onset of injury/illness/exacerbation, and 1 comorbidity: lumber surgery  are also affecting patient's functional outcome.    REHAB POTENTIAL: Good   CLINICAL DECISION MAKING: Stable/uncomplicated   EVALUATION COMPLEXITY: Low     GOALS: Goals reviewed with patient? No   SHORT TERM GOALS: Target date: 12/03/2022   Pt will be independent with HEP in order to improve strength and balance in order to decrease fall risk and improve function at home and work. Baseline: Not able to do 02/05/23: Performing independently   Goal status: Achieved    2.  Patient will demonstrate knowledge of how to correctly don and doff left shoulder brace in order to be able to do it after surgery.   Baseline: Patient able to perform independently  Goal status: MET       LONG TERM GOALS: Target date: 03/30/2023   Patient will have  improved function and activity level as evidenced by an increase in FOTO score by 10 points or more.  Baseline: 36/100 with target of 59 02/05/23: 49/100 with target 59 Goal status: Achieved    2.  Patient will show symmetrical AROM between left shoulder with right shoulder to regain ability to perform reaching tasks for her job.  Baseline: Shoulder Flexion R/L: 180/90*, Shoulder Abduction R/L 180/60, Shoulder combined IR R/L Thoracic spine, PSIS 02/05/23: See ROM measurements above  Goal status: Ongoing   3. Patient will show symmetrical strength between left shoulder with right shoulder to regain ability to perform reaching tasks for her job.  Baseline: Unable to flex or abduct  LUE past 90 degrees 02/05/23: Still limited with shoulder abduction and IR   Goal status: Ongoing     PLAN:   PT FREQUENCY: 1-2x/week   PT DURATION: 10 weeks   PLANNED INTERVENTIONS: Therapeutic exercises, Neuromuscular re-education, Self Care, Joint mobilization, Joint manipulation, Aquatic Therapy, Dry Needling, Electrical stimulation, Spinal manipulation, Spinal mobilization, Cryotherapy, Moist heat, Manual therapy, and Re-evaluation   PLAN FOR NEXT SESSION: Progress parascapular strengthening exercises : shoulder horizontal abduction (T's), Omega seated Rows, and Lat Pull Downs. Manual: Trigger point release around parascapular musculature.      Ellin Goodie PT, DPT  02/19/2023, 5:37 PM

## 2023-02-23 ENCOUNTER — Encounter: Payer: Self-pay | Admitting: Physical Therapy

## 2023-02-23 ENCOUNTER — Ambulatory Visit: Payer: 59

## 2023-02-23 DIAGNOSIS — M25512 Pain in left shoulder: Secondary | ICD-10-CM | POA: Diagnosis not present

## 2023-02-23 DIAGNOSIS — M19012 Primary osteoarthritis, left shoulder: Secondary | ICD-10-CM | POA: Diagnosis not present

## 2023-02-23 DIAGNOSIS — G8929 Other chronic pain: Secondary | ICD-10-CM

## 2023-02-23 NOTE — Therapy (Signed)
OUTPATIENT PHYSICAL THERAPY TREATMENT NOTE   Patient Name: Sara Perry MRN: [id number] DOB:25-Apr-1961, 62 y.o., female Today's Date: 02/23/2023  PCP: Dr. Dale Cardwell  REFERRING PROVIDER: Dr. Ramond Marrow   END OF SESSION:   PT End of Session - 02/23/23 1723     Visit Number 14    Number of Visits 20    Date for PT Re-Evaluation 03/30/23    Authorization Type [facility] [employer] [id number]    Authorization Time Period 01/28/23-03/30/23    Authorization - Visit Number 14    Authorization - Number of Visits 20    Progress Note Due on Visit 10    PT Start Time 1725    PT Stop Time 1808    PT Time Calculation (min) 43 min    Activity Tolerance Patient tolerated treatment well    Behavior During Therapy WFL for tasks assessed/performed                    Past Medical History:  Diagnosis Date   Arthritis    Complication of anesthesia    pt states she is very sensitive to most drugs, usually needs 1/2 of whatever other people get. She states as a child/teenager she had difficulty waking up after surgery due to the sensitivity that she has to drugs.   Eczema    Frequent UTI    H/O lymphadenopathy    With previous submandibular node removals.   Hypertension    Recurrent sinus infections    Sleep apnea    uses c-pap   Past Surgical History:  Procedure Laterality Date   COLONOSCOPY WITH PROPOFOL N/A 08/25/2017   Procedure: COLONOSCOPY WITH PROPOFOL;  Surgeon: Midge Minium, MD;  Location: [facility];  Service: Endoscopy;  Laterality: N/A;   COLONOSCOPY WITH PROPOFOL N/A 10/17/2022   Procedure: COLONOSCOPY WITH PROPOFOL;  Surgeon: Midge Minium, MD;  Location: [facility];  Service: Endoscopy;  Laterality: N/A;   LUMBAR FUSION  12/26/2019   l4-l5   Lymph Node Removal  1984   NECK SURGERY  1977   H/O Right neck surgery secondary to a benign growth with when patient was 11 or 62 yo   skin lesion extraction  2013   basal cell - Dr Cheree Ditto   TOTAL SHOULDER  ARTHROPLASTY Left 12/18/2022   Procedure: TOTAL SHOULDER ARTHROPLASTY;  Surgeon: Bjorn Pippin, MD;  Location: [facility];  Service: Orthopedics;  Laterality: Left;   Uterine Polyps Removed  2004   Patient Active Problem List   Diagnosis Date Noted   Pre-op evaluation 11/16/2022   History of colonic polyps 10/17/2022   Abdominal bruit 09/27/2021   Osteoarthritis 05/30/2021   Ganglion cyst 01/20/2021   Urinary frequency 09/17/2020   Sleep apnea 06/20/2020   Thumb pain, right 06/20/2020   Lumbar stenosis with neurogenic claudication 12/26/2019   Hyperbilirubinemia 12/14/2019   Eye injury 05/26/2019   Left shoulder pain 05/23/2018   Snoring 05/23/2018   Benign neoplasm of descending colon    Benign neoplasm of ascending colon    Vaginal atrophy 03/22/2017   Meniere's disease 08/17/2016   Colon cancer screening 08/11/2015   Fatigue 06/03/2015   Health care maintenance 06/03/2015   Neck pain 02/05/2014   Hypercholesterolemia 02/05/2014   Back pain 12/05/2012   Trigger finger 12/05/2012   Essential hypertension, benign 12/05/2012    REFERRING DIAG: Left shoulder pain, left shoulder osteoarthritis   THERAPY DIAG:  Chronic left shoulder pain  Primary osteoarthritis of left shoulder  Rationale for Evaluation and Treatment Rehabilitation  PERTINENT HISTORY: Pt reports that she is having left total shoulder surgery on February 22nd. It is a same day surgery. She did PT 4 years ago for her left shoulder and she has also tried PRP and gel injections, but these did not help with her shoulder pain. She has had prior lumbar spine surgery several years ago, and since then she has used arms more rather than rotate, which is why she thinks it caused overuse in left shoulder. She here clicking and popping in shoulder when she reaches above her head followed by pain. She states that she brought her post surgical shoulder brace in and she would like to know more about how to wear it  correctly and what she can and cannot do post surgery like whether she can use her left hand.   PRECAUTIONS: None   SUBJECTIVE:                                                                                                                                                                                      SUBJECTIVE STATEMENT: Patient reports L shoulder feeling good but stiffer than Saturday (5/11). Received a massage on Friday which loosened up shoulder.   PAIN:  Are you having pain? No   OBJECTIVE: (objective measures completed at initial evaluation unless otherwise dated)   DIAGNOSTIC FINDINGS:  CLINICAL DATA:  Chronic left shoulder pain.   EXAM: CT OF THE UPPER LEFT EXTREMITY WITHOUT CONTRAST   TECHNIQUE: Multidetector CT imaging of the upper left extremity was performed according to the standard protocol.   RADIATION DOSE REDUCTION: This exam was performed according to the departmental dose-optimization program which includes automated exposure control, adjustment of the mA and/or kV according to patient size and/or use of iterative reconstruction technique.   COMPARISON:  Left shoulder x-rays dated May 20, 2018.   FINDINGS: Bones/Joint/Cartilage   No fracture or dislocation.   Severe osteoarthritis of the glenohumeral joint with severe joint space narrowing, bone-on-bone appearance, subchondral sclerosis, subchondral cystic changes and marginal osteophytosis. Moderate joint effusion.   Normal acromioclavicular joint.   Ligaments   Ligaments are suboptimally evaluated by CT.   Muscles and Tendons Grossly intact.  No muscle atrophy.   Soft tissue No fluid collection or hematoma. No soft tissue mass. The visualized left lung is clear.   IMPRESSION: 1. Severe left glenohumeral osteoarthritis.     Electronically Signed   By: Obie Dredge M.D.   On: 11/04/2022 09:32   PATIENT SURVEYS:  FOTO 36/100 with target of 59    COGNITION: Overall  cognitive status: Within functional limits for tasks assessed  SENSATION: WFL   POSTURE: No abnormalities noted    UPPER EXTREMITY ROM:    Active / PROM ROM Right eval Left eval 02/05/23 Right  02/05/23 Left   Shoulder flexion 180/180 90/180 180/180 160/150  Shoulder extension        Shoulder abduction 180/180 60*/60* 180/180 90*/160*  Shoulder adduction        Shoulder internal rotation (combined)  Thoracic Spine  PSIS     Shoulder external rotation     Occiput Occiput   Elbow flexion        Elbow extension        Wrist flexion        Wrist extension        Wrist ulnar deviation        Wrist radial deviation        Wrist pronation        Wrist supination        (Blank rows = not tested)   UPPER EXTREMITY MMT:   MMT Right eval Left eval Right 02/05/23 Left  02/05/23  Shoulder flexion 5 3+* 4+ 4  Shoulder extension        Shoulder abduction  5 3+  4 4-  Shoulder adduction 5 3+*    Shoulder internal rotation     4 4-  Shoulder external rotation     4 4-  Middle trapezius     4- 3+  Lower trapezius     4- 3-  Elbow flexion        Elbow extension        Wrist flexion        Wrist extension        Wrist ulnar deviation        Wrist radial deviation        Wrist pronation        Wrist supination        Grip strength (lbs)        (Blank rows = not tested)       JOINT MOBILITY TESTING:  Crepitus and firm end field at 90 degrees flexion and abduction    PALPATION:  TTP around entire left shoulder joint              TODAY'S TREATMENT:                             DATE:   02/23/23:  UBE seat 8 for 6 mins (3 min fwd and 3 min bwd) Manual: STM parascapular musculature and UT  OMEGA seated row 2 x 10 15# OMEGA Lat pull down 3 x 10 15#  Rhythmic stabilization with ball circle clockwise/counterclockwise 2 x 30 seconds each direction   02/19/23: All exercises performed on left shoulder  UBE seat at 8 for 5 min (2.5 min forward and  2.5 min backward) Seated Shoulder ER PROM Stretch 3 x 30 sec    02/17/23: Left Shoulder  UBE seat at 8 for 5 min (2.5 min forward and 2.5 min backward)  OMEGA Seated Rows #20 3 x 10  D2 PNF Flexion/Extension AROM 3 x 10  D1 PNF Flexion/Extension Red Band  1 x 10  D1 PNF Flexion/Extension Yellow Band 2 x 10  Field Goals 3 x 10 with mirror for visual input  Shoulder W's AROM 3 x 10   02/09/23: UBE seat at 8 for 5 min (2.5 min forward and 2.5 min backward)  OMEGA Seated  Rows #15 1 x 10  OMEGA Seated Rows #15 with pillows to increase height of sitting position 2 x 10  Rhythmic Stabilization in Quadruped with red ball 1 x 10 clockwise circles and 1 x 10 counter clockwise circles   Rhythmic Stabilization in Supine with #3 DB 3 x 10 clockwise circles and 3 x 10 counter clockwise circles     02/05/23: UBE seat at 9 for 5 min (2.5 min forward and 2.5 min backward)  See ROM measurements above  FOTO: 49/100  Shoulder MMT  Shoulder Flex R/L 4+/4 Shoulder Abduction R/L 4/4- Mid Trap R/L 4-/3+ Shoulder Ext R/L 4-/3+ Shoulder Lower Trap R/L 4-/3- Shoulder ER at 90 degrees R/L 4/4- Shoulder IR at 90 degrees R/L 4/3+ Standing Shoulder Abduction on LUE only able to achieve 90 deg AROM 1 x 10 Supine Shoulder Abduction on LUE able to achieve 160 degrees AROM 1 x 10    02/03/23: UBE seat at 8 for 5 min  Seated Rows with Black Band 1 x 10  Seated Rows with Gray Band 1 x 10  Seated on Incline Bolster AAROM ER in scaption plane 3 x 10 Supine Biceps Stretch on LUE 3 x 30 sec  Shoulder Extension to neutral AROM on counter 2 x 10    -mod VC for sequence of exercise    Trigger Point Dry-Needling  Treatment instructions: Expect mild to moderate muscle soreness. S/S of pneumothorax if dry needled over a lung field, and to seek immediate medical attention should they occur. Patient verbalized understanding of these instructions and education.  Patient Consent Given: Yes Education handout provided:  No Muscles treated: Infraspinatus and left upper trap  Electrical stimulation performed: No Parameters: N/A Treatment response/outcome: Twitch in left infraspinatus muscle  Shoulder Flex PROM on LUE 160 degrees  Shoulder ER PROM on LUE 60 degrees Shoulder IR PROM on LUE 80 degrees  Shoulder AROM Flexion/Extension in sitting 1 x 10  Shoulder AROM Abduction/ Adduction in sitting 1 x 10     PATIENT EDUCATION: Education details: form and technique for appropriate exercise and explanation about post rehab protocol  Person educated: Patient Education method: Explanation, Demonstration, Verbal cues, and Handouts Education comprehension: verbalized understanding, returned demonstration, and verbal cues required   HOME EXERCISE PROGRAM: Access Code: [email or web address] URL: [email or web address] Date: 02/17/2023 Prepared by: Ellin Goodie  Exercises - Seated Upper Trapezius Stretch  - 1 x daily - 3 reps - 30-60 sec hold - Seated Levator Scapulae Stretch  - 1 x daily - 3 reps - 30 sec  hold - Seated Single Arm Shoulder Row with Anchored Resistance  - 3 x weekly - 3 sets - 10 reps - Isometric Shoulder External Rotation  - 3 x weekly - 3 sets - 10 reps - 5 sec  hold - Doorway Rhomboid Stretch  - 1 x daily - 3 reps - 30 sec  hold - Standing Sleeper Stretch at [employer]  - 1 x daily - 3 reps - 30 sec  hold - Shoulder Abduction Towel Slide on Railing   - 1 x daily - 3 sets - 10 reps - Supine Shoulder External Rotation in 45 Degrees Abduction AAROM with Dowel  - 1 x daily - 3 sets - 10 reps - 3 sec hold hold - Supine Shoulder Flexion Extension Full Range AROM  - 3 x weekly - 3 sets - 10 reps - Supine Shoulder Abduction AROM  - 3 x weekly - 3 sets - 10 reps - Supine Shoulder  Rhythmic Stabilization-circles clockwise and counter clockwise   - 3 x weekly - 3 sets - 10 reps - Seated Row Cable Machine  - 3 x weekly - 3 sets - 10 reps - Supine Shoulder External Rotation Stretch  - 1 x daily - 3 reps -  60 sec  hold - Standing Shoulder Horizontal Abduction but do not use resistance   - 3 x weekly - 3 sets - 10 reps - Shoulder PNF D2 Flexion  - 3 x weekly - 3 sets - 10 reps - Standing Single Arm Shoulder PNF D1 Extension with Anchored Resistance  - 3 x weekly - 3 sets - 10 reps - Standing Shoulder External and Internal Rotation AROM  - 3 x weekly - 3 sets - 10 reps - Standing Shoulder Scaption  - 3 x weekly - 3 sets - 10 reps   ASSESSMENT:   CLINICAL IMPRESSION: Patient continues to show improvement in L shoulder ROM and strength. Session focused on STM of L parascapular musculature with noted increased tightness primarily on scapular border and UT. Worked on rhythmic stabilization of L shoulder musculature with complaints of muscle burning during counterclockwise ball circle on wall. Will focus on shoulder ER next session to improve ROM. Patient will continue to benefit from skilled therapy to address remaining deficits in order to improve quality of life and return to PLOF.     OBJECTIVE IMPAIRMENTS: decreased ROM, decreased strength, impaired UE functional use, and pain.    ACTIVITY LIMITATIONS: carrying, lifting, bathing, dressing, and reach over head   PARTICIPATION LIMITATIONS: cleaning, laundry, shopping, and occupation   PERSONAL FACTORS: Age, Time since onset of injury/illness/exacerbation, and 1 comorbidity: lumber surgery  are also affecting patient's functional outcome.    REHAB POTENTIAL: Good   CLINICAL DECISION MAKING: Stable/uncomplicated   EVALUATION COMPLEXITY: Low     GOALS: Goals reviewed with patient? No   SHORT TERM GOALS: Target date: 12/03/2022   Pt will be independent with HEP in order to improve strength and balance in order to decrease fall risk and improve function at home and work. Baseline: Not able to do 02/05/23: Performing independently   Goal status: Achieved    2.  Patient will demonstrate knowledge of how to correctly don and doff left shoulder  brace in order to be able to do it after surgery.   Baseline: Patient able to perform independently  Goal status: MET       LONG TERM GOALS: Target date: 03/30/2023   Patient will have improved function and activity level as evidenced by an increase in FOTO score by 10 points or more.  Baseline: 36/100 with target of 59 02/05/23: 49/100 with target 59 Goal status: Achieved    2.  Patient will show symmetrical AROM between left shoulder with right shoulder to regain ability to perform reaching tasks for her job.  Baseline: Shoulder Flexion R/L: 180/90*, Shoulder Abduction R/L 180/60, Shoulder combined IR R/L Thoracic spine, PSIS 02/05/23: See ROM measurements above  Goal status: Ongoing   3. Patient will show symmetrical strength between left shoulder with right shoulder to regain ability to perform reaching tasks for her job.  Baseline: Unable to flex or abduct LUE past 90 degrees 02/05/23: Still limited with shoulder abduction and IR   Goal status: Ongoing     PLAN:   PT FREQUENCY: 1-2x/week   PT DURATION: 10 weeks   PLANNED INTERVENTIONS: Therapeutic exercises, Neuromuscular re-education, Self Care, Joint mobilization, Joint manipulation, Aquatic Therapy, Dry Needling, Electrical stimulation,  Spinal manipulation, Spinal mobilization, Cryotherapy, Moist heat, Manual therapy, and Re-evaluation   PLAN FOR NEXT SESSION: ER PROM/AROM Progress parascapular strengthening exercises : shoulder horizontal abduction (T's), Omega seated Rows, and Lat Pull Downs. Manual: Trigger point release around parascapular musculature.      Maylon Peppers, PT, DPT Physical Therapist - [facility]  02/23/2023, 6:11 PM

## 2023-02-26 ENCOUNTER — Ambulatory Visit: Payer: 59

## 2023-02-26 DIAGNOSIS — M19012 Primary osteoarthritis, left shoulder: Secondary | ICD-10-CM

## 2023-02-26 DIAGNOSIS — M25512 Pain in left shoulder: Secondary | ICD-10-CM | POA: Diagnosis not present

## 2023-02-26 DIAGNOSIS — G8929 Other chronic pain: Secondary | ICD-10-CM

## 2023-02-26 NOTE — Therapy (Signed)
OUTPATIENT PHYSICAL THERAPY TREATMENT NOTE   Patient Name: Sara Perry MRN: [id number] DOB:[birth year], 62 y.o., female Today's Date: 02/26/2023  PCP: Dr. Dale [place]  REFERRING PROVIDER: Dr. Ramond Marrow   END OF SESSION:   PT End of Session - 02/26/23 1652     Visit Number 15    Number of Visits 20    Date for PT Re-Evaluation 03/30/23    Authorization Type [facility] [employer] [id number]    Authorization Time Period 01/28/23-03/30/23    Authorization - Visit Number 15    Authorization - Number of Visits 20    Progress Note Due on Visit 10    PT Start Time 1650    PT Stop Time 1742    PT Time Calculation (min) 52 min    Activity Tolerance Patient tolerated treatment well    Behavior During Therapy WFL for tasks assessed/performed              Past Medical History:  Diagnosis Date   Arthritis    Complication of anesthesia    pt states she is very sensitive to most drugs, usually needs 1/2 of whatever other people get. She states as a child/teenager she had difficulty waking up after surgery due to the sensitivity that she has to drugs.   Eczema    Frequent UTI    H/O lymphadenopathy    With previous submandibular node removals.   Hypertension    Recurrent sinus infections    Sleep apnea    uses c-pap   Past Surgical History:  Procedure Laterality Date   COLONOSCOPY WITH PROPOFOL N/A 08/25/2017   Procedure: COLONOSCOPY WITH PROPOFOL;  Surgeon: Midge Minium, MD;  Location: [facility] END[facility];  Service: Endoscopy;  Laterality: N/A;   COLONOSCOPY WITH PROPOFOL N/A 10/17/2022   Procedure: COLONOSCOPY WITH PROPOFOL;  Surgeon: Midge Minium, MD;  Location: [facility];  Service: Endoscopy;  Laterality: N/A;   LUMBAR FUSION  12/26/2019   l4-l5   Lymph Node Removal  1984   NECK SURGERY  1977   H/O Right neck surgery secondary to a benign growth with when patient was 69 or 62 yo   skin lesion extraction  2013   basal cell - Dr Cheree Ditto   TOTAL SHOULDER ARTHROPLASTY Left  12/18/2022   Procedure: TOTAL SHOULDER ARTHROPLASTY;  Surgeon: Bjorn Pippin, MD;  Location: [facility];  Service: Orthopedics;  Laterality: Left;   Uterine Polyps Removed  2004   Patient Active Problem List   Diagnosis Date Noted   Pre-op evaluation 11/16/2022   History of colonic polyps 10/17/2022   Abdominal bruit 09/27/2021   Osteoarthritis 05/30/2021   Ganglion cyst 01/20/2021   Urinary frequency 09/17/2020   Sleep apnea 06/20/2020   Thumb pain, right 06/20/2020   Lumbar stenosis with neurogenic claudication 12/26/2019   Hyperbilirubinemia 12/14/2019   Eye injury 05/26/2019   Left shoulder pain 05/23/2018   Snoring 05/23/2018   Benign neoplasm of descending colon    Benign neoplasm of ascending colon    Vaginal atrophy 03/22/2017   Meniere's disease 08/17/2016   Colon cancer screening 08/11/2015   Fatigue 06/03/2015   Health care maintenance 06/03/2015   Neck pain 02/05/2014   Hypercholesterolemia 02/05/2014   Back pain 12/05/2012   Trigger finger 12/05/2012   Essential hypertension, benign 12/05/2012    REFERRING DIAG: Left shoulder pain, left shoulder osteoarthritis   THERAPY DIAG:  Chronic left shoulder pain  Primary osteoarthritis of left shoulder  Rationale for Evaluation and Treatment Rehabilitation  PERTINENT HISTORY: Pt reports that she is having left total shoulder surgery on February 22nd. It is a same day surgery. She did PT 4 years ago for her left shoulder and she has also tried PRP and gel injections, but these did not help with her shoulder pain. She has had prior lumbar spine surgery several years ago, and since then she has used arms more rather than rotate, which is why she thinks it caused overuse in left shoulder. She here clicking and popping in shoulder when she reaches above her head followed by pain. She states that she brought her post surgical shoulder brace in and she would like to know more about how to wear it correctly and what  she can and cannot do post surgery like whether she can use her left hand.   PRECAUTIONS: None   SUBJECTIVE:                                                                                                                                                                                      SUBJECTIVE STATEMENT: Pt reports she is doing well other than weakness in the arm.  Pt notes a server position (elbow at side, and flexed to 90 deg) to be painful when kept there for extended minutes.  Pt also noting some tenderness in the extensor bundle of the L forearm.  PAIN:  Are you having pain? No   OBJECTIVE: (objective measures completed at initial evaluation unless otherwise dated)   DIAGNOSTIC FINDINGS:  CLINICAL DATA:  Chronic left shoulder pain.   EXAM: CT OF THE UPPER LEFT EXTREMITY WITHOUT CONTRAST   TECHNIQUE: Multidetector CT imaging of the upper left extremity was performed according to the standard protocol.   RADIATION DOSE REDUCTION: This exam was performed according to the departmental dose-optimization program which includes automated exposure control, adjustment of the mA and/or kV according to patient size and/or use of iterative reconstruction technique.   COMPARISON:  Left shoulder x-rays dated May 20, 2018.   FINDINGS: Bones/Joint/Cartilage   No fracture or dislocation.   Severe osteoarthritis of the glenohumeral joint with severe joint space narrowing, bone-on-bone appearance, subchondral sclerosis, subchondral cystic changes and marginal osteophytosis. Moderate joint effusion.   Normal acromioclavicular joint.   Ligaments   Ligaments are suboptimally evaluated by CT.   Muscles and Tendons Grossly intact.  No muscle atrophy.   Soft tissue No fluid collection or hematoma. No soft tissue mass. The visualized left lung is clear.   IMPRESSION: 1. Severe left glenohumeral osteoarthritis.     Electronically Signed   By: Obie Dredge M.D.   On:  11/04/2022 09:32   PATIENT SURVEYS:  FOTO 36/100 with target of 59    COGNITION: Overall cognitive status: Within functional limits for tasks assessed                                  SENSATION: WFL   POSTURE: No abnormalities noted    UPPER EXTREMITY ROM:    Active / PROM ROM Right eval Left eval 02/05/23 Right  02/05/23 Left   Shoulder flexion 180/180 90/180 180/180 160/150  Shoulder extension        Shoulder abduction 180/180 60*/60* 180/180 90*/160*  Shoulder adduction        Shoulder internal rotation (combined)  Thoracic Spine  PSIS     Shoulder external rotation     Occiput Occiput   Elbow flexion        Elbow extension        Wrist flexion        Wrist extension        Wrist ulnar deviation        Wrist radial deviation        Wrist pronation        Wrist supination        (Blank rows = not tested)   UPPER EXTREMITY MMT:   MMT Right eval Left eval Right 02/05/23 Left  02/05/23  Shoulder flexion 5 3+* 4+ 4  Shoulder extension        Shoulder abduction  5 3+  4 4-  Shoulder adduction 5 3+*    Shoulder internal rotation     4 4-  Shoulder external rotation     4 4-  Middle trapezius     4- 3+  Lower trapezius     4- 3-  Elbow flexion        Elbow extension        Wrist flexion        Wrist extension        Wrist ulnar deviation        Wrist radial deviation        Wrist pronation        Wrist supination        Grip strength (lbs)        (Blank rows = not tested)       JOINT MOBILITY TESTING:  Crepitus and firm end field at 90 degrees flexion and abduction    PALPATION:  TTP around entire left shoulder joint              TODAY'S TREATMENT:                             DATE:   02/26/23  TherEx:  UBE seat  for 6 mins (2.5 min fwd and 2.5 min bwd) OMEGA seated row, 20#, 2x10  OMEGA Lat pull down, 15#, 2x10 with verbal cuing for proper technique   Manual:  STM with TP release technique applied to extensor carpi ulnaris, extensor  digitorum, and brachioradialis of the L arm as pt has been having difficulty with this pain due to job duties STM with TP release technique applied to infraspinatus, teres major/minor region for reduction in painful movements within the shoulder  Trigger Point Dry-Needling  Treatment instructions: Expect mild to moderate muscle soreness. S/S of pneumothorax if dry needled over a lung field, and to seek immediate medical attention should they occur. Patient verbalized understanding of  these instructions and education.  Patient Consent Given: Yes Education handout provided: Previously provided Muscles treated: extensor carpi ulnaris, extensor digitorum, brachioradialis, infraspinatus, teres major/minor complex Electrical stimulation performed: No Parameters: N/A Treatment response/outcome: Pt had reduction in TP's following needling and was able to increase wrist extension ROM although sore following the treatment.     02/23/23:  UBE seat  for 6 mins (2.5 min fwd and 2.5 min bwd) Manual: STM parascapular musculature and UT  OMEGA seated row 2 x 10 15# OMEGA Lat pull down 3 x 10 15#  Rhythmic stabilization with ball circle clockwise/counterclockwise 2 x 30 seconds each direction       PATIENT EDUCATION: Education details: form and technique for appropriate exercise and explanation about post rehab protocol  Person educated: Patient Education method: Explanation, Demonstration, Verbal cues, and Handouts Education comprehension: verbalized understanding, returned demonstration, and verbal cues required   HOME EXERCISE PROGRAM: Access Code: [email or web address] URL: [email or web address] Date: 02/17/2023 Prepared by: Ellin Goodie  Exercises - Seated Upper Trapezius Stretch  - 1 x daily - 3 reps - 30-60 sec hold - Seated Levator Scapulae Stretch  - 1 x daily - 3 reps - 30 sec  hold - Seated Single Arm Shoulder Row with Anchored Resistance  - 3 x weekly - 3 sets - 10 reps -  Isometric Shoulder External Rotation  - 3 x weekly - 3 sets - 10 reps - 5 sec  hold - Doorway Rhomboid Stretch  - 1 x daily - 3 reps - 30 sec  hold - Standing Sleeper Stretch at [employer]  - 1 x daily - 3 reps - 30 sec  hold - Shoulder Abduction Towel Slide on Railing   - 1 x daily - 3 sets - 10 reps - Supine Shoulder External Rotation in 45 Degrees Abduction AAROM with Dowel  - 1 x daily - 3 sets - 10 reps - 3 sec hold hold - Supine Shoulder Flexion Extension Full Range AROM  - 3 x weekly - 3 sets - 10 reps - Supine Shoulder Abduction AROM  - 3 x weekly - 3 sets - 10 reps - Supine Shoulder Rhythmic Stabilization-circles clockwise and counter clockwise   - 3 x weekly - 3 sets - 10 reps - Seated Row Cable Machine  - 3 x weekly - 3 sets - 10 reps - Supine Shoulder External Rotation Stretch  - 1 x daily - 3 reps - 60 sec  hold - Standing Shoulder Horizontal Abduction but do not use resistance   - 3 x weekly - 3 sets - 10 reps - Shoulder PNF D2 Flexion  - 3 x weekly - 3 sets - 10 reps - Standing Single Arm Shoulder PNF D1 Extension with Anchored Resistance  - 3 x weekly - 3 sets - 10 reps - Standing Shoulder External and Internal Rotation AROM  - 3 x weekly - 3 sets - 10 reps - Standing Shoulder Scaption  - 3 x weekly - 3 sets - 10 reps   ASSESSMENT:   CLINICAL IMPRESSION:  Pt performed well with the exercises today and was able to improve 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  Pt noted to be having increased pain in the lateral elbow and concerned that it may be radiating from the arm.  Pt had multiple TP's noted to be causing pain in the extensor bundle and brachioradialis.  Pt also still experiencing some increased pain in the infraspinatus and teres major/minor complex.  Pt noted increased soreness after completion and was advised to continue to drink plenty of water and stretch the areas in order to alleviate some of the sores to be expected.  Pt agreeable with this education and  pt will continue to benefit from skilled therapy to address remaining deficits in order to improve overall QoL and return to PLOF.     OBJECTIVE IMPAIRMENTS: decreased ROM, decreased strength, impaired UE functional use, and pain.    ACTIVITY LIMITATIONS: carrying, lifting, bathing, dressing, and reach over head   PARTICIPATION LIMITATIONS: cleaning, laundry, shopping, and occupation   PERSONAL FACTORS: Age, Time since onset of injury/illness/exacerbation, and 1 comorbidity: lumber surgery  are also affecting patient's functional outcome.    REHAB POTENTIAL: Good   CLINICAL DECISION MAKING: Stable/uncomplicated   EVALUATION COMPLEXITY: Low     GOALS: Goals reviewed with patient? No   SHORT TERM GOALS: Target date: 12/03/2022   Pt will be independent with HEP in order to improve strength and balance in order to decrease fall risk and improve function at home and work. Baseline: Not able to do 02/05/23: Performing independently   Goal status: Achieved    2.  Patient will demonstrate knowledge of how to correctly don and doff left shoulder brace in order to be able to do it after surgery.   Baseline: Patient able to perform independently  Goal status: MET       LONG TERM GOALS: Target date: 03/30/2023   Patient will have improved function and activity level as evidenced by an increase in FOTO score by 10 points or more.  Baseline: 36/100 with target of 59 02/05/23: 49/100 with target 59 Goal status: Achieved    2.  Patient will show symmetrical AROM between left shoulder with right shoulder to regain ability to perform reaching tasks for her job.  Baseline: Shoulder Flexion R/L: 180/90*, Shoulder Abduction R/L 180/60, Shoulder combined IR R/L Thoracic spine, PSIS 02/05/23: See ROM measurements above  Goal status: Ongoing   3. Patient will show symmetrical strength between left shoulder with right shoulder to regain ability to perform reaching tasks for her job.  Baseline: Unable  to flex or abduct LUE past 90 degrees 02/05/23: Still limited with shoulder abduction and IR   Goal status: Ongoing     PLAN:   PT FREQUENCY: 1-2x/week   PT DURATION: 10 weeks   PLANNED INTERVENTIONS: Therapeutic exercises, Neuromuscular re-education, Self Care, Joint mobilization, Joint manipulation, Aquatic Therapy, Dry Needling, Electrical stimulation, Spinal manipulation, Spinal mobilization, Cryotherapy, Moist heat, Manual therapy, and Re-evaluation   PLAN FOR NEXT SESSION: ER PROM/AROM Progress parascapular strengthening exercises : shoulder horizontal abduction (T's), Omega seated Rows, and Lat Pull Downs. Manual: Trigger point release around parascapular musculature.       Nolon Bussing, PT, DPT Physical Therapist - [facility]  02/26/23, 6:16 PM

## 2023-03-02 ENCOUNTER — Encounter: Payer: 59 | Admitting: Physical Therapy

## 2023-03-02 ENCOUNTER — Other Ambulatory Visit: Payer: Self-pay | Admitting: Internal Medicine

## 2023-03-02 DIAGNOSIS — Z1231 Encounter for screening mammogram for malignant neoplasm of breast: Secondary | ICD-10-CM

## 2023-03-03 ENCOUNTER — Ambulatory Visit: Payer: 59 | Admitting: Physical Therapy

## 2023-03-03 DIAGNOSIS — G8929 Other chronic pain: Secondary | ICD-10-CM | POA: Diagnosis not present

## 2023-03-03 DIAGNOSIS — M19012 Primary osteoarthritis, left shoulder: Secondary | ICD-10-CM

## 2023-03-03 DIAGNOSIS — M25512 Pain in left shoulder: Secondary | ICD-10-CM | POA: Diagnosis not present

## 2023-03-03 NOTE — Therapy (Unsigned)
OUTPATIENT PHYSICAL THERAPY TREATMENT NOTE   Patient Name: Sara Perry MRN: [id number] DOB:[birth year], 62 y.o., female Today's Date: 03/04/2023  PCP: Dr. Dale Quincy  REFERRING PROVIDER: Dr. Ramond Marrow   END OF SESSION:   PT End of Session - 03/03/23 1746     Visit Number 16    Number of Visits 20    Date for PT Re-Evaluation 03/30/23    Authorization Type [facility] [employer] [id number]    Authorization Time Period 01/28/23-03/30/23    Authorization - Visit Number 16    Authorization - Number of Visits 20    Progress Note Due on Visit 10    PT Start Time 1745    PT Stop Time 1815    PT Time Calculation (min) 30 min    Activity Tolerance Patient tolerated treatment well    Behavior During Therapy WFL for tasks assessed/performed              Past Medical History:  Diagnosis Date   Arthritis    Complication of anesthesia    pt states she is very sensitive to most drugs, usually needs 1/2 of whatever other people get. She states as a child/teenager she had difficulty waking up after surgery due to the sensitivity that she has to drugs.   Eczema    Frequent UTI    H/O lymphadenopathy    With previous submandibular node removals.   Hypertension    Recurrent sinus infections    Sleep apnea    uses c-pap   Past Surgical History:  Procedure Laterality Date   COLONOSCOPY WITH PROPOFOL N/A 08/25/2017   Procedure: COLONOSCOPY WITH PROPOFOL;  Surgeon: Midge Minium, MD;  Location: [facility] END[facility];  Service: Endoscopy;  Laterality: N/A;   COLONOSCOPY WITH PROPOFOL N/A 10/17/2022   Procedure: COLONOSCOPY WITH PROPOFOL;  Surgeon: Midge Minium, MD;  Location: [facility];  Service: Endoscopy;  Laterality: N/A;   LUMBAR FUSION  12/26/2019   l4-l5   Lymph Node Removal  1984   NECK SURGERY  1977   H/O Right neck surgery secondary to a benign growth with when patient was 48 or 62 yo   skin lesion extraction  2013   basal cell - Dr Cheree Ditto   TOTAL SHOULDER ARTHROPLASTY Left  12/18/2022   Procedure: TOTAL SHOULDER ARTHROPLASTY;  Surgeon: Bjorn Pippin, MD;  Location: [facility];  Service: Orthopedics;  Laterality: Left;   Uterine Polyps Removed  2004   Patient Active Problem List   Diagnosis Date Noted   Pre-op evaluation 11/16/2022   History of colonic polyps 10/17/2022   Abdominal bruit 09/27/2021   Osteoarthritis 05/30/2021   Ganglion cyst 01/20/2021   Urinary frequency 09/17/2020   Sleep apnea 06/20/2020   Thumb pain, right 06/20/2020   Lumbar stenosis with neurogenic claudication 12/26/2019   Hyperbilirubinemia 12/14/2019   Eye injury 05/26/2019   Left shoulder pain 05/23/2018   Snoring 05/23/2018   Benign neoplasm of descending colon    Benign neoplasm of ascending colon    Vaginal atrophy 03/22/2017   Meniere's disease 08/17/2016   Colon cancer screening 08/11/2015   Fatigue 06/03/2015   Health care maintenance 06/03/2015   Neck pain 02/05/2014   Hypercholesterolemia 02/05/2014   Back pain 12/05/2012   Trigger finger 12/05/2012   Essential hypertension, benign 12/05/2012    REFERRING DIAG: Left shoulder pain, left shoulder osteoarthritis   THERAPY DIAG:  Chronic left shoulder pain  Primary osteoarthritis of left shoulder  Rationale for Evaluation and Treatment Rehabilitation  PERTINENT HISTORY: Pt reports that she is having left total shoulder surgery on February 22nd. It is a same day surgery. She did PT 4 years ago for her left shoulder and she has also tried PRP and gel injections, but these did not help with her shoulder pain. She has had prior lumbar spine surgery several years ago, and since then she has used arms more rather than rotate, which is why she thinks it caused overuse in left shoulder. She here clicking and popping in shoulder when she reaches above her head followed by pain. She states that she brought her post surgical shoulder brace in and she would like to know more about how to wear it correctly and what  she can and cannot do post surgery like whether she can use her left hand.   PRECAUTIONS: None   SUBJECTIVE:                                                                                                                                                                                      SUBJECTIVE STATEMENT: Pt reports that she has been doing well at work. 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 She continues to have ongoing trigger points, but these resolve with massage.  PAIN:  Are you having pain? No   OBJECTIVE: (objective measures completed at initial evaluation unless otherwise dated)   DIAGNOSTIC FINDINGS:  CLINICAL DATA:  Chronic left shoulder pain.   EXAM: CT OF THE UPPER LEFT EXTREMITY WITHOUT CONTRAST   TECHNIQUE: Multidetector CT imaging of the upper left extremity was performed according to the standard protocol.   RADIATION DOSE REDUCTION: This exam was performed according to the departmental dose-optimization program which includes automated exposure control, adjustment of the mA and/or kV according to patient size and/or use of iterative reconstruction technique.   COMPARISON:  Left shoulder x-rays dated May 20, 2018.   FINDINGS: Bones/Joint/Cartilage   No fracture or dislocation.   Severe osteoarthritis of the glenohumeral joint with severe joint space narrowing, bone-on-bone appearance, subchondral sclerosis, subchondral cystic changes and marginal osteophytosis. Moderate joint effusion.   Normal acromioclavicular joint.   Ligaments   Ligaments are suboptimally evaluated by CT.   Muscles and Tendons Grossly intact.  No muscle atrophy.   Soft tissue No fluid collection or hematoma. No soft tissue mass. The visualized left lung is clear.   IMPRESSION: 1. Severe left glenohumeral osteoarthritis.     Electronically Signed   By: Obie Dredge M.D.   On: 11/04/2022 09:32    PATIENT SURVEYS:  FOTO 36/100 with target of 59  COGNITION: Overall cognitive status: Within functional limits for tasks assessed                                  SENSATION: WFL   POSTURE: No abnormalities noted    UPPER EXTREMITY ROM:    Active / PROM ROM Right eval Left eval 02/05/23 Right  02/05/23 Left   Shoulder flexion 180/180 90/180 180/180 160/150  Shoulder extension        Shoulder abduction 180/180 60*/60* 180/180 90*/160*  Shoulder adduction        Shoulder internal rotation (combined)  Thoracic Spine  PSIS     Shoulder external rotation     Occiput Occiput   Elbow flexion        Elbow extension        Wrist flexion        Wrist extension        Wrist ulnar deviation        Wrist radial deviation        Wrist pronation        Wrist supination        (Blank rows = not tested)   UPPER EXTREMITY MMT:   MMT Right eval Left eval Right 02/05/23 Left  02/05/23  Shoulder flexion 5 3+* 4+ 4  Shoulder extension        Shoulder abduction  5 3+  4 4-  Shoulder adduction 5 3+*    Shoulder internal rotation     4 4-  Shoulder external rotation     4 4-  Middle trapezius     4- 3+  Lower trapezius     4- 3-  Elbow flexion        Elbow extension        Wrist flexion        Wrist extension        Wrist ulnar deviation        Wrist radial deviation        Wrist pronation        Wrist supination        Grip strength (lbs)        (Blank rows = not tested)       JOINT MOBILITY TESTING:  Crepitus and firm end field at 90 degrees flexion and abduction    PALPATION:  TTP around entire left shoulder joint              TODAY'S TREATMENT:                             DATE:   03/03/23  THEREX  UBE Seat at 5 with resistance at 3 for 5 min  Shoulder Scaption #1 3 x 10  Discussion about low back pain and need for ongoing stretches. -Seated forward flexion  -Butterfly forward flexion  Thomas Test: Negative Bilaterally    MANUAL   Soft tissue massage and  trigger point release of left parascapular, RTC, and upper trap.    02/26/23  TherEx:  UBE seat  for 6 mins (2.5 min fwd and 2.5 min bwd) OMEGA seated row, 20#, 2x10  OMEGA Lat pull down, 15#, 2x10 with verbal cuing for proper technique   Manual:  STM with TP release technique applied to extensor carpi ulnaris, extensor digitorum, and brachioradialis of the L arm as pt has been having difficulty with this pain  due to job duties STM with TP release technique applied to infraspinatus, teres major/minor region for reduction in painful movements within the shoulder  Trigger Point Dry-Needling  Treatment instructions: Expect mild to moderate muscle soreness. S/S of pneumothorax if dry needled over a lung field, and to seek immediate medical attention should they occur. Patient verbalized understanding of these instructions and education.  Patient Consent Given: Yes Education handout provided: Previously provided Muscles treated: extensor carpi ulnaris, extensor digitorum, brachioradialis, infraspinatus, teres major/minor complex Electrical stimulation performed: No Parameters: N/A Treatment response/outcome: Pt had reduction in TP's following needling and was able to increase wrist extension ROM although sore following the treatment.     02/23/23:  UBE seat  for 6 mins (2.5 min fwd and 2.5 min bwd) Manual: STM parascapular musculature and UT  OMEGA seated row 2 x 10 15# OMEGA Lat pull down 3 x 10 15#  Rhythmic stabilization with ball circle clockwise/counterclockwise 2 x 30 seconds each direction    PATIENT EDUCATION: Education details: form and technique for appropriate exercise and explanation about post rehab protocol  Person educated: Patient Education method: Explanation, Demonstration, Verbal cues, and Handouts Education comprehension: verbalized understanding, returned demonstration, and verbal cues required   HOME EXERCISE PROGRAM: Access Code: [email or web address] URL:  [email or web address] Date: 03/03/2023 Prepared by: Ellin Goodie  Exercises - Seated Upper Trapezius Stretch  - 1 x daily - 3 reps - 30-60 sec hold - Seated Levator Scapulae Stretch  - 1 x daily - 3 reps - 30 sec  hold - Seated Single Arm Shoulder Row with Anchored Resistance  - 3 x weekly - 3 sets - 10 reps - Isometric Shoulder External Rotation  - 3 x weekly - 3 sets - 10 reps - 5 sec  hold - Doorway Rhomboid Stretch  - 1 x daily - 3 reps - 30 sec  hold - Standing Sleeper Stretch at [employer]  - 1 x daily - 3 reps - 30 sec  hold - Supine Shoulder Rhythmic Stabilization-circles clockwise and counter clockwise   - 3 x weekly - 3 sets - 10 reps - Seated Row Cable Machine  - 3 x weekly - 3 sets - 10 reps - Standing Shoulder Horizontal Abduction but do not use resistance   - 3 x weekly - 3 sets - 10 reps - Shoulder PNF D2 Flexion  - 3 x weekly - 3 sets - 10 reps - Standing Single Arm Shoulder PNF D1 Extension with Anchored Resistance  - 3 x weekly - 3 sets - 10 reps - Standing Shoulder External and Internal Rotation AROM  - 3 x weekly - 3 sets - 10 reps - Standing Shoulder Scaption  - 3 x weekly - 3 sets - 10 reps - Seated Shoulder External Rotation PROM on Table  - 1 x daily - 3 reps - 30 sec  hold - Standing Shoulder Abduction Finger Walk at Wall  - 1 x daily - 1 sets - 10 reps - 10 sec  hold - Scaption with Dumbbells  - 3 x weekly - 3 sets - 10 reps    ASSESSMENT:   CLINICAL IMPRESSION: Pt continues to be limited in external rotation ROM, but she does demonstrate improvement in AROM in scapular plane with introduction of resistance. Pt continues to experience improved function in left shoulder with ability to tolerate full day of work. As result of increased utilization of left shoulder, she has increased trigger points around upper trap and parascapular musculature that resolve with  soft tissue massage and trigger point release. Pt agreeable with this education and pt will  continue to benefit from skilled therapy to address remaining deficits in order to improve overall QoL and return to PLOF.   OBJECTIVE IMPAIRMENTS: decreased ROM, decreased strength, impaired UE functional use, and pain.    ACTIVITY LIMITATIONS: carrying, lifting, bathing, dressing, and reach over head   PARTICIPATION LIMITATIONS: cleaning, laundry, shopping, and occupation   PERSONAL FACTORS: Age, Time since onset of injury/illness/exacerbation, and 1 comorbidity: lumber surgery  are also affecting patient's functional outcome.    REHAB POTENTIAL: Good   CLINICAL DECISION MAKING: Stable/uncomplicated   EVALUATION COMPLEXITY: Low     GOALS: Goals reviewed with patient? No   SHORT TERM GOALS: Target date: 12/03/2022   Pt will be independent with HEP in order to improve strength and balance in order to decrease fall risk and improve function at home and work. Baseline: Not able to do 02/05/23: Performing independently   Goal status: Achieved    2.  Patient will demonstrate knowledge of how to correctly don and doff left shoulder brace in order to be able to do it after surgery.   Baseline: Patient able to perform independently  Goal status: MET       LONG TERM GOALS: Target date: 03/30/2023   Patient will have improved function and activity level as evidenced by an increase in FOTO score by 10 points or more.  Baseline: 36/100 with target of 59 02/05/23: 49/100 with target 59 Goal status: Achieved    2.  Patient will show symmetrical AROM between left shoulder with right shoulder to regain ability to perform reaching tasks for her job.  Baseline: Shoulder Flexion R/L: 180/90*, Shoulder Abduction R/L 180/60, Shoulder combined IR R/L Thoracic spine, PSIS 02/05/23: See ROM measurements above  Goal status: Ongoing   3. Patient will show symmetrical strength between left shoulder with right shoulder to regain ability to perform reaching tasks for her job.  Baseline: Unable to flex or  abduct LUE past 90 degrees 02/05/23: Still limited with shoulder abduction and IR   Goal status: Ongoing     PLAN:   PT FREQUENCY: 1-2x/week   PT DURATION: 10 weeks   PLANNED INTERVENTIONS: Therapeutic exercises, Neuromuscular re-education, Self Care, Joint mobilization, Joint manipulation, Aquatic Therapy, Dry Needling, Electrical stimulation, Spinal manipulation, Spinal mobilization, Cryotherapy, Moist heat, Manual therapy, and Re-evaluation   PLAN FOR NEXT SESSION: FOTO. Measure ER PROM/AROM. Biceps soft tissue massage. Progress parascapular strengthening exercises : shoulder horizontal abduction (T's), Omega seated Rows, Lat Pull Downs, and serratus punches    Ellin Goodie PT, DPT   03/04/23, 9:43 AM

## 2023-03-05 ENCOUNTER — Ambulatory Visit: Payer: 59 | Admitting: Physical Therapy

## 2023-03-10 NOTE — Therapy (Signed)
OUTPATIENT PHYSICAL THERAPY TREATMENT NOTE   Patient Name: Sara Perry MRN: [id number] DOB:1960/10/18, 62 y.o., female Today's Date: 03/11/2023  PCP: Dr. Dale Cochranton  REFERRING PROVIDER: Dr. Ramond Marrow   END OF SESSION:   PT End of Session - 03/11/23 1813     Visit Number 17    Number of Visits 20    Date for PT Re-Evaluation 03/30/23    Authorization Type [facility] [employer] [id number]    Authorization Time Period 01/28/23-03/30/23    Authorization - Visit Number 17    Authorization - Number of Visits 20    Progress Note Due on Visit 10    PT Start Time 1815    PT Stop Time 1859    PT Time Calculation (min) 44 min    Activity Tolerance Patient tolerated treatment well    Behavior During Therapy WFL for tasks assessed/performed               Past Medical History:  Diagnosis Date   Arthritis    Complication of anesthesia    pt states she is very sensitive to most drugs, usually needs 1/2 of whatever other people get. She states as a child/teenager she had difficulty waking up after surgery due to the sensitivity that she has to drugs.   Eczema    Frequent UTI    H/O lymphadenopathy    With previous submandibular node removals.   Hypertension    Recurrent sinus infections    Sleep apnea    uses c-pap   Past Surgical History:  Procedure Laterality Date   COLONOSCOPY WITH PROPOFOL N/A 08/25/2017   Procedure: COLONOSCOPY WITH PROPOFOL;  Surgeon: Midge Minium, MD;  Location: [facility];  Service: Endoscopy;  Laterality: N/A;   COLONOSCOPY WITH PROPOFOL N/A 10/17/2022   Procedure: COLONOSCOPY WITH PROPOFOL;  Surgeon: Midge Minium, MD;  Location: [facility];  Service: Endoscopy;  Laterality: N/A;   LUMBAR FUSION  12/26/2019   l4-l5   Lymph Node Removal  1984   NECK SURGERY  1977   H/O Right neck surgery secondary to a benign growth with when patient was 63 or 62 yo   skin lesion extraction  2013   basal cell - Dr Cheree Ditto   TOTAL SHOULDER ARTHROPLASTY Left  12/18/2022   Procedure: TOTAL SHOULDER ARTHROPLASTY;  Surgeon: Bjorn Pippin, MD;  Location: [facility];  Service: Orthopedics;  Laterality: Left;   Uterine Polyps Removed  2004   Patient Active Problem List   Diagnosis Date Noted   Pre-op evaluation 11/16/2022   History of colonic polyps 10/17/2022   Abdominal bruit 09/27/2021   Osteoarthritis 05/30/2021   Ganglion cyst 01/20/2021   Urinary frequency 09/17/2020   Sleep apnea 06/20/2020   Thumb pain, right 06/20/2020   Lumbar stenosis with neurogenic claudication 12/26/2019   Hyperbilirubinemia 12/14/2019   Eye injury 05/26/2019   Left shoulder pain 05/23/2018   Snoring 05/23/2018   Benign neoplasm of descending colon    Benign neoplasm of ascending colon    Vaginal atrophy 03/22/2017   Meniere's disease 08/17/2016   Colon cancer screening 08/11/2015   Fatigue 06/03/2015   Health care maintenance 06/03/2015   Neck pain 02/05/2014   Hypercholesterolemia 02/05/2014   Back pain 12/05/2012   Trigger finger 12/05/2012   Essential hypertension, benign 12/05/2012    REFERRING DIAG: Left shoulder pain, left shoulder osteoarthritis   THERAPY DIAG:  Chronic left shoulder pain  Primary osteoarthritis of left shoulder  Rationale for Evaluation and Treatment  Rehabilitation  PERTINENT HISTORY: Pt reports that she is having left total shoulder surgery on February 22nd. It is a same day surgery. She did PT 4 years ago for her left shoulder and she has also tried PRP and gel injections, but these did not help with her shoulder pain. She has had prior lumbar spine surgery several years ago, and since then she has used arms more rather than rotate, which is why she thinks it caused overuse in left shoulder. She here clicking and popping in shoulder when she reaches above her head followed by pain. She states that she brought her post surgical shoulder brace in and she would like to know more about how to wear it correctly and what  she can and cannot do post surgery like whether she can use her left hand.   PRECAUTIONS: None   SUBJECTIVE:                                                                                                                                                                                      SUBJECTIVE STATEMENT: Patient reports discomfort when she is not moving arm but feels better with movement   PAIN:  Are you having pain? No   OBJECTIVE: (objective measures completed at initial evaluation unless otherwise dated)   DIAGNOSTIC FINDINGS:  CLINICAL DATA:  Chronic left shoulder pain.   EXAM: CT OF THE UPPER LEFT EXTREMITY WITHOUT CONTRAST   TECHNIQUE: Multidetector CT imaging of the upper left extremity was performed according to the standard protocol.   RADIATION DOSE REDUCTION: This exam was performed according to the departmental dose-optimization program which includes automated exposure control, adjustment of the mA and/or kV according to patient size and/or use of iterative reconstruction technique.   COMPARISON:  Left shoulder x-rays dated May 20, 2018.   FINDINGS: Bones/Joint/Cartilage   No fracture or dislocation.   Severe osteoarthritis of the glenohumeral joint with severe joint space narrowing, bone-on-bone appearance, subchondral sclerosis, subchondral cystic changes and marginal osteophytosis. Moderate joint effusion.   Normal acromioclavicular joint.   Ligaments   Ligaments are suboptimally evaluated by CT.   Muscles and Tendons Grossly intact.  No muscle atrophy.   Soft tissue No fluid collection or hematoma. No soft tissue mass. The visualized left lung is clear.   IMPRESSION: 1. Severe left glenohumeral osteoarthritis.     Electronically Signed   By: Obie Dredge M.D.   On: 11/04/2022 09:32   PATIENT SURVEYS:  FOTO 36/100 with target of 59    COGNITION: Overall cognitive status: Within functional limits for tasks assessed  SENSATION: WFL   POSTURE: No abnormalities noted    UPPER EXTREMITY ROM:    Active / PROM ROM Right eval Left eval 02/05/23 Right  02/05/23 Left   Shoulder flexion 180/180 90/180 180/180 160/150  Shoulder extension        Shoulder abduction 180/180 60*/60* 180/180 90*/160*  Shoulder adduction        Shoulder internal rotation (combined)  Thoracic Spine  PSIS     Shoulder external rotation     Occiput Occiput   Elbow flexion        Elbow extension        Wrist flexion        Wrist extension        Wrist ulnar deviation        Wrist radial deviation        Wrist pronation        Wrist supination        (Blank rows = not tested)   UPPER EXTREMITY MMT:   MMT Right eval Left eval Right 02/05/23 Left  02/05/23  Shoulder flexion 5 3+* 4+ 4  Shoulder extension        Shoulder abduction  5 3+  4 4-  Shoulder adduction 5 3+*    Shoulder internal rotation     4 4-  Shoulder external rotation     4 4-  Middle trapezius     4- 3+  Lower trapezius     4- 3-  Elbow flexion        Elbow extension        Wrist flexion        Wrist extension        Wrist ulnar deviation        Wrist radial deviation        Wrist pronation        Wrist supination        Grip strength (lbs)        (Blank rows = not tested)       JOINT MOBILITY TESTING:  Crepitus and firm end field at 90 degrees flexion and abduction    PALPATION:  TTP around entire left shoulder joint              TODAY'S TREATMENT:                             DATE:   03/11/23: UBE Seat at 5 with resistance at 3 for 5 min (2.5 fwd, 2.5 bwd) AROM ER: 63 degrees Manual: biceps soft tissue mobilization x 15 minutes  Supine shoulder stabilization circles 3 x 30 seconds, clockwise and counterclockwise  Seated shoulder horizontal abduction (T's) RTB 3 x 10  OMEGA seated row, 20#, 3x10  OMEGA Lat pull down, 15#, 2x10   03/03/23  THEREX  UBE Seat at 5 with resistance at 3 for 5 min  Shoulder  Scaption #1 3 x 10  Discussion about low back pain and need for ongoing stretches. -Seated forward flexion  -Butterfly forward flexion  Thomas Test: Negative Bilaterally    MANUAL   Soft tissue massage and trigger point release of left parascapular, RTC, and upper trap.    02/26/23  TherEx:  UBE seat  for 6 mins (2.5 min fwd and 2.5 min bwd) OMEGA seated row, 20#, 2x10  OMEGA Lat pull down, 15#, 2x10 with verbal cuing for proper technique   Manual:  STM with TP release technique  applied to extensor carpi ulnaris, extensor digitorum, and brachioradialis of the L arm as pt has been having difficulty with this pain due to job duties STM with TP release technique applied to infraspinatus, teres major/minor region for reduction in painful movements within the shoulder  Trigger Point Dry-Needling  Treatment instructions: Expect mild to moderate muscle soreness. S/S of pneumothorax if dry needled over a lung field, and to seek immediate medical attention should they occur. Patient verbalized understanding of these instructions and education.  Patient Consent Given: Yes Education handout provided: Previously provided Muscles treated: extensor carpi ulnaris, extensor digitorum, brachioradialis, infraspinatus, teres major/minor complex Electrical stimulation performed: No Parameters: N/A Treatment response/outcome: Pt had reduction in TP's following needling and was able to increase wrist extension ROM although sore following the treatment.     02/23/23:  UBE seat  for 6 mins (2.5 min fwd and 2.5 min bwd) Manual: STM parascapular musculature and UT  OMEGA seated row 2 x 10 15# OMEGA Lat pull down 3 x 10 15#  Rhythmic stabilization with ball circle clockwise/counterclockwise 2 x 30 seconds each direction    PATIENT EDUCATION: Education details: form and technique for appropriate exercise and explanation about post rehab protocol  Person educated: Patient Education method:  Explanation, Demonstration, Verbal cues, and Handouts Education comprehension: verbalized understanding, returned demonstration, and verbal cues required   HOME EXERCISE PROGRAM: Access Code: [email or web address] URL: [email or web address] Date: 03/03/2023 Prepared by: Ellin Goodie  Exercises - Seated Upper Trapezius Stretch  - 1 x daily - 3 reps - 30-60 sec hold - Seated Levator Scapulae Stretch  - 1 x daily - 3 reps - 30 sec  hold - Seated Single Arm Shoulder Row with Anchored Resistance  - 3 x weekly - 3 sets - 10 reps - Isometric Shoulder External Rotation  - 3 x weekly - 3 sets - 10 reps - 5 sec  hold - Doorway Rhomboid Stretch  - 1 x daily - 3 reps - 30 sec  hold - Standing Sleeper Stretch at [employer]  - 1 x daily - 3 reps - 30 sec  hold - Supine Shoulder Rhythmic Stabilization-circles clockwise and counter clockwise   - 3 x weekly - 3 sets - 10 reps - Seated Row Cable Machine  - 3 x weekly - 3 sets - 10 reps - Standing Shoulder Horizontal Abduction but do not use resistance   - 3 x weekly - 3 sets - 10 reps - Shoulder PNF D2 Flexion  - 3 x weekly - 3 sets - 10 reps - Standing Single Arm Shoulder PNF D1 Extension with Anchored Resistance  - 3 x weekly - 3 sets - 10 reps - Standing Shoulder External and Internal Rotation AROM  - 3 x weekly - 3 sets - 10 reps - Standing Shoulder Scaption  - 3 x weekly - 3 sets - 10 reps - Seated Shoulder External Rotation PROM on Table  - 1 x daily - 3 reps - 30 sec  hold - Standing Shoulder Abduction Finger Walk at Wall  - 1 x daily - 1 sets - 10 reps - 10 sec  hold - Scaption with Dumbbells  - 3 x weekly - 3 sets - 10 reps    ASSESSMENT:   CLINICAL IMPRESSION:  Patient continues to be limited in ER ROM on L shoulder. Able to tolerate increase in resistance this session. Session focused on parascapular strengthening and biceps soft tissue massage. Continues to be able to complete full day of work  with minimal pain. Reports not being able to  complete exercises during the day like she would prefer, but encouraged to choose 2-3 to complete during lunch hour. Patient will continue to benefit from skilled therapy to address remaining deficits in order to improve quality of life and return to PLOF.    OBJECTIVE IMPAIRMENTS: decreased ROM, decreased strength, impaired UE functional use, and pain.    ACTIVITY LIMITATIONS: carrying, lifting, bathing, dressing, and reach over head   PARTICIPATION LIMITATIONS: cleaning, laundry, shopping, and occupation   PERSONAL FACTORS: Age, Time since onset of injury/illness/exacerbation, and 1 comorbidity: lumber surgery  are also affecting patient's functional outcome.    REHAB POTENTIAL: Good   CLINICAL DECISION MAKING: Stable/uncomplicated   EVALUATION COMPLEXITY: Low     GOALS: Goals reviewed with patient? No   SHORT TERM GOALS: Target date: 12/03/2022   Pt will be independent with HEP in order to improve strength and balance in order to decrease fall risk and improve function at home and work. Baseline: Not able to do 02/05/23: Performing independently   Goal status: Achieved    2.  Patient will demonstrate knowledge of how to correctly don and doff left shoulder brace in order to be able to do it after surgery.   Baseline: Patient able to perform independently  Goal status: MET       LONG TERM GOALS: Target date: 03/30/2023   Patient will have improved function and activity level as evidenced by an increase in FOTO score by 10 points or more.  Baseline: 36/100 with target of 59 02/05/23: 49/100 with target 59; 5/29: 55/100 Goal status: Achieved    2.  Patient will show symmetrical AROM between left shoulder with right shoulder to regain ability to perform reaching tasks for her job.  Baseline: Shoulder Flexion R/L: 180/90*, Shoulder Abduction R/L 180/60, Shoulder combined IR R/L Thoracic spine, PSIS 02/05/23: See ROM measurements above  Goal status: Ongoing   3. Patient will show  symmetrical strength between left shoulder with right shoulder to regain ability to perform reaching tasks for her job.  Baseline: Unable to flex or abduct LUE past 90 degrees 02/05/23: Still limited with shoulder abduction and IR   Goal status: Ongoing     PLAN:   PT FREQUENCY: 1-2x/week   PT DURATION: 10 weeks   PLANNED INTERVENTIONS: Therapeutic exercises, Neuromuscular re-education, Self Care, Joint mobilization, Joint manipulation, Aquatic Therapy, Dry Needling, Electrical stimulation, Spinal manipulation, Spinal mobilization, Cryotherapy, Moist heat, Manual therapy, and Re-evaluation   PLAN FOR NEXT SESSION:Progress parascapular strengthening exercises : shoulder horizontal abduction (T's), Omega seated Rows, Lat Pull Downs, and serratus punches    Maylon Peppers, PT, DPT Physical Therapist - [place]  [facility]    03/11/23, 6:54 PM

## 2023-03-11 ENCOUNTER — Encounter: Payer: Self-pay | Admitting: Physical Therapy

## 2023-03-11 ENCOUNTER — Ambulatory Visit: Payer: 59

## 2023-03-11 DIAGNOSIS — G8929 Other chronic pain: Secondary | ICD-10-CM | POA: Diagnosis not present

## 2023-03-11 DIAGNOSIS — M19012 Primary osteoarthritis, left shoulder: Secondary | ICD-10-CM

## 2023-03-11 DIAGNOSIS — M25512 Pain in left shoulder: Secondary | ICD-10-CM | POA: Diagnosis not present

## 2023-03-12 ENCOUNTER — Encounter: Payer: Self-pay | Admitting: Internal Medicine

## 2023-03-12 NOTE — Telephone Encounter (Signed)
Yes.  Lipid panel, met b, liver panel and tsh.  Thanks.

## 2023-03-12 NOTE — Telephone Encounter (Signed)
Last fasting labs done 10/2022. Do you want to repeat labs at appt.

## 2023-03-13 ENCOUNTER — Encounter: Payer: Self-pay | Admitting: Internal Medicine

## 2023-03-13 ENCOUNTER — Ambulatory Visit (INDEPENDENT_AMBULATORY_CARE_PROVIDER_SITE_OTHER): Payer: 59 | Admitting: Internal Medicine

## 2023-03-13 VITALS — BP 120/70 | HR 79 | Temp 97.9°F | Resp 16 | Ht 64.0 in | Wt 120.4 lb

## 2023-03-13 DIAGNOSIS — Z1211 Encounter for screening for malignant neoplasm of colon: Secondary | ICD-10-CM | POA: Diagnosis not present

## 2023-03-13 DIAGNOSIS — E7800 Pure hypercholesterolemia, unspecified: Secondary | ICD-10-CM | POA: Diagnosis not present

## 2023-03-13 DIAGNOSIS — G4730 Sleep apnea, unspecified: Secondary | ICD-10-CM | POA: Diagnosis not present

## 2023-03-13 DIAGNOSIS — Z0000 Encounter for general adult medical examination without abnormal findings: Secondary | ICD-10-CM

## 2023-03-13 DIAGNOSIS — M25512 Pain in left shoulder: Secondary | ICD-10-CM

## 2023-03-13 DIAGNOSIS — Z124 Encounter for screening for malignant neoplasm of cervix: Secondary | ICD-10-CM

## 2023-03-13 DIAGNOSIS — I10 Essential (primary) hypertension: Secondary | ICD-10-CM | POA: Diagnosis not present

## 2023-03-13 LAB — LIPID PANEL
Cholesterol: 219 mg/dL — ABNORMAL HIGH (ref 0–200)
HDL: 61.5 mg/dL (ref >39.00)
LDL Cholesterol: 138 mg/dL — ABNORMAL HIGH (ref 0–99)
NonHDL: 157.96
Total CHOL/HDL Ratio: 4
Triglycerides: 100 mg/dL (ref 0.0–149.0)
VLDL: 20 mg/dL (ref 0.0–40.0)

## 2023-03-13 LAB — BASIC METABOLIC PANEL
BUN: 16 mg/dL (ref 6–23)
CO2: 31 mEq/L (ref 19–32)
Calcium: 9.6 mg/dL (ref 8.4–10.5)
Chloride: 100 mEq/L (ref 96–112)
Creatinine, Ser: 0.74 mg/dL (ref 0.40–1.20)
GFR: 87.23 mL/min (ref >60.00)
Glucose, Bld: 85 mg/dL (ref 70–99)
Potassium: 4.4 mEq/L (ref 3.5–5.1)
Sodium: 136 mEq/L (ref 135–145)

## 2023-03-13 LAB — HEPATIC FUNCTION PANEL
ALT: 16 U/L (ref 0–35)
AST: 20 U/L (ref 0–37)
Albumin: 4.5 g/dL (ref 3.5–5.2)
Alkaline Phosphatase: 49 U/L (ref 39–117)
Bilirubin, Direct: 0.2 mg/dL (ref 0.0–0.3)
Total Bilirubin: 1.2 mg/dL (ref 0.2–1.2)
Total Protein: 7 g/dL (ref 6.0–8.3)

## 2023-03-13 LAB — TSH: TSH: 1.36 u[IU]/mL (ref 0.35–5.50)

## 2023-03-13 NOTE — Assessment & Plan Note (Signed)
Physical today 03/13/23.  PAP 09/17/20 - negative with negative HPV.  Mammogram 12/09/21 - Birads I.  Scheduled for f/u 03/19/23. Colonoscopy 08/2017 - recommended f/u in 5 years.  Colonoscopy 10/2022 - non bleeding internal hemorrhoids.  Recommended f/u in 7 years.

## 2023-03-13 NOTE — Progress Notes (Unsigned)
Subjective:    [name], female    DOB: 08/03/1961, 62 y.o.   MRN: [id number]  Patient here for  Chief Complaint  Patient presents with   Annual Exam    HPI Here for her physical exam.  Is doing well.  Going to PT - left shoulder pain - s/p left total shoulder arthroplasty.  Saw pulmonary 01/30/23 - f/u sleep apnea.  Per note, compliant with cpap. (Pressure 5-13). Trying to stay active.  No chest pain or sob reported.  No cough or congestion.  No abdominal pain or bowel change reported.  Handling stress.    Past Medical History:  Diagnosis Date   Arthritis    Complication of anesthesia    pt states she is very sensitive to most drugs, usually needs 1/2 of whatever other people get. She states as a child/teenager she had difficulty waking up after surgery due to the sensitivity that she has to drugs.   Eczema    Frequent UTI    H/O lymphadenopathy    With previous submandibular node removals.   Hypertension    Recurrent sinus infections    Sleep apnea    uses c-pap   Past Surgical History:  Procedure Laterality Date   COLONOSCOPY WITH PROPOFOL N/A 08/25/2017   Procedure: COLONOSCOPY WITH PROPOFOL;  Surgeon: Midge Minium, MD;  Location: [facility];  Service: Endoscopy;  Laterality: N/A;   COLONOSCOPY WITH PROPOFOL N/A 10/17/2022   Procedure: COLONOSCOPY WITH PROPOFOL;  Surgeon: Midge Minium, MD;  Location: [facility];  Service: Endoscopy;  Laterality: N/A;   LUMBAR FUSION  12/26/2019   l4-l5   Lymph Node Removal  1984   NECK SURGERY  1977   H/O Right neck surgery secondary to a benign growth with when patient was 49 or 62 yo   skin lesion extraction  2013   basal cell - Dr Cheree Ditto   TOTAL SHOULDER ARTHROPLASTY Left 12/18/2022   Procedure: TOTAL SHOULDER ARTHROPLASTY;  Surgeon: Bjorn Pippin, MD;  Location: [facility];  Service: Orthopedics;  Laterality: Left;   Uterine Polyps Removed  2004   Family History  Problem Relation  Age of Onset   Hyperlipidemia Mother    Thyroid disease Mother    Heart disease Mother        Heart Valve problems   Coronary artery disease Father        Stents placed   Hypertension Other        uncle   Parkinson's disease Paternal Uncle    Parkinson's disease Maternal Grandmother    Heart disease Maternal Grandfather    Heart disease Paternal Grandfather        MI   Breast cancer Neg Hx    Social History   Socioeconomic History   Marital status: Divorced    Spouse name: Not on file   Number of children: 2   Years of education: Not on file   Highest education level: Not on file  Occupational History    Employer: [employer]  Tobacco Use   Smoking status: Never   Smokeless tobacco: Never  Vaping Use   Vaping Use: Never used  Substance and Sexual Activity   Alcohol use: Yes    Alcohol/week: 0.0 standard drinks of alcohol    Comment: Occasional   Drug use: No   Sexual activity: Not on file  Other Topics Concern   Not on file  Social History Narrative   Not on file  Social Determinants of Health   Financial Resource Strain: Not on file  Food Insecurity: Not on file  Transportation Needs: Not on file  Physical Activity: Not on file  Stress: Not on file  Social Connections: Not on file     Review of Systems     Objective:     BP 120/70   Pulse 79   Temp 97.9 F (36.6 C)   Resp 16   Ht 5\' 4"  (1.626 m)   Wt 120 lb 6.4 oz (54.6 kg)   LMP 12/01/2008   SpO2 99%   BMI 20.67 kg/m  Wt Readings from Last 3 Encounters:  03/13/23 120 lb 6.4 oz (54.6 kg)  01/30/23 120 lb 3.2 oz (54.5 kg)  12/18/22 115 lb 4.8 oz (52.3 kg)    Physical Exam Vitals reviewed.  Constitutional:      General: She is not in acute distress.    Appearance: Normal appearance. She is well-developed.  HENT:     Head: Normocephalic and atraumatic.     Right Ear: External ear normal.     Left Ear: External ear normal.  Eyes:     General: No scleral icterus.       Right eye: No  discharge.        Left eye: No discharge.     Conjunctiva/sclera: Conjunctivae normal.  Neck:     Thyroid: No thyromegaly.  Cardiovascular:     Rate and Rhythm: Normal rate and regular rhythm.  Pulmonary:     Effort: No tachypnea, accessory muscle usage or respiratory distress.     Breath sounds: Normal breath sounds. No decreased breath sounds or wheezing.  Chest:  Breasts:    Right: No inverted nipple, mass, nipple discharge or tenderness (no axillary adenopathy).     Left: No inverted nipple, mass, nipple discharge or tenderness (no axilarry adenopathy).  Abdominal:     General: Bowel sounds are normal.     Palpations: Abdomen is soft.     Tenderness: There is no abdominal tenderness.  Musculoskeletal:        General: No swelling or tenderness.     Cervical back: Neck supple.  Lymphadenopathy:     Cervical: No cervical adenopathy.  Skin:    Findings: No erythema or rash.  Neurological:     Mental Status: She is alert and oriented to person, place, and time.  Psychiatric:        Mood and Affect: Mood normal.        Behavior: Behavior normal.      Outpatient Encounter Medications as of 03/13/2023  Medication Sig   Acetaminophen Extra Strength 500 MG CAPS Take 2 tablets (1,000 mg) by mouth every 8 (eight) hours as needed.   aspirin 81 MG chewable tablet Chew 1 tablet (81 mg total) by mouth daily for 6 weeks for post-op DVT prophylaxis.   Blood Pressure Monitor MISC Check BP regularly.   Calcium Carb-Cholecalciferol (CALCIUM-VITAMIN D) 600-400 MG-UNIT TABS Take 1 tablet by mouth 3 (three) times a week.   celecoxib (CELEBREX) 100 MG capsule Take 1 capsule (100 mg total) by mouth 2 (two) times daily.   Cholecalciferol 25 MCG (1000 UT) capsule Take by mouth.   clobetasol (TEMOVATE) 0.05 % external solution Apply to affected scalp daily as needed for flares   clobetasol (TEMOVATE) 0.05 % external solution Apply 1 Application topically to scalp daily as needed for flares.    estradiol (ESTRACE VAGINAL) 0.1 MG/GM vaginal cream Use 1 application at night for  5 nights and then use 2 times per week   fluocinonide (LIDEX) 0.05 % external solution APPLY TO AFFECTED AREA(S) AT BEDTIME (Patient taking differently: Apply 1 application  topically See admin instructions. Use once or twice a week)   Glucosamine-Chondroit-Vit C-Mn (GLUCOSAMINE-CHONDROITIN) CAPS Take 1 capsule by mouth daily.   Multiple Vitamin (MULTIVITAMIN) tablet Take 1 tablet by mouth daily. Centrum Silver 50+   Omega-3 Fatty Acids (FISH OIL) 1000 MG CAPS Take 1 capsule by mouth daily.   telmisartan (MICARDIS) 40 MG tablet Take 1 tablet (40 mg total) by mouth daily.   tretinoin (RETIN-A) 0.05 % cream Apply 1 application topically every 30 (thirty) days. Apply to affected area as needed for Acne   [DISCONTINUED] ondansetron (ZOFRAN) 4 MG tablet Take 1 tablet (4 mg total) by mouth every 6 (six) hours as needed for nausea / vomiting. (Patient not taking: Reported on 01/30/2023)   [DISCONTINUED] oxyCODONE (OXY IR/ROXICODONE) 5 MG immediate release tablet Take 1-2 tablets (5-10 mg total) by mouth every 6 (six) hours as needed no more than 6 pills a day. (Patient not taking: Reported on 01/30/2023)   [DISCONTINUED] TURMERIC PO Take 1 capsule by mouth daily. (Patient not taking: Reported on 01/30/2023)   No facility-administered encounter medications on file as of 03/13/2023.     Lab Results  Component Value Date   WBC 4.7 09/30/2022   HGB 13.0 09/30/2022   HCT 38.4 09/30/2022   PLT 345.0 09/30/2022   GLUCOSE 82 09/30/2022   CHOL 213 (H) 09/30/2022   TRIG 99.0 09/30/2022   HDL 71.40 09/30/2022   LDLDIRECT 148.9 08/15/2013   LDLCALC 122 (H) 09/30/2022   ALT 15 12/30/2022   AST 14 12/30/2022   NA 142 10/16/2022   K 4.7 10/16/2022   CL 105 10/16/2022   CREATININE 0.6 10/16/2022   BUN 15 10/16/2022   CO2 30 (A) 10/16/2022   TSH 2.08 04/03/2022    DG Shoulder Left Port  Result Date: 12/20/2022 CLINICAL DATA:   Total shoulder arthroplasty EXAM: LEFT SHOULDER COMPARISON:  None Available. FINDINGS: Patient is status post total shoulder arthroplasty. Hardware is in good position on the single frontal view. IMPRESSION: Left shoulder arthroplasty as above. Electronically Signed   By: Gerome Sam III M.D.   On: 12/20/2022 11:51       Assessment & Plan:  Health care maintenance Assessment & Plan: Physical today 03/13/23.  PAP 09/17/20 - negative with negative HPV.  Mammogram 12/09/21 - Birads I.  Scheduled for f/u 03/19/23. Colonoscopy 08/2017 - recommended f/u in 5 years.  Colonoscopy 10/2022 - non bleeding internal hemorrhoids.  Recommended f/u in 7 years.    Hypercholesterolemia -     Lipid panel -     Basic metabolic panel -     Hepatic function panel -     TSH  Screening for cervical cancer     Dale Concord, MD

## 2023-03-14 ENCOUNTER — Encounter: Payer: Self-pay | Admitting: Internal Medicine

## 2023-03-14 NOTE — Assessment & Plan Note (Signed)
Saw pulmonary 01/30/23 - f/u sleep apnea.  Per note, compliant with cpap. (Pressure 5-13).

## 2023-03-14 NOTE — Assessment & Plan Note (Signed)
Going to PT - left shoulder pain - s/p left total shoulder arthroplasty.  Doing well.

## 2023-03-14 NOTE — Assessment & Plan Note (Signed)
The 10-year ASCVD risk score (Arnett DK, et al., 2019) is: 4.3%   Values used to calculate the score:     Age: 62 years     Sex: Female     Is Non-Hispanic African American: No     Diabetic: No     Tobacco smoker: No     Systolic Blood Pressure: 120 mmHg     Is BP treated: Yes     HDL Cholesterol: 61.5 mg/dL     Total Cholesterol: 219 mg/dL  Low cholesterol diet and exercise.  Follow lipid panel.

## 2023-03-14 NOTE — Assessment & Plan Note (Signed)
Colonoscopy 10/2022 - non bleeding internal hemorrhoids.  Recommended f/u in 7 years.

## 2023-03-14 NOTE — Assessment & Plan Note (Signed)
On micardis 40mg  q day.  Blood pressures as outlined.  Follow pressures.  Follow metabolic panel.

## 2023-03-16 ENCOUNTER — Ambulatory Visit: Payer: 59 | Admitting: Physical Therapy

## 2023-03-16 MED FILL — Telmisartan Tab 40 MG: ORAL | 30 days supply | Qty: 30 | Fill #2 | Status: AC

## 2023-03-18 ENCOUNTER — Ambulatory Visit: Payer: 59 | Attending: Orthopaedic Surgery | Admitting: Physical Therapy

## 2023-03-18 DIAGNOSIS — M25512 Pain in left shoulder: Secondary | ICD-10-CM | POA: Diagnosis not present

## 2023-03-18 DIAGNOSIS — M19012 Primary osteoarthritis, left shoulder: Secondary | ICD-10-CM | POA: Insufficient documentation

## 2023-03-18 DIAGNOSIS — G8929 Other chronic pain: Secondary | ICD-10-CM | POA: Diagnosis not present

## 2023-03-18 NOTE — Therapy (Signed)
OUTPATIENT PHYSICAL THERAPY TREATMENT NOTE   Patient Name: Sara Perry MRN: [id number] DOB:11/28/1960, 62 y.o., female Today's Date: 03/18/2023  PCP: Dr. Dale Vernon  REFERRING PROVIDER: Dr. Ramond Marrow   END OF SESSION:   PT End of Session - 03/18/23 1733     Visit Number 18    Number of Visits 20    Date for PT Re-Evaluation 03/30/23    Authorization Type [facility] [employer] [id number]    Authorization Time Period 01/28/23-03/30/23    Authorization - Visit Number 18    Authorization - Number of Visits 20    Progress Note Due on Visit 20    PT Start Time 1730    PT Stop Time 1815    PT Time Calculation (min) 45 min    Activity Tolerance Patient tolerated treatment well    Behavior During Therapy WFL for tasks assessed/performed                Past Medical History:  Diagnosis Date   Arthritis    Complication of anesthesia    pt states she is very sensitive to most drugs, usually needs 1/2 of whatever other people get. She states as a child/teenager she had difficulty waking up after surgery due to the sensitivity that she has to drugs.   Eczema    Frequent UTI    H/O lymphadenopathy    With previous submandibular node removals.   Hypertension    Recurrent sinus infections    Sleep apnea    uses c-pap   Past Surgical History:  Procedure Laterality Date   COLONOSCOPY WITH PROPOFOL N/A 08/25/2017   Procedure: COLONOSCOPY WITH PROPOFOL;  Surgeon: Midge Minium, MD;  Location: [facility];  Service: Endoscopy;  Laterality: N/A;   COLONOSCOPY WITH PROPOFOL N/A 10/17/2022   Procedure: COLONOSCOPY WITH PROPOFOL;  Surgeon: Midge Minium, MD;  Location: [facility];  Service: Endoscopy;  Laterality: N/A;   LUMBAR FUSION  12/26/2019   l4-l5   Lymph Node Removal  1984   NECK SURGERY  1977   H/O Right neck surgery secondary to a benign growth with when patient was 51 or 62 yo   skin lesion extraction  2013   basal cell - Dr Cheree Ditto   TOTAL SHOULDER ARTHROPLASTY Left  12/18/2022   Procedure: TOTAL SHOULDER ARTHROPLASTY;  Surgeon: Bjorn Pippin, MD;  Location: [facility];  Service: Orthopedics;  Laterality: Left;   Uterine Polyps Removed  2004   Patient Active Problem List   Diagnosis Date Noted   Pre-op evaluation 11/16/2022   History of colonic polyps 10/17/2022   Abdominal bruit 09/27/2021   Osteoarthritis 05/30/2021   Ganglion cyst 01/20/2021   Urinary frequency 09/17/2020   Sleep apnea 06/20/2020   Thumb pain, right 06/20/2020   Lumbar stenosis with neurogenic claudication 12/26/2019   Hyperbilirubinemia 12/14/2019   Eye injury 05/26/2019   Left shoulder pain 05/23/2018   Snoring 05/23/2018   Benign neoplasm of descending colon    Benign neoplasm of ascending colon    Vaginal atrophy 03/22/2017   Meniere's disease 08/17/2016   Colon cancer screening 08/11/2015   Fatigue 06/03/2015   Health care maintenance 06/03/2015   Neck pain 02/05/2014   Hypercholesterolemia 02/05/2014   Back pain 12/05/2012   Trigger finger 12/05/2012   Essential hypertension, benign 12/05/2012    REFERRING DIAG: Left shoulder pain, left shoulder osteoarthritis   THERAPY DIAG:  Chronic left shoulder pain  Primary osteoarthritis of left shoulder  Rationale for Evaluation and  Treatment Rehabilitation  PERTINENT HISTORY: Pt reports that she is having left total shoulder surgery on February 22nd. It is a same day surgery. She did PT 4 years ago for her left shoulder and she has also tried PRP and gel injections, but these did not help with her shoulder pain. She has had prior lumbar spine surgery several years ago, and since then she has used arms more rather than rotate, which is why she thinks it caused overuse in left shoulder. She here clicking and popping in shoulder when she reaches above her head followed by pain. She states that she brought her post surgical shoulder brace in and she would like to know more about how to wear it correctly and what  she can and cannot do post surgery like whether she can use her left hand.   PRECAUTIONS: None   SUBJECTIVE:                                                                                                                                                                                      SUBJECTIVE STATEMENT: Pt reports that she is improving but she continues to have some deficits and pain on the anterior shoulder especially after using shoulder throughout the day at her job.   PAIN:  Are you having pain? Yes: NPRS scale: 2-3/10 Pain location: Anterior of left shoulder  Pain description: Achy  Aggravating factors: Lifting  Relieving factors: Not lifting    OBJECTIVE: (objective measures completed at initial evaluation unless otherwise dated)   DIAGNOSTIC FINDINGS:  CLINICAL DATA:  Chronic left shoulder pain.   EXAM: CT OF THE UPPER LEFT EXTREMITY WITHOUT CONTRAST   TECHNIQUE: Multidetector CT imaging of the upper left extremity was performed according to the standard protocol.   RADIATION DOSE REDUCTION: This exam was performed according to the departmental dose-optimization program which includes automated exposure control, adjustment of the mA and/or kV according to patient size and/or use of iterative reconstruction technique.   COMPARISON:  Left shoulder x-rays dated May 20, 2018.   FINDINGS: Bones/Joint/Cartilage   No fracture or dislocation.   Severe osteoarthritis of the glenohumeral joint with severe joint space narrowing, bone-on-bone appearance, subchondral sclerosis, subchondral cystic changes and marginal osteophytosis. Moderate joint effusion.   Normal acromioclavicular joint.   Ligaments   Ligaments are suboptimally evaluated by CT.   Muscles and Tendons Grossly intact.  No muscle atrophy.   Soft tissue No fluid collection or hematoma. No soft tissue mass. The visualized left lung is clear.   IMPRESSION: 1. Severe left glenohumeral  osteoarthritis.     Electronically Signed   By: Obie Dredge M.D.   On:  11/04/2022 09:32   PATIENT SURVEYS:  FOTO 36/100 with target of 59    COGNITION: Overall cognitive status: Within functional limits for tasks assessed                                  SENSATION: WFL   POSTURE: No abnormalities noted    UPPER EXTREMITY ROM:    Active / PROM ROM Right eval Left eval 02/05/23 Right  02/05/23 Left   Shoulder flexion 180/180 90/180 180/180 160/150  Shoulder extension        Shoulder abduction 180/180 60*/60* 180/180 90*/160*  Shoulder adduction        Shoulder internal rotation (combined)  Thoracic Spine  PSIS     Shoulder external rotation     Occiput Occiput   Elbow flexion        Elbow extension        Wrist flexion        Wrist extension        Wrist ulnar deviation        Wrist radial deviation        Wrist pronation        Wrist supination        (Blank rows = not tested)   UPPER EXTREMITY MMT:   MMT Right eval Left eval Right 02/05/23 Left  02/05/23  Shoulder flexion 5 3+* 4+ 4  Shoulder extension        Shoulder abduction  5 3+  4 4-  Shoulder adduction 5 3+*    Shoulder internal rotation     4 4-  Shoulder external rotation     4 4-  Middle trapezius     4- 3+  Lower trapezius     4- 3-  Elbow flexion        Elbow extension        Wrist flexion        Wrist extension        Wrist ulnar deviation        Wrist radial deviation        Wrist pronation        Wrist supination        Grip strength (lbs)        (Blank rows = not tested)       JOINT MOBILITY TESTING:  Crepitus and firm end field at 90 degrees flexion and abduction    PALPATION:  TTP around entire left shoulder joint              TODAY'S TREATMENT:                             DATE:   03/18/23: UBE Seat at 5 with resistance at 3 for 5 min (2.5 fwd, 2.5 bwd) Lower Trap Set with OMEGA Lat machine #15 1 x 10  Lower Trap Lift Offs with Vs on wall 1 x 10  Shoulder T's  Horizontal Abduction at 90 deg with red band 1 x 10   -Pt unable to complete full ROM  Shoulder T's Horizontal Abduction at 90 deg with red band 2 x 10  -Pt able to perform with less tension in band  Seated Shoulder External Rotation PROM Stretch 2 x 30 sec  -Increased lumbar rotation with compensation  Standing Overhead ER Stretch with strap  5 x 30 sec   03/11/23: UBE  Seat at 5 with resistance at 3 for 5 min (2.5 fwd, 2.5 bwd) AROM ER: 63 degrees Manual: biceps soft tissue mobilization x 15 minutes  Supine shoulder stabilization circles 3 x 30 seconds, clockwise and counterclockwise  Seated shoulder horizontal abduction (T's) RTB 3 x 10  OMEGA seated row, 20#, 3x10  OMEGA Lat pull down, 15#, 2x10   03/03/23  THEREX  UBE Seat at 5 with resistance at 3 for 5 min  Shoulder Scaption #1 3 x 10  Discussion about low back pain and need for ongoing stretches. -Seated forward flexion  -Butterfly forward flexion  Thomas Test: Negative Bilaterally    MANUAL   Soft tissue massage and trigger point release of left parascapular, RTC, and upper trap.    02/26/23  TherEx:  UBE seat  for 6 mins (2.5 min fwd and 2.5 min bwd) OMEGA seated row, 20#, 2x10  OMEGA Lat pull down, 15#, 2x10 with verbal cuing for proper technique   Manual:  STM with TP release technique applied to extensor carpi ulnaris, extensor digitorum, and brachioradialis of the L arm as pt has been having difficulty with this pain due to job duties STM with TP release technique applied to infraspinatus, teres major/minor region for reduction in painful movements within the shoulder  Trigger Point Dry-Needling  Treatment instructions: Expect mild to moderate muscle soreness. S/S of pneumothorax if dry needled over a lung field, and to seek immediate medical attention should they occur. Patient verbalized understanding of these instructions and education.  Patient Consent Given: Yes Education handout provided: Previously  provided Muscles treated: extensor carpi ulnaris, extensor digitorum, brachioradialis, infraspinatus, teres major/minor complex Electrical stimulation performed: No Parameters: N/A Treatment response/outcome: Pt had reduction in TP's following needling and was able to increase wrist extension ROM although sore following the treatment.     PATIENT EDUCATION: Education details: form and technique for appropriate exercise and explanation about post rehab protocol  Person educated: Patient Education method: Explanation, Demonstration, Verbal cues, and Handouts Education comprehension: verbalized understanding, returned demonstration, and verbal cues required   HOME EXERCISE PROGRAM: Access Code: [email or web address] URL: [email or web address] Date: 03/18/2023 Prepared by: Ellin Goodie  Exercises - Seated Levator Scapulae Stretch  - 1 x daily - 3 reps - 30 sec  hold - Standing Shoulder Horizontal Abduction but do not use resistance   - 3 x weekly - 3 sets - 10 reps - Scaption with Dumbbells  - 2-3 x weekly - 3 sets - 10 reps - Standing Overhead Shoulder External Rotation Stretch with Towel  - 1 x daily - 3 reps - 30 sec hold - Standing Low Trap Setting with Resistance at Wall  - 3-4 x weekly - 3 sets - 10 reps    ASSESSMENT:   CLINICAL IMPRESSION:   Pt continues to show decreased shoulder abduction AROM and shoulder external rotation. Shoulder ER limited by ongoing parascapular weakness. Modified HEP to include lower trap exercise that pt is not limited in performing because of altered scapulohumeral rhythm. She was able to achieve lower trap activation without limitations in ROM or increase in pain. She will continue to benefit from skilled therapy to address remaining deficits in order to improve quality of life and return to PLOF.   OBJECTIVE IMPAIRMENTS: decreased ROM, decreased strength, impaired UE functional use, and pain.    ACTIVITY LIMITATIONS: carrying, lifting, bathing,  dressing, and reach over head   PARTICIPATION LIMITATIONS: cleaning, laundry, shopping, and occupation   PERSONAL FACTORS: Age, Time since onset of  injury/illness/exacerbation, and 1 comorbidity: lumber surgery  are also affecting patient's functional outcome.    REHAB POTENTIAL: Good   CLINICAL DECISION MAKING: Stable/uncomplicated   EVALUATION COMPLEXITY: Low     GOALS: Goals reviewed with patient? No   SHORT TERM GOALS: Target date: 12/03/2022   Pt will be independent with HEP in order to improve strength and balance in order to decrease fall risk and improve function at home and work. Baseline: Not able to do 02/05/23: Performing independently   Goal status: Achieved    2.  Patient will demonstrate knowledge of how to correctly don and doff left shoulder brace in order to be able to do it after surgery.   Baseline: Patient able to perform independently  Goal status: MET       LONG TERM GOALS: Target date: 03/30/2023   Patient will have improved function and activity level as evidenced by an increase in FOTO score by 10 points or more.  Baseline: 36/100 with target of 59 02/05/23: 49/100 with target 59; 5/29: 55/100 Goal status: Achieved    2.  Patient will show symmetrical AROM between left shoulder with right shoulder to regain ability to perform reaching tasks for her job.  Baseline: Shoulder Flexion R/L: 180/90*, Shoulder Abduction R/L 180/60, Shoulder combined IR R/L Thoracic spine, PSIS 02/05/23: See ROM measurements above  Goal status: Ongoing   3. Patient will show symmetrical strength between left shoulder with right shoulder to regain ability to perform reaching tasks for her job.  Baseline: Unable to flex or abduct LUE past 90 degrees 02/05/23: Still limited with shoulder abduction and IR   Goal status: Ongoing     PLAN:   PT FREQUENCY: 1-2x/week   PT DURATION: 10 weeks   PLANNED INTERVENTIONS: Therapeutic exercises, Neuromuscular re-education, Self Care,  Joint mobilization, Joint manipulation, Aquatic Therapy, Dry Needling, Electrical stimulation, Spinal manipulation, Spinal mobilization, Cryotherapy, Moist heat, Manual therapy, and Re-evaluation   PLAN FOR NEXT SESSION: Do re-certification. D2 PNF flexion with bent elbow, serratus wall slides with pillowcase, front raises and lateral raises. Single arm rows    Ellin Goodie PT, DPT  Physical Therapist - [facility] 03/18/23, 8:43 PM

## 2023-03-19 ENCOUNTER — Other Ambulatory Visit: Payer: Self-pay

## 2023-03-19 ENCOUNTER — Ambulatory Visit
Admission: RE | Admit: 2023-03-19 | Discharge: 2023-03-19 | Disposition: A | Discharge status: Home / Self Care | Payer: 59 | Source: Ambulatory Visit | Attending: Internal Medicine | Admitting: Internal Medicine

## 2023-03-19 DIAGNOSIS — M19012 Primary osteoarthritis, left shoulder: Secondary | ICD-10-CM | POA: Diagnosis not present

## 2023-03-19 DIAGNOSIS — Z1231 Encounter for screening mammogram for malignant neoplasm of breast: Secondary | ICD-10-CM | POA: Insufficient documentation

## 2023-03-19 MED ORDER — CELECOXIB 100 MG PO CAPS
100.0000 mg | ORAL_CAPSULE | Freq: Two times a day (BID) | ORAL | 2 refills | Status: DC
  Filled 2023-03-19: qty 60, 30d supply, fill #0
  Filled 2023-05-11: qty 60, 30d supply, fill #1

## 2023-03-20 ENCOUNTER — Other Ambulatory Visit: Payer: Self-pay | Admitting: Internal Medicine

## 2023-03-20 DIAGNOSIS — R928 Other abnormal and inconclusive findings on diagnostic imaging of breast: Secondary | ICD-10-CM

## 2023-03-20 DIAGNOSIS — N63 Unspecified lump in unspecified breast: Secondary | ICD-10-CM

## 2023-03-23 ENCOUNTER — Ambulatory Visit: Payer: 59 | Admitting: Physical Therapy

## 2023-03-23 DIAGNOSIS — G8929 Other chronic pain: Secondary | ICD-10-CM

## 2023-03-23 DIAGNOSIS — M19012 Primary osteoarthritis, left shoulder: Secondary | ICD-10-CM | POA: Diagnosis not present

## 2023-03-23 DIAGNOSIS — M25512 Pain in left shoulder: Secondary | ICD-10-CM | POA: Diagnosis not present

## 2023-03-23 NOTE — Therapy (Unsigned)
OUTPATIENT PHYSICAL THERAPY TREATMENT NOTE   Patient Name: Sara Perry MRN: [id number] DOB:06/03/1961, 62 y.o., female Today's Date: 03/23/2023  PCP: Dr. Dale [place]  REFERRING PROVIDER: Dr. Ramond Marrow   END OF SESSION:   PT End of Session - 03/23/23 1734     Visit Number 19    Number of Visits 20    Date for PT Re-Evaluation 03/30/23    Authorization Type [facility] [employer] [id number]    Authorization Time Period 01/28/23-03/30/23    Authorization - Visit Number 19    Authorization - Number of Visits 20    Progress Note Due on Visit 20    PT Start Time 1730    PT Stop Time 1815    PT Time Calculation (min) 45 min    Activity Tolerance Patient tolerated treatment well    Behavior During Therapy WFL for tasks assessed/performed                Past Medical History:  Diagnosis Date   Arthritis    Complication of anesthesia    pt states she is very sensitive to most drugs, usually needs 1/2 of whatever other people get. She states as a child/teenager she had difficulty waking up after surgery due to the sensitivity that she has to drugs.   Eczema    Frequent UTI    H/O lymphadenopathy    With previous submandibular node removals.   Hypertension    Recurrent sinus infections    Sleep apnea    uses c-pap   Past Surgical History:  Procedure Laterality Date   COLONOSCOPY WITH PROPOFOL N/A 08/25/2017   Procedure: COLONOSCOPY WITH PROPOFOL;  Surgeon: Midge Minium, MD;  Location: [facility];  Service: Endoscopy;  Laterality: N/A;   COLONOSCOPY WITH PROPOFOL N/A 10/17/2022   Procedure: COLONOSCOPY WITH PROPOFOL;  Surgeon: Midge Minium, MD;  Location: [facility];  Service: Endoscopy;  Laterality: N/A;   LUMBAR FUSION  12/26/2019   l4-l5   Lymph Node Removal  1984   NECK SURGERY  1977   H/O Right neck surgery secondary to a benign growth with when patient was 68 or 62 yo   skin lesion extraction  2013   basal cell - Dr Cheree Ditto   TOTAL SHOULDER ARTHROPLASTY Left  12/18/2022   Procedure: TOTAL SHOULDER ARTHROPLASTY;  Surgeon: Bjorn Pippin, MD;  Location: [facility];  Service: Orthopedics;  Laterality: Left;   Uterine Polyps Removed  2004   Patient Active Problem List   Diagnosis Date Noted   Pre-op evaluation 11/16/2022   History of colonic polyps 10/17/2022   Abdominal bruit 09/27/2021   Osteoarthritis 05/30/2021   Ganglion cyst 01/20/2021   Urinary frequency 09/17/2020   Sleep apnea 06/20/2020   Thumb pain, right 06/20/2020   Lumbar stenosis with neurogenic claudication 12/26/2019   Hyperbilirubinemia 12/14/2019   Eye injury 05/26/2019   Left shoulder pain 05/23/2018   Snoring 05/23/2018   Benign neoplasm of descending colon    Benign neoplasm of ascending colon    Vaginal atrophy 03/22/2017   Meniere's disease 08/17/2016   Colon cancer screening 08/11/2015   Fatigue 06/03/2015   Health care maintenance 06/03/2015   Neck pain 02/05/2014   Hypercholesterolemia 02/05/2014   Back pain 12/05/2012   Trigger finger 12/05/2012   Essential hypertension, benign 12/05/2012    REFERRING DIAG: Left shoulder pain, left shoulder osteoarthritis   THERAPY DIAG:  Chronic left shoulder pain  Primary osteoarthritis of left shoulder  Rationale for Evaluation and  Treatment Rehabilitation  PERTINENT HISTORY: Pt reports that she is having left total shoulder surgery on February 22nd. It is a same day surgery. She did PT 4 years ago for her left shoulder and she has also tried PRP and gel injections, but these did not help with her shoulder pain. She has had prior lumbar spine surgery several years ago, and since then she has used arms more rather than rotate, which is why she thinks it caused overuse in left shoulder. She here clicking and popping in shoulder when she reaches above her head followed by pain. She states that she brought her post surgical shoulder brace in and she would like to know more about how to wear it correctly and what  she can and cannot do post surgery like whether she can use her left hand.   PRECAUTIONS: None   SUBJECTIVE:                                                                                                                                                                                      SUBJECTIVE STATEMENT: Pt reports that her arm continues to feel stiff in morning and at night. She did have a massage that helped restore her shoulder motion.   PAIN:  Are you having pain? Yes: NPRS scale: 2-3/10 Pain location: Anterior of left shoulder  Pain description: Achy  Aggravating factors: Lifting  Relieving factors: Not lifting    OBJECTIVE: (objective measures completed at initial evaluation unless otherwise dated)   DIAGNOSTIC FINDINGS:  CLINICAL DATA:  Chronic left shoulder pain.   EXAM: CT OF THE UPPER LEFT EXTREMITY WITHOUT CONTRAST   TECHNIQUE: Multidetector CT imaging of the upper left extremity was performed according to the standard protocol.   RADIATION DOSE REDUCTION: This exam was performed according to the departmental dose-optimization program which includes automated exposure control, adjustment of the mA and/or kV according to patient size and/or use of iterative reconstruction technique.   COMPARISON:  Left shoulder x-rays dated May 20, 2018.   FINDINGS: Bones/Joint/Cartilage   No fracture or dislocation.   Severe osteoarthritis of the glenohumeral joint with severe joint space narrowing, bone-on-bone appearance, subchondral sclerosis, subchondral cystic changes and marginal osteophytosis. Moderate joint effusion.   Normal acromioclavicular joint.   Ligaments   Ligaments are suboptimally evaluated by CT.   Muscles and Tendons Grossly intact.  No muscle atrophy.   Soft tissue No fluid collection or hematoma. No soft tissue mass. The visualized left lung is clear.   IMPRESSION: 1. Severe left glenohumeral osteoarthritis.     Electronically  Signed   By: Obie Dredge M.D.   On: 11/04/2022 09:32  PATIENT SURVEYS:  FOTO 36/100 with target of 39    COGNITION: Overall cognitive status: Within functional limits for tasks assessed                                  SENSATION: WFL   POSTURE: No abnormalities noted    UPPER EXTREMITY ROM:    Active / PROM ROM Right eval Left eval 02/05/23 Right  02/05/23 Left   Shoulder flexion 180/180 90/180 180/180 160/150  Shoulder extension        Shoulder abduction 180/180 60*/60* 180/180 90*/160*  Shoulder adduction        Shoulder internal rotation (combined)  Thoracic Spine  PSIS     Shoulder external rotation     Occiput Occiput   Elbow flexion        Elbow extension        Wrist flexion        Wrist extension        Wrist ulnar deviation        Wrist radial deviation        Wrist pronation        Wrist supination        (Blank rows = not tested)   UPPER EXTREMITY MMT:   MMT Right eval Left eval Right 02/05/23 Left  02/05/23  Shoulder flexion 5 3+* 4+ 4  Shoulder extension        Shoulder abduction  5 3+  4 4-  Shoulder adduction 5 3+*    Shoulder internal rotation     4 4-  Shoulder external rotation     4 4-  Middle trapezius     4- 3+  Lower trapezius     4- 3-  Elbow flexion        Elbow extension        Wrist flexion        Wrist extension        Wrist ulnar deviation        Wrist radial deviation        Wrist pronation        Wrist supination        Grip strength (lbs)        (Blank rows = not tested)       JOINT MOBILITY TESTING:  Crepitus and firm end field at 90 degrees flexion and abduction    PALPATION:  TTP around entire left shoulder joint              TODAY'S TREATMENT:                             DATE:   03/23/23: All exercises performed on RUE  UBE Seat at 5 with resistance at 3 for 5 min (2.5 fwd, 2.5 bwd) Side Lying Shoulder ER at 0 deg abduction with #1 DB 1 x 10 Side Lying Shoulder ER at 0 deg abduction with #2 DB 1 x 10   Standing Shoulder IR at 0 deg abduction with green band 2 x 10  -min VC to decrease rotation of torso  D2 PNF Flexion/Extension 3 x 5    Chest and Biceps Stretch 3 x 60 sec  Long sitting biceps stretch  Forward lean bicep stretch  Shoulder Internal Rotation Stretch with towel 2 x 30 sec    03/18/23: UBE Seat at 5 with resistance at  3 for 5 min (2.5 fwd, 2.5 bwd) Lower Trap Set with OMEGA Lat machine #15 1 x 10  Lower Trap Lift Offs with Vs on wall 1 x 10  Shoulder T's Horizontal Abduction at 90 deg with red band 1 x 10   -Pt unable to complete full ROM  Shoulder T's Horizontal Abduction at 90 deg with red band 2 x 10  -Pt able to perform with less tension in band  Seated Shoulder External Rotation PROM Stretch 2 x 30 sec  -Increased lumbar rotation with compensation  Standing Overhead ER Stretch with strap  5 x 30 sec   03/11/23: UBE Seat at 5 with resistance at 3 for 5 min (2.5 fwd, 2.5 bwd) AROM ER: 63 degrees Manual: biceps soft tissue mobilization x 15 minutes  Supine shoulder stabilization circles 3 x 30 seconds, clockwise and counterclockwise  Seated shoulder horizontal abduction (T's) RTB 3 x 10  OMEGA seated row, 20#, 3x10  OMEGA Lat pull down, 15#, 2x10   03/03/23  THEREX  UBE Seat at 5 with resistance at 3 for 5 min  Shoulder Scaption #1 3 x 10  Discussion about low back pain and need for ongoing stretches. -Seated forward flexion  -Butterfly forward flexion  Thomas Test: Negative Bilaterally    MANUAL   Soft tissue massage and trigger point release of left parascapular, RTC, and upper trap.    02/26/23  TherEx:  UBE seat  for 6 mins (2.5 min fwd and 2.5 min bwd) OMEGA seated row, 20#, 2x10  OMEGA Lat pull down, 15#, 2x10 with verbal cuing for proper technique   Manual:  STM with TP release technique applied to extensor carpi ulnaris, extensor digitorum, and brachioradialis of the L arm as pt has been having difficulty with this pain due to job  duties STM with TP release technique applied to infraspinatus, teres major/minor region for reduction in painful movements within the shoulder  Trigger Point Dry-Needling  Treatment instructions: Expect mild to moderate muscle soreness. S/S of pneumothorax if dry needled over a lung field, and to seek immediate medical attention should they occur. Patient verbalized understanding of these instructions and education.  Patient Consent Given: Yes Education handout provided: Previously provided Muscles treated: extensor carpi ulnaris, extensor digitorum, brachioradialis, infraspinatus, teres major/minor complex Electrical stimulation performed: No Parameters: N/A Treatment response/outcome: Pt had reduction in TP's following needling and was able to increase wrist extension ROM although sore following the treatment.     PATIENT EDUCATION: Education details: form and technique for appropriate exercise and explanation about post rehab protocol  Person educated: Patient Education method: Explanation, Demonstration, Verbal cues, and Handouts Education comprehension: verbalized understanding, returned demonstration, and verbal cues required   HOME EXERCISE PROGRAM: Access Code: [email or web address] URL: [email or web address] Date: 03/23/2023 Prepared by: Ellin Goodie  Exercises - Chest and Bicep Stretch - Arms Behind Back  - 1 x daily - 3 reps - 30-60 sec  hold - Shoulder PNF D2 Flexion  - 3-4 x weekly - 3 sets - 5 reps - Shoulder Internal Rotation with Resistance  - 3-4 x weekly - 3 sets - 10 reps - Standing Overhead Shoulder External Rotation Stretch with Towel  - 1 x daily - 3 reps - 30 sec hold - Sidelying Shoulder External Rotation with Dumbbell  - 3-4 x weekly - 3 sets - 10 reps - Standing Low Trap Setting with Resistance at Wall  - 3-4 x weekly - 3 sets - 10 reps - Standing Shoulder Internal Rotation  Stretch with Towel  - 1 x daily - 3 reps - 60 sec  hold ASSESSMENT:   CLINICAL  IMPRESSION:   Pt continues to show decreased shoulder abduction AROM and shoulder external rotation. Shoulder ER limited by ongoing parascapular weakness. Modified HEP to include lower trap exercise that pt is not limited in performing because of altered scapulohumeral rhythm. She was able to achieve lower trap activation without limitations in ROM or increase in pain. She will continue to benefit from skilled therapy to address remaining deficits in order to improve quality of life and return to PLOF.   OBJECTIVE IMPAIRMENTS: decreased ROM, decreased strength, impaired UE functional use, and pain.    ACTIVITY LIMITATIONS: carrying, lifting, bathing, dressing, and reach over head   PARTICIPATION LIMITATIONS: cleaning, laundry, shopping, and occupation   PERSONAL FACTORS: Age, Time since onset of injury/illness/exacerbation, and 1 comorbidity: lumber surgery  are also affecting patient's functional outcome.    REHAB POTENTIAL: Good   CLINICAL DECISION MAKING: Stable/uncomplicated   EVALUATION COMPLEXITY: Low     GOALS: Goals reviewed with patient? No   SHORT TERM GOALS: Target date: 12/03/2022   Pt will be independent with HEP in order to improve strength and balance in order to decrease fall risk and improve function at home and work. Baseline: Not able to do 02/05/23: Performing independently   Goal status: Achieved    2.  Patient will demonstrate knowledge of how to correctly don and doff left shoulder brace in order to be able to do it after surgery.   Baseline: Patient able to perform independently  Goal status: MET       LONG TERM GOALS: Target date: 03/30/2023   Patient will have improved function and activity level as evidenced by an increase in FOTO score by 10 points or more.  Baseline: 36/100 with target of 59 02/05/23: 49/100 with target 59; 5/29: 55/100 Goal status: Achieved    2.  Patient will show symmetrical AROM between left shoulder with right shoulder to regain  ability to perform reaching tasks for her job.  Baseline: Shoulder Flexion R/L: 180/90*, Shoulder Abduction R/L 180/60, Shoulder combined IR R/L Thoracic spine, PSIS 02/05/23: See ROM measurements above  Goal status: Ongoing   3. Patient will show symmetrical strength between left shoulder with right shoulder to regain ability to perform reaching tasks for her job.  Baseline: Unable to flex or abduct LUE past 90 degrees 02/05/23: Still limited with shoulder abduction and IR   Goal status: Ongoing     PLAN:   PT FREQUENCY: 1-2x/week   PT DURATION: 10 weeks   PLANNED INTERVENTIONS: Therapeutic exercises, Neuromuscular re-education, Self Care, Joint mobilization, Joint manipulation, Aquatic Therapy, Dry Needling, Electrical stimulation, Spinal manipulation, Spinal mobilization, Cryotherapy, Moist heat, Manual therapy, and Re-evaluation   PLAN FOR NEXT SESSION: Slides with pillowcase, front raises and lateral raises. Single arm rows . Quadruped position and check in on internal rotation   Ellin Goodie PT, DPT  Physical Therapist - [facility] 03/23/23, 5:35 PM

## 2023-03-25 ENCOUNTER — Ambulatory Visit: Payer: 59 | Admitting: Physical Therapy

## 2023-03-25 ENCOUNTER — Encounter: Payer: Self-pay | Admitting: Physical Therapy

## 2023-03-25 ENCOUNTER — Encounter: Payer: 59 | Admitting: Internal Medicine

## 2023-03-25 DIAGNOSIS — G8929 Other chronic pain: Secondary | ICD-10-CM | POA: Diagnosis not present

## 2023-03-25 DIAGNOSIS — M19012 Primary osteoarthritis, left shoulder: Secondary | ICD-10-CM | POA: Diagnosis not present

## 2023-03-25 DIAGNOSIS — M25512 Pain in left shoulder: Secondary | ICD-10-CM | POA: Diagnosis not present

## 2023-03-25 NOTE — Therapy (Signed)
OUTPATIENT PHYSICAL THERAPY PROGRESS NOTE/ Re-certification   Dates of Reporting: 01/28/23-03/30/23  Patient Name: Sara Perry MRN: [id number] DOB:10-10-1961, 62 y.o., female Today's Date: 03/26/2023  PCP: Dr. Dale [place]  REFERRING PROVIDER: Dr. Ramond Marrow   END OF SESSION:   PT End of Session - 03/25/23 1729     Visit Number 20    Number of Visits 25    Date for PT Re-Evaluation 03/30/23    Authorization Type [facility] [employer] [id number]    Authorization Time Period 01/28/23-03/30/23    Authorization - Visit Number 20    Authorization - Number of Visits 25    Progress Note Due on Visit 25    PT Start Time 1730    PT Stop Time 1815    PT Time Calculation (min) 45 min    Activity Tolerance Patient tolerated treatment well    Behavior During Therapy WFL for tasks assessed/performed                Past Medical History:  Diagnosis Date   Arthritis    Complication of anesthesia    pt states she is very sensitive to most drugs, usually needs 1/2 of whatever other people get. She states as a child/teenager she had difficulty waking up after surgery due to the sensitivity that she has to drugs.   Eczema    Frequent UTI    H/O lymphadenopathy    With previous submandibular node removals.   Hypertension    Recurrent sinus infections    Sleep apnea    uses c-pap   Past Surgical History:  Procedure Laterality Date   COLONOSCOPY WITH PROPOFOL N/A 08/25/2017   Procedure: COLONOSCOPY WITH PROPOFOL;  Surgeon: Midge Minium, MD;  Location: [facility] END[facility];  Service: Endoscopy;  Laterality: N/A;   COLONOSCOPY WITH PROPOFOL N/A 10/17/2022   Procedure: COLONOSCOPY WITH PROPOFOL;  Surgeon: Midge Minium, MD;  Location: [facility];  Service: Endoscopy;  Laterality: N/A;   LUMBAR FUSION  12/26/2019   l4-l5   Lymph Node Removal  1984   NECK SURGERY  1977   H/O Right neck surgery secondary to a benign growth with when patient was 79 or 62 yo   skin lesion extraction  2013    basal cell - Dr Cheree Ditto   TOTAL SHOULDER ARTHROPLASTY Left 12/18/2022   Procedure: TOTAL SHOULDER ARTHROPLASTY;  Surgeon: Bjorn Pippin, MD;  Location: [facility];  Service: Orthopedics;  Laterality: Left;   Uterine Polyps Removed  2004   Patient Active Problem List   Diagnosis Date Noted   Pre-op evaluation 11/16/2022   History of colonic polyps 10/17/2022   Abdominal bruit 09/27/2021   Osteoarthritis 05/30/2021   Ganglion cyst 01/20/2021   Urinary frequency 09/17/2020   Sleep apnea 06/20/2020   Thumb pain, right 06/20/2020   Lumbar stenosis with neurogenic claudication 12/26/2019   Hyperbilirubinemia 12/14/2019   Eye injury 05/26/2019   Left shoulder pain 05/23/2018   Snoring 05/23/2018   Benign neoplasm of descending colon    Benign neoplasm of ascending colon    Vaginal atrophy 03/22/2017   Meniere's disease 08/17/2016   Colon cancer screening 08/11/2015   Fatigue 06/03/2015   Health care maintenance 06/03/2015   Neck pain 02/05/2014   Hypercholesterolemia 02/05/2014   Back pain 12/05/2012   Trigger finger 12/05/2012   Essential hypertension, benign 12/05/2012    REFERRING DIAG: Left shoulder pain, left shoulder osteoarthritis   THERAPY DIAG:  Chronic left shoulder pain  Primary osteoarthritis of left  shoulder  Rationale for Evaluation and Treatment Rehabilitation  PERTINENT HISTORY: Pt reports that she is having left total shoulder surgery on February 22nd. It is a same day surgery. She did PT 4 years ago for her left shoulder and she has also tried PRP and gel injections, but these did not help with her shoulder pain. She has had prior lumbar spine surgery several years ago, and since then she has used arms more rather than rotate, which is why she thinks it caused overuse in left shoulder. She here clicking and popping in shoulder when she reaches above her head followed by pain. She states that she brought her post surgical shoulder brace in and she  would like to know more about how to wear it correctly and what she can and cannot do post surgery like whether she can use her left hand.   PRECAUTIONS: None   SUBJECTIVE:                                                                                                                                                                                      SUBJECTIVE STATEMENT: Pt reports feeling increased discomfort while performing the external rotation stretches. She still continues to feel increased stiffness in her right shoulder and her shoulder blade muscles.   PAIN:  Are you having pain? Yes: NPRS scale: 2-3/10 Pain location: Anterior of left shoulder  Pain description: Achy  Aggravating factors: Lifting  Relieving factors: Not lifting    OBJECTIVE: (objective measures completed at initial evaluation unless otherwise dated)   DIAGNOSTIC FINDINGS:  CLINICAL DATA:  Chronic left shoulder pain.   EXAM: CT OF THE UPPER LEFT EXTREMITY WITHOUT CONTRAST   TECHNIQUE: Multidetector CT imaging of the upper left extremity was performed according to the standard protocol.   RADIATION DOSE REDUCTION: This exam was performed according to the departmental dose-optimization program which includes automated exposure control, adjustment of the mA and/or kV according to patient size and/or use of iterative reconstruction technique.   COMPARISON:  Left shoulder x-rays dated May 20, 2018.   FINDINGS: Bones/Joint/Cartilage   No fracture or dislocation.   Severe osteoarthritis of the glenohumeral joint with severe joint space narrowing, bone-on-bone appearance, subchondral sclerosis, subchondral cystic changes and marginal osteophytosis. Moderate joint effusion.   Normal acromioclavicular joint.   Ligaments   Ligaments are suboptimally evaluated by CT.   Muscles and Tendons Grossly intact.  No muscle atrophy.   Soft tissue No fluid collection or hematoma. No soft tissue mass. The  visualized left lung is clear.   IMPRESSION: 1. Severe left glenohumeral osteoarthritis.     Electronically Signed   By: Obie Dredge  M.D.   On: 11/04/2022 09:32   PATIENT SURVEYS:  FOTO 36/100 with target of 59    COGNITION: Overall cognitive status: Within functional limits for tasks assessed                                  SENSATION: WFL   POSTURE: No abnormalities noted    UPPER EXTREMITY ROM:    Active / PROM ROM Right eval Left eval 02/05/23 Right  02/05/23 Left  03/25/23 Right  03/25/23 Left   Shoulder flexion 180/180 90/180 180/180 160/150 180 170  Shoulder extension          Shoulder abduction 180/180 60*/60* 180/180 90*/160* 180 160  Shoulder adduction          Shoulder internal rotation (combined)  Thoracic Spine  PSIS   Mid Thoracic Spine  T12/L1    Shoulder external rotation     Occiput Occiput  C8 C8  Elbow flexion          Elbow extension          Wrist flexion          Wrist extension          Wrist ulnar deviation          Wrist radial deviation          Wrist pronation          Wrist supination          (Blank rows = not tested)   UPPER EXTREMITY MMT:   MMT Right eval Left eval Right 02/05/23 Left  02/05/23  Shoulder flexion 5 3+* 4+ 4  Shoulder extension        Shoulder abduction  5 3+  4 4-  Shoulder adduction 5 3+*    Shoulder internal rotation     4 4-  Shoulder external rotation     4 4-  Middle trapezius     4- 3+  Lower trapezius     4- 3-  Elbow flexion        Elbow extension        Wrist flexion        Wrist extension        Wrist ulnar deviation        Wrist radial deviation        Wrist pronation        Wrist supination        Grip strength (lbs)        (Blank rows = not tested)       JOINT MOBILITY TESTING:  Crepitus and firm end field at 90 degrees flexion and abduction    PALPATION:  TTP around entire left shoulder joint              TODAY'S TREATMENT:                             DATE:   03/25/23: All  exercises performed on RUE  UBE Seat at 5 with resistance at 3 for 5 min (2.5 fwd, 2.5 bwd) Shoulder AROM: -Shoulder Flex R/L 180/170 -Shoulder Abd R/L 180/160  -Combined ER R/L C8 vertebrae (symmetrical) -Combined IR R/L Mid Thoracic, T12/L5  Shoulder AAROM IR 3 x 10  -Pt reports burning along insertion of biceps  D1 PNF Flexion using yellow band 1 x 5 -Pt struggles to perform  D1  PNF Flexion using #1 DB 1 x 10  FOTO: 55/100    03/23/23: All exercises performed on RUE  UBE Seat at 5 with resistance at 3 for 5 min (2.5 fwd, 2.5 bwd) Side Lying Shoulder ER at 0 deg abduction with #1 DB 1 x 10 Side Lying Shoulder ER at 0 deg abduction with #2 DB 1 x 10  Standing Shoulder IR at 0 deg abduction with green band 2 x 10  -min VC to decrease rotation of torso  D2 PNF Flexion/Extension 3 x 5    Chest and Biceps Stretch 3 x 60 sec  Long sitting anterior shoulder and biceps stretch 2 x 30 sec  Biceps Stretch at table 2 x 30 sec  Shoulder Internal Rotation Stretch with towel 2 x 30 sec    03/18/23: UBE Seat at 5 with resistance at 3 for 5 min (2.5 fwd, 2.5 bwd) Lower Trap Set with OMEGA Lat machine #15 1 x 10  Lower Trap Lift Offs with Vs on wall 1 x 10  Shoulder T's Horizontal Abduction at 90 deg with red band 1 x 10   -Pt unable to complete full ROM  Shoulder T's Horizontal Abduction at 90 deg with red band 2 x 10  -Pt able to perform with less tension in band  Seated Shoulder External Rotation PROM Stretch 2 x 30 sec  -Increased lumbar rotation with compensation  Standing Overhead ER Stretch with strap  5 x 30 sec   03/11/23: UBE Seat at 5 with resistance at 3 for 5 min (2.5 fwd, 2.5 bwd) AROM ER: 63 degrees Manual: biceps soft tissue mobilization x 15 minutes  Supine shoulder stabilization circles 3 x 30 seconds, clockwise and counterclockwise  Seated shoulder horizontal abduction (T's) RTB 3 x 10  OMEGA seated row, 20#, 3x10  OMEGA Lat pull down, 15#, 2x10   03/03/23  THEREX   UBE Seat at 5 with resistance at 3 for 5 min  Shoulder Scaption #1 3 x 10  Discussion about low back pain and need for ongoing stretches. -Seated forward flexion  -Butterfly forward flexion  Thomas Test: Negative Bilaterally    MANUAL   Soft tissue massage and trigger point release of left parascapular, RTC, and upper trap.      PATIENT EDUCATION: Education details: form and technique for appropriate exercise and explanation about post rehab protocol  Person educated: Patient Education method: Explanation, Demonstration, Verbal cues, and Handouts Education comprehension: verbalized understanding, returned demonstration, and verbal cues required   HOME EXERCISE PROGRAM: Access Code: [email or web address] URL: [email or web address] Date: 03/23/2023 Prepared by: Ellin Goodie  Exercises - Chest and Bicep Stretch - Arms Behind Back  - 1 x daily - 3 reps - 30-60 sec  hold - Shoulder PNF D2 Flexion  - 3-4 x weekly - 3 sets - 5 reps - Shoulder Internal Rotation with Resistance  - 3-4 x weekly - 3 sets - 10 reps - Standing Overhead Shoulder External Rotation Stretch with Towel  - 1 x daily - 3 reps - 30 sec hold - Sidelying Shoulder External Rotation with Dumbbell  - 3-4 x weekly - 3 sets - 10 reps - Standing Low Trap Setting with Resistance at Wall  - 3-4 x weekly - 3 sets - 10 reps - Standing Shoulder Internal Rotation Stretch with Towel  - 1 x daily - 3 reps - 60 sec  hold ASSESSMENT:   CLINICAL IMPRESSION:  Pt is now s/p 14 weeks right total shoulder arthroplasty. She continues  to progress towards her goals with improved shoulder AROM and function. She continues to have difficulty with performing some functional tasks at her job such as reaching out with left shoulder and adducting weight object closer to her body and she has decreased left shoulder internal rotation and abduction. She will continue to benefit from skilled therapy to address remaining deficits in order to improve  quality of life and return to PLOF.     OBJECTIVE IMPAIRMENTS: decreased ROM, decreased strength, impaired UE functional use, and pain.    ACTIVITY LIMITATIONS: carrying, lifting, bathing, dressing, and reach over head   PARTICIPATION LIMITATIONS: cleaning, laundry, shopping, and occupation   PERSONAL FACTORS: Age, Time since onset of injury/illness/exacerbation, and 1 comorbidity: lumber surgery  are also affecting patient's functional outcome.    REHAB POTENTIAL: Good   CLINICAL DECISION MAKING: Stable/uncomplicated   EVALUATION COMPLEXITY: Low     GOALS: Goals reviewed with patient? No   SHORT TERM GOALS: Target date: 12/03/2022   Pt will be independent with HEP in order to improve strength and balance in order to decrease fall risk and improve function at home and work. Baseline: Not able to do 02/05/23: Performing independently   Goal status: Achieved    2.  Patient will demonstrate knowledge of how to correctly don and doff left shoulder brace in order to be able to do it after surgery.   Baseline: Patient able to perform independently  Goal status: MET       LONG TERM GOALS: Target date: 03/30/2023   Patient will have improved function and activity level as evidenced by an increase in FOTO score by 10 points or more.  Baseline: 36/100 with target of 59 02/05/23: 49/100 with target 59; 5/29: 55/100 03/25/23: 55/100 Goal status: Achieved    2.  Patient will show symmetrical AROM between left shoulder with right shoulder to regain ability to perform reaching tasks for her job.  Baseline: Shoulder Flexion R/L: 180/90*, Shoulder Abduction R/L 180/60, Shoulder combined IR R/L Thoracic spine, PSIS 02/05/23: See ROM measurements above 03/25/23: Shoulder Flexion R/L 180/170 Shoulder Abduction R/L 180/160 Shoulder Combined ER R/L C8 vertebrae Shoulder  Goal status: Ongoing   3. Patient will show symmetrical strength between left shoulder with right shoulder to regain ability to  perform reaching tasks for her job.  Baseline: Unable to flex or abduct LUE past 90 degrees 02/05/23: Still limited with shoulder abduction and IR  03/25/23: Still limited in LUE extension and internal rotation tasks  Goal status: Ongoing     PLAN:   PT FREQUENCY: 1-2x/week   PT DURATION: 10 weeks   PLANNED INTERVENTIONS: Therapeutic exercises, Neuromuscular re-education, Self Care, Joint mobilization, Joint manipulation, Aquatic Therapy, Dry Needling, Electrical stimulation, Spinal manipulation, Spinal mobilization, Cryotherapy, Moist heat, Manual therapy, and Re-evaluation   PLAN FOR NEXT SESSION: Continue to focus on shoulder internal AROM and AAROM. Wall walks with shoulder abduction. Slides with pillowcase, front raises and lateral raises. Single arm rows . Quadruped position and check in on internal rotation   Ellin Goodie PT, DPT  Physical Therapist - [facility] 03/26/23, 10:04 AM

## 2023-03-26 ENCOUNTER — Ambulatory Visit
Admission: RE | Admit: 2023-03-26 | Discharge: 2023-03-26 | Disposition: A | Discharge status: Home / Self Care | Payer: 59 | Source: Ambulatory Visit | Attending: Internal Medicine | Admitting: Internal Medicine

## 2023-03-26 DIAGNOSIS — N6001 Solitary cyst of right breast: Secondary | ICD-10-CM | POA: Diagnosis not present

## 2023-03-26 DIAGNOSIS — N63 Unspecified lump in unspecified breast: Secondary | ICD-10-CM

## 2023-03-26 DIAGNOSIS — R92331 Mammographic heterogeneous density, right breast: Secondary | ICD-10-CM | POA: Diagnosis not present

## 2023-03-26 DIAGNOSIS — R928 Other abnormal and inconclusive findings on diagnostic imaging of breast: Secondary | ICD-10-CM

## 2023-03-30 ENCOUNTER — Ambulatory Visit: Payer: 59 | Admitting: Physical Therapy

## 2023-03-30 DIAGNOSIS — G8929 Other chronic pain: Secondary | ICD-10-CM

## 2023-03-30 DIAGNOSIS — M19012 Primary osteoarthritis, left shoulder: Secondary | ICD-10-CM | POA: Diagnosis not present

## 2023-03-30 DIAGNOSIS — M25512 Pain in left shoulder: Secondary | ICD-10-CM | POA: Diagnosis not present

## 2023-03-30 NOTE — Therapy (Signed)
OUTPATIENT PHYSICAL THERAPY TREATMENT   Patient Name: Sara Perry MRN: [id number] DOB:04-26-1961, 62 y.o., female Today's Date: 03/30/2023  PCP: Dr. Dale [place]  REFERRING PROVIDER: Dr. Ramond Marrow   END OF SESSION:   PT End of Session - 03/30/23 1735     Visit Number 21    Number of Visits 25    Date for PT Re-Evaluation 03/30/23    Authorization Type [facility] [employer] [id number]    Authorization Time Period 03/31/23-05/31/23    Authorization - Visit Number 21    Authorization - Number of Visits 25    Progress Note Due on Visit 25    PT Start Time 1735    PT Stop Time 1815    PT Time Calculation (min) 40 min    Activity Tolerance Patient tolerated treatment well    Behavior During Therapy WFL for tasks assessed/performed                Past Medical History:  Diagnosis Date   Arthritis    Complication of anesthesia    pt states she is very sensitive to most drugs, usually needs 1/2 of whatever other people get. She states as a child/teenager she had difficulty waking up after surgery due to the sensitivity that she has to drugs.   Eczema    Frequent UTI    H/O lymphadenopathy    With previous submandibular node removals.   Hypertension    Recurrent sinus infections    Sleep apnea    uses c-pap   Past Surgical History:  Procedure Laterality Date   COLONOSCOPY WITH PROPOFOL N/A 08/25/2017   Procedure: COLONOSCOPY WITH PROPOFOL;  Surgeon: Midge Minium, MD;  Location: [facility] END[facility];  Service: Endoscopy;  Laterality: N/A;   COLONOSCOPY WITH PROPOFOL N/A 10/17/2022   Procedure: COLONOSCOPY WITH PROPOFOL;  Surgeon: Midge Minium, MD;  Location: [facility];  Service: Endoscopy;  Laterality: N/A;   LUMBAR FUSION  12/26/2019   l4-l5   Lymph Node Removal  1984   NECK SURGERY  1977   H/O Right neck surgery secondary to a benign growth with when patient was 77 or 62 yo   skin lesion extraction  2013   basal cell - Dr Cheree Ditto   TOTAL SHOULDER ARTHROPLASTY Left  12/18/2022   Procedure: TOTAL SHOULDER ARTHROPLASTY;  Surgeon: Bjorn Pippin, MD;  Location: [facility];  Service: Orthopedics;  Laterality: Left;   Uterine Polyps Removed  2004   Patient Active Problem List   Diagnosis Date Noted   Pre-op evaluation 11/16/2022   History of colonic polyps 10/17/2022   Abdominal bruit 09/27/2021   Osteoarthritis 05/30/2021   Ganglion cyst 01/20/2021   Urinary frequency 09/17/2020   Sleep apnea 06/20/2020   Thumb pain, right 06/20/2020   Lumbar stenosis with neurogenic claudication 12/26/2019   Hyperbilirubinemia 12/14/2019   Eye injury 05/26/2019   Left shoulder pain 05/23/2018   Snoring 05/23/2018   Benign neoplasm of descending colon    Benign neoplasm of ascending colon    Vaginal atrophy 03/22/2017   Meniere's disease 08/17/2016   Colon cancer screening 08/11/2015   Fatigue 06/03/2015   Health care maintenance 06/03/2015   Neck pain 02/05/2014   Hypercholesterolemia 02/05/2014   Back pain 12/05/2012   Trigger finger 12/05/2012   Essential hypertension, benign 12/05/2012    REFERRING DIAG: Left shoulder pain, left shoulder osteoarthritis   THERAPY DIAG:  Chronic left shoulder pain  Primary osteoarthritis of left shoulder  Rationale for Evaluation and Treatment  Rehabilitation  PERTINENT HISTORY: Pt reports that she is having left total shoulder surgery on February 22nd. It is a same day surgery. She did PT 4 years ago for her left shoulder and she has also tried PRP and gel injections, but these did not help with her shoulder pain. She has had prior lumbar spine surgery several years ago, and since then she has used arms more rather than rotate, which is why she thinks it caused overuse in left shoulder. She here clicking and popping in shoulder when she reaches above her head followed by pain. She states that she brought her post surgical shoulder brace in and she would like to know more about how to wear it correctly and what  she can and cannot do post surgery like whether she can use her left hand.   PRECAUTIONS: None   SUBJECTIVE:                                                                                                                                                                                      SUBJECTIVE STATEMENT: Pt reports that her muscle soreness that she was experiencing in her left shoulder has decreased since last session. She continues to have some weakness with certain movements.   PAIN:  Are you having pain? Yes: NPRS scale: 2-3/10 Pain location: Anterior of left shoulder  Pain description: Achy  Aggravating factors: Lifting  Relieving factors: Not lifting    OBJECTIVE: (objective measures completed at initial evaluation unless otherwise dated)   DIAGNOSTIC FINDINGS:  CLINICAL DATA:  Chronic left shoulder pain.   EXAM: CT OF THE UPPER LEFT EXTREMITY WITHOUT CONTRAST   TECHNIQUE: Multidetector CT imaging of the upper left extremity was performed according to the standard protocol.   RADIATION DOSE REDUCTION: This exam was performed according to the departmental dose-optimization program which includes automated exposure control, adjustment of the mA and/or kV according to patient size and/or use of iterative reconstruction technique.   COMPARISON:  Left shoulder x-rays dated May 20, 2018.   FINDINGS: Bones/Joint/Cartilage   No fracture or dislocation.   Severe osteoarthritis of the glenohumeral joint with severe joint space narrowing, bone-on-bone appearance, subchondral sclerosis, subchondral cystic changes and marginal osteophytosis. Moderate joint effusion.   Normal acromioclavicular joint.   Ligaments   Ligaments are suboptimally evaluated by CT.   Muscles and Tendons Grossly intact.  No muscle atrophy.   Soft tissue No fluid collection or hematoma. No soft tissue mass. The visualized left lung is clear.   IMPRESSION: 1. Severe left glenohumeral  osteoarthritis.     Electronically Signed   By: Obie Dredge M.D.   On: 11/04/2022 09:32  PATIENT SURVEYS:  FOTO 36/100 with target of 72    COGNITION: Overall cognitive status: Within functional limits for tasks assessed                                  SENSATION: WFL   POSTURE: No abnormalities noted    UPPER EXTREMITY ROM:    Active / PROM ROM Right eval Left eval 02/05/23 Right  02/05/23 Left  03/25/23 Right  03/25/23 Left   Shoulder flexion 180/180 90/180 180/180 160/150 180 170  Shoulder extension          Shoulder abduction 180/180 60*/60* 180/180 90*/160* 180 160  Shoulder adduction          Shoulder internal rotation (combined)  Thoracic Spine  PSIS   Mid Thoracic Spine  T12/L1    Shoulder external rotation     Occiput Occiput  C8 C8  Elbow flexion          Elbow extension          Wrist flexion          Wrist extension          Wrist ulnar deviation          Wrist radial deviation          Wrist pronation          Wrist supination          (Blank rows = not tested)   UPPER EXTREMITY MMT:   MMT Right eval Left eval Right 02/05/23 Left  02/05/23  Shoulder flexion 5 3+* 4+ 4  Shoulder extension        Shoulder abduction  5 3+  4 4-  Shoulder adduction 5 3+*    Shoulder internal rotation     4 4-  Shoulder external rotation     4 4-  Middle trapezius     4- 3+  Lower trapezius     4- 3-  Elbow flexion        Elbow extension        Wrist flexion        Wrist extension        Wrist ulnar deviation        Wrist radial deviation        Wrist pronation        Wrist supination        Grip strength (lbs)        (Blank rows = not tested)       JOINT MOBILITY TESTING:  Crepitus and firm end field at 90 degrees flexion and abduction    PALPATION:  TTP around entire left shoulder joint              TODAY'S TREATMENT:                             DATE:   03/30/23: All exercises performed on LUE UBE Seat at 5 with resistance at 3 for 5 min (2.5  fwd, 2.5 bwd) OMEGA Single Arm Row #10 3 x 10  -Pt reports crepitus in shoulder  Shoulder ER at 0 deg abduction with red band 1 x 10  Shoulder IR at 0 deg abduction with green band 1 x 10  -min VC to maintain elbow contact with side wall of body  OMEGA Single Arm Lat Pull Down with overhand grip #5  1 x 3  -Pt unable to maintain an extended elbow  OMEGA Single Arm Lat Pull Down with under hand grip #5 1 x 3  -Pt unable to maintain an extended elbow  Seated Lat Pull Down #20 2 x 10  -Pt reports increased pain in her lumbar spine  Standing Lat Pull Down #15 1 x 10  -Pt compensated with increased upper trap activation  Standing Lat Pull Down #10 1 x 10 -Straight elbow and no upper trap activation   03/25/23: All exercises performed on LUE  UBE Seat at 5 with resistance at 3 for 5 min (2.5 fwd, 2.5 bwd) Shoulder AROM: -Shoulder Flex R/L 180/170 -Shoulder Abd R/L 180/160  -Combined ER R/L C8 vertebrae (symmetrical) -Combined IR R/L Mid Thoracic, T12/L5  Shoulder AAROM IR 3 x 10  -Pt reports burning along insertion of biceps  D1 PNF Flexion using yellow band 1 x 5 -Pt struggles to perform  D1 PNF Flexion using #1 DB 1 x 10  FOTO: 55/100    03/23/23: All exercises performed on LUE  UBE Seat at 5 with resistance at 3 for 5 min (2.5 fwd, 2.5 bwd) Side Lying Shoulder ER at 0 deg abduction with #1 DB 1 x 10 Side Lying Shoulder ER at 0 deg abduction with #2 DB 1 x 10  Standing Shoulder IR at 0 deg abduction with green band 2 x 10  -min VC to decrease rotation of torso  D2 PNF Flexion/Extension 3 x 5    Chest and Biceps Stretch 3 x 60 sec  Long sitting anterior shoulder and biceps stretch 2 x 30 sec  Biceps Stretch at table 2 x 30 sec  Shoulder Internal Rotation Stretch with towel 2 x 30 sec    03/18/23: UBE Seat at 5 with resistance at 3 for 5 min (2.5 fwd, 2.5 bwd) Lower Trap Set with OMEGA Lat machine #15 1 x 10  Lower Trap Lift Offs with Vs on wall 1 x 10  Shoulder T's Horizontal  Abduction at 90 deg with red band 1 x 10   -Pt unable to complete full ROM  Shoulder T's Horizontal Abduction at 90 deg with red band 2 x 10  -Pt able to perform with less tension in band  Seated Shoulder External Rotation PROM Stretch 2 x 30 sec  -Increased lumbar rotation with compensation  Standing Overhead ER Stretch with strap  5 x 30 sec   03/11/23: UBE Seat at 5 with resistance at 3 for 5 min (2.5 fwd, 2.5 bwd) AROM ER: 63 degrees Manual: biceps soft tissue mobilization x 15 minutes  Supine shoulder stabilization circles 3 x 30 seconds, clockwise and counterclockwise  Seated shoulder horizontal abduction (T's) RTB 3 x 10  OMEGA seated row, 20#, 3x10  OMEGA Lat pull down, 15#, 2x10      PATIENT EDUCATION: Education details: form and technique for appropriate exercise and explanation about post rehab protocol  Person educated: Patient Education method: Explanation, Demonstration, Verbal cues, and Handouts Education comprehension: verbalized understanding, returned demonstration, and verbal cues required   HOME EXERCISE PROGRAM: Access Code: [email or web address] URL: [email or web address] Date: 03/23/2023 Prepared by: Ellin Goodie  Exercises - Chest and Bicep Stretch - Arms Behind Back  - 1 x daily - 3 reps - 30-60 sec  hold - Shoulder PNF D2 Flexion  - 3-4 x weekly - 3 sets - 5 reps - Shoulder Internal Rotation with Resistance  - 3-4 x weekly - 3 sets - 10 reps - Standing  Overhead Shoulder External Rotation Stretch with Towel  - 1 x daily - 3 reps - 30 sec hold - Sidelying Shoulder External Rotation with Dumbbell  - 3-4 x weekly - 3 sets - 10 reps - Standing Low Trap Setting with Resistance at Wall  - 3-4 x weekly - 3 sets - 10 reps - Standing Shoulder Internal Rotation Stretch with Towel  - 1 x daily - 3 reps - 60 sec  hold ASSESSMENT:   CLINICAL IMPRESSION:  Pt is now s/p 15 weeks right total shoulder arthroplasty. She demonstrates improved parascapular strength  with ability to perform single UE row and lat pull downs without excessive upper trap activation. She continues to experience crepitus, but this is not accompanied by pain. She will continue to benefit from skilled therapy to address remaining deficits in order to improve quality of life and return to PLOF.   OBJECTIVE IMPAIRMENTS: decreased ROM, decreased strength, impaired UE functional use, and pain.    ACTIVITY LIMITATIONS: carrying, lifting, bathing, dressing, and reach over head   PARTICIPATION LIMITATIONS: cleaning, laundry, shopping, and occupation   PERSONAL FACTORS: Age, Time since onset of injury/illness/exacerbation, and 1 comorbidity: lumber surgery  are also affecting patient's functional outcome.    REHAB POTENTIAL: Good   CLINICAL DECISION MAKING: Stable/uncomplicated   EVALUATION COMPLEXITY: Low     GOALS: Goals reviewed with patient? No   SHORT TERM GOALS: Target date: 12/03/2022   Pt will be independent with HEP in order to improve strength and balance in order to decrease fall risk and improve function at home and work. Baseline: Not able to do 02/05/23: Performing independently   Goal status: Achieved    2.  Patient will demonstrate knowledge of how to correctly don and doff left shoulder brace in order to be able to do it after surgery.   Baseline: Patient able to perform independently  Goal status: MET       LONG TERM GOALS: Target date: 03/30/2023   Patient will have improved function and activity level as evidenced by an increase in FOTO score by 10 points or more.  Baseline: 36/100 with target of 59 02/05/23: 49/100 with target 59; 5/29: 55/100 03/25/23: 55/100 Goal status: Achieved    2.  Patient will show symmetrical AROM between left shoulder with right shoulder to regain ability to perform reaching tasks for her job.  Baseline: Shoulder Flexion R/L: 180/90*, Shoulder Abduction R/L 180/60, Shoulder combined IR R/L Thoracic spine, PSIS 02/05/23: See ROM  measurements above 03/25/23: Shoulder Flexion R/L 180/170 Shoulder Abduction R/L 180/160 Shoulder Combined ER R/L C8 vertebrae Shoulder  Goal status: Ongoing   3. Patient will show symmetrical strength between left shoulder with right shoulder to regain ability to perform reaching tasks for her job.  Baseline: Unable to flex or abduct LUE past 90 degrees 02/05/23: Still limited with shoulder abduction and IR  03/25/23: Still limited in LUE extension and internal rotation tasks  Goal status: Ongoing     PLAN:   PT FREQUENCY: 1-2x/week   PT DURATION: 10 weeks   PLANNED INTERVENTIONS: Therapeutic exercises, Neuromuscular re-education, Self Care, Joint mobilization, Joint manipulation, Aquatic Therapy, Dry Needling, Electrical stimulation, Spinal manipulation, Spinal mobilization, Cryotherapy, Moist heat, Manual therapy, and Re-evaluation   PLAN FOR NEXT SESSION: ;Lateral and front raises. Continue with single arm rows and lat pull downs.  Wall walks with shoulder abduction. Slides with pillowcase, front raises and lateral raises. Single arm rows . Quadruped position and check in on internal rotation  Ellin Goodie PT, DPT  Physical Therapist - [facility] 03/30/23, 5:38 PM

## 2023-04-01 ENCOUNTER — Ambulatory Visit: Payer: 59 | Admitting: Physical Therapy

## 2023-04-01 ENCOUNTER — Encounter: Payer: Self-pay | Admitting: Physical Therapy

## 2023-04-01 DIAGNOSIS — G8929 Other chronic pain: Secondary | ICD-10-CM | POA: Diagnosis not present

## 2023-04-01 DIAGNOSIS — M19012 Primary osteoarthritis, left shoulder: Secondary | ICD-10-CM | POA: Diagnosis not present

## 2023-04-01 DIAGNOSIS — M25512 Pain in left shoulder: Secondary | ICD-10-CM | POA: Diagnosis not present

## 2023-04-01 NOTE — Therapy (Signed)
OUTPATIENT PHYSICAL THERAPY TREATMENT   Patient Name: Sara Perry MRN: [id number] DOB:[birth year], 62 y.o., female Today's Date: 03/30/2023  PCP: Dr. Dale Manns Choice  REFERRING PROVIDER: Dr. Ramond Marrow   END OF SESSION:   PT End of Session - 03/30/23 1735     Visit Number 21    Number of Visits 25    Date for PT Re-Evaluation 03/30/23    Authorization Type [facility] [employer] [id number]    Authorization Time Period 03/31/23-05/31/23    Authorization - Visit Number 21    Authorization - Number of Visits 25    Progress Note Due on Visit 25    PT Start Time 1735    PT Stop Time 1815    PT Time Calculation (min) 40 min    Activity Tolerance Patient tolerated treatment well    Behavior During Therapy WFL for tasks assessed/performed                Past Medical History:  Diagnosis Date   Arthritis    Complication of anesthesia    pt states she is very sensitive to most drugs, usually needs 1/2 of whatever other people get. She states as a child/teenager she had difficulty waking up after surgery due to the sensitivity that she has to drugs.   Eczema    Frequent UTI    H/O lymphadenopathy    With previous submandibular node removals.   Hypertension    Recurrent sinus infections    Sleep apnea    uses c-pap   Past Surgical History:  Procedure Laterality Date   COLONOSCOPY WITH PROPOFOL N/A 08/25/2017   Procedure: COLONOSCOPY WITH PROPOFOL;  Surgeon: Midge Minium, MD;  Location: [facility];  Service: Endoscopy;  Laterality: N/A;   COLONOSCOPY WITH PROPOFOL N/A 10/17/2022   Procedure: COLONOSCOPY WITH PROPOFOL;  Surgeon: Midge Minium, MD;  Location: [facility];  Service: Endoscopy;  Laterality: N/A;   LUMBAR FUSION  12/26/2019   l4-l5   Lymph Node Removal  1984   NECK SURGERY  1977   H/O Right neck surgery secondary to a benign growth with when patient was 22 or 62 yo   skin lesion extraction  2013   basal cell - Dr Cheree Ditto   TOTAL SHOULDER ARTHROPLASTY Left  12/18/2022   Procedure: TOTAL SHOULDER ARTHROPLASTY;  Surgeon: Bjorn Pippin, MD;  Location: [facility];  Service: Orthopedics;  Laterality: Left;   Uterine Polyps Removed  2004   Patient Active Problem List   Diagnosis Date Noted   Pre-op evaluation 11/16/2022   History of colonic polyps 10/17/2022   Abdominal bruit 09/27/2021   Osteoarthritis 05/30/2021   Ganglion cyst 01/20/2021   Urinary frequency 09/17/2020   Sleep apnea 06/20/2020   Thumb pain, right 06/20/2020   Lumbar stenosis with neurogenic claudication 12/26/2019   Hyperbilirubinemia 12/14/2019   Eye injury 05/26/2019   Left shoulder pain 05/23/2018   Snoring 05/23/2018   Benign neoplasm of descending colon    Benign neoplasm of ascending colon    Vaginal atrophy 03/22/2017   Meniere's disease 08/17/2016   Colon cancer screening 08/11/2015   Fatigue 06/03/2015   Health care maintenance 06/03/2015   Neck pain 02/05/2014   Hypercholesterolemia 02/05/2014   Back pain 12/05/2012   Trigger finger 12/05/2012   Essential hypertension, benign 12/05/2012    REFERRING DIAG: Left shoulder pain, left shoulder osteoarthritis   THERAPY DIAG:  Chronic left shoulder pain  Primary osteoarthritis of left shoulder  Rationale for Evaluation and Treatment  Rehabilitation  PERTINENT HISTORY: Pt reports that she is having left total shoulder surgery on February 22nd. It is a same day surgery. She did PT 4 years ago for her left shoulder and she has also tried PRP and gel injections, but these did not help with her shoulder pain. She has had prior lumbar spine surgery several years ago, and since then she has used arms more rather than rotate, which is why she thinks it caused overuse in left shoulder. She here clicking and popping in shoulder when she reaches above her head followed by pain. She states that she brought her post surgical shoulder brace in and she would like to know more about how to wear it correctly and what  she can and cannot do post surgery like whether she can use her left hand.   PRECAUTIONS: None   SUBJECTIVE:                                                                                                                                                                                      SUBJECTIVE STATEMENT: Pt states that her right shoulder is feeling increased pain and she is not sure why. She feels it mostly in her right upper trap.   PAIN:  Are you having pain? Yes: NPRS scale: 2-3/10 Pain location: Anterior of left shoulder  Pain description: Achy  Aggravating factors: Lifting  Relieving factors: Not lifting    OBJECTIVE: (objective measures completed at initial evaluation unless otherwise dated)   DIAGNOSTIC FINDINGS:  CLINICAL DATA:  Chronic left shoulder pain.   EXAM: CT OF THE UPPER LEFT EXTREMITY WITHOUT CONTRAST   TECHNIQUE: Multidetector CT imaging of the upper left extremity was performed according to the standard protocol.   RADIATION DOSE REDUCTION: This exam was performed according to the departmental dose-optimization program which includes automated exposure control, adjustment of the mA and/or kV according to patient size and/or use of iterative reconstruction technique.   COMPARISON:  Left shoulder x-rays dated May 20, 2018.   FINDINGS: Bones/Joint/Cartilage   No fracture or dislocation.   Severe osteoarthritis of the glenohumeral joint with severe joint space narrowing, bone-on-bone appearance, subchondral sclerosis, subchondral cystic changes and marginal osteophytosis. Moderate joint effusion.   Normal acromioclavicular joint.   Ligaments   Ligaments are suboptimally evaluated by CT.   Muscles and Tendons Grossly intact.  No muscle atrophy.   Soft tissue No fluid collection or hematoma. No soft tissue mass. The visualized left lung is clear.   IMPRESSION: 1. Severe left glenohumeral osteoarthritis.     Electronically Signed    By: Obie Dredge M.D.   On: 11/04/2022 09:32   PATIENT  SURVEYS:  FOTO 36/100 with target of 59    COGNITION: Overall cognitive status: Within functional limits for tasks assessed                                  SENSATION: WFL   POSTURE: No abnormalities noted    UPPER EXTREMITY ROM:    Active / PROM ROM Right eval Left eval 02/05/23 Right  02/05/23 Left  03/25/23 Right  03/25/23 Left   Shoulder flexion 180/180 90/180 180/180 160/150 180 170  Shoulder extension          Shoulder abduction 180/180 60*/60* 180/180 90*/160* 180 160  Shoulder adduction          Shoulder internal rotation (combined)  Thoracic Spine  PSIS   Mid Thoracic Spine  T12/L1    Shoulder external rotation     Occiput Occiput  C8 C8  Elbow flexion          Elbow extension          Wrist flexion          Wrist extension          Wrist ulnar deviation          Wrist radial deviation          Wrist pronation          Wrist supination          (Blank rows = not tested)   UPPER EXTREMITY MMT:   MMT Right eval Left eval Right 02/05/23 Left  02/05/23  Shoulder flexion 5 3+* 4+ 4  Shoulder extension        Shoulder abduction  5 3+  4 4-  Shoulder adduction 5 3+*    Shoulder internal rotation     4 4-  Shoulder external rotation     4 4-  Middle trapezius     4- 3+  Lower trapezius     4- 3-  Elbow flexion        Elbow extension        Wrist flexion        Wrist extension        Wrist ulnar deviation        Wrist radial deviation        Wrist pronation        Wrist supination        Grip strength (lbs)        (Blank rows = not tested)       JOINT MOBILITY TESTING:  Crepitus and firm end field at 90 degrees flexion and abduction    PALPATION:  TTP around entire left shoulder joint              TODAY'S TREATMENT:                             DATE:   04/01/23: All exercises performed on LUE  UBE Seat at 5 with resistance at 3 for 5 min (2.5 fwd, 2.5 bwd) Shoulder Abduction AROM 3 x 10   -Pt reports increased stretching thought anterior portion of shoulder and above spine of scapulae  Serratus Wall Slides with Pillowcase 1 x 5  Serratus Punches with blue band 1 x 10  -min VC for hand position   MANUAL  Trigger point release and massage of right upper trap     03/30/23:  All exercises performed on LUE UBE Seat at 5 with resistance at 3 for 5 min (2.5 fwd, 2.5 bwd) OMEGA Single Arm Row #10 3 x 10  -Pt reports crepitus in shoulder  Shoulder ER at 0 deg abduction with red band 1 x 10  Shoulder IR at 0 deg abduction with green band 1 x 10  -min VC to maintain elbow contact with side wall of body  OMEGA Single Arm Lat Pull Down with overhand grip #5 1 x 3  -Pt unable to maintain an extended elbow  OMEGA Single Arm Lat Pull Down with under hand grip #5 1 x 3  -Pt unable to maintain an extended elbow  Seated Lat Pull Down #20 2 x 10  -Pt reports increased pain in her lumbar spine  Standing Lat Pull Down #15 1 x 10  -Pt compensated with increased upper trap activation  Standing Lat Pull Down #10 1 x 10 -Straight elbow and no upper trap activation   03/25/23: All exercises performed on LUE  UBE Seat at 5 with resistance at 3 for 5 min (2.5 fwd, 2.5 bwd) Shoulder AROM: -Shoulder Flex R/L 180/170 -Shoulder Abd R/L 180/160  -Combined ER R/L C8 vertebrae (symmetrical) -Combined IR R/L Mid Thoracic, T12/L5  Shoulder AAROM IR 3 x 10  -Pt reports burning along insertion of biceps  D1 PNF Flexion using yellow band 1 x 5 -Pt struggles to perform  D1 PNF Flexion using #1 DB 1 x 10  FOTO: 55/100    03/23/23: All exercises performed on LUE  UBE Seat at 5 with resistance at 3 for 5 min (2.5 fwd, 2.5 bwd) Side Lying Shoulder ER at 0 deg abduction with #1 DB 1 x 10 Side Lying Shoulder ER at 0 deg abduction with #2 DB 1 x 10  Standing Shoulder IR at 0 deg abduction with green band 2 x 10  -min VC to decrease rotation of torso  D2 PNF Flexion/Extension 3 x 5    Chest and Biceps  Stretch 3 x 60 sec  Long sitting anterior shoulder and biceps stretch 2 x 30 sec  Biceps Stretch at table 2 x 30 sec  Shoulder Internal Rotation Stretch with towel 2 x 30 sec    PATIENT EDUCATION: Education details: form and technique for appropriate exercise and explanation about post rehab protocol  Person educated: Patient Education method: Programmer, multimedia, Demonstration, Verbal cues, and Handouts Education comprehension: verbalized understanding, returned demonstration, and verbal cues required   HOME EXERCISE PROGRAM:  Access Code: [email or web address] URL: [email or web address] Date: 04/01/2023 Prepared by: Ellin Goodie  Exercises - Shoulder Internal Rotation with Resistance  - 3-4 x weekly - 3 sets - 10 reps - Standing Overhead Shoulder External Rotation Stretch with Towel  - 1 x daily - 3 reps - 30 sec hold - Standing Shoulder Internal Rotation Stretch with Towel  - 1 x daily - 3 reps - 60 sec  hold - Standing Low Trap Setting with Resistance at Wall  - 3-4 x weekly - 3 sets - 10 reps - Standing Single Arm Shoulder PNF D1 Flexion with Anchored Resistance  - 3-4 x weekly - 3 sets - 10 reps - Shoulder Abduction - Thumbs Up  - 3-4 x weekly - 3 sets - 10 reps - Sidelying Shoulder External Rotation with Dumbbell  - 3-4 x weekly - 3 sets - 10 reps - Standing Serratus Punch with Resistance  - 3-4 x weekly - 3 sets - 10 reps  ASSESSMENT:  CLINICAL IMPRESSION:  Pt is now s/p 15 weeks right total shoulder arthroplasty. Pt continues to show high level of participation and motivation. Pt shows improved left shoulder abduction along with serratus activation with performance of shoulder abduction above 90 degrees and serratus punches. She will continue to benefit from skilled therapy to address remaining deficits in order to improve quality of life and return to PLOF.    OBJECTIVE IMPAIRMENTS: decreased ROM, decreased strength, impaired UE functional use, and pain.    ACTIVITY  LIMITATIONS: carrying, lifting, bathing, dressing, and reach over head   PARTICIPATION LIMITATIONS: cleaning, laundry, shopping, and occupation   PERSONAL FACTORS: Age, Time since onset of injury/illness/exacerbation, and 1 comorbidity: lumber surgery  are also affecting patient's functional outcome.    REHAB POTENTIAL: Good   CLINICAL DECISION MAKING: Stable/uncomplicated   EVALUATION COMPLEXITY: Low     GOALS: Goals reviewed with patient? No   SHORT TERM GOALS: Target date: 12/03/2022   Pt will be independent with HEP in order to improve strength and balance in order to decrease fall risk and improve function at home and work. Baseline: Not able to do 02/05/23: Performing independently   Goal status: Achieved    2.  Patient will demonstrate knowledge of how to correctly don and doff left shoulder brace in order to be able to do it after surgery.   Baseline: Patient able to perform independently  Goal status: MET       LONG TERM GOALS: Target date: 03/30/2023   Patient will have improved function and activity level as evidenced by an increase in FOTO score by 10 points or more.  Baseline: 36/100 with target of 59 02/05/23: 49/100 with target 59; 5/29: 55/100 03/25/23: 55/100 Goal status: Achieved    2.  Patient will show symmetrical AROM between left shoulder with right shoulder to regain ability to perform reaching tasks for her job.  Baseline: Shoulder Flexion R/L: 180/90*, Shoulder Abduction R/L 180/60, Shoulder combined IR R/L Thoracic spine, PSIS 02/05/23: See ROM measurements above 03/25/23: Shoulder Flexion R/L 180/170 Shoulder Abduction R/L 180/160 Shoulder Combined ER R/L C8 vertebrae Shoulder  Goal status: Ongoing   3. Patient will show symmetrical strength between left shoulder with right shoulder to regain ability to perform reaching tasks for her job.  Baseline: Unable to flex or abduct LUE past 90 degrees 02/05/23: Still limited with shoulder abduction and IR  03/25/23:  Still limited in LUE extension and internal rotation tasks  Goal status: Ongoing     PLAN:   PT FREQUENCY: 1-2x/week   PT DURATION: 10 weeks   PLANNED INTERVENTIONS: Therapeutic exercises, Neuromuscular re-education, Self Care, Joint mobilization, Joint manipulation, Aquatic Therapy, Dry Needling, Electrical stimulation, Spinal manipulation, Spinal mobilization, Cryotherapy, Moist heat, Manual therapy, and Re-evaluation   PLAN FOR NEXT SESSION: Continue with single arm rows and lat pull downs.  D2 and D1 Flexion and extension patterns  Ellin Goodie PT, DPT  Physical Therapist - [facility] 03/30/23, 5:38 PM

## 2023-04-02 ENCOUNTER — Other Ambulatory Visit: Payer: Self-pay

## 2023-04-06 ENCOUNTER — Ambulatory Visit: Payer: 59

## 2023-04-06 DIAGNOSIS — M25512 Pain in left shoulder: Secondary | ICD-10-CM | POA: Diagnosis not present

## 2023-04-06 DIAGNOSIS — M19012 Primary osteoarthritis, left shoulder: Secondary | ICD-10-CM

## 2023-04-06 DIAGNOSIS — G8929 Other chronic pain: Secondary | ICD-10-CM | POA: Diagnosis not present

## 2023-04-06 NOTE — Therapy (Signed)
OUTPATIENT PHYSICAL THERAPY TREATMENT   Patient Name: Sara Perry MRN: [id number] DOB:May 08, 1961, 62 y.o., female Today's Date: 04/06/2023  PCP: Dr. Dale [place]  REFERRING PROVIDER: Dr. Ramond Marrow   END OF SESSION:   PT End of Session - 04/06/23 1745     Visit Number 23    Number of Visits 25    Date for PT Re-Evaluation 03/30/23    Authorization Type [facility] [employer] [id number]    Authorization Time Period 03/31/23-05/31/23    Authorization - Visit Number 23    Authorization - Number of Visits 25    Progress Note Due on Visit 25    PT Start Time 1740    PT Stop Time 1815    PT Time Calculation (min) 35 min    Activity Tolerance Patient tolerated treatment well;No increased pain    Behavior During Therapy WFL for tasks assessed/performed                Past Medical History:  Diagnosis Date   Arthritis    Complication of anesthesia    pt states she is very sensitive to most drugs, usually needs 1/2 of whatever other people get. She states as a child/teenager she had difficulty waking up after surgery due to the sensitivity that she has to drugs.   Eczema    Frequent UTI    H/O lymphadenopathy    With previous submandibular node removals.   Hypertension    Recurrent sinus infections    Sleep apnea    uses c-pap   Past Surgical History:  Procedure Laterality Date   COLONOSCOPY WITH PROPOFOL N/A 08/25/2017   Procedure: COLONOSCOPY WITH PROPOFOL;  Surgeon: Midge Minium, MD;  Location: [facility] END[facility]COPY;  Service: Endoscopy;  Laterality: N/A;   COLONOSCOPY WITH PROPOFOL N/A 10/17/2022   Procedure: COLONOSCOPY WITH PROPOFOL;  Surgeon: Midge Minium, MD;  Location: [facility];  Service: Endoscopy;  Laterality: N/A;   LUMBAR FUSION  12/26/2019   l4-l5   Lymph Node Removal  1984   NECK SURGERY  1977   H/O Right neck surgery secondary to a benign growth with when patient was 52 or 62 yo   skin lesion extraction  2013   basal cell - Dr Cheree Ditto   TOTAL SHOULDER  ARTHROPLASTY Left 12/18/2022   Procedure: TOTAL SHOULDER ARTHROPLASTY;  Surgeon: Bjorn Pippin, MD;  [facility];  Service: Orthopedics;  Laterality: Left;   Uterine Polyps Removed  2004   Patient Active Problem List   Diagnosis Date Noted   Pre-op evaluation 11/16/2022   History of colonic polyps 10/17/2022   Abdominal bruit 09/27/2021   Osteoarthritis 05/30/2021   Ganglion cyst 01/20/2021   Urinary frequency 09/17/2020   Sleep apnea 06/20/2020   Thumb pain, right 06/20/2020   Lumbar stenosis with neurogenic claudication 12/26/2019   Hyperbilirubinemia 12/14/2019   Eye injury 05/26/2019   Left shoulder pain 05/23/2018   Snoring 05/23/2018   Benign neoplasm of descending colon    Benign neoplasm of ascending colon    Vaginal atrophy 03/22/2017   Meniere's disease 08/17/2016   Colon cancer screening 08/11/2015   Fatigue 06/03/2015   Health care maintenance 06/03/2015   Neck pain 02/05/2014   Hypercholesterolemia 02/05/2014   Back pain 12/05/2012   Trigger finger 12/05/2012   Essential hypertension, benign 12/05/2012    REFERRING DIAG: Left shoulder pain, left shoulder osteoarthritis   THERAPY DIAG:  Chronic left shoulder pain  Primary osteoarthritis of left shoulder  Rationale for Evaluation  and Treatment Rehabilitation  PERTINENT HISTORY: Pt reports that she is having left total shoulder surgery on February 22nd. It is a same day surgery. She did PT 4 years ago for her left shoulder and she has also tried PRP and gel injections, but these did not help with her shoulder pain. She has had prior lumbar spine surgery several years ago, and since then she has used arms more rather than rotate, which is why she thinks it caused overuse in left shoulder. She here clicking and popping in shoulder when she reaches above her head followed by pain. She states that she brought her post surgical shoulder brace in and she would like to know more about how to wear it  correctly and what she can and cannot do post surgery like whether she can use her left hand.   PRECAUTIONS: None   SUBJECTIVE:                                                                                                                                                                                      SUBJECTIVE STATEMENT: Pt doing well today in general. HEP doing just fine.    PAIN:  Are you having pain? No, just soreness in upper back    OBJECTIVE:              TODAY'S TREATMENT:                             DATE:   04/06/23 UBE Seat at 5 with resistance at 3 for 5 min (2.5 fwd, 2.5 bwd) -LUE single arm row, Red TB loop 1x12 -Seated Lat Pull Down #15 x 12  -standing wall pushup 1x15 -Standing bilat ER with red band 1 x 12  -Standing Left ABDCT 5 c 1lb, then 12 @ 0lb  -standing Left flexion to 90 0lb x15  -Standing bilat ER with red band 1 x 12  -Standing shoulder flexion 1x15 c 1lb free weight  -standing shoulder ABDCT 1x10 c 6oz weight  04/01/23: All exercises performed on LUE  UBE Seat at 5 with resistance at 3 for 5 min (2.5 fwd, 2.5 bwd) Shoulder Abduction AROM 3 x 10  -Pt reports increased stretching thought anterior portion of shoulder and above spine of scapulae  Serratus Wall Slides with Pillowcase 1 x 5  Serratus Punches with blue band 1 x 10  -min VC for hand position   MANUAL  Trigger point release and massage of right upper trap     03/30/23: All exercises performed on LUE UBE Seat at 5 with resistance at 3 for 5  min (2.5 fwd, 2.5 bwd) OMEGA Single Arm Row #10 3 x 10  -Pt reports crepitus in shoulder  Shoulder ER at 0 deg abduction with red band 1 x 10  Shoulder IR at 0 deg abduction with green band 1 x 10  -min VC to maintain elbow contact with side wall of body  OMEGA Single Arm Lat Pull Down with overhand grip #5 1 x 3  -Pt unable to maintain an extended elbow  OMEGA Single Arm Lat Pull Down with under hand grip #5 1 x 3  -Pt unable to maintain  an extended elbow  Seated Lat Pull Down #20 2 x 10  -Pt reports increased pain in her lumbar spine  Standing Lat Pull Down #15 1 x 10  -Pt compensated with increased upper trap activation  Standing Lat Pull Down #10 1 x 10 -Straight elbow and no upper trap activation       PATIENT EDUCATION: Education details: form and technique for appropriate exercise and explanation about post rehab protocol  Person educated: Patient Education method: Explanation, Demonstration, Verbal cues, and Handouts Education comprehension: verbalized understanding, returned demonstration, and verbal cues required   HOME EXERCISE PROGRAM:  Access Code: [email or web address] URL: [email or web address] Date: 04/01/2023 Prepared by: Ellin Goodie  Exercises - Shoulder Internal Rotation with Resistance  - 3-4 x weekly - 3 sets - 10 reps - Standing Overhead Shoulder External Rotation Stretch with Towel  - 1 x daily - 3 reps - 30 sec hold - Standing Shoulder Internal Rotation Stretch with Towel  - 1 x daily - 3 reps - 60 sec  hold - Standing Low Trap Setting with Resistance at Wall  - 3-4 x weekly - 3 sets - 10 reps - Standing Single Arm Shoulder PNF D1 Flexion with Anchored Resistance  - 3-4 x weekly - 3 sets - 10 reps - Shoulder Abduction - Thumbs Up  - 3-4 x weekly - 3 sets - 10 reps - Sidelying Shoulder External Rotation with Dumbbell  - 3-4 x weekly - 3 sets - 10 reps - Standing Serratus Punch with Resistance  - 3-4 x weekly - 3 sets - 10 reps  ASSESSMENT:   CLINICAL IMPRESSION:  Pt continue to progress in strength, ROM, loading tolerance. Continues to advance resistance loading interventions as able Shoulder ABDCT remains fairly weak comparable to other adjacent muscle groups, but at adjusted resisted level tolerates reps all the same. No progression in pain after session, just soreness and fatigue in a predictable fashion. She will continue to benefit from skilled therapy to address remaining deficits  in order to improve quality of life and return to PLOF.    OBJECTIVE IMPAIRMENTS: decreased ROM, decreased strength, impaired UE functional use, and pain.    ACTIVITY LIMITATIONS: carrying, lifting, bathing, dressing, and reach over head   PARTICIPATION LIMITATIONS: cleaning, laundry, shopping, and occupation   PERSONAL FACTORS: Age, Time since onset of injury/illness/exacerbation, and 1 comorbidity: lumber surgery  are also affecting patient's functional outcome.    REHAB POTENTIAL: Good   CLINICAL DECISION MAKING: Stable/uncomplicated   EVALUATION COMPLEXITY: Low     GOALS: Goals reviewed with patient? No   SHORT TERM GOALS: Target date: 12/03/2022   Pt will be independent with HEP in order to improve strength and balance in order to decrease fall risk and improve function at home and work. Baseline: Not able to do 02/05/23: Performing independently   Goal status: Achieved    2.  Patient will demonstrate knowledge of how to  correctly don and doff left shoulder brace in order to be able to do it after surgery.   Baseline: Patient able to perform independently  Goal status: MET       LONG TERM GOALS: Target date: 03/30/2023   Patient will have improved function and activity level as evidenced by an increase in FOTO score by 10 points or more.  Baseline: 36/100 with target of 59 02/05/23: 49/100 with target 59; 5/29: 55/100 03/25/23: 55/100 Goal status: Achieved    2.  Patient will show symmetrical AROM between left shoulder with right shoulder to regain ability to perform reaching tasks for her job.  Baseline: Shoulder Flexion R/L: 180/90*, Shoulder Abduction R/L 180/60, Shoulder combined IR R/L Thoracic spine, PSIS 02/05/23: See ROM measurements above 03/25/23: Shoulder Flexion R/L 180/170 Shoulder Abduction R/L 180/160 Shoulder Combined ER R/L C8 vertebrae Shoulder  Goal status: Ongoing   3. Patient will show symmetrical strength between left shoulder with right shoulder to  regain ability to perform reaching tasks for her job.  Baseline: Unable to flex or abduct LUE past 90 degrees 02/05/23: Still limited with shoulder abduction and IR  03/25/23: Still limited in LUE extension and internal rotation tasks  Goal status: Ongoing     PLAN:   PT FREQUENCY: 1-2x/week   PT DURATION: 10 weeks   PLANNED INTERVENTIONS: Therapeutic exercises, Neuromuscular re-education, Self Care, Joint mobilization, Joint manipulation, Aquatic Therapy, Dry Needling, Electrical stimulation, Spinal manipulation, Spinal mobilization, Cryotherapy, Moist heat, Manual therapy, and Re-evaluation   PLAN FOR NEXT SESSION: Continue with single arm rows and lat pull downs.  D2 and D1 Flexion and extension patterns   5:48 PM, 04/06/23 Rosamaria Lints, PT, DPT Physical Therapist - [place] [phone] (Office)   04/06/23, 5:48 PM

## 2023-04-08 ENCOUNTER — Ambulatory Visit: Payer: 59

## 2023-04-08 DIAGNOSIS — M19012 Primary osteoarthritis, left shoulder: Secondary | ICD-10-CM

## 2023-04-08 DIAGNOSIS — G8929 Other chronic pain: Secondary | ICD-10-CM

## 2023-04-08 DIAGNOSIS — M25512 Pain in left shoulder: Secondary | ICD-10-CM | POA: Diagnosis not present

## 2023-04-08 NOTE — Therapy (Signed)
OUTPATIENT PHYSICAL THERAPY TREATMENT   Patient Name: Sara Perry MRN: [id number] DOB:[birth year], 62 y.o., female Today's Date: 04/08/2023  PCP: Dr. Dale [place]  REFERRING PROVIDER: Dr. Ramond Marrow   END OF SESSION:   PT End of Session - 04/08/23 1737     Visit Number 24    Number of Visits 25    Date for PT Re-Evaluation 03/30/23    Authorization Type [facility] [employer] [id number]    Authorization Time Period 03/31/23-05/31/23    Authorization - Visit Number 24    Authorization - Number of Visits 25    Progress Note Due on Visit 25    PT Start Time 1730    PT Stop Time 1610    PT Time Calculation (min) 1360 min    Activity Tolerance Patient tolerated treatment well;No increased pain    Behavior During Therapy WFL for tasks assessed/performed                Past Medical History:  Diagnosis Date   Arthritis    Complication of anesthesia    pt states she is very sensitive to most drugs, usually needs 1/2 of whatever other people get. She states as a child/teenager she had difficulty waking up after surgery due to the sensitivity that she has to drugs.   Eczema    Frequent UTI    H/O lymphadenopathy    With previous submandibular node removals.   Hypertension    Recurrent sinus infections    Sleep apnea    uses c-pap   Past Surgical History:  Procedure Laterality Date   COLONOSCOPY WITH PROPOFOL N/A 08/25/2017   Procedure: COLONOSCOPY WITH PROPOFOL;  Surgeon: Midge Minium, MD;  Location: [facility];  Service: Endoscopy;  Laterality: N/A;   COLONOSCOPY WITH PROPOFOL N/A 10/17/2022   Procedure: COLONOSCOPY WITH PROPOFOL;  Surgeon: Midge Minium, MD;  Location: [facility];  Service: Endoscopy;  Laterality: N/A;   LUMBAR FUSION  12/26/2019   l4-l5   Lymph Node Removal  1984   NECK SURGERY  1977   H/O Right neck surgery secondary to a benign growth with when patient was 43 or 62 yo   skin lesion extraction  2013   basal cell - Dr Cheree Ditto   TOTAL SHOULDER  ARTHROPLASTY Left 12/18/2022   Procedure: TOTAL SHOULDER ARTHROPLASTY;  Surgeon: Bjorn Pippin, MD;  Location: [facility];  Service: Orthopedics;  Laterality: Left;   Uterine Polyps Removed  2004   Patient Active Problem List   Diagnosis Date Noted   Pre-op evaluation 11/16/2022   History of colonic polyps 10/17/2022   Abdominal bruit 09/27/2021   Osteoarthritis 05/30/2021   Ganglion cyst 01/20/2021   Urinary frequency 09/17/2020   Sleep apnea 06/20/2020   Thumb pain, right 06/20/2020   Lumbar stenosis with neurogenic claudication 12/26/2019   Hyperbilirubinemia 12/14/2019   Eye injury 05/26/2019   Left shoulder pain 05/23/2018   Snoring 05/23/2018   Benign neoplasm of descending colon    Benign neoplasm of ascending colon    Vaginal atrophy 03/22/2017   Meniere's disease 08/17/2016   Colon cancer screening 08/11/2015   Fatigue 06/03/2015   Health care maintenance 06/03/2015   Neck pain 02/05/2014   Hypercholesterolemia 02/05/2014   Back pain 12/05/2012   Trigger finger 12/05/2012   Essential hypertension, benign 12/05/2012    REFERRING DIAG: Left shoulder pain, left shoulder osteoarthritis   THERAPY DIAG:  Chronic left shoulder pain  Primary osteoarthritis of left shoulder  Rationale for Evaluation  and Treatment Rehabilitation  PERTINENT HISTORY: Pt reports that she is having left total shoulder surgery on February 22nd. It is a same day surgery. She did PT 4 years ago for her left shoulder and she has also tried PRP and gel injections, but these did not help with her shoulder pain. She has had prior lumbar spine surgery several years ago, and since then she has used arms more rather than rotate, which is why she thinks it caused overuse in left shoulder. She here clicking and popping in shoulder when she reaches above her head followed by pain. She states that she brought her post surgical shoulder brace in and she would like to know more about how to wear it  correctly and what she can and cannot do post surgery like whether she can use her left hand.   PRECAUTIONS: None   SUBJECTIVE:                                                                                                                                                                                      SUBJECTIVE STATEMENT:  Pt doing fine. Normal aches and pains. HEP going well. Pt also increasing prior fitness programs.   PAIN:  Are you having pain? No, just soreness in upper back   OBJECTIVE:              TODAY'S TREATMENT:                             DATE:    04/08/23 -UBE Seat at 5 with resistance at 3 for 5 min (2.5 fwd, 2.5 bwd) -whole buncha mobility work: flexion ball roll outs, ABDCT walkout with TRX strap  -lat pulldown 15lb 1x15  -chest press 10lb 1x15 -lat pulldown 20lb 1x10  -chest press 10lb 1x15  -single LUE row redTB loop 1x12 -single LUE shoulder flexion 1x10, 1lb FW   -LUE ABDCT to 90 1x10  -single LUE row redTB loop 1x12 -single LUE shoulder flexion 1x10, 1lb FW   -LUE ABDCT to 90 1x10   Right sidelying LUE ABDCT 0-90 x15 (given for home)    04/06/23 UBE Seat at 5 with resistance at 3 for 5 min (2.5 fwd, 2.5 bwd) -LUE single arm row, Red TB loop 1x12 -Seated Lat Pull Down #15 x 12  -standing wall pushup 1x15 -Standing bilat ER with red band 1 x 12  -Standing Left ABDCT 5 c 1lb, then 12 @ 0lb  -standing Left flexion to 90 0lb x15  -Standing bilat ER with red band 1 x 12  -Standing shoulder flexion 1x15 c 1lb free  weight  -standing shoulder ABDCT 1x10 c 6oz weight  04/01/23: All exercises performed on LUE  UBE Seat at 5 with resistance at 3 for 5 min (2.5 fwd, 2.5 bwd) Shoulder Abduction AROM 3 x 10  -Pt reports increased stretching thought anterior portion of shoulder and above spine of scapulae  Serratus Wall Slides with Pillowcase 1 x 5  Serratus Punches with blue band 1 x 10  -min VC for hand position   MANUAL  Trigger point release and  massage of right upper trap     03/30/23: All exercises performed on LUE UBE Seat at 5 with resistance at 3 for 5 min (2.5 fwd, 2.5 bwd) OMEGA Single Arm Row #10 3 x 10  -Pt reports crepitus in shoulder  Shoulder ER at 0 deg abduction with red band 1 x 10  Shoulder IR at 0 deg abduction with green band 1 x 10  -min VC to maintain elbow contact with side wall of body  OMEGA Single Arm Lat Pull Down with overhand grip #5 1 x 3  -Pt unable to maintain an extended elbow  OMEGA Single Arm Lat Pull Down with under hand grip #5 1 x 3  -Pt unable to maintain an extended elbow  Seated Lat Pull Down #20 2 x 10  -Pt reports increased pain in her lumbar spine  Standing Lat Pull Down #15 1 x 10  -Pt compensated with increased upper trap activation  Standing Lat Pull Down #10 1 x 10 -Straight elbow and no upper trap activation       PATIENT EDUCATION: Education details: form and technique for appropriate exercise and explanation about post rehab protocol  Person educated: Patient Education method: Explanation, Demonstration, Verbal cues, and Handouts Education comprehension: verbalized understanding, returned demonstration, and verbal cues required   HOME EXERCISE PROGRAM:  Access Code: [email or web address] URL: [email or web address] Date: 04/01/2023 Prepared by: Ellin Goodie  Exercises - Shoulder Internal Rotation with Resistance  - 3-4 x weekly - 3 sets - 10 reps - Standing Overhead Shoulder External Rotation Stretch with Towel  - 1 x daily - 3 reps - 30 sec hold - Standing Shoulder Internal Rotation Stretch with Towel  - 1 x daily - 3 reps - 60 sec  hold - Standing Low Trap Setting with Resistance at Wall  - 3-4 x weekly - 3 sets - 10 reps - Standing Single Arm Shoulder PNF D1 Flexion with Anchored Resistance  - 3-4 x weekly - 3 sets - 10 reps - Shoulder Abduction - Thumbs Up  - 3-4 x weekly - 3 sets - 10 reps - Sidelying Shoulder External Rotation with Dumbbell  - 3-4 x weekly -  3 sets - 10 reps - Standing Serratus Punch with Resistance  - 3-4 x weekly - 3 sets - 10 reps  ASSESSMENT:   CLINICAL IMPRESSION:  Pt continue to progress in strength, ROM, loading tolerance. Continues to advance resistance loading interventions as able Shoulder ABDCT remains fairly weak comparable to other adjacent muscle groups, but at adjusted resisted level tolerates reps all the same. No progression in pain after session, just soreness and fatigue in a predictable fashion. She will continue to benefit from skilled therapy to address remaining deficits in order to improve quality of life and return to PLOF.    OBJECTIVE IMPAIRMENTS: decreased ROM, decreased strength, impaired UE functional use, and pain.    ACTIVITY LIMITATIONS: carrying, lifting, bathing, dressing, and reach over head   PARTICIPATION LIMITATIONS: cleaning, laundry, shopping, and occupation  PERSONAL FACTORS: Age, Time since onset of injury/illness/exacerbation, and 1 comorbidity: lumber surgery  are also affecting patient's functional outcome.    REHAB POTENTIAL: Good   CLINICAL DECISION MAKING: Stable/uncomplicated   EVALUATION COMPLEXITY: Low     GOALS: Goals reviewed with patient? No   SHORT TERM GOALS: Target date: 12/03/2022   Pt will be independent with HEP in order to improve strength and balance in order to decrease fall risk and improve function at home and work. Baseline: Not able to do 02/05/23: Performing independently   Goal status: Achieved    2.  Patient will demonstrate knowledge of how to correctly don and doff left shoulder brace in order to be able to do it after surgery.   Baseline: Patient able to perform independently  Goal status: MET      LONG TERM GOALS: Target date: 03/30/2023   Patient will have improved function and activity level as evidenced by an increase in FOTO score by 10 points or more.  Baseline: 36/100 with target of 59 02/05/23: 49/100 with target 59; 5/29: 55/100  03/25/23: 55/100 Goal status: Achieved    2.  Patient will show symmetrical AROM between left shoulder with right shoulder to regain ability to perform reaching tasks for her job.  Baseline: Shoulder Flexion R/L: 180/90*, Shoulder Abduction R/L 180/60, Shoulder combined IR R/L Thoracic spine, PSIS 02/05/23: See ROM measurements above 03/25/23: Shoulder Flexion R/L 180/170 Shoulder Abduction R/L 180/160 Shoulder Combined ER R/L C8 vertebrae Shoulder  Goal status: Ongoing   3. Patient will show symmetrical strength between left shoulder with right shoulder to regain ability to perform reaching tasks for her job.  Baseline: Unable to flex or abduct LUE past 90 degrees 02/05/23: Still limited with shoulder abduction and IR  03/25/23: Still limited in LUE extension and internal rotation tasks  Goal status: Ongoing     PLAN:   PT FREQUENCY: 1-2x/week   PT DURATION: 10 weeks   PLANNED INTERVENTIONS: Therapeutic exercises, Neuromuscular re-education, Self Care, Joint mobilization, Joint manipulation, Aquatic Therapy, Dry Needling, Electrical stimulation, Spinal manipulation, Spinal mobilization, Cryotherapy, Moist heat, Manual therapy, and Re-evaluation   PLAN FOR NEXT SESSION: update DC HEP with progression. Will be visit 25, not clear if she will have not visits available.    6:04 PM, 04/08/23 Rosamaria Lints, PT, DPT Physical Therapist -  [phone] (Office)   04/08/23, 6:04 PM

## 2023-04-13 ENCOUNTER — Other Ambulatory Visit: Payer: Self-pay

## 2023-04-13 ENCOUNTER — Other Ambulatory Visit: Payer: Self-pay | Admitting: Internal Medicine

## 2023-04-13 MED FILL — Telmisartan Tab 40 MG: ORAL | 30 days supply | Qty: 30 | Fill #3 | Status: AC

## 2023-04-14 ENCOUNTER — Other Ambulatory Visit: Payer: Self-pay

## 2023-04-15 ENCOUNTER — Ambulatory Visit: Payer: 59 | Attending: Orthopaedic Surgery | Admitting: Physical Therapy

## 2023-04-15 DIAGNOSIS — G8929 Other chronic pain: Secondary | ICD-10-CM | POA: Insufficient documentation

## 2023-04-15 DIAGNOSIS — M25512 Pain in left shoulder: Secondary | ICD-10-CM | POA: Insufficient documentation

## 2023-04-15 DIAGNOSIS — M19012 Primary osteoarthritis, left shoulder: Secondary | ICD-10-CM | POA: Insufficient documentation

## 2023-04-15 NOTE — Therapy (Addendum)
OUTPATIENT PHYSICAL THERAPY TREATMENT   Patient Name: Sara Perry MRN: [id number] DOB:1961/07/21, 62 y.o., female Today's Date: 04/15/2023  PCP: Dr. Dale Waverly  REFERRING PROVIDER: Dr. Ramond Marrow   END OF SESSION:   PT End of Session - 04/15/23 1759     Visit Number 25    Number of Visits 25    Date for PT Re-Evaluation 03/30/23    Authorization Type [facility] [employer] [id number]    Authorization Time Period 03/31/23-05/31/23    Authorization - Visit Number 25    Authorization - Number of Visits 25    Progress Note Due on Visit 25    PT Start Time 1800    PT Stop Time 1845    PT Time Calculation (min) 45 min    Activity Tolerance Patient tolerated treatment well;No increased pain    Behavior During Therapy WFL for tasks assessed/performed                 Past Medical History:  Diagnosis Date   Arthritis    Complication of anesthesia    pt states she is very sensitive to most drugs, usually needs 1/2 of whatever other people get. She states as a child/teenager she had difficulty waking up after surgery due to the sensitivity that she has to drugs.   Eczema    Frequent UTI    H/O lymphadenopathy    With previous submandibular node removals.   Hypertension    Recurrent sinus infections    Sleep apnea    uses c-pap   Past Surgical History:  Procedure Laterality Date   COLONOSCOPY WITH PROPOFOL N/A 08/25/2017   Procedure: COLONOSCOPY WITH PROPOFOL;  Surgeon: Midge Minium, MD;  Location: [facility] ENDOSCOPY;  Service: Endoscopy;  Laterality: N/A;   COLONOSCOPY WITH PROPOFOL N/A 10/17/2022   Procedure: COLONOSCOPY WITH PROPOFOL;  Surgeon: Midge Minium, MD;  Location: [facility];  Service: Endoscopy;  Laterality: N/A;   LUMBAR FUSION  12/26/2019   l4-l5   Lymph Node Removal  1984   NECK SURGERY  1977   H/O Right neck surgery secondary to a benign growth with when patient was 53 or 63 yo   skin lesion extraction  2013   basal cell - Dr Cheree Ditto   TOTAL SHOULDER  ARTHROPLASTY Left 12/18/2022   Procedure: TOTAL SHOULDER ARTHROPLASTY;  Surgeon: Bjorn Pippin, MD;  Location:  SURGERY CENTER;  Service: Orthopedics;  Laterality: Left;   Uterine Polyps Removed  2004   Patient Active Problem List   Diagnosis Date Noted   Pre-op evaluation 11/16/2022   History of colonic polyps 10/17/2022   Abdominal bruit 09/27/2021   Osteoarthritis 05/30/2021   Ganglion cyst 01/20/2021   Urinary frequency 09/17/2020   Sleep apnea 06/20/2020   Thumb pain, right 06/20/2020   Lumbar stenosis with neurogenic claudication 12/26/2019   Hyperbilirubinemia 12/14/2019   Eye injury 05/26/2019   Left shoulder pain 05/23/2018   Snoring 05/23/2018   Benign neoplasm of descending colon    Benign neoplasm of ascending colon    Vaginal atrophy 03/22/2017   Meniere's disease 08/17/2016   Colon cancer screening 08/11/2015   Fatigue 06/03/2015   Health care maintenance 06/03/2015   Neck pain 02/05/2014   Hypercholesterolemia 02/05/2014   Back pain 12/05/2012   Trigger finger 12/05/2012   Essential hypertension, benign 12/05/2012    REFERRING DIAG: Left shoulder pain, left shoulder osteoarthritis   THERAPY DIAG:  Chronic left shoulder pain  Primary osteoarthritis of left shoulder  Rationale for  Evaluation and Treatment Rehabilitation  PERTINENT HISTORY: Pt reports that she is having left total shoulder surgery on February 22nd. It is a same day surgery. She did PT 4 years ago for her left shoulder and she has also tried PRP and gel injections, but these did not help with her shoulder pain. She has had prior lumbar spine surgery several years ago, and since then she has used arms more rather than rotate, which is why she thinks it caused overuse in left shoulder. She here clicking and popping in shoulder when she reaches above her head followed by pain. She states that she brought her post surgical shoulder brace in and she would like to know more about how to wear it  correctly and what she can and cannot do post surgery like whether she can use her left hand.   PRECAUTIONS: None   SUBJECTIVE:                                                                                                                                                                                      SUBJECTIVE STATEMENT:  Pt states that she is continuing to do exercises and she needs to often warm-up to do more overhead. She inquired about whether shoulder replacement would be able to reach symmetrical ROM and PA told her she could but just to listen to her body and whether she is still feeling pain   PAIN:  Are you having pain? No, just soreness in upper back   OBJECTIVE:              TODAY'S TREATMENT:                             DATE:   04/15/23: UBE Seat at 5 with resistance at 3 for 5 min (2.5 fwd, 2.5 bwd)  Shoulder AROM: Shoulder Flexion R/L 180/170 , Shoulder Abduction R/L 180/160, Shoulder Combined ER R/L C8 vertebrae, Shoulder combined IR R/L mid thoracic along bra line   Shoulder MMT: Shoulder Flex R/L 4+/4+ Shoulder Abd R/L 4+/4 Shoulder ER at 0 deg 4+/4+ Shoulder IR at 0 deg 4/4-  Shoulder IR at 0 deg abduction with red band 2 x 10  PNF D2 Flexion on LUE 2 x 10  -cervical rotation to side of arm for improved resolution of crepitus  Shoulder Abduction to 90 degrees LUE 2 x 10 Shoulder Abduction standing against wall to 90 degrees 1 x 10 -Pt reports less fatigue in scapular musculature   04/08/23 -UBE Seat at 5 with resistance at 3 for 5 min (2.5 fwd, 2.5 bwd) -whole buncha mobility  work: flexion ball roll outs, ABDCT walkout with TRX strap  -lat pulldown 15lb 1x15  -chest press 10lb 1x15 -lat pulldown 20lb 1x10  -chest press 10lb 1x15  -single LUE row redTB loop 1x12 -single LUE shoulder flexion 1x10, 1lb FW   -LUE ABDCT to 90 1x10  -single LUE row redTB loop 1x12 -single LUE shoulder flexion 1x10, 1lb FW   -LUE ABDCT to 90 1x10   Right sidelying  LUE ABDCT 0-90 x15 (given for home)    04/06/23 UBE Seat at 5 with resistance at 3 for 5 min (2.5 fwd, 2.5 bwd) -LUE single arm row, Red TB loop 1x12 -Seated Lat Pull Down #15 x 12  -standing wall pushup 1x15 -Standing bilat ER with red band 1 x 12  -Standing Left ABDCT 5 c 1lb, then 12 @ 0lb  -standing Left flexion to 90 0lb x15  -Standing bilat ER with red band 1 x 12  -Standing shoulder flexion 1x15 c 1lb free weight  -standing shoulder ABDCT 1x10 c 6oz weight  04/01/23: All exercises performed on LUE  UBE Seat at 5 with resistance at 3 for 5 min (2.5 fwd, 2.5 bwd) Shoulder Abduction AROM 3 x 10  -Pt reports increased stretching thought anterior portion of shoulder and above spine of scapulae  Serratus Wall Slides with Pillowcase 1 x 5  Serratus Punches with blue band 1 x 10  -min VC for hand position   MANUAL  Trigger point release and massage of right upper trap     PATIENT EDUCATION: Education details: form and technique for appropriate exercise and explanation about post rehab protocol  Person educated: Patient Education method: Explanation, Demonstration, Verbal cues, and Handouts Education comprehension: verbalized understanding, returned demonstration, and verbal cues required   HOME EXERCISE PROGRAM:  Access Code: [email or web address] URL: [email or web address] Date: 04/01/2023 Prepared by: Ellin Goodie  Exercises - Shoulder Internal Rotation with Resistance  - 3-4 x weekly - 3 sets - 10 reps - Standing Overhead Shoulder External Rotation Stretch with Towel  - 1 x daily - 3 reps - 30 sec hold - Standing Shoulder Internal Rotation Stretch with Towel  - 1 x daily - 3 reps - 60 sec  hold - Standing Low Trap Setting with Resistance at Wall  - 3-4 x weekly - 3 sets - 10 reps - Standing Single Arm Shoulder PNF D1 Flexion with Anchored Resistance  - 3-4 x weekly - 3 sets - 10 reps - Shoulder Abduction - Thumbs Up  - 3-4 x weekly - 3 sets - 10 reps - Sidelying  Shoulder External Rotation with Dumbbell  - 3-4 x weekly - 3 sets - 10 reps - Standing Serratus Punch with Resistance  - 3-4 x weekly - 3 sets - 10 reps  ASSESSMENT:   CLINICAL IMPRESSION:  Pt shows improvement with right shoulder strength and ROM, but she is still limited in reaching end ROM and with shoulder abduction and supscapularis strength. Noted crepitus with movement at end range indicates ongoing stiffness. Awaiting further approval from insurance to determine whether patients receives additional visits to continue with plan of care and meet remainder of goals Pt will benefit from further physical therapy to improve shoulder strength and restore normal scapulohumeral rhythm to perform left upper extremity activities for her job such as lifting and reaching overhead.   OBJECTIVE IMPAIRMENTS: decreased ROM, decreased strength, impaired UE functional use, and pain.    ACTIVITY LIMITATIONS: carrying, lifting, bathing, dressing, and reach over head   PARTICIPATION LIMITATIONS:  cleaning, laundry, shopping, and occupation   PERSONAL FACTORS: Age, Time since onset of injury/illness/exacerbation, and 1 comorbidity: lumber surgery  are also affecting patient's functional outcome.    REHAB POTENTIAL: Good   CLINICAL DECISION MAKING: Stable/uncomplicated   EVALUATION COMPLEXITY: Low     GOALS: Goals reviewed with patient? No   SHORT TERM GOALS: Target date: 12/03/2022   Pt will be independent with HEP in order to improve strength and balance in order to decrease fall risk and improve function at home and work. Baseline: Not able to do 02/05/23: Performing independently   Goal status: Achieved    2.  Patient will demonstrate knowledge of how to correctly don and doff left shoulder brace in order to be able to do it after surgery.   Baseline: Patient able to perform independently  Goal status: MET      LONG TERM GOALS: Target date: 03/30/2023   Patient will have improved function and  activity level as evidenced by an increase in FOTO score by 10 points or more.  Baseline: 36/100 with target of 59 02/05/23: 49/100 with target 59; 5/29: 55/100 03/25/23: 55/100 Goal status: Achieved    2.  Patient will show symmetrical AROM between left shoulder with right shoulder to regain ability to perform reaching tasks for her job.  Baseline: Shoulder Flexion R/L: 180/90*, Shoulder Abduction R/L 180/60, Shoulder combined IR R/L Thoracic spine, PSIS 02/05/23: See ROM measurements above 03/25/23: Shoulder Flexion R/L 180/170 Shoulder Abduction R/L 180/160 Shoulder Combined ER R/L C8 vertebrae Shoulder 04/15/23: Shoulder Flexion R/L 180/170 Shoulder Abduction R/L 180/160 Shoulder Combined ER R/L C8 vertebrae Shoulder Goal status: Ongoing   3. Patient will show symmetrical strength between left shoulder with right shoulder to regain ability to perform reaching tasks for her job.  Baseline: Unable to flex or abduct LUE past 90 degrees 02/05/23: Still limited with shoulder abduction and IR  03/25/23: Still limited in LUE extension and internal rotation tasks  Goal status: Ongoing     PLAN:   PT FREQUENCY: 1-2x/week   PT DURATION: 10 weeks   PLANNED INTERVENTIONS: Therapeutic exercises, Neuromuscular re-education, Self Care, Joint mobilization, Joint manipulation, Aquatic Therapy, Dry Needling, Electrical stimulation, Spinal manipulation, Spinal mobilization, Cryotherapy, Moist heat, Manual therapy, and Re-evaluation   PLAN FOR NEXT SESSION: Continue to progress load with lateral and front raises. Continue to strengthen parascapular musculature.    Ellin Goodie PT, DPT  [facility] [address], [place], [address] Phone: [phone]   Fax:  [phone]

## 2023-04-17 ENCOUNTER — Other Ambulatory Visit: Payer: Self-pay | Admitting: Internal Medicine

## 2023-04-17 ENCOUNTER — Other Ambulatory Visit: Payer: Self-pay

## 2023-04-20 ENCOUNTER — Other Ambulatory Visit: Payer: Self-pay

## 2023-04-20 ENCOUNTER — Other Ambulatory Visit: Payer: Self-pay | Admitting: Internal Medicine

## 2023-04-22 ENCOUNTER — Other Ambulatory Visit: Payer: Self-pay

## 2023-04-22 DIAGNOSIS — D225 Melanocytic nevi of trunk: Secondary | ICD-10-CM | POA: Diagnosis not present

## 2023-04-22 DIAGNOSIS — D2261 Melanocytic nevi of right upper limb, including shoulder: Secondary | ICD-10-CM | POA: Diagnosis not present

## 2023-04-22 DIAGNOSIS — L218 Other seborrheic dermatitis: Secondary | ICD-10-CM | POA: Diagnosis not present

## 2023-04-22 DIAGNOSIS — D2262 Melanocytic nevi of left upper limb, including shoulder: Secondary | ICD-10-CM | POA: Diagnosis not present

## 2023-04-22 DIAGNOSIS — D2272 Melanocytic nevi of left lower limb, including hip: Secondary | ICD-10-CM | POA: Diagnosis not present

## 2023-04-22 DIAGNOSIS — D2271 Melanocytic nevi of right lower limb, including hip: Secondary | ICD-10-CM | POA: Diagnosis not present

## 2023-04-22 DIAGNOSIS — L608 Other nail disorders: Secondary | ICD-10-CM | POA: Diagnosis not present

## 2023-04-22 MED ORDER — CLOBETASOL PROPIONATE 0.05 % EX LOTN
TOPICAL_LOTION | CUTANEOUS | 0 refills | Status: DC
  Filled 2023-04-22: qty 59, 30d supply, fill #0

## 2023-04-22 MED ORDER — CLOBETASOL PROPIONATE 0.05 % EX SOLN
CUTANEOUS | 1 refills | Status: DC
  Filled 2023-04-22: qty 50, 30d supply, fill #0
  Filled 2023-07-09: qty 50, 30d supply, fill #1

## 2023-04-23 ENCOUNTER — Encounter: Payer: Self-pay | Admitting: Internal Medicine

## 2023-04-23 ENCOUNTER — Other Ambulatory Visit: Payer: Self-pay

## 2023-04-23 ENCOUNTER — Ambulatory Visit: Payer: 59 | Admitting: Physical Therapy

## 2023-04-23 DIAGNOSIS — M2550 Pain in unspecified joint: Secondary | ICD-10-CM

## 2023-04-23 DIAGNOSIS — E2839 Other primary ovarian failure: Secondary | ICD-10-CM

## 2023-04-24 ENCOUNTER — Other Ambulatory Visit: Payer: Self-pay

## 2023-04-24 MED FILL — Clobetasol Propionate Soln 0.05%: CUTANEOUS | 30 days supply | Qty: 50 | Fill #0 | Status: CN

## 2023-04-24 NOTE — Telephone Encounter (Signed)
Usually generally start screening at 58, but with her surgeries and if concern - ok to order bone density  (dx estrogen deficiency)

## 2023-04-24 NOTE — Telephone Encounter (Signed)
Ok to refer to rheumatologist of her choice.

## 2023-04-30 ENCOUNTER — Telehealth: Payer: Self-pay | Admitting: Physical Therapy

## 2023-04-30 ENCOUNTER — Encounter: Payer: 59 | Admitting: Physical Therapy

## 2023-04-30 NOTE — Telephone Encounter (Signed)
Pt is still awaiting authorization from insurance for more visits, thus today's apt was cancelled.

## 2023-05-04 ENCOUNTER — Ambulatory Visit: Payer: 59 | Admitting: Physical Therapy

## 2023-05-05 ENCOUNTER — Encounter: Payer: 59 | Admitting: Physical Therapy

## 2023-05-06 ENCOUNTER — Ambulatory Visit: Payer: 59 | Admitting: Physical Therapy

## 2023-05-07 ENCOUNTER — Encounter: Payer: 59 | Admitting: Physical Therapy

## 2023-05-11 ENCOUNTER — Other Ambulatory Visit: Payer: Self-pay

## 2023-05-11 ENCOUNTER — Other Ambulatory Visit: Payer: Self-pay | Admitting: Internal Medicine

## 2023-05-11 ENCOUNTER — Ambulatory Visit: Payer: 59 | Admitting: Physical Therapy

## 2023-05-12 ENCOUNTER — Other Ambulatory Visit: Payer: Self-pay

## 2023-05-12 MED ORDER — TELMISARTAN 40 MG PO TABS
40.0000 mg | ORAL_TABLET | Freq: Every day | ORAL | 1 refills | Status: DC
  Filled 2023-05-12: qty 90, 90d supply, fill #0
  Filled 2023-07-09: qty 90, 90d supply, fill #1

## 2023-05-13 ENCOUNTER — Ambulatory Visit: Payer: 59 | Admitting: Physical Therapy

## 2023-05-13 DIAGNOSIS — M2540 Effusion, unspecified joint: Secondary | ICD-10-CM | POA: Diagnosis not present

## 2023-05-13 DIAGNOSIS — M1991 Primary osteoarthritis, unspecified site: Secondary | ICD-10-CM | POA: Diagnosis not present

## 2023-05-13 DIAGNOSIS — R5383 Other fatigue: Secondary | ICD-10-CM | POA: Diagnosis not present

## 2023-05-13 DIAGNOSIS — M79642 Pain in left hand: Secondary | ICD-10-CM | POA: Diagnosis not present

## 2023-05-13 DIAGNOSIS — Z6820 Body mass index (BMI) 20.0-20.9, adult: Secondary | ICD-10-CM | POA: Diagnosis not present

## 2023-05-13 DIAGNOSIS — M79641 Pain in right hand: Secondary | ICD-10-CM | POA: Diagnosis not present

## 2023-05-18 ENCOUNTER — Ambulatory Visit: Payer: 59 | Admitting: Physical Therapy

## 2023-05-20 ENCOUNTER — Other Ambulatory Visit: Payer: Self-pay

## 2023-05-20 MED FILL — Clobetasol Propionate Soln 0.05%: CUTANEOUS | 50 days supply | Qty: 50 | Fill #0 | Status: AC

## 2023-05-21 ENCOUNTER — Encounter: Payer: 59 | Admitting: Physical Therapy

## 2023-05-22 ENCOUNTER — Ambulatory Visit (INDEPENDENT_AMBULATORY_CARE_PROVIDER_SITE_OTHER): Payer: 59

## 2023-05-22 ENCOUNTER — Encounter: Payer: Self-pay | Admitting: Podiatry

## 2023-05-22 ENCOUNTER — Ambulatory Visit (INDEPENDENT_AMBULATORY_CARE_PROVIDER_SITE_OTHER): Payer: 59 | Admitting: Podiatry

## 2023-05-22 DIAGNOSIS — M2011 Hallux valgus (acquired), right foot: Secondary | ICD-10-CM | POA: Diagnosis not present

## 2023-05-22 DIAGNOSIS — M2012 Hallux valgus (acquired), left foot: Secondary | ICD-10-CM | POA: Diagnosis not present

## 2023-05-22 DIAGNOSIS — M2141 Flat foot [pes planus] (acquired), right foot: Secondary | ICD-10-CM

## 2023-05-22 DIAGNOSIS — M2142 Flat foot [pes planus] (acquired), left foot: Secondary | ICD-10-CM

## 2023-05-22 NOTE — Patient Instructions (Addendum)
Darco toe splint.  Available on [employer]

## 2023-05-22 NOTE — Progress Notes (Unsigned)
Chief Complaint  Patient presents with   Foot Pain    "I have bunions." N - painful bunions L - bilateral D - left - 25 yrs. Old, right - 10 yrs; right >left O - gradually worse  C - big toe is crossing over the left, causes discomfort A - shoes, walking, standing, pressure T - wear tennis shoes, silicone toe separators, bunion splint    Subjective: 62 y.o. female presents today as a new patient for evaluation of symptomatic bunions to bilateral feet.  Patient states that she has had bunions for several years.  Family history of bunions.  She has tried different splinting and shoe gear modifications.  Presenting for further treatment and evaluation  Past Medical History:  Diagnosis Date   Arthritis    Complication of anesthesia    pt states she is very sensitive to most drugs, usually needs 1/2 of whatever other people get. She states as a child/teenager she had difficulty waking up after surgery due to the sensitivity that she has to drugs.   Eczema    Frequent UTI    H/O lymphadenopathy    With previous submandibular node removals.   Hypertension    Recurrent sinus infections    Sleep apnea    uses c-pap    Past Surgical History:  Procedure Laterality Date   COLONOSCOPY WITH PROPOFOL N/A 08/25/2017   Procedure: COLONOSCOPY WITH PROPOFOL;  Surgeon: Midge Minium, MD;  Location: [facility] END[facility]COPY;  Service: Endoscopy;  Laterality: N/A;   COLONOSCOPY WITH PROPOFOL N/A 10/17/2022   Procedure: COLONOSCOPY WITH PROPOFOL;  Surgeon: Midge Minium, MD;  Location: [facility];  Service: Endoscopy;  Laterality: N/A;   LUMBAR FUSION  12/26/2019   l4-l5   Lymph Node Removal  1984   NECK SURGERY  1977   H/O Right neck surgery secondary to a benign growth with when patient was 30 or 62 yo   skin lesion extraction  2013   basal cell - Dr Cheree Ditto   TOTAL SHOULDER ARTHROPLASTY Left 12/18/2022   Procedure: TOTAL SHOULDER ARTHROPLASTY;  Surgeon: Bjorn Pippin, MD;  Location: [place] [facility];  Service: Orthopedics;  Laterality: Left;   Uterine Polyps Removed  2004    Allergies  Allergen Reactions   Diclofenac Sodium Other (See Comments)    Affected her liver function, so she can't take this.   Voltaren [Diclofenac Sodium]     Liver problem   Objective: Physical Exam General: The patient is alert and oriented x3 in no acute distress.  Dermatology: Skin is cool, dry and supple bilateral lower extremities. Negative for open lesions or macerations.  Vascular: Palpable pedal pulses bilaterally. No edema or erythema noted. Capillary refill within normal limits.  Neurological: Grossly intact via light touch  Musculoskeletal Exam: Clinical evidence of bunion deformity noted to the respective foot. There is moderate pain on palpation range of motion of the first MPJ. Lateral deviation of the hallux noted consistent with hallux abductovalgus.  Collapse of the medial longitudinal arch of the foot with weightbearing bilateral.  Consistent with pes planovalgus deformity  Radiographic Exam B/L feet 05/25/2023: Normal osseous mineralization.  No acute fractures identified.  Increased intermetatarsal angle greater than 15 with a hallux abductus angle greater than 30 noted on AP view. RT > LT. there is some collapse of the medial longitudinal arch of the foot especially noted on lateral view  Assessment: 1.  Hallux valgus bilateral.  RT > LT 2.  Pes planovalgus deformity bilateral  Plan of Care:  -Patient was evaluated. X-Rays reviewed. -Patient is not interested in any surgical correction of the moment.  She is looking for more conservative treatment modalities or active lifestyle. -Recommended Darco toe splint to wear nightly -Continue wearing wide fitting shoes that do not irritate or constrict the toebox area -Recommend arch supports.  Today we did discuss prefabricated versus custom molded orthotics.  She has tried prefabricated orthotics in the past with minimal  improvement.  She would like to pursue custom molded orthotics.  I do believe this would help support the medial longitudinal arch of the foot and slow down the progression of the bunion deformities and potentially alleviate pressure from the first MTP -The patient was molded for custom orthotics today.  Order placed. -Return to clinic for orthotics pickup  *Ultrasound specialist at [facility].  Felecia Shelling, DPM [facility]  Dr. Felecia Shelling, DPM    [address] [place], [place] [address]                Office [phone]  Fax [phone]

## 2023-05-26 ENCOUNTER — Other Ambulatory Visit: Payer: Self-pay

## 2023-05-26 DIAGNOSIS — M0600 Rheumatoid arthritis without rheumatoid factor, unspecified site: Secondary | ICD-10-CM | POA: Diagnosis not present

## 2023-05-26 DIAGNOSIS — M1991 Primary osteoarthritis, unspecified site: Secondary | ICD-10-CM | POA: Diagnosis not present

## 2023-05-26 DIAGNOSIS — Z6820 Body mass index (BMI) 20.0-20.9, adult: Secondary | ICD-10-CM | POA: Diagnosis not present

## 2023-05-26 DIAGNOSIS — M2540 Effusion, unspecified joint: Secondary | ICD-10-CM | POA: Diagnosis not present

## 2023-05-26 DIAGNOSIS — M79642 Pain in left hand: Secondary | ICD-10-CM | POA: Diagnosis not present

## 2023-05-26 DIAGNOSIS — M79641 Pain in right hand: Secondary | ICD-10-CM | POA: Diagnosis not present

## 2023-05-26 MED ORDER — HYDROXYCHLOROQUINE SULFATE 200 MG PO TABS
200.0000 mg | ORAL_TABLET | Freq: Every day | ORAL | 1 refills | Status: DC
  Filled 2023-05-26: qty 90, 90d supply, fill #0

## 2023-05-28 ENCOUNTER — Encounter: Payer: Self-pay | Admitting: Internal Medicine

## 2023-05-28 DIAGNOSIS — M0600 Rheumatoid arthritis without rheumatoid factor, unspecified site: Secondary | ICD-10-CM | POA: Insufficient documentation

## 2023-06-02 ENCOUNTER — Encounter: Payer: Self-pay | Admitting: Internal Medicine

## 2023-06-02 ENCOUNTER — Other Ambulatory Visit: Payer: Self-pay | Admitting: Internal Medicine

## 2023-06-02 DIAGNOSIS — Z136 Encounter for screening for cardiovascular disorders: Secondary | ICD-10-CM

## 2023-06-02 NOTE — Telephone Encounter (Signed)
Notify - I have placed the order for the calcium score (CT).  Insurance does not cover.  It is 99$ to do the test.  Also, I reviewed rheumatology note - regarding starting plaquenil.  Will need to continue regular eye exams.

## 2023-06-02 NOTE — Progress Notes (Signed)
Order placed for calcium score.

## 2023-06-04 ENCOUNTER — Other Ambulatory Visit: Payer: Self-pay

## 2023-06-04 DIAGNOSIS — H35341 Macular cyst, hole, or pseudohole, right eye: Secondary | ICD-10-CM | POA: Diagnosis not present

## 2023-06-04 DIAGNOSIS — H40059 Ocular hypertension, unspecified eye: Secondary | ICD-10-CM | POA: Diagnosis not present

## 2023-06-04 DIAGNOSIS — H40003 Preglaucoma, unspecified, bilateral: Secondary | ICD-10-CM | POA: Diagnosis not present

## 2023-06-11 ENCOUNTER — Ambulatory Visit
Admission: RE | Admit: 2023-06-11 | Discharge: 2023-06-11 | Disposition: A | Discharge status: Home / Self Care | Payer: Self-pay | Source: Ambulatory Visit | Attending: Internal Medicine | Admitting: Internal Medicine

## 2023-06-11 ENCOUNTER — Other Ambulatory Visit: Payer: Self-pay

## 2023-06-11 DIAGNOSIS — Z136 Encounter for screening for cardiovascular disorders: Secondary | ICD-10-CM | POA: Insufficient documentation

## 2023-06-16 ENCOUNTER — Telehealth: Payer: Self-pay | Admitting: Podiatry

## 2023-06-16 NOTE — Telephone Encounter (Signed)
Orthotics in..lvm for pt to call to schedule an appt to pick them up. She is a [facility] pt.Marland Kitchen

## 2023-06-17 ENCOUNTER — Telehealth: Payer: Self-pay | Admitting: Podiatry

## 2023-06-17 NOTE — Telephone Encounter (Signed)
Pts orthotics are in and she is a [place] appt. She is not availbale on 9.6 but next Monday or Tuesday. Could Dr Logan Bores dispense?

## 2023-06-18 NOTE — Telephone Encounter (Signed)
Yes for sure I will take them with me tomorrow

## 2023-06-23 ENCOUNTER — Encounter: Payer: Self-pay | Admitting: Podiatry

## 2023-06-23 ENCOUNTER — Ambulatory Visit (INDEPENDENT_AMBULATORY_CARE_PROVIDER_SITE_OTHER): Payer: 59 | Admitting: Podiatry

## 2023-06-23 DIAGNOSIS — M2011 Hallux valgus (acquired), right foot: Secondary | ICD-10-CM | POA: Diagnosis not present

## 2023-06-23 DIAGNOSIS — M2141 Flat foot [pes planus] (acquired), right foot: Secondary | ICD-10-CM

## 2023-06-23 DIAGNOSIS — M2142 Flat foot [pes planus] (acquired), left foot: Secondary | ICD-10-CM

## 2023-06-23 DIAGNOSIS — M2012 Hallux valgus (acquired), left foot: Secondary | ICD-10-CM | POA: Diagnosis not present

## 2023-06-23 DIAGNOSIS — H40003 Preglaucoma, unspecified, bilateral: Secondary | ICD-10-CM | POA: Diagnosis not present

## 2023-06-23 NOTE — Patient Instructions (Signed)

## 2023-06-23 NOTE — Progress Notes (Signed)
Chief Complaint  Patient presents with   Foot Orthotics    "We're fitting for orthotics."    Subjective: 62 y.o. female presents today to have her orthotics dispensed.    Brief history: Patient states that she has had bunions for several years.  Family history of bunions.  She has tried different splinting and shoe gear modifications.   Past Medical History:  Diagnosis Date   Arthritis    Complication of anesthesia    pt states she is very sensitive to most drugs, usually needs 1/2 of whatever other people get. She states as a child/teenager she had difficulty waking up after surgery due to the sensitivity that she has to drugs.   Eczema    Frequent UTI    H/O lymphadenopathy    With previous submandibular node removals.   Hypertension    Recurrent sinus infections    Sleep apnea    uses c-pap    Past Surgical History:  Procedure Laterality Date   COLONOSCOPY WITH PROPOFOL N/A 08/25/2017   Procedure: COLONOSCOPY WITH PROPOFOL;  Surgeon: Midge Minium, MD;  Location: [facility];  Service: Endoscopy;  Laterality: N/A;   COLONOSCOPY WITH PROPOFOL N/A 10/17/2022   Procedure: COLONOSCOPY WITH PROPOFOL;  Surgeon: Midge Minium, MD;  Location: [facility];  Service: Endoscopy;  Laterality: N/A;   LUMBAR FUSION  12/26/2019   l4-l5   Lymph Node Removal  1984   NECK SURGERY  1977   H/O Right neck surgery secondary to a benign growth with when patient was 55 or 62 yo   skin lesion extraction  2013   basal cell - Dr Cheree Ditto   TOTAL SHOULDER ARTHROPLASTY Left 12/18/2022   Procedure: TOTAL SHOULDER ARTHROPLASTY;  Surgeon: Bjorn Pippin, MD;  Location: [facility];  Service: Orthopedics;  Laterality: Left;   Uterine Polyps Removed  2004    Allergies  Allergen Reactions   Diclofenac Sodium Other (See Comments)    Affected her liver function, so she can't take this.   Voltaren [Diclofenac Sodium]     Liver problem   Objective: Physical Exam General: The  patient is alert and oriented x3 in no acute distress.  Dermatology: Skin is cool, dry and supple bilateral lower extremities. Negative for open lesions or macerations.  Vascular: Palpable pedal pulses bilaterally. No edema or erythema noted. Capillary refill within normal limits.  Neurological: Grossly intact via light touch  Musculoskeletal Exam: Unchanged.  Clinical evidence of bunion deformity noted to the respective foot. There is moderate pain on palpation range of motion of the first MPJ. Lateral deviation of the hallux noted consistent with hallux abductovalgus.  Collapse of the medial longitudinal arch of the foot with weightbearing bilateral.  Consistent with pes planovalgus deformity  Radiographic Exam B/L feet 05/25/2023: Normal osseous mineralization.  No acute fractures identified.  Increased intermetatarsal angle greater than 15 with a hallux abductus angle greater than 30 noted on AP view. RT > LT. there is some collapse of the medial longitudinal arch of the foot especially noted on lateral view  Assessment: 1.  Hallux valgus bilateral.  RT > LT 2.  Pes planovalgus deformity bilateral  Plan of Care:  -Patient was evaluated.  -Orthotics dispensed.  Patient found very comfortable. 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  Felecia Shelling, DPM [facility]  Dr. Larena Glassman.  Logan Bores, DPM    [address] [place], [place] [address]                Office [phone]  Fax [phone]

## 2023-06-25 ENCOUNTER — Encounter: Payer: Self-pay | Admitting: Internal Medicine

## 2023-06-26 ENCOUNTER — Ambulatory Visit (INDEPENDENT_AMBULATORY_CARE_PROVIDER_SITE_OTHER): Payer: 59 | Admitting: Nurse Practitioner

## 2023-06-26 ENCOUNTER — Encounter: Payer: Self-pay | Admitting: Nurse Practitioner

## 2023-06-26 VITALS — BP 122/84 | HR 81 | Temp 97.8°F | Ht 64.0 in | Wt 120.6 lb

## 2023-06-26 DIAGNOSIS — G4733 Obstructive sleep apnea (adult) (pediatric): Secondary | ICD-10-CM | POA: Diagnosis not present

## 2023-06-26 NOTE — Assessment & Plan Note (Addendum)
Mild OSA on CPAP. Understands risks of untreated OSA. Good compliance and excellent control. Receives benefit from use. Needs a new CPAP machine. We will also try changing her to a DreamWear full face to see if this helps with leaks when she sleeps on her side. Understands proper use/care of device. Aware of safe driving practices.   Patient Instructions  Continue to use CPAP every night, minimum of 4-6 hours a night.  Change equipment as directed. Wash your tubing with warm soap and water daily, hang to dry. Wash humidifier portion weekly. Use bottled, distilled water and change daily  Be aware of reduced alertness and do not drive or operate heavy machinery if experiencing this or drowsiness.  Notify if persistent daytime sleepiness occurs even with consistent use of CPAP.  We will send orders for new CPAP machine Switch to DreamWear full face mask   Follow up in 12 weeks with one of our NPs to see how CPAP is working. If symptoms worsen, please contact office for sooner follow up

## 2023-06-26 NOTE — Patient Instructions (Addendum)
Continue to use CPAP every night, minimum of 4-6 hours a night.  Change equipment as directed. Wash your tubing with warm soap and water daily, hang to dry. Wash humidifier portion weekly. Use bottled, distilled water and change daily  Be aware of reduced alertness and do not drive or operate heavy machinery if experiencing this or drowsiness.  Notify if persistent daytime sleepiness occurs even with consistent use of CPAP.  We will send orders for new CPAP machine Switch to DreamWear full face mask   Follow up in 12 weeks with one of our NPs to see how CPAP is working. If symptoms worsen, please contact office for sooner follow up

## 2023-06-26 NOTE — Progress Notes (Signed)
@Patient  ID: Sara Perry, female    DOB: Jan 22, 1961, 62 y.o.   MRN: [id number]  Chief Complaint  Patient presents with   Follow-up    Cpap is good. Pressure is good. Seal on the mask does not do well.     Referring provider: Dale Plymouth, MD  HPI: 62 year old female, never smoker followed for mild OSA on CPAP. Past medical history significant for HTN, Meniere's disease, seronegative RA, HLD.   TEST/EVENTS:  07/02/2018 HST: 7.9/h, SpO2 89%  01/30/2023: OV with Walsh,NP. Re-establish for OSA/CPAP management. HST in 2019 showed mild OSA, AHI 6/h. Reports clinical improvement with CPAP use. Pressure settings 5-15 cmH2O. Not wearing CPAP regularly since March due to shoulder surgery. Typically a side sleeper. Sleep with sling in recliner. Back to sleeping in her bed. Has worn CPAP last 4 nights. Machine is in working order. Masks breaks her skin out. Tried memory foam mask and liners. Nasal pillows do not work well since she's a mouth breather. DME is [employer]. Residual AHI 1.4/h. lower pressure settings to see if it helps with leaks 5-13 cmH2O.  06/26/2023: Today - follow up Patient presents today for follow up. She has been doing well with her CPAP. Sleeps with it most nights. She feels better rested when she sleeps with her CPAP. Without it, she has very loud snoring and is fatigued the next day. She would like to see if she could try a different mask. Sleeps with her mouth open with nasal mask. Denies drowsy driving, sleep parasomnias/paralysis.   05/26/2023-06/24/2023: CPAP 5-15 cmH2O 27/30 days; 83% >4 hr; average use 6 hr 59 min Pressure 95th 10.3 Leaks 95th 21.2 AHI 1.4   Allergies  Allergen Reactions   Diclofenac Sodium Other (See Comments)    Affected her liver function, so she can't take this.   Voltaren [Diclofenac Sodium]     Liver problem    Immunization History  Administered Date(s) Administered   Influenza Split 07/10/2014   Influenza,inj,Quad PF,6+ Mos  07/13/2022   Influenza-Unspecified 07/18/2015, 06/23/2017, 06/20/2018, 07/20/2020, 07/13/2021   PFIZER(Purple Top)SARS-COV-2 Vaccination 12/09/2019, 01/20/2020   Tdap 11/19/2017    Past Medical History:  Diagnosis Date   Arthritis    Complication of anesthesia    pt states she is very sensitive to most drugs, usually needs 1/2 of whatever other people get. She states as a child/teenager she had difficulty waking up after surgery due to the sensitivity that she has to drugs.   Eczema    Frequent UTI    H/O lymphadenopathy    With previous submandibular node removals.   Hypertension    Recurrent sinus infections    Sleep apnea    uses c-pap    Tobacco History: Social History   Tobacco Use  Smoking Status Never  Smokeless Tobacco Never   Counseling given: Not Answered   Outpatient Medications Prior to Visit  Medication Sig Dispense Refill   Acetaminophen Extra Strength 500 MG CAPS Take 2 tablets (1,000 mg) by mouth every 8 (eight) hours as needed. 84 capsule 0   aspirin 81 MG chewable tablet Chew 1 tablet (81 mg total) by mouth daily for 6 weeks for post-op DVT prophylaxis. 90 tablet 0   Blood Pressure Monitor MISC Check BP regularly. 1 each 0   Calcium Carb-Cholecalciferol (CALCIUM-VITAMIN D) 600-400 MG-UNIT TABS Take 1 tablet by mouth 3 (three) times a week.     celecoxib (CELEBREX) 100 MG capsule Take 1 capsule by mouth twice a day 60  capsule 2   Cholecalciferol 25 MCG (1000 UT) capsule Take by mouth.     clobetasol (TEMOVATE) 0.05 % external solution Apply 1 Application topically to scalp daily as needed for flares. 50 mL 1   estradiol (ESTRACE VAGINAL) 0.1 MG/GM vaginal cream Use 1 application at night for 5 nights and then use 2 times per week (Patient taking differently: Use 1 application once a week) 42.5 g 1   fluocinonide (LIDEX) 0.05 % external solution APPLY TO AFFECTED AREA(S) AT BEDTIME (Patient taking differently: Apply 1 application  topically See admin  instructions. Use once or twice a week) 60 mL 0   Glucosamine-Chondroit-Vit C-Mn (GLUCOSAMINE-CHONDROITIN) CAPS Take 1 capsule by mouth daily.     hydroxychloroquine (PLAQUENIL) 200 MG tablet Take 1 tablet (200 mg total) by mouth daily. 90 tablet 1   Multiple Vitamin (MULTIVITAMIN) tablet Take 1 tablet by mouth daily. Centrum Silver 50+     Omega-3 Fatty Acids (FISH OIL) 1000 MG CAPS Take 1 capsule by mouth daily.     telmisartan (MICARDIS) 40 MG tablet Take 1 tablet (40 mg total) by mouth daily. 90 tablet 1   tretinoin (RETIN-A) 0.05 % cream Apply 1 application topically every 30 (thirty) days. Apply to affected area as needed for Acne     clobetasol (TEMOVATE) 0.05 % external solution Apply to affected scalp daily as needed for flares (Patient not taking: Reported on 06/26/2023) 50 mL 1   clobetasol (TEMOVATE) 0.05 % external solution Apply 1 Application topically to scalp daily as needed for flares. (Patient not taking: Reported on 06/26/2023) 50 mL 1   clobetasol (TEMOVATE) 0.05 % external solution Apply to affected scalp daily as needed for flares (Patient not taking: Reported on 06/26/2023) 50 mL 1   Clobetasol Propionate 0.05 % lotion Apply to ears twice daily for flaking. (Patient not taking: Reported on 06/26/2023) 59 mL 0   Turmeric 500 MG CAPS Take 1 tablet by mouth as needed.     No facility-administered medications prior to visit.     Review of Systems:   Constitutional: No weight loss or gain, night sweats, fevers, chills, fatigue or lassitude.  HEENT: No headaches, difficulty swallowing, tooth/dental problems, or sore throat. No sneezing, itching, ear ache, nasal congestion, or post nasal drip CV:  No chest pain, orthopnea, PND, swelling in lower extremities, anasarca, dizziness, palpitations, syncope Resp: No shortness of breath with exertion or at rest. No excess mucus or change in color of mucus. No productive or non-productive. No hemoptysis. No wheezing.  No chest wall  deformity GI:  No heartburn, indigestion Skin: No rash, lesions, ulcerations Neuro: No dizziness or lightheadedness.  Psych: No depression or anxiety. Mood stable.     Physical Exam:  BP 122/84 (BP Location: Right Arm, Cuff Size: Normal)   Pulse 81   Temp 97.8 F (36.6 C)   Ht 5\' 4"  (1.626 m)   Wt 120 lb 9.6 oz (54.7 kg)   LMP 12/01/2008   SpO2 97%   BMI 20.70 kg/m   GEN: Pleasant, interactive, well-appearing; in no acute distress. HEENT:  Normocephalic and atraumatic. PERRLA. Sclera white. Nasal turbinates pink, moist and patent bilaterally. No rhinorrhea present. Oropharynx pink and moist, without exudate or edema. No lesions, ulcerations, or postnasal drip. Mallampati IV NECK:  Supple w/ fair ROM. No JVD present. Normal carotid impulses w/o bruits. Thyroid symmetrical with no goiter or nodules palpated. No lymphadenopathy.   CV: RRR, no m/r/g, no peripheral edema. Pulses intact, +2 bilaterally. No cyanosis,  pallor or clubbing. PULMONARY:  Unlabored, regular breathing. Clear bilaterally A&P w/o wheezes/rales/rhonchi. No accessory muscle use.  GI: BS present and normoactive. Soft, non-tender to palpation. No organomegaly or masses detected. MSK: No erythema, warmth or tenderness. Cap refil <2 sec all extrem. No deformities or joint swelling noted.  Neuro: A/Ox3. No focal deficits noted.   Skin: Warm, no lesions or rashe Psych: Normal affect and behavior. Judgement and thought content appropriate.     Lab Results:  CBC    Component Value Date/Time   WBC 4.7 09/30/2022 0851   RBC 3.95 09/30/2022 0851   HGB 13.0 09/30/2022 0851   HGB 13.4 12/04/2012 0928   HCT 38.4 09/30/2022 0851   HCT 40.3 12/04/2012 0928   PLT 345.0 09/30/2022 0851   PLT 260 12/04/2012 0928   MCV 97.2 09/30/2022 0851   MCV 98 12/04/2012 0928   MCH 32.0 12/26/2019 0939   MCHC 33.8 09/30/2022 0851   RDW 13.2 09/30/2022 0851   RDW 12.7 12/04/2012 0928   LYMPHSABS 1.5 09/30/2022 0851   LYMPHSABS 1.3  12/04/2012 0928   MONOABS 0.4 09/30/2022 0851   MONOABS 0.3 12/04/2012 0928   EOSABS 0.1 09/30/2022 0851   EOSABS 0.1 12/04/2012 0928   BASOSABS 0.0 09/30/2022 0851   BASOSABS 0.0 12/04/2012 0928    BMET    Component Value Date/Time   NA 136 03/13/2023 0959   NA 142 10/16/2022 0000   K 4.4 03/13/2023 0959   CL 100 03/13/2023 0959   CO2 31 03/13/2023 0959   GLUCOSE 85 03/13/2023 0959   BUN 16 03/13/2023 0959   BUN 15 10/16/2022 0000   CREATININE 0.74 03/13/2023 0959   CALCIUM 9.6 03/13/2023 0959   GFRNONAA >60 12/26/2019 0939   GFRAA >60 12/26/2019 0939    BNP No results found for: "BNP"   Imaging:  CT CARDIAC SCORING  Addendum Date: 06/20/2023   ADDENDUM REPORT: 06/20/2023 19:24 EXAM: OVER-READ INTERPRETATION  CT CHEST The following report is an over-read performed by radiologist Dr. Curly Shores [facility] on 06/20/2023. This over-read does not include interpretation of cardiac or coronary anatomy or pathology. The coronary calcium score interpretation by the cardiologist is attached. COMPARISON:  None. FINDINGS: Cardiovascular:  See findings discussed in the body of the report. Mediastinum/Nodes: No suspicious adenopathy identified. Imaged mediastinal structures are unremarkable. Lungs/Pleura: Imaged lungs are clear. No pleural effusion or pneumothorax. Upper Abdomen: No acute abnormality. Musculoskeletal: No chest wall abnormality. No acute or significant osseous findings. IMPRESSION: No significant extracardiac incidental findings identified. Electronically Signed   By: Layla Maw M.D.   On: 06/20/2023 19:24   Result Date: 06/20/2023 CLINICAL DATA:  Risk stratification EXAM: Coronary Calcium Score TECHNIQUE: The patient was scanned on a Siemens Somatom scanner. Axial non-contrast 3 mm slices were carried out through the heart. The data set was analyzed on a dedicated work station and scored using the Agatson method. FINDINGS: Non-cardiac: See separate  report from [facility]. Ascending Aorta: Normal size Pericardium: Normal Coronary arteries: Normal origin of left and right coronary arteries. Distribution of arterial calcifications if present, as noted below; LM 0 LAD 0 LCx 0 RCA 0 Total 0 IMPRESSION AND RECOMMENDATION: 1. Coronary calcium score of 0. 2. CAC 0, CAC-DRS A0. 3. Continue heart healthy lifestyle and risk factor modification. Electronically Signed: By: Debbe Odea M.D. On: 06/12/2023 09:43    Administration History     None           No data to display  No results found for: "NITRICOXIDE"      Assessment & Plan:   Sleep apnea Mild OSA on CPAP. Understands risks of untreated OSA. Good compliance and excellent control. Receives benefit from use. Needs a new CPAP machine. We will also try changing her to a DreamWear full face to see if this helps with leaks when she sleeps on her side. Understands proper use/care of device. Aware of safe driving practices.   Patient Instructions  Continue to use CPAP every night, minimum of 4-6 hours a night.  Change equipment as directed. Wash your tubing with warm soap and water daily, hang to dry. Wash humidifier portion weekly. Use bottled, distilled water and change daily  Be aware of reduced alertness and do not drive or operate heavy machinery if experiencing this or drowsiness.  Notify if persistent daytime sleepiness occurs even with consistent use of CPAP.  We will send orders for new CPAP machine Switch to DreamWear full face mask   Follow up in 12 weeks with one of our NPs to see how CPAP is working. If symptoms worsen, please contact office for sooner follow up    I spent 35 minutes of dedicated to the care of this patient on the date of this encounter to include pre-visit review of records, face-to-face time with the patient discussing conditions above, post visit ordering of testing, clinical documentation with the electronic health record,  making appropriate referrals as documented, and communicating necessary findings to members of the patients care team.  Noemi Chapel, NP 06/26/2023  Pt aware and understands NP's role.

## 2023-07-06 ENCOUNTER — Other Ambulatory Visit: Payer: 59

## 2023-07-09 ENCOUNTER — Other Ambulatory Visit: Payer: Self-pay

## 2023-07-09 ENCOUNTER — Ambulatory Visit
Admission: RE | Admit: 2023-07-09 | Discharge: 2023-07-09 | Disposition: A | Discharge status: Home / Self Care | Payer: 59 | Source: Ambulatory Visit | Attending: Internal Medicine | Admitting: Internal Medicine

## 2023-07-09 ENCOUNTER — Other Ambulatory Visit: Payer: Self-pay | Admitting: Internal Medicine

## 2023-07-09 DIAGNOSIS — E2839 Other primary ovarian failure: Secondary | ICD-10-CM | POA: Insufficient documentation

## 2023-07-09 DIAGNOSIS — M810 Age-related osteoporosis without current pathological fracture: Secondary | ICD-10-CM | POA: Diagnosis not present

## 2023-07-09 DIAGNOSIS — Z780 Asymptomatic menopausal state: Secondary | ICD-10-CM | POA: Diagnosis not present

## 2023-07-10 ENCOUNTER — Other Ambulatory Visit: Payer: Self-pay | Admitting: Internal Medicine

## 2023-07-10 ENCOUNTER — Encounter: Payer: Self-pay | Admitting: *Deleted

## 2023-07-10 ENCOUNTER — Other Ambulatory Visit: Payer: Self-pay

## 2023-07-10 MED FILL — Estradiol Vaginal Cream 0.01%: VAGINAL | 90 days supply | Qty: 42.5 | Fill #0 | Status: AC

## 2023-07-16 ENCOUNTER — Ambulatory Visit: Payer: 59 | Admitting: Internal Medicine

## 2023-07-16 ENCOUNTER — Other Ambulatory Visit: Payer: Self-pay

## 2023-07-16 VITALS — BP 120/72 | HR 78 | Temp 97.8°F | Resp 16 | Ht 63.0 in | Wt 121.6 lb

## 2023-07-16 DIAGNOSIS — G4733 Obstructive sleep apnea (adult) (pediatric): Secondary | ICD-10-CM | POA: Diagnosis not present

## 2023-07-16 DIAGNOSIS — E7800 Pure hypercholesterolemia, unspecified: Secondary | ICD-10-CM

## 2023-07-16 DIAGNOSIS — M810 Age-related osteoporosis without current pathological fracture: Secondary | ICD-10-CM | POA: Insufficient documentation

## 2023-07-16 DIAGNOSIS — M0600 Rheumatoid arthritis without rheumatoid factor, unspecified site: Secondary | ICD-10-CM | POA: Diagnosis not present

## 2023-07-16 DIAGNOSIS — I10 Essential (primary) hypertension: Secondary | ICD-10-CM

## 2023-07-16 LAB — BASIC METABOLIC PANEL
BUN: 15 mg/dL (ref 6–23)
CO2: 29 meq/L (ref 19–32)
Calcium: 9.8 mg/dL (ref 8.4–10.5)
Chloride: 105 meq/L (ref 96–112)
Creatinine, Ser: 0.75 mg/dL (ref 0.40–1.20)
GFR: 85.63 mL/min (ref >60.00)
Glucose, Bld: 87 mg/dL (ref 70–99)
Potassium: 4.1 meq/L (ref 3.5–5.1)
Sodium: 140 meq/L (ref 135–145)

## 2023-07-16 LAB — LIPID PANEL
Cholesterol: 205 mg/dL — ABNORMAL HIGH (ref 0–200)
HDL: 71.6 mg/dL (ref >39.00)
LDL Cholesterol: 111 mg/dL — ABNORMAL HIGH (ref 0–99)
NonHDL: 133.71
Total CHOL/HDL Ratio: 3
Triglycerides: 114 mg/dL (ref 0.0–149.0)
VLDL: 22.8 mg/dL (ref 0.0–40.0)

## 2023-07-16 LAB — CBC WITH DIFFERENTIAL/PLATELET
Basophils Absolute: 0 10*3/uL (ref 0.0–0.1)
Basophils Relative: 0.3 % (ref 0.0–3.0)
Eosinophils Absolute: 0.1 10*3/uL (ref 0.0–0.7)
Eosinophils Relative: 1.9 % (ref 0.0–5.0)
HCT: 38.5 % (ref 36.0–46.0)
Hemoglobin: 12.5 g/dL (ref 12.0–15.0)
Lymphocytes Relative: 32.7 % (ref 12.0–46.0)
Lymphs Abs: 1.5 10*3/uL (ref 0.7–4.0)
MCHC: 32.5 g/dL (ref 30.0–36.0)
MCV: 97.5 fL (ref 78.0–100.0)
Monocytes Absolute: 0.4 10*3/uL (ref 0.1–1.0)
Monocytes Relative: 9.3 % (ref 3.0–12.0)
Neutro Abs: 2.6 10*3/uL (ref 1.4–7.7)
Neutrophils Relative %: 55.8 % (ref 43.0–77.0)
Platelets: 306 10*3/uL (ref 150.0–400.0)
RBC: 3.94 Mil/uL (ref 3.87–5.11)
RDW: 12.9 % (ref 11.5–15.5)
WBC: 4.7 10*3/uL (ref 4.0–10.5)

## 2023-07-16 LAB — HEPATIC FUNCTION PANEL
ALT: 23 U/L (ref 0–35)
AST: 25 U/L (ref 0–37)
Albumin: 4.5 g/dL (ref 3.5–5.2)
Alkaline Phosphatase: 51 U/L (ref 39–117)
Bilirubin, Direct: 0.2 mg/dL (ref 0.0–0.3)
Total Bilirubin: 1.4 mg/dL — ABNORMAL HIGH (ref 0.2–1.2)
Total Protein: 6.8 g/dL (ref 6.0–8.3)

## 2023-07-16 LAB — VITAMIN D 25 HYDROXY (VIT D DEFICIENCY, FRACTURES): VITD: 41.43 ng/mL (ref 30.00–100.00)

## 2023-07-16 MED ORDER — TELMISARTAN 40 MG PO TABS
40.0000 mg | ORAL_TABLET | Freq: Every day | ORAL | 1 refills | Status: DC
  Filled 2023-07-16 – 2023-08-12 (×2): qty 90, 90d supply, fill #0
  Filled 2023-11-10: qty 90, 90d supply, fill #1

## 2023-07-16 NOTE — Progress Notes (Signed)
Subjective:    [name], female    DOB: 1961/01/15, 62 y.o.   MRN: [id number]  Patient here for a scheduled follow up.   HPI Here to follow up regarding hypercholesterolemia, sleep apnea and hypertension. Continues on micardis.  Is s/p left shoulder arthroplasty and PT. Saw Dr Logan Bores 05/22/23 - hallux valgus bilateral and pes planovalgus deformity (bilateral).  Recommended orthotics/toe splint. Saw rheumatology 05/2023 - seronegative RA. Suggested trial of hydroxychloroquine. She is taking and feels is helping with her hands. Has seen her eye MD. F/u with pulmonary 06/26/23 - compliant with cpap. Bone density - reveals osteoporosis. Discussed treatment options.  She will notify me of which medication she prefers to start.  Discussed taking calcium and vitamin D.    Past Medical History:  Diagnosis Date   Arthritis    Complication of anesthesia    pt states she is very sensitive to most drugs, usually needs 1/2 of whatever other people get. She states as a child/teenager she had difficulty waking up after surgery due to the sensitivity that she has to drugs.   Eczema    Frequent UTI    H/O lymphadenopathy    With previous submandibular node removals.   Hypertension    Recurrent sinus infections    Sleep apnea    uses c-pap   Past Surgical History:  Procedure Laterality Date   COLONOSCOPY WITH PROPOFOL N/A 08/25/2017   Procedure: COLONOSCOPY WITH PROPOFOL;  Surgeon: Midge Minium, MD;  Location: [facility];  Service: Endoscopy;  Laterality: N/A;   COLONOSCOPY WITH PROPOFOL N/A 10/17/2022   Procedure: COLONOSCOPY WITH PROPOFOL;  Surgeon: Midge Minium, MD;  Location: [facility];  Service: Endoscopy;  Laterality: N/A;   LUMBAR FUSION  12/26/2019   l4-l5   Lymph Node Removal  1984   NECK SURGERY  1977   H/O Right neck surgery secondary to a benign growth with when patient was 60 or 62 yo   skin lesion extraction  2013   basal cell - Dr Cheree Ditto   TOTAL  SHOULDER ARTHROPLASTY Left 12/18/2022   Procedure: TOTAL SHOULDER ARTHROPLASTY;  Surgeon: Bjorn Pippin, MD;  Location: [facility];  Service: Orthopedics;  Laterality: Left;   Uterine Polyps Removed  2004   Family History  Problem Relation Age of Onset   Hyperlipidemia Mother    Thyroid disease Mother    Heart disease Mother        Heart Valve problems   Coronary artery disease Father        Stents placed   Hypertension Other        uncle   Parkinson's disease Paternal Uncle    Parkinson's disease Maternal Grandmother    Heart disease Maternal Grandfather    Heart disease Paternal Grandfather        MI   Breast cancer Neg Hx    Social History   Socioeconomic History   Marital status: Divorced    Spouse name: Not on file   Number of children: 2   Years of education: Not on file   Highest education level: Not on file  Occupational History    Employer: [employer]  Tobacco Use   Smoking status: Never   Smokeless tobacco: Never  Vaping Use   Vaping status: Never Used  Substance and Sexual Activity   Alcohol use: Yes    Comment: Occasional   Drug use: No   Sexual activity: Not on file  Other Topics Concern  Not on file  Social History Narrative   Not on file   Social Determinants of Health   Financial Resource Strain: Not on file  Food Insecurity: Not on file  Transportation Needs: Not on file  Physical Activity: Not on file  Stress: Not on file  Social Connections: Not on file     Review of Systems  Constitutional:  Negative for appetite change and unexpected weight change.  HENT:  Negative for congestion and sinus pressure.   Respiratory:  Negative for cough, chest tightness and shortness of breath.   Cardiovascular:  Negative for chest pain, palpitations and leg swelling.  Gastrointestinal:  Negative for abdominal pain, diarrhea, nausea and vomiting.  Genitourinary:  Negative for difficulty urinating and dysuria.  Musculoskeletal:  Negative for  myalgias.       Hands some better.  Planning f/u with her surgeon regarding her back.    Skin:  Negative for color change and rash.  Neurological:  Negative for dizziness and headaches.  Psychiatric/Behavioral:  Negative for agitation and dysphoric mood.        Objective:     BP 120/72   Pulse 78   Temp 97.8 F (36.6 C)   Resp 16   Ht 5\' 3"  (1.6 m)   Wt 121 lb 9.6 oz (55.2 kg)   LMP 12/01/2008   SpO2 98%   BMI 21.54 kg/m  Wt Readings from Last 3 Encounters:  07/16/23 121 lb 9.6 oz (55.2 kg)  06/26/23 120 lb 9.6 oz (54.7 kg)  03/13/23 120 lb 6.4 oz (54.6 kg)    Physical Exam Vitals reviewed.  Constitutional:      General: She is not in acute distress.    Appearance: Normal appearance.  HENT:     Head: Normocephalic and atraumatic.     Right Ear: External ear normal.     Left Ear: External ear normal.  Eyes:     General: No scleral icterus.       Right eye: No discharge.        Left eye: No discharge.     Conjunctiva/sclera: Conjunctivae normal.  Neck:     Thyroid: No thyromegaly.  Cardiovascular:     Rate and Rhythm: Normal rate and regular rhythm.  Pulmonary:     Effort: No respiratory distress.     Breath sounds: Normal breath sounds. No wheezing.  Abdominal:     General: Bowel sounds are normal.     Palpations: Abdomen is soft.     Tenderness: There is no abdominal tenderness.  Musculoskeletal:        General: No swelling or tenderness.     Cervical back: Neck supple. No tenderness.  Lymphadenopathy:     Cervical: No cervical adenopathy.  Skin:    Findings: No erythema or rash.  Neurological:     Mental Status: She is alert.  Psychiatric:        Mood and Affect: Mood normal.        Behavior: Behavior normal.      Outpatient Encounter Medications as of 07/16/2023  Medication Sig   Acetaminophen Extra Strength 500 MG CAPS Take 2 tablets (1,000 mg) by mouth every 8 (eight) hours as needed.   Blood Pressure Monitor MISC Check BP regularly.    Calcium Carb-Cholecalciferol (CALCIUM-VITAMIN D) 600-400 MG-UNIT TABS Take 1 tablet by mouth 3 (three) times a week.   celecoxib (CELEBREX) 100 MG capsule Take 100 mg by mouth daily.   Cholecalciferol 25 MCG (1000 UT) capsule Take  by mouth.   clobetasol (TEMOVATE) 0.05 % external solution Apply 1 Application topically to scalp daily as needed for flares.   estradiol (ESTRACE) 0.1 MG/GM vaginal cream Use 1 application once a week   fluocinonide (LIDEX) 0.05 % external solution APPLY TO AFFECTED AREA(S) AT BEDTIME (Patient taking differently: Apply 1 application  topically See admin instructions. Use once or twice a week)   Glucosamine-Chondroit-Vit C-Mn (GLUCOSAMINE-CHONDROITIN) CAPS Take 1 capsule by mouth daily.   hydroxychloroquine (PLAQUENIL) 200 MG tablet Take 1 tablet (200 mg total) by mouth daily.   Multiple Vitamin (MULTIVITAMIN) tablet Take 1 tablet by mouth daily. Centrum Silver 50+   Omega-3 Fatty Acids (FISH OIL) 1000 MG CAPS Take 1 capsule by mouth daily.   telmisartan (MICARDIS) 40 MG tablet Take 1 tablet (40 mg total) by mouth daily.   tretinoin (RETIN-A) 0.05 % cream Apply 1 application topically every 30 (thirty) days. Apply to affected area as needed for Acne   Turmeric 500 MG CAPS Take 1 tablet by mouth as needed.   [DISCONTINUED] aspirin 81 MG chewable tablet Chew 1 tablet (81 mg total) by mouth daily for 6 weeks for post-op DVT prophylaxis.   [DISCONTINUED] celecoxib (CELEBREX) 100 MG capsule Take 1 capsule by mouth twice a day (Patient taking differently: Take 100 mg by mouth daily.)   [DISCONTINUED] clobetasol (TEMOVATE) 0.05 % external solution Apply to affected scalp daily as needed for flares (Patient not taking: Reported on 06/26/2023)   [DISCONTINUED] clobetasol (TEMOVATE) 0.05 % external solution Apply 1 Application topically to scalp daily as needed for flares. (Patient not taking: Reported on 06/26/2023)   [DISCONTINUED] clobetasol (TEMOVATE) 0.05 % external solution  Apply to affected scalp daily as needed for flares (Patient not taking: Reported on 06/26/2023)   [DISCONTINUED] Clobetasol Propionate 0.05 % lotion Apply to ears twice daily for flaking. (Patient not taking: Reported on 06/26/2023)   [DISCONTINUED] telmisartan (MICARDIS) 40 MG tablet Take 1 tablet (40 mg total) by mouth daily.   No facility-administered encounter medications on file as of 07/16/2023.     Lab Results  Component Value Date   WBC 4.7 07/16/2023   HGB 12.5 07/16/2023   HCT 38.5 07/16/2023   PLT 306.0 07/16/2023   GLUCOSE 87 07/16/2023   CHOL 205 (H) 07/16/2023   TRIG 114.0 07/16/2023   HDL 71.60 07/16/2023   LDLDIRECT 148.9 08/15/2013   LDLCALC 111 (H) 07/16/2023   ALT 23 07/16/2023   AST 25 07/16/2023   NA 140 07/16/2023   K 4.1 07/16/2023   CL 105 07/16/2023   CREATININE 0.75 07/16/2023   BUN 15 07/16/2023   CO2 29 07/16/2023   TSH 1.36 03/13/2023    DG Bone Density  Result Date: 07/09/2023 EXAM: DUAL X-RAY ABSORPTIOMETRY (DXA) FOR BONE MINERAL DENSITY IMPRESSION: Patient:             Guila, Owensby Birth Date:          1961-09-22    [age] Height / Weight:     63.0 in.    122.0 lbs. Sex / Ethnic:        Female    White Facility ID: Referring Physician: Dale Baker Measured:            07/09/2023 2:49:04 PM (14.10) Analyzed:            07/09/2023 3:02:48 PM (14.10) DualFemur Bone Density Trend BMD     Young-Adult Age-Matched Region       (g/cm2) T-score  Z-score     WHO Classification Neck Left       0.804   -1.7        -0.4        Osteopenia Right      0.753   -2.0        -0.7        Osteopenia Mean       0.779   -1.9        -0.6        Osteopenia Difference 0.051   -0.4        -0.4         - Total Left       0.843   -1.3        -0.3        Osteopenia Right      0.778   -1.8        -0.8        Osteopenia Mean       0.810   -1.6        -0.5        Osteopenia Difference 0.064   -0.5        -0.5         - Hip Axis Length Comparison (mm) (Right = 98.2 mm)  (Mean  = 104.0 mm)  (Left = 97.2 mm) Trend: Neck Right Change vs Change vs Measured   Age     BMD     Previous  Previous Date       (years) (g/cm2) (g/cm2)   (%) 07/09/2023 [age]    0.753   -0.189*   -20.1* 12/02/2005 44.2    0.942   -         - Your patient Talishia Betzler completed a BMD test on 07/09/2023 using the [employer] DXA System (software version: 14.10) manufactured by [employer]. The following summarizes the results of our evaluation. Technologist:VLM PATIENT BIOGRAPHICAL: Name: Danyetta, Gillham [name]: [id number] Birth Date: 1961-06-05 Height: 63.0 in. Gender: Female Exam Date: 07/09/2023 Weight: 122.0 lbs. Indications: Caucasian, family hx hip fracture, Postmenopausal, Rheumatoid Arthritis Fractures: Treatments: calcium w/ vit D, Estradiol, hydrochloroquine DENSITOMETRY RESULTS: Site         Region     Measured Date Measured Age WHO Classification Young Adult T-score BMD         %Change vs. Previous Significant Change (*) AP Spine L1-L2 07/09/2023 61.8 Osteoporosis -3.6 0.742 g/cm2 -28.2% Yes AP Spine L1-L2 12/02/2005 44.2 Osteopenia -1.2 1.034 g/cm2 - - Left Forearm Radius 33% 07/09/2023 61.8 Osteoporosis -4.0 0.526 g/cm2 - - DualFemur Neck Right 07/09/2023 61.8 Osteopenia -2.0 0.753 g/cm2 -20.1% Yes DualFemur Neck Right 12/02/2005 44.2 Normal -0.7 0.942 g/cm2 - - ASSESSMENT: The BMD measured at Forearm Radius 33% is 0.526 g/cm2 with a T-score of -4.0. This patient is considered osteoporotic according to World Health Organization [facility]) criteria. L-3 & L-4 were excluded due to surgical hardware. Compared with prior study, there has been significant decrease in the spine. Compared with prior study, there has been significant decrease in the total hip. The scan quality is good. World Science writer [facility]) criteria for post-menopausal, Caucasian Women: Normal:                   T-score at or above -1 SD Osteopenia/low bone mass: T-score between -1 and -2.5 SD Osteoporosis:             T-score  at or below -2.5 SD RECOMMENDATIONS:  1. All patients should optimize calcium and vitamin D intake. 2. Consider FDA-approved medical therapies in postmenopausal women and men aged 21 years and older, based on the following: a. A hip or vertebral(clinical or morphometric) fracture b. T-score < -2.5 at the femoral neck or spine after appropriate evaluation to exclude secondary causes c. Low bone mass (T-score between -1.0 and -2.5 at the femoral neck or spine) and a 10-year probability of a hip fracture > 3% or a 10-year probability of a major osteoporosis-related fracture > 20% based on the [place]-adapted WHO algorithm 3. Clinician judgment and/or patient preferences may indicate treatment for people with 10-year fracture probabilities above or below these levels FOLLOW-UP: People with diagnosed cases of osteoporosis or at high risk for fracture should have regular bone mineral density tests. For patients eligible for Medicare, routine testing is allowed once every 2 years. The testing frequency can be increased to one year for patients who have rapidly progressing disease, those who are receiving or discontinuing medical therapy to restore bone mass, or have additional risk factors. I have reviewed this report, and agree with the above findings. [facility] Electronically Signed   By: Baird Lyons M.D.   On: 07/09/2023 15:12       Assessment & Plan:  Hypercholesterolemia Assessment & Plan: The 10-year ASCVD risk score (Arnett DK, et al., 2019) is: 3.7%   Values used to calculate the score:     Age: 14 years     Sex: Female     Is Non-Hispanic African American: No     Diabetic: No     Tobacco smoker: No     Systolic Blood Pressure: 120 mmHg     Is BP treated: Yes     HDL Cholesterol: 71.6 mg/dL     Total Cholesterol: 205 mg/dL  Low cholesterol diet and exercise.  Follow lipid panel.   Orders: -     Lipid panel -     Hepatic function panel -     Basic metabolic panel  Essential  hypertension, benign Assessment & Plan: On micardis 40mg  q day.  Blood pressures as outlined.  Follow pressures.  Follow metabolic panel.   Orders: -     CBC with Differential/Platelet  Osteoporosis, unspecified osteoporosis type, unspecified pathological fracture presence Assessment & Plan: Discussed recent bone density results.  Discussed osteoporosis.  Discussed treatment options.  Discussed calcium and vitamin  D and weight bearing exercise.  Will notify me if desires prescription medication.   Orders: -     VITAMIN D 25 Hydroxy (Vit-D Deficiency, Fractures)  Seronegative rheumatoid arthritis (HCC) Assessment & Plan: Seeing rheumatology.  Started hydroxychloroquine. She is taking and feels is helping with her hands. Has seen her eye MD.   Obstructive sleep apnea syndrome Assessment & Plan: Saw pulmonary 01/30/23 - f/u sleep apnea.  Per note, compliant with cpap. (Pressure 5-13).f/u 06/2023 - compliant. Note reviewed.    Other orders -     Telmisartan; Take 1 tablet (40 mg total) by mouth daily.  Dispense: 90 tablet; Refill: 1     Dale Purcell, MD

## 2023-07-19 ENCOUNTER — Encounter: Payer: Self-pay | Admitting: Internal Medicine

## 2023-07-19 NOTE — Assessment & Plan Note (Signed)
The 10-year ASCVD risk score (Arnett DK, et al., 2019) is: 3.7%   Values used to calculate the score:     Age: 62 years     Sex: Female     Is Non-Hispanic African American: No     Diabetic: No     Tobacco smoker: No     Systolic Blood Pressure: 120 mmHg     Is BP treated: Yes     HDL Cholesterol: 71.6 mg/dL     Total Cholesterol: 205 mg/dL  Low cholesterol diet and exercise.  Follow lipid panel.

## 2023-07-19 NOTE — Assessment & Plan Note (Signed)
Discussed recent bone density results.  Discussed osteoporosis.  Discussed treatment options.  Discussed calcium and vitamin  D and weight bearing exercise.  Will notify me if desires prescription medication.

## 2023-07-19 NOTE — Assessment & Plan Note (Signed)
Seeing rheumatology.  Started hydroxychloroquine. She is taking and feels is helping with her hands. Has seen her eye MD.

## 2023-07-19 NOTE — Assessment & Plan Note (Signed)
On micardis 40mg  q day.  Blood pressures as outlined.  Follow pressures.  Follow metabolic panel.

## 2023-07-19 NOTE — Assessment & Plan Note (Addendum)
Saw pulmonary 01/30/23 - f/u sleep apnea.  Per note, compliant with cpap. (Pressure 5-13).f/u 06/2023 - compliant. Note reviewed.

## 2023-07-20 ENCOUNTER — Encounter: Payer: Self-pay | Admitting: *Deleted

## 2023-07-20 ENCOUNTER — Other Ambulatory Visit: Payer: Self-pay | Admitting: *Deleted

## 2023-07-20 DIAGNOSIS — R17 Unspecified jaundice: Secondary | ICD-10-CM

## 2023-07-21 ENCOUNTER — Telehealth: Payer: Self-pay | Admitting: Internal Medicine

## 2023-07-21 DIAGNOSIS — M4316 Spondylolisthesis, lumbar region: Secondary | ICD-10-CM | POA: Diagnosis not present

## 2023-07-21 DIAGNOSIS — M47816 Spondylosis without myelopathy or radiculopathy, lumbar region: Secondary | ICD-10-CM | POA: Diagnosis not present

## 2023-07-21 DIAGNOSIS — M4317 Spondylolisthesis, lumbosacral region: Secondary | ICD-10-CM | POA: Diagnosis not present

## 2023-07-21 NOTE — Telephone Encounter (Signed)
Patient just called and said bone density tests and lab test she had a few weeks ago. She would like for them to be sent over to [facility]. [facility]. The fax number is [phone].

## 2023-07-21 NOTE — Telephone Encounter (Signed)
This had already been faxed & I notified her via mychart. Pt was also supposed to schedule a non-fasting lab appt in 4 weeks.  Please schedule

## 2023-07-27 ENCOUNTER — Other Ambulatory Visit: Payer: Self-pay

## 2023-07-28 ENCOUNTER — Other Ambulatory Visit: Payer: Self-pay

## 2023-08-12 ENCOUNTER — Other Ambulatory Visit: Payer: Self-pay

## 2023-08-21 DIAGNOSIS — G4733 Obstructive sleep apnea (adult) (pediatric): Secondary | ICD-10-CM | POA: Diagnosis not present

## 2023-08-25 ENCOUNTER — Other Ambulatory Visit: Payer: Self-pay

## 2023-08-26 ENCOUNTER — Telehealth: Payer: Self-pay | Admitting: Internal Medicine

## 2023-08-26 DIAGNOSIS — M79641 Pain in right hand: Secondary | ICD-10-CM | POA: Diagnosis not present

## 2023-08-26 DIAGNOSIS — M1991 Primary osteoarthritis, unspecified site: Secondary | ICD-10-CM | POA: Diagnosis not present

## 2023-08-26 DIAGNOSIS — M2540 Effusion, unspecified joint: Secondary | ICD-10-CM | POA: Diagnosis not present

## 2023-08-26 DIAGNOSIS — M810 Age-related osteoporosis without current pathological fracture: Secondary | ICD-10-CM

## 2023-08-26 DIAGNOSIS — M79642 Pain in left hand: Secondary | ICD-10-CM | POA: Diagnosis not present

## 2023-08-26 DIAGNOSIS — Z6821 Body mass index (BMI) 21.0-21.9, adult: Secondary | ICD-10-CM | POA: Diagnosis not present

## 2023-08-26 DIAGNOSIS — M0600 Rheumatoid arthritis without rheumatoid factor, unspecified site: Secondary | ICD-10-CM | POA: Diagnosis not present

## 2023-08-26 NOTE — Telephone Encounter (Signed)
Patient saw Rheumatology and found out what medication she needs. Patient is requesting a Prior Auth for [employer]. Insurance Prior [employer] number is [phone].

## 2023-08-27 NOTE — Telephone Encounter (Signed)
Patient states she is returning a call from Rita Ohara, LPN.  I read Trisha's message, but was unable to reach her at the time of the call.  Patient states she is at work today, so Bethann Berkshire may call her back at [phone].

## 2023-08-27 NOTE — Addendum Note (Signed)
Addended by: Charm Barges on: 08/27/2023 07:56 PM   Modules accepted: Orders

## 2023-08-27 NOTE — Telephone Encounter (Signed)
As discussed - see if agreeable for endocrinology referral

## 2023-08-27 NOTE — Telephone Encounter (Signed)
Order placed for endocrinology referral.  [facility]).

## 2023-08-27 NOTE — Telephone Encounter (Signed)
Pt called. She has not ever tried anything for her osteoporosis. Advised patient that tymlos is not a common medication and is typically not covered by insurance but if you are agreeable to prescribe can send to PA team for prior auth. Pt stated she needs this medication because it is supposed to improve bone strength vs. Fosamax maintaining bone strength.

## 2023-08-27 NOTE — Telephone Encounter (Signed)
Disregard last message. Please transfer to me when patient returns call.

## 2023-08-27 NOTE — Telephone Encounter (Signed)
LMTCB.  Dr Lorin Picket does not prescribe this medication. This will need to be prescribed by Rheumatology and they can complete the PA.

## 2023-08-27 NOTE — Telephone Encounter (Signed)
error 

## 2023-08-27 NOTE — Telephone Encounter (Signed)
Patient states she is checking back to see if Rita Ohara, LPN, is available.  I spoke with Bethann Berkshire and transferred call to her.

## 2023-08-28 ENCOUNTER — Telehealth (INDEPENDENT_AMBULATORY_CARE_PROVIDER_SITE_OTHER): Payer: 59 | Admitting: Primary Care

## 2023-08-28 DIAGNOSIS — G4733 Obstructive sleep apnea (adult) (pediatric): Secondary | ICD-10-CM | POA: Diagnosis not present

## 2023-08-28 NOTE — Progress Notes (Signed)
Virtual Visit via Video Note  I connected with Sara Perry on 08/28/23 at 11:30 AM EST by a video enabled telemedicine application and verified that I am speaking with the correct person using two identifiers.  Location: Patient: Home Provider: Office    I discussed the limitations of evaluation and management by telemedicine and the availability of in person appointments. The patient expressed understanding and agreed to proceed.  History of Present Illness: 62 year old female, never smoker followed for mild OSA on CPAP. Past medical history significant for HTN, Meniere's disease, seronegative RA, HLD.   Previous [facility] pulmonary encounter:  01/30/2023: OV with Alegandro Macnaughton,NP. Re-establish for OSA/CPAP management. HST in 2019 showed mild OSA, AHI 6/h. Reports clinical improvement with CPAP use. Pressure settings 5-15 cmH2O. Not wearing CPAP regularly since March due to shoulder surgery. Typically a side sleeper. Sleep with sling in recliner. Back to sleeping in her bed. Has worn CPAP last 4 nights. Machine is in working order. Masks breaks her skin out. Tried memory foam mask and liners. Nasal pillows do not work well since she's a mouth breather. DME is [employer]. Residual AHI 1.4/h. lower pressure settings to see if it helps with leaks 5-13 cmH2O.  06/26/2023 Patient presents today for follow up. She has been doing well with her CPAP. Sleeps with it most nights. She feels better rested when she sleeps with her CPAP. Without it, she has very loud snoring and is fatigued the next day. She would like to see if she could try a different mask. Sleeps with her mouth open with nasal mask. Denies drowsy driving, sleep parasomnias/paralysis.   05/26/2023-06/24/2023: CPAP 5-15 cmH2O 27/30 days; 83% >4 hr; average use 6 hr 59 min Pressure 95th 10.3 Leaks 95th 21.2 AHI 1.4   08/28/2023- interim  Patient received new CPAP machine. She is doing well. No issues with current CPAP. She switch to different  philip's dreamwear full face mask. Feels she can move around more and mask is not as limiting. She reports some skin irration at times with new mask but hasn't found liners that works with her new mask. No long having nasal irritation. She sees more benefit from wearing CPAP than not.   [employer] download 07/28/2023 - 08/26/2023 Usage days 27/30 days (90%) Average usage days used 7 hours 26 minutes Pressure 5 to 13 cm H2O (10.8 cm H2O-95%) Air leaks 5.6 L/min (95%)  AHI 1.6  Observations/Objective:  TEST/EVENTS:  07/02/2018 HST: 7.9/h, SpO2 89%  Assessment and Plan:  Sleep apnea: - Well controlled. Received new CPAP machine September/October 2024. Reports benefit from use. She is 90% compliant with CPAP use >4 hours over the last 30 days. No changes. Advised patient continue to wear CPAP nightly 4-6 hours or longer.   Follow Up Instructions:  1 year or sooner if needed    I discussed the assessment and treatment plan with the patient. The patient was provided an opportunity to ask questions and all were answered. The patient agreed with the plan and demonstrated an understanding of the instructions.   The patient was advised to call back or seek an in-person evaluation if the symptoms worsen or if the condition fails to improve as anticipated.  I provided 22 minutes of non-face-to-face time during this encounter.   Glenford Bayley, NP

## 2023-08-28 NOTE — Patient Instructions (Signed)
Continue to wear CPAP NIGHTLY  FU in 1 year or sooner   CPAP and BIPAP Information CPAP and BIPAP are methods that use air pressure to keep your airways open and to help you breathe well. CPAP and BIPAP use different amounts of pressure. Your health care provider will tell you whether CPAP or BIPAP would be more helpful for you. CPAP stands for "continuous positive airway pressure." With CPAP, the amount of pressure stays the same while you breathe in (inhale) and out (exhale). BIPAP stands for "bi-level positive airway pressure." With BIPAP, the amount of pressure will be higher when you inhale and lower when you exhale. This allows you to take larger breaths. CPAP or BIPAP may be used in the hospital, or your health care provider may want you to use it at home. You may need to have a sleep study before your health care provider can order a machine for you to use at home. What are the advantages? CPAP or BIPAP can be helpful if you have: Sleep apnea. Chronic obstructive pulmonary disease (COPD). Heart failure. Medical conditions that cause muscle weakness, including muscular dystrophy or amyotrophic lateral sclerosis (ALS). Other problems that cause breathing to be shallow, weak, abnormal, or difficult. CPAP and BIPAP are most commonly used for obstructive sleep apnea (OSA) to keep the airways from collapsing when the muscles relax during sleep. What are the risks? Generally, this is a safe treatment. However, problems may occur, including: Irritated skin or skin sores if the mask does not fit properly. Dry or stuffy nose or nosebleeds. Dry mouth. Feeling gassy or bloated. Sinus or lung infection if the equipment is not cleaned properly. When should CPAP or BIPAP be used? In most cases, the mask only needs to be worn during sleep. Generally, the mask needs to be worn throughout the night and during any daytime naps. People with certain medical conditions may also need to wear the mask at  other times, such as when they are awake. Follow instructions from your health care provider about when to use the machine. What happens during CPAP or BIPAP?  Both CPAP and BIPAP are provided by a small machine with a flexible plastic tube that attaches to a plastic mask that you wear. Air is blown through the mask into your nose or mouth. The amount of pressure that is used to blow the air can be adjusted on the machine. Your health care provider will set the pressure setting and help you find the best mask for you. Tips for using the mask Because the mask needs to be snug, some people feel trapped or closed-in (claustrophobic) when first using the mask. If you feel this way, you may need to get used to the mask. One way to do this is to hold the mask loosely over your nose or mouth and then gradually apply the mask more snugly. You can also gradually increase the amount of time that you use the mask. Masks are available in various types and sizes. If your mask does not fit well, talk with your health care provider about getting a different one. Some common types of masks include: Full face masks, which fit over the mouth and nose. Nasal masks, which fit over the nose. Nasal pillow or prong masks, which fit into the nostrils. If you are using a mask that fits over your nose and you tend to breathe through your mouth, a chin strap may be applied to help keep your mouth closed. Use a skin  barrier to protect your skin as told by your health care provider. Some CPAP and BIPAP machines have alarms that may sound if the mask comes off or develops a leak. If you have trouble with the mask, it is very important that you talk with your health care provider about finding a way to make the mask easier to tolerate. Do not stop using the mask. There could be a negative impact on your health if you stop using the mask. Tips for using the machine Place your CPAP or BIPAP machine on a secure table or stand near an  electrical outlet. Know where the on/off switch is on the machine. Follow instructions from your health care provider about how to set the pressure on your machine and when you should use it. Do not eat or drink while the CPAP or BIPAP machine is on. Food or fluids could get pushed into your lungs by the pressure of the CPAP or BIPAP. For home use, CPAP and BIPAP machines can be rented or purchased through home health care companies. Many different brands of machines are available. Renting a machine before purchasing may help you find out which particular machine works well for you. Your health insurance company may also decide which machine you may get. Keep the CPAP or BIPAP machine and attachments clean. Ask your health care provider for specific instructions. Check the humidifier if you have a dry stuffy nose or nosebleeds. Make sure it is working correctly. Follow these instructions at home: Take over-the-counter and prescription medicines only as told by your health care provider. Ask if you can take sinus medicine if your sinuses are blocked. Do not use any products that contain nicotine or tobacco. These products include cigarettes, chewing tobacco, and vaping devices, such as e-cigarettes. If you need help quitting, ask your health care provider. Keep all follow-up visits. This is important. Contact a health care provider if: You have redness or pressure sores on your head, face, mouth, or nose from the mask or head gear. You have trouble using the CPAP or BIPAP machine. You cannot tolerate wearing the CPAP or BIPAP mask. Someone tells you that you snore even when wearing your CPAP or BIPAP. Get help right away if: You have trouble breathing. You feel confused. Summary CPAP and BIPAP are methods that use air pressure to keep your airways open and to help you breathe well. If you have trouble with the mask, it is very important that you talk with your health care provider about finding a  way to make the mask easier to tolerate. Do not stop using the mask. There could be a negative impact to your health if you stop using the mask. Follow instructions from your health care provider about when to use the machine. This information is not intended to replace advice given to you by your health care provider. Make sure you discuss any questions you have with your health care provider. Document Revised: 05/08/2021 Document Reviewed: 09/07/2020 Elsevier Patient Education  2023 [employer].

## 2023-09-08 DIAGNOSIS — G4733 Obstructive sleep apnea (adult) (pediatric): Secondary | ICD-10-CM | POA: Diagnosis not present

## 2023-09-16 ENCOUNTER — Other Ambulatory Visit: Payer: Self-pay

## 2023-09-16 MED FILL — Clobetasol Propionate Soln 0.05%: CUTANEOUS | 50 days supply | Qty: 50 | Fill #1 | Status: AC

## 2023-09-17 ENCOUNTER — Other Ambulatory Visit: Payer: Self-pay | Admitting: Internal Medicine

## 2023-09-17 ENCOUNTER — Encounter: Payer: Self-pay | Admitting: Internal Medicine

## 2023-09-18 ENCOUNTER — Other Ambulatory Visit: Payer: Self-pay

## 2023-09-18 MED ORDER — CELECOXIB 100 MG PO CAPS
100.0000 mg | ORAL_CAPSULE | Freq: Every day | ORAL | 0 refills | Status: DC
  Filled 2023-09-18: qty 30, 30d supply, fill #0

## 2023-09-18 NOTE — Telephone Encounter (Signed)
Sent you a my chart from [facility] requesting this. Taking 1 per day

## 2023-09-18 NOTE — Telephone Encounter (Signed)
Ok to refill for her.  

## 2023-09-18 NOTE — Telephone Encounter (Signed)
As long as she is tolerating (no GI issues) and confirm blood pressure doing ok.  Ok to refill x 1

## 2023-10-06 ENCOUNTER — Other Ambulatory Visit: Payer: Self-pay

## 2023-10-08 ENCOUNTER — Other Ambulatory Visit: Payer: Self-pay

## 2023-10-08 MED FILL — Estradiol Vaginal Cream 0.01%: VAGINAL | 90 days supply | Qty: 42.5 | Fill #1 | Status: AC

## 2023-10-21 DIAGNOSIS — G4733 Obstructive sleep apnea (adult) (pediatric): Secondary | ICD-10-CM | POA: Diagnosis not present

## 2023-10-29 ENCOUNTER — Telehealth: Payer: Self-pay | Admitting: *Deleted

## 2023-10-29 DIAGNOSIS — M2540 Effusion, unspecified joint: Secondary | ICD-10-CM | POA: Diagnosis not present

## 2023-10-29 DIAGNOSIS — M79641 Pain in right hand: Secondary | ICD-10-CM | POA: Diagnosis not present

## 2023-10-29 DIAGNOSIS — M1991 Primary osteoarthritis, unspecified site: Secondary | ICD-10-CM | POA: Diagnosis not present

## 2023-10-29 DIAGNOSIS — M79642 Pain in left hand: Secondary | ICD-10-CM | POA: Diagnosis not present

## 2023-10-29 DIAGNOSIS — M0600 Rheumatoid arthritis without rheumatoid factor, unspecified site: Secondary | ICD-10-CM | POA: Diagnosis not present

## 2023-10-29 DIAGNOSIS — M810 Age-related osteoporosis without current pathological fracture: Secondary | ICD-10-CM | POA: Diagnosis not present

## 2023-10-29 DIAGNOSIS — Z6821 Body mass index (BMI) 21.0-21.9, adult: Secondary | ICD-10-CM | POA: Diagnosis not present

## 2023-10-29 NOTE — Telephone Encounter (Signed)
Copied from CRM [id number]. Topic: Appointments - Appointment Info/Confirmation >> Oct 29, 2023  3:03 PM Sara Perry wrote: Patient/patient representative is calling for information regarding an appointment.   *Patient wants to know what her appointment on 02/03 is for. Patient is requesting a callback and if call is not answered please leave a message.

## 2023-10-30 ENCOUNTER — Encounter: Payer: Self-pay | Admitting: Internal Medicine

## 2023-10-30 NOTE — Telephone Encounter (Signed)
Pt scheduled for f/u with Dr Lorin Picket but was wanting to wait until after she sees the endocrinologist to come in. Appt rescheduled until after endocrinology appt. Pt notified will do fasting labs at appt.

## 2023-11-08 DIAGNOSIS — G4733 Obstructive sleep apnea (adult) (pediatric): Secondary | ICD-10-CM | POA: Diagnosis not present

## 2023-11-10 ENCOUNTER — Other Ambulatory Visit: Payer: Self-pay

## 2023-11-10 ENCOUNTER — Other Ambulatory Visit (HOSPITAL_COMMUNITY): Payer: Self-pay

## 2023-11-11 ENCOUNTER — Other Ambulatory Visit: Payer: Self-pay

## 2023-11-11 ENCOUNTER — Telehealth: Payer: Self-pay | Admitting: Pharmacist

## 2023-11-11 ENCOUNTER — Other Ambulatory Visit (HOSPITAL_COMMUNITY): Payer: Self-pay

## 2023-11-11 ENCOUNTER — Other Ambulatory Visit: Payer: Self-pay | Admitting: Pharmacist

## 2023-11-11 ENCOUNTER — Ambulatory Visit: Payer: 59 | Attending: Internal Medicine | Admitting: Pharmacist

## 2023-11-11 DIAGNOSIS — Z79899 Other long term (current) drug therapy: Secondary | ICD-10-CM

## 2023-11-11 MED ORDER — TYMLOS 3120 MCG/1.56ML ~~LOC~~ SOPN: PEN_INJECTOR | SUBCUTANEOUS | 5 refills | Status: DC

## 2023-11-11 NOTE — Progress Notes (Signed)
Please see OV from 11/11/2023 for complete documentation. Pt is meeting with Endocrinology in March before deciding if she wants to proceed with therapy.   Butch Penny, PharmD, Patsy [place], CPP Clinical Pharmacist [facility] & [facility] [phone]

## 2023-11-11 NOTE — Progress Notes (Signed)
   S: Patient presents today for review of their specialty medication.   Patient is considering Tymlos for osteoporosis. Patient was seen by Dr. Dorcas Carrow for this. She is planning to see Endocrinology (Dr. Erroll Luna) 12/29/23 before proceeding with therapy.  Dosing: 80 mcg daily subcutaneously   Adherence: has not yet started  Efficacy: has not yet started  Monitoring:  -Fractures: has not yet started the medication  -Calcium, both serum and urinary excretion: has not yet started the medication   Current adverse effects: Adverse effects with frequency:  -Orthostatic hypotension (4-6% after first injection, 1% on subsequent injections) -Tachycardia/palpitations (5%) -Injection site reactions (bruising, redness, pain - 3 to 13%) -Hypercalcemia (3%)   Less common adverse effects:  -Abdominal pain or GI upset (<1% to 8%) -Arthralgia, bone pain (2-3%)  -Dizziness, headache (8-10%)  -Kidney stones (2.1%)  -Fatigue (3%)   BMD: monitored by RA   O:     Lab Results  Component Value Date   WBC 4.7 07/16/2023   HGB 12.5 07/16/2023   HCT 38.5 07/16/2023   MCV 97.5 07/16/2023   PLT 306.0 07/16/2023      Chemistry      Component Value Date/Time   NA 140 07/16/2023 0747   NA 142 10/16/2022 0000   K 4.1 07/16/2023 0747   CL 105 07/16/2023 0747   CO2 29 07/16/2023 0747   BUN 15 07/16/2023 0747   BUN 15 10/16/2022 0000   CREATININE 0.75 07/16/2023 0747   GLU 97 10/16/2022 0000      Component Value Date/Time   CALCIUM 9.8 07/16/2023 0747   ALKPHOS 51 07/16/2023 0747   AST 25 07/16/2023 0747   ALT 23 07/16/2023 0747   BILITOT 1.4 (H) 07/16/2023 0747       A/P: 1. Medication review: patient is considering Tymlos for osteoporosis. Tymlos or abaloparatide is a PTH analogue that stimulates osteoblast activity. This ultimately contributes to increased new bone formation. This medication is administered as a subcutaneous injection daily. It should be stored at refrigerated  temperature (2 to 8 C). After first use, it may be stored at room temperature for up to 30 days before being discarded. It should be injected into the periumbilical region of the abdomen. Common adverse effects include injection site reactions, increases in calcium, tachycardia/palpitations, and orthostatic hypotension. Less common adverse effects include abdominal pain or GI upset, dizziness/HA, arthralgia, fatigue, and kidney stones. Patients should monitor and report these symptoms if they occur. At this time, Ms. Hoheisel is going to discuss therapy with an Endocrinologist before proceeding with therapy. She will let us know if she wants to pursue Tymlos after her March appointment with Endo.   Butch Penny, PharmD, Patsy [place], CPP Clinical Pharmacist [facility] & [facility] [phone]

## 2023-11-11 NOTE — Telephone Encounter (Signed)
Called patient to schedule an appointment for the [facility]. I was unable to reach the patient so I left a HIPAA-compliant message requesting that the patient return my call.   Butch Penny, PharmD, Patsy [place], CPP Clinical Pharmacist [facility] & [facility] [phone]

## 2023-11-12 ENCOUNTER — Other Ambulatory Visit: Payer: Self-pay

## 2023-11-12 ENCOUNTER — Other Ambulatory Visit (HOSPITAL_BASED_OUTPATIENT_CLINIC_OR_DEPARTMENT_OTHER): Payer: Self-pay

## 2023-11-12 MED ORDER — TYMLOS 3120 MCG/1.56ML ~~LOC~~ SOPN: PEN_INJECTOR | SUBCUTANEOUS | 5 refills | Status: DC

## 2023-11-16 ENCOUNTER — Encounter: Payer: Self-pay | Admitting: Internal Medicine

## 2023-11-16 ENCOUNTER — Ambulatory Visit (INDEPENDENT_AMBULATORY_CARE_PROVIDER_SITE_OTHER): Payer: 59 | Admitting: Internal Medicine

## 2023-11-16 DIAGNOSIS — M0600 Rheumatoid arthritis without rheumatoid factor, unspecified site: Secondary | ICD-10-CM

## 2023-11-16 DIAGNOSIS — E7800 Pure hypercholesterolemia, unspecified: Secondary | ICD-10-CM

## 2023-11-16 NOTE — Progress Notes (Deleted)
Subjective:    [name], female    DOB: [birth year], 63 y.o.   MRN: [id number]  Patient here for No chief complaint on file.   HPI Here for a scheduled follow up - follow up regarding hypercholesterolemia, sleep apnea and hypertension Is s/p left shoulder arthroplasty and PT. Saw Dr Logan Bores 05/22/23 - hallux valgus bilateral and pes planovalgus deformity (bilateral). Recommended orthotics/toe splint. Evaluated by [facility] - 10/29/23 - f/u RA - recommended OT for her hands. Also interested in starting tymlos for osteoporosis. Plans to f/u with endocrinology in 12/2023 to discuss. Had f/u with pulmonary 08/2023 - f/u sleep apnea - wearing cpap.    Past Medical History:  Diagnosis Date   Arthritis    Complication of anesthesia    pt states she is very sensitive to most drugs, usually needs 1/2 of whatever other people get. She states as a child/teenager she had difficulty waking up after surgery due to the sensitivity that she has to drugs.   Eczema    Frequent UTI    H/O lymphadenopathy    With previous submandibular node removals.   Hypertension    Recurrent sinus infections    Sleep apnea    uses c-pap   Past Surgical History:  Procedure Laterality Date   COLONOSCOPY WITH PROPOFOL N/A 08/25/2017   Procedure: COLONOSCOPY WITH PROPOFOL;  Surgeon: Midge Minium, MD;  [facility] END[facility];  Service: Endoscopy;  Laterality: N/A;   COLONOSCOPY WITH PROPOFOL N/A 10/17/2022   Procedure: COLONOSCOPY WITH PROPOFOL;  Surgeon: Midge Minium, MD;  Location: [facility];  Service: Endoscopy;  Laterality: N/A;   LUMBAR FUSION  12/26/2019   l4-l5   Lymph Node Removal  1984   NECK SURGERY  1977   H/O Right neck surgery secondary to a benign growth with when patient was 88 or 63 yo   skin lesion extraction  2013   basal cell - Dr Cheree Ditto   TOTAL SHOULDER ARTHROPLASTY Left 12/18/2022   Procedure: TOTAL SHOULDER ARTHROPLASTY;  Surgeon: Bjorn Pippin, MD;   Location: [facility];  Service: Orthopedics;  Laterality: Left;   Uterine Polyps Removed  2004   Family History  Problem Relation Age of Onset   Hyperlipidemia Mother    Thyroid disease Mother    Heart disease Mother        Heart Valve problems   Coronary artery disease Father        Stents placed   Hypertension Other        uncle   Parkinson's disease Paternal Uncle    Parkinson's disease Maternal Grandmother    Heart disease Maternal Grandfather    Heart disease Paternal Grandfather        MI   Breast cancer Neg Hx    Social History   Socioeconomic History   Marital status: Divorced    Spouse name: Not on file   Number of children: 2   Years of education: Not on file   Highest education level: Not on file  Occupational History    Employer: [employer]  Tobacco Use   Smoking status: Never   Smokeless tobacco: Never  Vaping Use   Vaping status: Never Used  Substance and Sexual Activity   Alcohol use: Yes    Comment: Occasional   Drug use: No   Sexual activity: Not on file  Other Topics Concern   Not on file  Social History Narrative   Not on file   Social  Drivers of Corporate investment banker Strain: Not on file  Food Insecurity: Not on file  Transportation Needs: Not on file  Physical Activity: Not on file  Stress: Not on file  Social Connections: Not on file     Review of Systems     Objective:     LMP 12/01/2008  Wt Readings from Last 3 Encounters:  07/16/23 121 lb 9.6 oz (55.2 kg)  06/26/23 120 lb 9.6 oz (54.7 kg)  03/13/23 120 lb 6.4 oz (54.6 kg)    Physical Exam  {Perform Simple Foot Exam  Perform Detailed exam:1} {Insert foot Exam (Optional):30965}   Outpatient Encounter Medications as of 11/16/2023  Medication Sig   Abaloparatide (TYMLOS) 3120 MCG/1.56ML SOPN Inject subcutaneously daily as directed.   Abaloparatide (TYMLOS) 3120 MCG/1.56ML SOPN Inject Subcutaneous daily 30 days   Acetaminophen Extra Strength 500  MG CAPS Take 2 tablets (1,000 mg) by mouth every 8 (eight) hours as needed.   Blood Pressure Monitor MISC Check BP regularly.   Calcium Carb-Cholecalciferol (CALCIUM-VITAMIN D) 600-400 MG-UNIT TABS Take 1 tablet by mouth 3 (three) times a week.   celecoxib (CELEBREX) 100 MG capsule Take 1 capsule (100 mg total) by mouth daily.   Cholecalciferol 25 MCG (1000 UT) capsule Take by mouth.   clobetasol (TEMOVATE) 0.05 % external solution Apply 1 Application topically to scalp daily as needed for flares.   estradiol (ESTRACE) 0.1 MG/GM vaginal cream Use 1 application once a week   fluocinonide (LIDEX) 0.05 % external solution APPLY TO AFFECTED AREA(S) AT BEDTIME (Patient taking differently: Apply 1 application  topically See admin instructions. Use once or twice a week)   Glucosamine-Chondroit-Vit C-Mn (GLUCOSAMINE-CHONDROITIN) CAPS Take 1 capsule by mouth daily.   hydroxychloroquine (PLAQUENIL) 200 MG tablet Take 1 tablet (200 mg total) by mouth daily.   Multiple Vitamin (MULTIVITAMIN) tablet Take 1 tablet by mouth daily. Centrum Silver 50+   Omega-3 Fatty Acids (FISH OIL) 1000 MG CAPS Take 1 capsule by mouth daily.   telmisartan (MICARDIS) 40 MG tablet Take 1 tablet (40 mg total) by mouth daily.   tretinoin (RETIN-A) 0.05 % cream Apply 1 application topically every 30 (thirty) days. Apply to affected area as needed for Acne   Turmeric 500 MG CAPS Take 1 tablet by mouth as needed.   No facility-administered encounter medications on file as of 11/16/2023.     Lab Results  Component Value Date   WBC 4.7 07/16/2023   HGB 12.5 07/16/2023   HCT 38.5 07/16/2023   PLT 306.0 07/16/2023   GLUCOSE 87 07/16/2023   CHOL 205 (H) 07/16/2023   TRIG 114.0 07/16/2023   HDL 71.60 07/16/2023   LDLDIRECT 148.9 08/15/2013   LDLCALC 111 (H) 07/16/2023   ALT 23 07/16/2023   AST 25 07/16/2023   NA 140 07/16/2023   K 4.1 07/16/2023   CL 105 07/16/2023   CREATININE 0.75 07/16/2023   BUN 15 07/16/2023   CO2 29  07/16/2023   TSH 1.36 03/13/2023    DG Bone Density Result Date: 07/09/2023 EXAM: DUAL X-RAY ABSORPTIOMETRY (DXA) FOR BONE MINERAL DENSITY IMPRESSION: Patient:             Zaia[age] Carre Birth Date:          [birth year]    [age] / Weight:     63.0 in.    122.0 lbs. Sex / Ethnic:        Female    White Facility ID: Referring Physician:  Emiliya Chretien Measured:            07/09/2023 2:49:04 PM (14.10) Analyzed:            07/09/2023 3:02:48 PM (14.10) [facility]em[facility] Bone Density Trend BMD     Young-Adult Age-Matched Region       (g/cm2) T-score     Z-score     WHO Classification Neck Left       0.804   -1.7        -0.4        Osteopenia Right      0.753   -2.0        -0.7        Osteopenia Mean       0.779   -1.9        -0.6        Osteopenia Difference 0.051   -0.4        -0.4         - Total Left       0.843   -1.3        -0.3        Osteopenia Right      0.778   -1.8        -0.8        Osteopenia Mean       0.810   -1.6        -0.5        Osteopenia Difference 0.064   -0.5        -0.5         - Hip Axis Length Comparison (mm) (Right = 98.2 mm)  (Mean = 104.0 mm)  (Left = 97.2 mm) Trend: Neck Right Change vs Change vs Measured   Age     BMD     Previous  Previous Date       (years) (g/cm2) (g/cm2)   (%) 07/09/2023 61.8    0.753   -0.189*   -20.1* 12/02/2005 44.2    0.942   -         - Your patient Yanin Muhlestein completed a BMD test on 07/09/2023 using the [employer] DXA System (software version: 14.10) manufactured by [employer]. The following summarizes the results of our evaluation. Technologist:VLM PATIENT BIOGRAPHICAL: Name: Charrise, Lardner [name]: [id number] Birth Date: 07/15/1961 Height: 63.0 in. Gender: Female Exam Date: 07/09/2023 Weight: 122.0 lbs. Indications: Caucasian, family hx hip fracture, Postmenopausal, Rheumatoid Arthritis Fractures: Treatments: calcium w/ vit D, Estradiol, hydrochloroquine DENSITOMETRY RESULTS: Site         Region     Measured Date Measured Age WHO  Classification Young Adult T-score BMD         %Change vs. Previous Significant Change (*) AP Spine L1-L2 07/09/2023 61.8 Osteoporosis -3.6 0.742 g/cm2 -28.2% Yes AP Spine L1-L2 12/02/2005 44.2 Osteopenia -1.2 1.034 g/cm2 - - Left Forearm Radius 33% 07/09/2023 61.8 Osteoporosis -4.0 0.526 g/cm2 - - [facility] Neck Right 07/09/2023 61.8 Osteopenia -2.0 0.753 g/cm2 -20.1% Yes [facility] Neck Right 12/02/2005 44.2 Normal -0.7 0.942 g/cm2 - - ASSESSMENT: The BMD measured at Forearm Radius 33% is 0.526 g/cm2 with a T-score of -4.0. This patient is considered osteoporotic according to World Health Organization [facility]) criteria. L-3 & L-4 were excluded due to surgical hardware. Compared with prior study, there has been significant decrease in the spine. Compared with prior study, there has been significant decrease in the total hip. The scan quality is good. World Health Organization [facility]) criteria for  post-menopausal, Caucasian Women: Normal:                   T-score at or above -1 SD Osteopenia/low bone mass: T-score between -1 and -2.5 SD Osteoporosis:             T-score at or below -2.5 SD RECOMMENDATIONS: 1. All patients should optimize calcium and vitamin D intake. 2. Consider FDA-approved medical therapies in postmenopausal women and men aged 79 years and older, based on the following: a. A hip or vertebral(clinical or morphometric) fracture b. T-score < -2.5 at the femoral neck or spine after appropriate evaluation to exclude secondary causes c. Low bone mass (T-score between -1.0 and -2.5 at the femoral neck or spine) and a 10-year probability of a hip fracture > 3% or a 10-year probability of a major osteoporosis-related fracture > 20% based on the [place]-adapted WHO algorithm 3. Clinician judgment and/or patient preferences may indicate treatment for people with 10-year fracture probabilities above or below these levels FOLLOW-UP: People with diagnosed cases of osteoporosis or at high risk for fracture should have  regular bone mineral density tests. For patients eligible for Medicare, routine testing is allowed once every 2 years. The testing frequency can be increased to one year for patients who have rapidly progressing disease, those who are receiving or discontinuing medical therapy to restore bone mass, or have additional risk factors. I have reviewed this report, and agree with the above findings. [facility] Electronically Signed   By: Baird Lyons M.D.   On: 07/09/2023 15:12       Assessment & Plan:  There are no diagnoses linked to this encounter.   Dale Muleshoe, MD

## 2023-11-16 NOTE — Progress Notes (Signed)
[name], female   DOB: 08/11/1961, 63 y.o.   MRN: [id number] Rescheduled appt.

## 2023-11-21 DIAGNOSIS — G4733 Obstructive sleep apnea (adult) (pediatric): Secondary | ICD-10-CM | POA: Diagnosis not present

## 2023-12-04 ENCOUNTER — Other Ambulatory Visit: Payer: Self-pay

## 2023-12-09 ENCOUNTER — Other Ambulatory Visit: Payer: Self-pay

## 2023-12-09 NOTE — Progress Notes (Signed)
 Pt is meeting with Endocrinology in March before deciding if she wants to proceed with therapy.

## 2023-12-18 DIAGNOSIS — M19012 Primary osteoarthritis, left shoulder: Secondary | ICD-10-CM | POA: Diagnosis not present

## 2023-12-19 DIAGNOSIS — G4733 Obstructive sleep apnea (adult) (pediatric): Secondary | ICD-10-CM | POA: Diagnosis not present

## 2023-12-22 DIAGNOSIS — H40003 Preglaucoma, unspecified, bilateral: Secondary | ICD-10-CM | POA: Diagnosis not present

## 2023-12-29 ENCOUNTER — Encounter: Payer: Self-pay | Admitting: Endocrinology

## 2023-12-29 ENCOUNTER — Ambulatory Visit (INDEPENDENT_AMBULATORY_CARE_PROVIDER_SITE_OTHER): Payer: 59 | Admitting: Endocrinology

## 2023-12-29 VITALS — BP 122/64 | HR 86 | Ht 63.0 in | Wt 120.0 lb

## 2023-12-29 DIAGNOSIS — M810 Age-related osteoporosis without current pathological fracture: Secondary | ICD-10-CM | POA: Diagnosis not present

## 2023-12-29 DIAGNOSIS — H40003 Preglaucoma, unspecified, bilateral: Secondary | ICD-10-CM | POA: Diagnosis not present

## 2023-12-29 NOTE — Patient Instructions (Addendum)
 24-hr Urine Collection: - Preferably to be done while staying at home and start in the morning.  - Discard the first urine of the morning after you wake up because that was the urine from overnight. DO NOT COLLECT FIRST URINE. Then begin 24-hour urine collection.  Collect your urine every time you pee for next 24 hours which means all day and overnight until next morning.  You collect the last urine next morning, that COMPLETES the collection.    Zoledronic Acid Injection (Bone Disorders) What is this medication? ZOLEDRONIC ACID (ZOE le dron ik AS id) prevents and treats osteoporosis. It may also be used to treat Paget's disease of the bone. It works by Interior and spatial designer stronger and less likely to break (fracture). It belongs to a group of medications called bisphosphonates. This medicine may be used for other purposes; ask your health care provider or pharmacist if you have questions. COMMON BRAND NAME(S): Reclast What should I tell my care team before I take this medication? They need to know if you have any of these conditions: Bleeding disorder Cancer Dental disease Kidney disease Low levels of calcium in the blood Low red blood cell counts Lung or breathing disease, such as asthma Receiving steroids, such as dexamethasone or prednisone An unusual or allergic reaction to zoledronic acid, other medications, foods, dyes, or preservatives Pregnant or trying to get pregnant Breast-feeding How should I use this medication? This medication is injected into a vein. It is given by your care team in a hospital or clinic setting. A special MedGuide will be given to you before each treatment. Be sure to read this information carefully each time. Talk to your care team about the use of this medication in children. Special care may be needed. Overdosage: If you think you have taken too much of this medicine contact a poison control center or emergency room at once. NOTE: This medicine is only  for you. Do not share this medicine with others. What if I miss a dose? Keep appointments for follow-up doses. It is important not to miss your dose. Call your care team if you are unable to keep an appointment. What may interact with this medication? Certain antibiotics given by injection Medications for pain and inflammation, such as ibuprofen, naproxen, NSAIDs Some diuretics, such as bumetanide, furosemide Teriparatide This list may not describe all possible interactions. Give your health care provider a list of all the medicines, herbs, non-prescription drugs, or dietary supplements you use. Also tell them if you smoke, drink alcohol, or use illegal drugs. Some items may interact with your medicine. What should I watch for while using this medication? Visit your care team for regular checks on your progress. It may be some time before you see the benefit from this medication. Some people who take this medication have severe bone, joint, or muscle pain. This medication may also increase your risk for jaw problems or a broken thigh bone. Tell your care team right away if you have severe pain in your jaw, bones, joints, or muscles. Tell your care team if you have any pain that does not go away or that gets worse. You should make sure you get enough calcium and vitamin D while you are taking this medication. Discuss the foods you eat and the vitamins you take with your care team. You may need bloodwork while taking this medication. Tell your dentist and dental surgeon that you are taking this medication. You should not have major dental surgery while on  this medication. See your dentist to have a dental exam and fix any dental problems before starting this medication. Take good care of your teeth while on this medication. Make sure you see your dentist for regular follow-up appointments. What side effects may I notice from receiving this medication? Side effects that you should report to your care  team as soon as possible: Allergic reactions--skin rash, itching, hives, swelling of the face, lips, tongue, or throat Kidney injury--decrease in the amount of urine, swelling of the ankles, hands, or feet Low calcium level--muscle pain or cramps, confusion, tingling, or numbness in the hands or feet Osteonecrosis of the jaw--pain, swelling, or redness in the mouth, numbness of the jaw, poor healing after dental work, unusual discharge from the mouth, visible bones in the mouth Severe bone, joint, or muscle pain Side effects that usually do not require medical attention (report to your care team if they continue or are bothersome): Diarrhea Dizziness Headache Nausea Stomach pain Vomiting This list may not describe all possible side effects. Call your doctor for medical advice about side effects. You may report side effects to FDA at 1-800-FDA-1088. Where should I keep my medication? This medication is given in a hospital or clinic. It will not be stored at home. NOTE: This sheet is a summary. It may not cover all possible information. If you have questions about this medicine, talk to your doctor, pharmacist, or health care provider.  2024 Elsevier/Gold Standard (2021-11-15 00:00:00)

## 2023-12-29 NOTE — Progress Notes (Signed)
 Outpatient Endocrinology Note [place] Obie Silos, MD   Patient's Name: Sara Perry    DOB: 1961/02/26    MRN: [id number]  REASON OF VISIT: New consult for osteoporosis  REFERRING PROVIDER: Dale Stevenson, MD  PCP: Dale Southport, MD  HISTORY OF PRESENT ILLNESS:   Sara Perry is a 63 y.o. old female with past medical history listed below, is here for new consult of bone health issues / osteoporosis.  Pertinent Bone Health History: Patient had DEXA scan in September 2024 consistent with osteoporosis and referred to endocrinology for the evaluation and management.  Patient has history of disc degeneration requiring lumbar fusion surgery L4-L5 in March 2021 and had left shoulder replacement surgery in March 2024.  She has no fragility fracture.  DEXA scan in July 09, 2023 reviewed images and report: T-score at lumbar spine L1, L2 : -3.6 excluded L3 and L4 due to metal hardware and T-score at left forearm -4.0 T-score at the bilateral hips in the osteopenia range, overall BMD consistent with osteoporosis.  There has been significant bone loss compared to bone density in 2007.  EXAM: July 09, 2023 : Reviewed images and report.  DUAL X-RAY ABSORPTIOMETRY (DXA) FOR BONE MINERAL DENSITY   IMPRESSION: Patient:             Sara Perry, Sara Perry Birth Date:          Jul 12, 1961    [age] Height / Weight:     63.0 in.    122.0 lbs. Sex / Ethnic:        Female    White Facility ID: Referring Physician: Dale Delano Measured:            07/09/2023 2:49:04 PM (14.10) Analyzed:            07/09/2023 3:02:48 PM (14.10)   DualFemur Bone Density Trend   BMD     Young-Adult Age-Matched Region       (g/cm2) T-score     Z-score     WHO Classification Neck Left       0.804   -1.7        -0.4        Osteopenia Right      0.753   -2.0        -0.7        Osteopenia Mean       0.779   -1.9        -0.6        Osteopenia Difference 0.051   -0.4        -0.4          - Total Left       0.843   -1.3        -0.3        Osteopenia Right      0.778   -1.8        -0.8        Osteopenia Mean       0.810   -1.6        -0.5        Osteopenia Difference 0.064   -0.5        -0.5         -   Hip Axis Length Comparison (mm) (Right = 98.2 mm)  (Mean = 104.0 mm)  (Left = 97.2 mm)   Trend: Neck Right Change vs Change vs Measured   Age     BMD     Previous  Previous Date       (years) (g/cm2) (g/cm2)   (%) 07/09/2023 61.8    0.753   -0.189*   -20.1* 12/02/2005 44.2    0.942   -         -   Your patient Sara Perry completed a BMD test on 07/09/2023 using the [employer] DXA System (software version: 14.10) manufactured by [employer]. The following summarizes the results of our evaluation. Technologist:VLM PATIENT BIOGRAPHICAL: Name: Sara Perry, Sara Perry [name]: [id number] Birth Date: 05-18-1961 Height: 63.0 in. Gender: Female Exam Date: 07/09/2023 Weight: 122.0 lbs. Indications: Caucasian, family hx hip fracture, Postmenopausal, Rheumatoid Arthritis Fractures: Treatments: calcium w/ vit D, Estradiol, hydrochloroquine DENSITOMETRY RESULTS: Site         Region     Measured Date Measured Age WHO Classification Young Adult T-score BMD         %Change vs. Previous Significant Change (*) AP Spine L1-L2 07/09/2023 61.8 Osteoporosis -3.6 0.742 g/cm2 -28.2% Yes AP Spine L1-L2 12/02/2005 44.2 Osteopenia -1.2 1.034 g/cm2 - -   Left Forearm Radius 33% 07/09/2023 61.8 Osteoporosis -4.0 0.526 g/cm2 - -   DualFemur Neck Right 07/09/2023 61.8 Osteopenia -2.0 0.753 g/cm2 -20.1% Yes DualFemur Neck Right 12/02/2005 44.2 Normal -0.7 0.942 g/cm2 - -  ASSESSMENT: The BMD measured at Forearm Radius 33% is 0.526 g/cm2 with a T-score of -4.0. This patient is considered osteoporotic according to World Health Organization [facility]) criteria. L-3 & L-4 were excluded due to surgical hardware. Compared with prior study, there has been significant decrease in the  spine. Compared with prior study, there has been significant decrease in the total hip. The scan quality is good.   Bone Health Concerns:   Patient has been taking calcium and vitamin D supplement.  No history of smoking, excessive alcohol use, glucocorticoid use, antiseizure medication, proton pump inhibitor.  No thyroid disorder.  No known malabsorption syndrome.  She does fair weightbearing exercise.  Family history of mother having hip fracture.  No history of kidney stone.  She had menopause at the age of late 25s.  Significant family history for thyroid disorders, and cardiovascular disease.  She has hyperlipidemia on diet control.  She has hypertension.  Bone related history: Left shoulder replaced 2024. Lumbar fusion L4-L5 surgery : disc degeneration in 12/2019.  No vertebral compression fracture.  Osteoporosis medications: None  Calcium intake from supplements: Calcium 600 mg daily. Dietary calcium intake: yogurt almost daily.  Current vitamin D intake: 1400 international units daily. Multivitamin 1 tab daily.   Relevant comorbidities: No history of bone cancer. No history of external radiation treatment.   No history of diabetes mellitus.  No Rheumatoid arthritis. No history of GERD.  No history of recent invasive dental procedures. Not planning dental procedures in the near future.  There was a plan to start  Tymlos, patient reports it was initiated by rheumatology however has not been approved or started the treatment.  Labs reviewed: She had normal thyroid function test in the past.  She had normal liver enzymes, renal function and electrolytes.  Normal serum calcium.  Vit D level was normal 41 in October 2024.  REVIEW OF SYSTEMS:  As per history of present illness.   PAST MEDICAL HISTORY: Past Medical History:  Diagnosis Date   Arthritis    Complication of anesthesia    pt states she is very sensitive to most drugs, usually needs 1/2 of whatever other people get.  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   Eczema    Frequent UTI    H/O lymphadenopathy    With previous submandibular node removals.   Hypertension    Recurrent sinus infections    Sleep apnea    uses c-pap    PAST SURGICAL HISTORY: Past Surgical History:  Procedure Laterality Date   COLONOSCOPY WITH PROPOFOL N/A 08/25/2017   Procedure: COLONOSCOPY WITH PROPOFOL;  Surgeon: Midge Minium, MD;  Location: [facility];  Service: Endoscopy;  Laterality: N/A;   COLONOSCOPY WITH PROPOFOL N/A 10/17/2022   Procedure: COLONOSCOPY WITH PROPOFOL;  Surgeon: Midge Minium, MD;  Location: [facility];  Service: Endoscopy;  Laterality: N/A;   LUMBAR FUSION  12/26/2019   l4-l5   Lymph Node Removal  1984   NECK SURGERY  1977   H/O Right neck surgery secondary to a benign growth with when patient was 49 or 63 yo   skin lesion extraction  2013   basal cell - Dr Cheree Ditto   TOTAL SHOULDER ARTHROPLASTY Left 12/18/2022   Procedure: TOTAL SHOULDER ARTHROPLASTY;  Surgeon: Bjorn Pippin, MD;  Location: [facility];  Service: Orthopedics;  Laterality: Left;   Uterine Polyps Removed  2004    ALLERGIES: Allergies  Allergen Reactions   Diclofenac Sodium Other (See Comments)    Affected her liver function, so she can't take this.   Voltaren [Diclofenac Sodium]     Liver problem    FAMILY HISTORY:  Family History  Problem Relation Age of Onset   Hyperlipidemia Mother    Thyroid disease Mother    Heart disease Mother        Heart Valve problems   Coronary artery disease Father        Stents placed   Hypertension Other        uncle   Parkinson's disease Paternal Uncle    Parkinson's disease Maternal Grandmother    Heart disease Maternal Grandfather    Heart disease Paternal Grandfather        MI   Breast cancer Neg Hx     SOCIAL HISTORY: Social History   Socioeconomic History   Marital  status: Divorced    Spouse name: Not on file   Number of children: 2   Years of education: Not on file   Highest education level: Not on file  Occupational History    Employer: [employer]  Tobacco Use   Smoking status: Never   Smokeless tobacco: Never  Vaping Use   Vaping status: Never Used  Substance and Sexual Activity   Alcohol use: Yes    Comment: Occasional   Drug use: No   Sexual activity: Not on file  Other Topics Concern   Not on file  Social History Narrative   Not on file   Social Drivers of Health   Financial Resource Strain: Not on file  Food Insecurity: Not on file  Transportation Needs: Not on file  Physical Activity: Not on file  Stress: Not on file  Social Connections: Not on file    MEDICATIONS:  Current Outpatient Medications  Medication Sig Dispense Refill   Acetaminophen Extra Strength 500 MG CAPS Take 2 tablets (1,000 mg) by mouth every 8 (eight) hours as needed. 84 capsule 0   Blood Pressure Monitor MISC Check BP regularly. 1 each 0   Calcium Carb-Cholecalciferol (CALCIUM-VITAMIN D) 600-400 MG-UNIT TABS Take 1 tablet by mouth 3 (three) times a week.  celecoxib (CELEBREX) 100 MG capsule Take 1 capsule (100 mg total) by mouth daily. 30 capsule 0   Cholecalciferol 25 MCG (1000 UT) capsule Take by mouth. 1/2 tablet     clobetasol (TEMOVATE) 0.05 % external solution Apply 1 Application topically to scalp daily as needed for flares. 50 mL 1   estradiol (ESTRACE) 0.1 MG/GM vaginal cream Use 1 application once a week 42.5 g 1   fluocinonide (LIDEX) 0.05 % external solution APPLY TO AFFECTED AREA(S) AT BEDTIME (Patient taking differently: Apply 1 application  topically See admin instructions. Use once or twice a week) 60 mL 0   Glucosamine-Chondroit-Vit C-Mn (GLUCOSAMINE-CHONDROITIN) CAPS Take 1 capsule by mouth daily.     ibuprofen (ADVIL) 200 MG tablet Take 200 mg by mouth every 6 (six) hours as needed.     Multiple Vitamin (MULTIVITAMIN) tablet Take 1 tablet  by mouth daily. Centrum Silver 50+     Omega-3 Fatty Acids (FISH OIL) 1000 MG CAPS Take 1 capsule by mouth daily.     telmisartan (MICARDIS) 40 MG tablet Take 1 tablet (40 mg total) by mouth daily. 90 tablet 1   tretinoin (RETIN-A) 0.05 % cream Apply 1 application topically every 30 (thirty) days. Apply to affected area as needed for Acne     Turmeric 500 MG CAPS Take 1 tablet by mouth as needed.     Abaloparatide (TYMLOS) 3120 MCG/1.56ML SOPN Inject subcutaneously daily as directed. (Patient not taking: Reported on 12/29/2023) 1 mL 5   Abaloparatide (TYMLOS) 3120 MCG/1.56ML SOPN Inject Subcutaneous daily 30 days (Patient not taking: Reported on 12/29/2023) 1 mL 5   No current facility-administered medications for this visit.    PHYSICAL EXAM: Vitals:   12/29/23 1303  BP: 122/64  Pulse: 86  SpO2: 97%  Weight: 120 lb (54.4 kg)  Height: 5\' 3"  (1.6 m)   Body mass index is 21.26 kg/m.  Wt Readings from Last 3 Encounters:  12/29/23 120 lb (54.4 kg)  07/16/23 121 lb 9.6 oz (55.2 kg)  06/26/23 120 lb 9.6 oz (54.7 kg)    General: Well developed, well nourished female in no apparent distress.  HEENT: AT/[place], no external lesions. Hearing intact to the spoken word Eyes: Conjunctiva clear and no icterus. Neck: Trachea midline, neck supple without appreciable thyromegaly or lymphadenopathy and no palpable thyroid nodules.  Anterior neck is scar present, not related to thyroid surgery. Lungs: Clear to auscultation, no wheeze. Respirations not labored Heart: S1S2, Regular in rate and rhythm. Abdomen: Soft, non tender, non distended Neurologic: Alert, oriented, normal speech, deep tendon biceps reflexes normal,  no gross focal neurological deficit Extremities: No pedal pitting edema, no tremors of outstretched hands. No spine tenderness Skin: Warm, color good.  Psychiatric: Does not appear depressed or anxious  PERTINENT HISTORIC LABORATORY AND IMAGING STUDIES:  All pertinent  laboratory results were reviewed. Please see HPI also for further details.   Lab Results  Component Value Date   ALKPHOS 51 07/16/2023   ALKPHOS 49 03/13/2023   ALKPHOS 73 12/30/2022     ASSESSMENT / PLAN  1. Age-related osteoporosis without current pathological fracture     Ms. Steveson is a 63 y.o. old female with osteoporosis based on DXA scan result in September 2024.  No history of fragility fractures.  She has history of lumbar fusion surgery L4-L5 for disc degeneration in March 2021 and left shoulder replacement March 2024.  Patient has multiple risk factors for osteoporosis, including age, postmenopausal status.  No other identifiable  risk factors for osteoporosis.  -DEXA scan in September 2024, bone mineral density at the bilateral hips are in osteopenia range however T-score at the lumbar spine -3.6, at L1 and L2, excluding L3 and L4 due to metallic hardware in the lower lumbar spine.  She had left shoulder surgery in March 2024, and reports that she has not been using her left hand as much for several months.  Taking into account of these clinical factors, may have contributed to have lower reading of bone mineral density than what she generally has in other bones.  This means her bone density is probably higher than what the DEXA scan report indicates.  -If we go by her bone mineral density/T-score report, she has severe osteoporosis.  I would like to double check with bone turnover marker for osteoporosis with CTX.  Reasonable level of CTX would be reassuring.   -We discussion that patient wants to take the medication as less as possible and at the same time that would still be effective, and not necessarily available highest level of treatment, by taking into account of side effects and benefits.  She wants to take into account of having strong family history of cardiovascular disease and side effects potential of different medications.  -Discussed about various osteoporosis  medications level including anabolic therapy and antiresorptive therapy.  Discussed different options, side effect profile and effectiveness.  Discussed the mechanism of action of these medications.  Plan: -I would like to complete the secondary workup for osteoporosis that includes checking parathyroid hormone, BMP with EGFR, phosphorus and albumin.  She had normal vitamin D level in October, will not check today. -Will check thyroid function test. -Will check protein electrophoresis. -Check 24-hour urine calcium and creatinine -I would also like to check bone turnover marker CTX, for the reason as mentioned above. -If these test results are in the acceptable range, she probably does not need anabolic therapy.  In that case, I will recommend and will likely plan Reclast annual injection.  Provided written hands out and I reviewed with the patient the potential complications of this treatment, including the rare but serious osteonecrosis of the jaw and atypical bone fractures. We also talk about the possibility of influenza-like symptoms after the infusion. The plan will be to treat with Reclast 5 mg IV once a year for at least 3 - 5 years.  After that we will evaluate for potential drug holiday.  -Recommend to continue current dose of calcium and vitamin D, discussed usual recommendation is  take in at least 1200 calcium daily, between diet and supplements and current vitamin D supplement.  -Discussed regular weight bearing exercises recommended as tolerated by the patient.  Discussed fall precaution.  Diagnoses and all orders for this visit:  Age-related osteoporosis without current pathological fracture -     Serum protein electrophoresis with reflex -     Parathyroid hormone, intact (no Ca) -     C-Terminal Telopeptide -     BASIC METABOLIC PANEL WITH GFR -     Phosphorus -     Albumin -     Cancel: Calcium, 24-Hour Urine with Creatinine; Future -     T4, free -     TSH -     Calcium,  24-Hour Urine with Creatinine    DISPOSITION Follow up in clinic in 6 months suggested.  All questions answered and patient verbalized understanding of the plan.  [place] Ailanie Ruttan, MD [facility] Endocrinology [facility] [phone]  [place] [place], [address] [place], [place] [address] Phone # [phone]  At least part of this note was generated using voice recognition software. Inadvertent word errors may have occurred, which were not recognized during the proofreading process.

## 2023-12-30 ENCOUNTER — Encounter: Payer: Self-pay | Admitting: Endocrinology

## 2024-01-02 LAB — PROTEIN ELECTROPHORESIS, SERUM, WITH REFLEX
Albumin ELP: 4.7 g/dL (ref 3.8–4.8)
Alpha 1: 0.2 g/dL (ref 0.2–0.3)
Alpha 2: 0.7 g/dL (ref 0.5–0.9)
Beta 2: 0.2 g/dL (ref 0.2–0.5)
Beta Globulin: 0.4 g/dL (ref 0.4–0.6)
Gamma Globulin: 0.9 g/dL (ref 0.8–1.7)
Total Protein: 7.1 g/dL (ref 6.1–8.1)

## 2024-01-02 LAB — T4, FREE: Free T4: 1.2 ng/dL (ref 0.8–1.8)

## 2024-01-02 LAB — BASIC METABOLIC PANEL WITH GFR
BUN: 19 mg/dL (ref 7–25)
CO2: 27 mmol/L (ref 20–32)
Calcium: 10 mg/dL (ref 8.6–10.4)
Chloride: 104 mmol/L (ref 98–110)
Creat: 0.74 mg/dL (ref 0.50–1.05)
Glucose, Bld: 86 mg/dL (ref 65–139)
Potassium: 4.5 mmol/L (ref 3.5–5.3)
Sodium: 141 mmol/L (ref 135–146)

## 2024-01-02 LAB — TSH: TSH: 1.68 m[IU]/L (ref 0.40–4.50)

## 2024-01-02 LAB — ALBUMIN: Albumin: 4.9 g/dL (ref 3.6–5.1)

## 2024-01-02 LAB — PARATHYROID HORMONE, INTACT (NO CA): PTH: 37 pg/mL (ref 16–77)

## 2024-01-02 LAB — PHOSPHORUS: Phosphorus: 4.1 mg/dL (ref 2.5–4.5)

## 2024-01-02 LAB — C-TERMINAL TELOPEPTIDE: C-Telopeptide (CTx): 268 pg/mL

## 2024-01-04 ENCOUNTER — Other Ambulatory Visit

## 2024-01-04 DIAGNOSIS — M810 Age-related osteoporosis without current pathological fracture: Secondary | ICD-10-CM | POA: Diagnosis not present

## 2024-01-04 NOTE — Telephone Encounter (Signed)
 That was typo, I mean to say 24 hr urine calcium and CTX awaited.

## 2024-01-05 LAB — CALCIUM, 24-HOUR URINE WITH CREATININE
CALCIUM/CREATININE RATIO: 333 mg/g{creat} — ABNORMAL HIGH (ref 30–275)
Calcium, 24H Urine: 270 mg/(24.h) — ABNORMAL HIGH
Creatinine, 24H Ur: 0.81 g/(24.h) (ref 0.50–2.15)

## 2024-01-06 ENCOUNTER — Other Ambulatory Visit: Payer: Self-pay

## 2024-01-06 ENCOUNTER — Other Ambulatory Visit: Payer: Self-pay | Admitting: Pharmacist

## 2024-01-06 MED ORDER — TYMLOS 3120 MCG/1.56ML ~~LOC~~ SOPN
PEN_INJECTOR | SUBCUTANEOUS | 5 refills | Status: DC
  Filled 2024-01-06: qty 1.56, fill #0

## 2024-01-06 NOTE — Progress Notes (Signed)
 Pharmacy Patient Advocate Encounter  Insurance verification completed.   The patient is insured through [facility]   Ran test claim for Tymlos. Co-pay is $0. Patient has copay card.   This test claim was processed through [facility]- copay amounts may vary at other pharmacies due to pharmacy/plan contracts, or as the patient moves through the different stages of their insurance plan.

## 2024-01-07 ENCOUNTER — Other Ambulatory Visit: Payer: Self-pay

## 2024-01-07 ENCOUNTER — Other Ambulatory Visit (HOSPITAL_COMMUNITY): Payer: Self-pay

## 2024-01-11 ENCOUNTER — Other Ambulatory Visit: Payer: Self-pay

## 2024-01-11 NOTE — Progress Notes (Signed)
 After speaking with Endocrinologist patient will not be getting Tymlos. Dis-enrolling.

## 2024-01-12 ENCOUNTER — Other Ambulatory Visit: Payer: Self-pay

## 2024-01-12 ENCOUNTER — Encounter: Payer: Self-pay | Admitting: Internal Medicine

## 2024-01-12 ENCOUNTER — Ambulatory Visit: Payer: 59 | Admitting: Internal Medicine

## 2024-01-12 VITALS — BP 128/70 | HR 90 | Temp 98.0°F | Resp 16 | Ht 63.0 in | Wt 120.0 lb

## 2024-01-12 DIAGNOSIS — I10 Essential (primary) hypertension: Secondary | ICD-10-CM

## 2024-01-12 DIAGNOSIS — M810 Age-related osteoporosis without current pathological fracture: Secondary | ICD-10-CM

## 2024-01-12 DIAGNOSIS — M0600 Rheumatoid arthritis without rheumatoid factor, unspecified site: Secondary | ICD-10-CM

## 2024-01-12 DIAGNOSIS — G4733 Obstructive sleep apnea (adult) (pediatric): Secondary | ICD-10-CM

## 2024-01-12 DIAGNOSIS — E7800 Pure hypercholesterolemia, unspecified: Secondary | ICD-10-CM

## 2024-01-12 MED ORDER — TELMISARTAN 40 MG PO TABS
40.0000 mg | ORAL_TABLET | Freq: Every day | ORAL | 1 refills | Status: DC
  Filled 2024-01-12 – 2024-02-08 (×2): qty 90, 90d supply, fill #0
  Filled 2024-04-22: qty 90, 90d supply, fill #1

## 2024-01-12 MED ORDER — ESTRADIOL 0.1 MG/GM VA CREA
1.0000 | TOPICAL_CREAM | VAGINAL | 1 refills | Status: DC
  Filled 2024-01-12: qty 42.5, 90d supply, fill #0

## 2024-01-12 NOTE — Assessment & Plan Note (Signed)
 Discussed recent endocrinology visit. Discussed treatment options. She had questions about oral bisphosphonates vs IV reclast. Discussed. She will think about her options and will contact endocrinology regarding treatment.

## 2024-01-12 NOTE — Assessment & Plan Note (Signed)
 Seeing rheumatology.  Doing exercise. Follow. Stable.

## 2024-01-12 NOTE — Progress Notes (Signed)
 Subjective:    [name], female    DOB: 06-Mar-1961, 63 y.o.   MRN: [id number]  Patient here for  Chief Complaint  Patient presents with   Medical Management of Chronic Issues    HPI Here for a scheduled follow up. Here to follow up regarding hypercholesterolemia, sleep apnea and hypertension. Continues on micardis. Is s/p left shoulder arthroplasty and PT. Saw Dr Logan Bores 05/22/23 - hallux valgus bilateral and pes planovalgus deformity (bilateral). Recommended orthotics/toe splint. Saw rheumatology 05/2023 - seronegative RA. Suggested trial of hydroxychloroquine. Did not tolerate. Had f/u 10/29/23 - stable. Hold on further medication. Continues cpap. Saw endocriology 12/29/23 - to discuss recent bone density and treatment. Discussed tymlos and reclast. Reviewed labs. Discussed treatments. Discussed oral bisphosphonates and reclast. Discussed possible side effects. She is concerned about IV reclast - given yearly dosing. She has adjust her diet/caffeine intake. Also exercising.    Past Medical History:  Diagnosis Date   Arthritis    Complication of anesthesia    pt states she is very sensitive to most drugs, usually needs 1/2 of whatever other people get. She states as a child/teenager she had difficulty waking up after surgery due to the sensitivity that she has to drugs.   Eczema    Frequent UTI    H/O lymphadenopathy    With previous submandibular node removals.   Hypertension    Recurrent sinus infections    Sleep apnea    uses c-pap   Past Surgical History:  Procedure Laterality Date   COLONOSCOPY WITH PROPOFOL N/A 08/25/2017   Procedure: COLONOSCOPY WITH PROPOFOL;  Surgeon: Midge Minium, MD;  Location: [facility];  Service: Endoscopy;  Laterality: N/A;   COLONOSCOPY WITH PROPOFOL N/A 10/17/2022   Procedure: COLONOSCOPY WITH PROPOFOL;  Surgeon: Midge Minium, MD;  Location: [facility];  Service: Endoscopy;  Laterality: N/A;   LUMBAR FUSION   12/26/2019   l4-l5   Lymph Node Removal  1984   NECK SURGERY  1977   H/O Right neck surgery secondary to a benign growth with when patient was 32 or 63 yo   skin lesion extraction  2013   basal cell - Dr Cheree Ditto   TOTAL SHOULDER ARTHROPLASTY Left 12/18/2022   Procedure: TOTAL SHOULDER ARTHROPLASTY;  Surgeon: Bjorn Pippin, MD;  Location: [facility];  Service: Orthopedics;  Laterality: Left;   Uterine Polyps Removed  2004   Family History  Problem Relation Age of Onset   Hyperlipidemia Mother    Thyroid disease Mother    Heart disease Mother        Heart Valve problems   Coronary artery disease Father        Stents placed   Hypertension Other        uncle   Parkinson's disease Paternal Uncle    Parkinson's disease Maternal Grandmother    Heart disease Maternal Grandfather    Heart disease Paternal Grandfather        MI   Breast cancer Neg Hx    Social History   Socioeconomic History   Marital status: Divorced    Spouse name: Not on file   Number of children: 2   Years of education: Not on file   Highest education level: Not on file  Occupational History    Employer: [employer]  Tobacco Use   Smoking status: Never   Smokeless tobacco: Never  Vaping Use   Vaping status: Never Used  Substance and Sexual Activity  Alcohol use: Yes    Comment: Occasional   Drug use: No   Sexual activity: Not on file  Other Topics Concern   Not on file  Social History Narrative   Not on file   Social Drivers of Health   Financial Resource Strain: Not on file  Food Insecurity: Not on file  Transportation Needs: Not on file  Physical Activity: Not on file  Stress: Not on file  Social Connections: Not on file     Review of Systems  Constitutional:  Negative for appetite change and unexpected weight change.  HENT:  Negative for congestion and sinus pressure.   Respiratory:  Negative for cough, chest tightness and shortness of breath.   Cardiovascular:  Negative for  chest pain, palpitations and leg swelling.  Gastrointestinal:  Negative for abdominal pain, diarrhea, nausea and vomiting.  Genitourinary:  Negative for difficulty urinating and dysuria.  Musculoskeletal:  Negative for myalgias.  Skin:  Negative for color change and rash.  Neurological:  Negative for dizziness and headaches.  Psychiatric/Behavioral:  Negative for agitation and dysphoric mood.        Objective:     BP 128/70   Pulse 90   Temp 98 F (36.7 C)   Resp 16   Ht 5\' 3"  (1.6 m)   Wt 120 lb (54.4 kg)   LMP 12/01/2008   SpO2 98%   BMI 21.26 kg/m  Wt Readings from Last 3 Encounters:  01/12/24 120 lb (54.4 kg)  12/29/23 120 lb (54.4 kg)  07/16/23 121 lb 9.6 oz (55.2 kg)    Physical Exam Vitals reviewed.  Constitutional:      General: She is not in acute distress.    Appearance: Normal appearance.  HENT:     Head: Normocephalic and atraumatic.     Right Ear: External ear normal.     Left Ear: External ear normal.     Mouth/Throat:     Pharynx: No oropharyngeal exudate or posterior oropharyngeal erythema.  Eyes:     General: No scleral icterus.       Right eye: No discharge.        Left eye: No discharge.     Conjunctiva/sclera: Conjunctivae normal.  Neck:     Thyroid: No thyromegaly.  Cardiovascular:     Rate and Rhythm: Normal rate and regular rhythm.  Pulmonary:     Effort: No respiratory distress.     Breath sounds: Normal breath sounds. No wheezing.  Abdominal:     General: Bowel sounds are normal.     Palpations: Abdomen is soft.     Tenderness: There is no abdominal tenderness.  Musculoskeletal:        General: No swelling or tenderness.     Cervical back: Neck supple. No tenderness.  Lymphadenopathy:     Cervical: No cervical adenopathy.  Skin:    Findings: No erythema or rash.  Neurological:     Mental Status: She is alert.  Psychiatric:        Mood and Affect: Mood normal.        Behavior: Behavior normal.         Outpatient  Encounter Medications as of 01/12/2024  Medication Sig   Acetaminophen Extra Strength 500 MG CAPS Take 2 tablets (1,000 mg) by mouth every 8 (eight) hours as needed.   Blood Pressure Monitor MISC Check BP regularly.   Calcium Carb-Cholecalciferol (CALCIUM-VITAMIN D) 600-400 MG-UNIT TABS Take 1 tablet by mouth 3 (three) times a week.  celecoxib (CELEBREX) 100 MG capsule Take 1 capsule (100 mg total) by mouth daily.   Cholecalciferol 25 MCG (1000 UT) capsule Take by mouth. 1/2 tablet   clobetasol (TEMOVATE) 0.05 % external solution Apply 1 Application topically to scalp daily as needed for flares.   estradiol (ESTRACE) 0.1 MG/GM vaginal cream Place 1 Applicatorful vaginally once a week.   fluocinonide (LIDEX) 0.05 % external solution APPLY TO AFFECTED AREA(S) AT BEDTIME (Patient taking differently: Apply 1 application  topically See admin instructions. Use once or twice a week)   Glucosamine-Chondroit-Vit C-Mn (GLUCOSAMINE-CHONDROITIN) CAPS Take 1 capsule by mouth daily.   ibuprofen (ADVIL) 200 MG tablet Take 200 mg by mouth every 6 (six) hours as needed.   Multiple Vitamin (MULTIVITAMIN) tablet Take 1 tablet by mouth daily. Centrum Silver 50+   Omega-3 Fatty Acids (FISH OIL) 1000 MG CAPS Take 1 capsule by mouth daily.   telmisartan (MICARDIS) 40 MG tablet Take 1 tablet (40 mg total) by mouth daily.   tretinoin (RETIN-A) 0.05 % cream Apply 1 application topically every 30 (thirty) days. Apply to affected area as needed for Acne   Turmeric 500 MG CAPS Take 1 tablet by mouth as needed.   [DISCONTINUED] Abaloparatide (TYMLOS) 3120 MCG/1.56ML SOPN Inject Subcutaneous daily 30 days   [DISCONTINUED] estradiol (ESTRACE) 0.1 MG/GM vaginal cream Use 1 application once a week   [DISCONTINUED] telmisartan (MICARDIS) 40 MG tablet Take 1 tablet (40 mg total) by mouth daily.   No facility-administered encounter medications on file as of 01/12/2024.     Lab Results  Component Value Date   WBC 4.7  07/16/2023   HGB 12.5 07/16/2023   HCT 38.5 07/16/2023   PLT 306.0 07/16/2023   GLUCOSE 86 12/29/2023   CHOL 205 (H) 07/16/2023   TRIG 114.0 07/16/2023   HDL 71.60 07/16/2023   LDLDIRECT 148.9 08/15/2013   LDLCALC 111 (H) 07/16/2023   ALT 23 07/16/2023   AST 25 07/16/2023   NA 141 12/29/2023   K 4.5 12/29/2023   CL 104 12/29/2023   CREATININE 0.74 12/29/2023   BUN 19 12/29/2023   CO2 27 12/29/2023   TSH 1.68 12/29/2023    DG Bone Density Result Date: 07/09/2023 EXAM: DUAL X-RAY ABSORPTIOMETRY (DXA) FOR BONE MINERAL DENSITY IMPRESSION: Patient:             Kahla, Risdon Birth Date:          11-29-1960    [age] Height / Weight:     63.0 in.    122.0 lbs. Sex / Ethnic:        Female    White Facility ID: Referring Physician: Dale Pyote Measured:            07/09/2023 2:49:04 PM (14.10) Analyzed:            07/09/2023 3:02:48 PM (14.10) Dual[facility]em[facility] Bone Density Trend BMD     Young-Adult Age-Matched Region       (g/cm2) T-score     Z-score     WHO Classification Neck Left       0.804   -1.7        -0.4        Osteopenia Right      0.753   -2.0        -0.7        Osteopenia Mean       0.779   -1.9        -0.6  Osteopenia Difference 0.051   -0.4        -0.4         - Total Left       0.843   -1.3        -0.3        Osteopenia Right      0.778   -1.8        -0.8        Osteopenia Mean       0.810   -1.6        -0.5        Osteopenia Difference 0.064   -0.5        -0.5         - Hip Axis Length Comparison (mm) (Right = 98.2 mm)  (Mean = 104.0 mm)  (Left = 97.2 mm) Trend: Neck Right Change vs Change vs Measured   Age     BMD     Previous  Previous Date       (years) (g/cm2) (g/cm2)   (%) 07/09/2023 61.8    0.753   -0.189*   -20.1* 12/02/2005 44.2    0.942   -         - Your patient Amalia Edgecombe completed a BMD test on 07/09/2023 using the [employer] DXA System (software version: 14.10) manufactured by [employer]. The following summarizes the results of our evaluation.  Technologist:VLM PATIENT BIOGRAPHICAL: Name: Vicki, Chaffin [name]: [id number] Birth Date: 15-Aug-1961 Height: 63.0 in. Gender: Female Exam Date: 07/09/2023 Weight: 122.0 lbs. Indications: Caucasian, family hx hip fracture, Postmenopausal, Rheumatoid Arthritis Fractures: Treatments: calcium w/ vit D, Estradiol, hydrochloroquine DENSITOMETRY RESULTS: Site         Region     Measured Date Measured Age WHO Classification Young Adult T-score BMD         %Change vs. Previous Significant Change (*) AP Spine L1-L2 07/09/2023 61.8 Osteoporosis -3.6 0.742 g/cm2 -28.2% Yes AP Spine L1-L2 12/02/2005 44.2 Osteopenia -1.2 1.034 g/cm2 - - Left Forearm Radius 33% 07/09/2023 61.8 Osteoporosis -4.0 0.526 g/cm2 - - [facility]em[facility] Neck Right 07/09/2023 61.8 Osteopenia -2.0 0.753 g/cm2 -20.1% Yes [facility] Neck Right 12/02/2005 44.2 Normal -0.7 0.942 g/cm2 - - ASSESSMENT: The BMD measured at Forearm Radius 33% is 0.526 g/cm2 with a T-score of -4.0. This patient is considered osteoporotic according to World Health Organization [facility]) criteria. L-3 & L-4 were excluded due to surgical hardware. Compared with prior study, there has been significant decrease in the spine. Compared with prior study, there has been significant decrease in the total hip. The scan quality is good. World Science writer [facility]) criteria for post-menopausal, Caucasian Women: Normal:                   T-score at or above -1 SD Osteopenia/low bone mass: T-score between -1 and -2.5 SD Osteoporosis:             T-score at or below -2.5 SD RECOMMENDATIONS: 1. All patients should optimize calcium and vitamin D intake. 2. Consider FDA-approved medical therapies in postmenopausal women and men aged 64 years and older, based on the following: a. A hip or vertebral(clinical or morphometric) fracture b. T-score < -2.5 at the femoral neck or spine after appropriate evaluation to exclude secondary causes c. Low bone mass (T-score between -1.0 and -2.5 at the femoral neck or  spine) and a 10-year probability of a hip fracture > 3% or a 10-year probability of a major osteoporosis-related  fracture > 20% based on the [place]-adapted WHO algorithm 3. Clinician judgment and/or patient preferences may indicate treatment for people with 10-year fracture probabilities above or below these levels FOLLOW-UP: People with diagnosed cases of osteoporosis or at high risk for fracture should have regular bone mineral density tests. For patients eligible for Medicare, routine testing is allowed once every 2 years. The testing frequency can be increased to one year for patients who have rapidly progressing disease, those who are receiving or discontinuing medical therapy to restore bone mass, or have additional risk factors. I have reviewed this report, and agree with the above findings. [facility] Electronically Signed   By: Baird Lyons M.D.   On: 07/09/2023 15:12       Assessment & Plan:  Essential hypertension, benign Assessment & Plan: On micardis 40mg  q day.  Blood pressures as outlined.  Follow pressures.  Follow metabolic panel.     Hypercholesterolemia Assessment & Plan: The 10-year ASCVD risk score (Arnett DK, et al., 2019) is: 4.8%   Values used to calculate the score:     Age: 58 years     Sex: Female     Is Non-Hispanic African American: No     Diabetic: No     Tobacco smoker: No     Systolic Blood Pressure: 128 mmHg     Is BP treated: Yes     HDL Cholesterol: 71.6 mg/dL     Total Cholesterol: 205 mg/dL  Low cholesterol diet and exercise.  Follow lipid panel. Will check with future labs.   Orders: -     Basic metabolic panel with GFR; Future -     Hepatic function panel; Future -     Lipid panel; Future  Osteoporosis, unspecified osteoporosis type, unspecified pathological fracture presence Assessment & Plan: Discussed recent endocrinology visit. Discussed treatment options. She had questions about oral bisphosphonates vs IV reclast. Discussed. She  will think about her options and will contact endocrinology regarding treatment.    Seronegative rheumatoid arthritis [facility]) Assessment & Plan: Seeing rheumatology.  Doing exercise. Follow. Stable.    Obstructive sleep apnea syndrome Assessment & Plan: Saw pulmonary 01/30/23 - f/u sleep apnea.  Per note, compliant with cpap. (Pressure 5-13).f/u 06/2023 - compliant. Continue cpap.    Other orders -     Estradiol; Place 1 Applicatorful vaginally once a week.  Dispense: 42.5 g; Refill: 1 -     Telmisartan; Take 1 tablet (40 mg total) by mouth daily.  Dispense: 90 tablet; Refill: 1     Dale [place], MD

## 2024-01-12 NOTE — Assessment & Plan Note (Signed)
 The 10-year ASCVD risk score (Arnett DK, et al., 2019) is: 4.8%   Values used to calculate the score:     Age: 63 years     Sex: Female     Is Non-Hispanic African American: No     Diabetic: No     Tobacco smoker: No     Systolic Blood Pressure: 128 mmHg     Is BP treated: Yes     HDL Cholesterol: 71.6 mg/dL     Total Cholesterol: 205 mg/dL  Low cholesterol diet and exercise.  Follow lipid panel. Will check with future labs.

## 2024-01-12 NOTE — Assessment & Plan Note (Signed)
On micardis 40mg  q day.  Blood pressures as outlined.  Follow pressures.  Follow metabolic panel.

## 2024-01-12 NOTE — Assessment & Plan Note (Signed)
 Saw pulmonary 01/30/23 - f/u sleep apnea.  Per note, compliant with cpap. (Pressure 5-13).f/u 06/2023 - compliant. Continue cpap.

## 2024-01-15 NOTE — Telephone Encounter (Signed)
 Noted.

## 2024-01-18 ENCOUNTER — Other Ambulatory Visit (HOSPITAL_COMMUNITY): Payer: Self-pay

## 2024-01-18 ENCOUNTER — Telehealth: Payer: Self-pay

## 2024-01-18 ENCOUNTER — Other Ambulatory Visit: Payer: Self-pay

## 2024-01-18 MED ORDER — ALENDRONATE SODIUM 70 MG PO TABS
70.0000 mg | ORAL_TABLET | ORAL | 11 refills | Status: DC
  Filled 2024-01-18: qty 4, 28d supply, fill #0
  Filled 2024-07-22: qty 4, 28d supply, fill #1
  Filled 2024-08-23: qty 4, 28d supply, fill #2
  Filled 2024-09-16: qty 4, 28d supply, fill #3
  Filled 2024-11-10: qty 4, 28d supply, fill #0

## 2024-01-18 NOTE — Telephone Encounter (Signed)
 Copied from CRM [id number]. Topic: General - Other >> Jan 18, 2024 11:02 AM Pascal Lux wrote: Reason for CRM: Patient stated she wants to leave a message for her provider that she would like to take the Fosamax oral medication that they spoke about. Would like a follow up.

## 2024-01-18 NOTE — Telephone Encounter (Signed)
 Rx sent in to [facility] pharmacy.

## 2024-01-18 NOTE — Addendum Note (Signed)
 Addended by: Charm Barges on: 01/18/2024 07:32 PM   Modules accepted: Orders

## 2024-01-18 NOTE — Telephone Encounter (Signed)
 Ok to send in.

## 2024-01-19 ENCOUNTER — Other Ambulatory Visit: Payer: Self-pay

## 2024-01-19 DIAGNOSIS — G4733 Obstructive sleep apnea (adult) (pediatric): Secondary | ICD-10-CM | POA: Diagnosis not present

## 2024-01-19 NOTE — Telephone Encounter (Signed)
 Left detailed message for patient to let her know that her medication was sent to [facility]

## 2024-02-08 ENCOUNTER — Other Ambulatory Visit: Payer: Self-pay

## 2024-02-11 ENCOUNTER — Other Ambulatory Visit: Payer: Self-pay

## 2024-02-18 DIAGNOSIS — G4733 Obstructive sleep apnea (adult) (pediatric): Secondary | ICD-10-CM | POA: Diagnosis not present

## 2024-02-24 ENCOUNTER — Encounter: Payer: Self-pay | Admitting: Internal Medicine

## 2024-02-25 NOTE — Telephone Encounter (Signed)
 Please clarify what problems she is having. Confirm she has not had a hysterectomy. If she changes to hormone replacement therapy and has not had a hysterectomy, would need estrogen and progesterone.

## 2024-02-26 ENCOUNTER — Other Ambulatory Visit: Payer: Self-pay | Admitting: Internal Medicine

## 2024-02-26 ENCOUNTER — Other Ambulatory Visit: Payer: Self-pay

## 2024-02-26 NOTE — Telephone Encounter (Signed)
 Noted. Appears wanted to discuss at her appt. Let me know if feels needs anything before her appt

## 2024-02-26 NOTE — Telephone Encounter (Signed)
 Message sent to Dr Geralyn Knee to confirm ok to refill.

## 2024-02-26 NOTE — Telephone Encounter (Signed)
 Are you ok with filling celebrex ? She uses 4-5 days per week.

## 2024-02-27 MED ORDER — CELECOXIB 100 MG PO CAPS
100.0000 mg | ORAL_CAPSULE | Freq: Every day | ORAL | 0 refills | Status: DC
  Filled 2024-02-27: qty 30, 30d supply, fill #0

## 2024-02-27 NOTE — Telephone Encounter (Signed)
 Celebrex refilled

## 2024-02-28 ENCOUNTER — Other Ambulatory Visit: Payer: Self-pay

## 2024-03-14 ENCOUNTER — Ambulatory Visit: Payer: Self-pay | Admitting: Internal Medicine

## 2024-03-14 ENCOUNTER — Other Ambulatory Visit (INDEPENDENT_AMBULATORY_CARE_PROVIDER_SITE_OTHER)

## 2024-03-14 DIAGNOSIS — E7800 Pure hypercholesterolemia, unspecified: Secondary | ICD-10-CM

## 2024-03-14 DIAGNOSIS — R17 Unspecified jaundice: Secondary | ICD-10-CM

## 2024-03-14 LAB — LIPID PANEL
Cholesterol: 218 mg/dL — ABNORMAL HIGH (ref 0–200)
HDL: 71.9 mg/dL (ref >39.00)
LDL Cholesterol: 131 mg/dL — ABNORMAL HIGH (ref 0–99)
NonHDL: 146.18
Total CHOL/HDL Ratio: 3
Triglycerides: 76 mg/dL (ref 0.0–149.0)
VLDL: 15.2 mg/dL (ref 0.0–40.0)

## 2024-03-14 LAB — BASIC METABOLIC PANEL WITH GFR
BUN: 13 mg/dL (ref 6–23)
CO2: 31 meq/L (ref 19–32)
Calcium: 9.7 mg/dL (ref 8.4–10.5)
Chloride: 101 meq/L (ref 96–112)
Creatinine, Ser: 0.75 mg/dL (ref 0.40–1.20)
GFR: 85.23 mL/min (ref >60.00)
Glucose, Bld: 90 mg/dL (ref 70–99)
Potassium: 4.1 meq/L (ref 3.5–5.1)
Sodium: 138 meq/L (ref 135–145)

## 2024-03-14 LAB — HEPATIC FUNCTION PANEL
ALT: 24 U/L (ref 0–35)
AST: 22 U/L (ref 0–37)
Albumin: 4.5 g/dL (ref 3.5–5.2)
Alkaline Phosphatase: 52 U/L (ref 39–117)
Bilirubin, Direct: 0.2 mg/dL (ref 0.0–0.3)
Total Bilirubin: 1.3 mg/dL — ABNORMAL HIGH (ref 0.2–1.2)
Total Protein: 6.9 g/dL (ref 6.0–8.3)

## 2024-03-14 NOTE — Addendum Note (Signed)
 Addended by: Thressa Flora D on: 03/14/2024 07:59 AM   Modules accepted: Orders

## 2024-03-17 ENCOUNTER — Telehealth: Payer: Self-pay

## 2024-03-17 ENCOUNTER — Other Ambulatory Visit: Payer: Self-pay

## 2024-03-17 ENCOUNTER — Encounter: Payer: Self-pay | Admitting: Internal Medicine

## 2024-03-17 ENCOUNTER — Other Ambulatory Visit (HOSPITAL_COMMUNITY)
Admission: RE | Admit: 2024-03-17 | Discharge: 2024-03-17 | Disposition: A | Discharge status: Home / Self Care | Source: Ambulatory Visit | Attending: Internal Medicine | Admitting: Internal Medicine

## 2024-03-17 ENCOUNTER — Ambulatory Visit (INDEPENDENT_AMBULATORY_CARE_PROVIDER_SITE_OTHER): Admitting: Internal Medicine

## 2024-03-17 VITALS — BP 110/68 | HR 78 | Temp 98.0°F | Resp 16 | Ht 63.0 in | Wt 120.6 lb

## 2024-03-17 DIAGNOSIS — Z124 Encounter for screening for malignant neoplasm of cervix: Secondary | ICD-10-CM | POA: Diagnosis not present

## 2024-03-17 DIAGNOSIS — M0600 Rheumatoid arthritis without rheumatoid factor, unspecified site: Secondary | ICD-10-CM | POA: Diagnosis not present

## 2024-03-17 DIAGNOSIS — I10 Essential (primary) hypertension: Secondary | ICD-10-CM

## 2024-03-17 DIAGNOSIS — Z1231 Encounter for screening mammogram for malignant neoplasm of breast: Secondary | ICD-10-CM

## 2024-03-17 DIAGNOSIS — G4733 Obstructive sleep apnea (adult) (pediatric): Secondary | ICD-10-CM | POA: Diagnosis not present

## 2024-03-17 DIAGNOSIS — M810 Age-related osteoporosis without current pathological fracture: Secondary | ICD-10-CM | POA: Diagnosis not present

## 2024-03-17 DIAGNOSIS — Z0000 Encounter for general adult medical examination without abnormal findings: Secondary | ICD-10-CM | POA: Diagnosis not present

## 2024-03-17 DIAGNOSIS — E7800 Pure hypercholesterolemia, unspecified: Secondary | ICD-10-CM

## 2024-03-17 MED ORDER — HYDROCORTISONE (PERIANAL) 2.5 % EX CREA
1.0000 | TOPICAL_CREAM | Freq: Three times a day (TID) | CUTANEOUS | 0 refills | Status: AC
  Filled 2024-03-17: qty 30, 10d supply, fill #0
  Filled 2024-03-17: qty 30, 7d supply, fill #0

## 2024-03-17 NOTE — Telephone Encounter (Signed)
 Left detailed message for patient.

## 2024-03-17 NOTE — Progress Notes (Signed)
 Subjective:    [name], female    DOB: Aug 30, 1961, 63 y.o.   MRN: [id number]  Patient here for  Chief Complaint  Patient presents with   Annual Exam    HPI Here for a physical exam. History of hypercholesterolemia, sleep apnea and hypertension. Continues on micardis . Is s/p left shoulder arthroplasty and PT. Saw Dr Luster Salters 05/22/23 - hallux valgus bilateral and pes planovalgus deformity (bilateral). Recommended orthotics/toe splint. Saw rheumatology 05/2023 - seronegative RA. Suggested trial of hydroxychloroquine . Did not tolerate. Had f/u 10/29/23 - stable. Hold on further medication. Continues cpap. Saw endocriology 12/29/23 - to discuss recent bone density and treatment. Discussed tymlos  and reclast. Last visit, wanted to think about therapy.  Discussed with her today.  She plans to start Fosamax .  She reports she has been doing well.  Exercises.  No chest pain or shortness of breath reported.  Blood pressures doing well.  Using vaginal estrogen.  Had questions about systemic estrogen.  Discussed hormone replacement therapy.  Will think about this and notify me if she desires to start.  Discussed labs.   Past Medical History:  Diagnosis Date   Arthritis    Complication of anesthesia    pt states she is very sensitive to most drugs, usually needs 1/2 of whatever other people get. She states as a child/teenager she had difficulty waking up after surgery due to the sensitivity that she has to drugs.   Eczema    Frequent UTI    H/O lymphadenopathy    With previous submandibular node removals.   Hypertension    Recurrent sinus infections    Sleep apnea    uses c-pap   Past Surgical History:  Procedure Laterality Date   COLONOSCOPY WITH PROPOFOL  N/A 08/25/2017   Procedure: COLONOSCOPY WITH PROPOFOL ;  Surgeon: Marnee Sink, MD;  Location: [facility];  Service: Endoscopy;  Laterality: N/A;   COLONOSCOPY WITH PROPOFOL  N/A 10/17/2022   Procedure: COLONOSCOPY WITH  PROPOFOL ;  Surgeon: Marnee Sink, MD;  Location: [facility];  Service: Endoscopy;  Laterality: N/A;   LUMBAR FUSION  12/26/2019   l4-l5   Lymph Node Removal  1984   NECK SURGERY  1977   H/O Right neck surgery secondary to a benign growth with when patient was 37 or 63 yo   skin lesion extraction  2013   basal cell - Dr Tyrone Gallop   TOTAL SHOULDER ARTHROPLASTY Left 12/18/2022   Procedure: TOTAL SHOULDER ARTHROPLASTY;  Surgeon: Micheline Ahr, MD;  Location: [facility];  Service: Orthopedics;  Laterality: Left;   Uterine Polyps Removed  2004   Family History  Problem Relation Age of Onset   Hyperlipidemia Mother    Thyroid  disease Mother    Heart disease Mother        Heart Valve problems   Coronary artery disease Father        Stents placed   Hypertension Other        uncle   Parkinson's disease Paternal Uncle    Parkinson's disease Maternal Grandmother    Heart disease Maternal Grandfather    Heart disease Paternal Grandfather        MI   Breast cancer Neg Hx    Social History   Socioeconomic History   Marital status: Divorced    Spouse name: Not on file   Number of children: 2   Years of education: Not on file   Highest education level: Not on file  Occupational History  Employer: [employer]  Tobacco Use   Smoking status: Never   Smokeless tobacco: Never  Vaping Use   Vaping status: Never Used  Substance and Sexual Activity   Alcohol use: Yes    Comment: Occasional   Drug use: No   Sexual activity: Not on file  Other Topics Concern   Not on file  Social History Narrative   Not on file   Social Drivers of Health   Financial Resource Strain: Not on file  Food Insecurity: Not on file  Transportation Needs: Not on file  Physical Activity: Not on file  Stress: Not on file  Social Connections: Not on file     Review of Systems  Constitutional:  Negative for appetite change and unexpected weight change.  HENT:  Negative for congestion, sinus  pressure and sore throat.   Eyes:  Negative for pain and visual disturbance.  Respiratory:  Negative for cough, chest tightness and shortness of breath.   Cardiovascular:  Negative for chest pain, palpitations and leg swelling.  Gastrointestinal:  Negative for abdominal pain, diarrhea, nausea and vomiting.  Genitourinary:  Negative for difficulty urinating and dysuria.  Musculoskeletal:  Negative for joint swelling and myalgias.  Skin:  Negative for color change and rash.  Neurological:  Negative for dizziness and headaches.  Hematological:  Negative for adenopathy. Does not bruise/bleed easily.  Psychiatric/Behavioral:  Negative for agitation and dysphoric mood.        Objective:     BP 110/68   Pulse 78   Temp 98 F (36.7 C)   Resp 16   Ht 5\' 3"  (1.6 m)   Wt 120 lb 9.6 oz (54.7 kg)   LMP 12/01/2008   SpO2 99%   BMI 21.36 kg/m  Wt Readings from Last 3 Encounters:  03/17/24 120 lb 9.6 oz (54.7 kg)  01/12/24 120 lb (54.4 kg)  12/29/23 120 lb (54.4 kg)    Physical Exam Vitals reviewed.  Constitutional:      General: She is not in acute distress.    Appearance: Normal appearance. She is well-developed.  HENT:     Head: Normocephalic and atraumatic.     Right Ear: External ear normal.     Left Ear: External ear normal.     Mouth/Throat:     Pharynx: No oropharyngeal exudate or posterior oropharyngeal erythema.  Eyes:     General: No scleral icterus.       Right eye: No discharge.        Left eye: No discharge.     Conjunctiva/sclera: Conjunctivae normal.  Neck:     Thyroid : No thyromegaly.  Cardiovascular:     Rate and Rhythm: Normal rate and regular rhythm.  Pulmonary:     Effort: No tachypnea, accessory muscle usage or respiratory distress.     Breath sounds: Normal breath sounds. No decreased breath sounds or wheezing.  Chest:  Breasts:    Right: No inverted nipple, mass, nipple discharge or tenderness (no axillary adenopathy).     Left: No inverted nipple,  mass, nipple discharge or tenderness (no axilarry adenopathy).  Abdominal:     General: Bowel sounds are normal.     Palpations: Abdomen is soft.     Tenderness: There is no abdominal tenderness.  Genitourinary:    Comments: Normal external genitalia.  Vaginal vault without lesions.  Cervix identified.  Pap smear performed.  Could not appreciate any adnexal masses or tenderness.   Musculoskeletal:        General: No swelling  or tenderness.     Cervical back: Neck supple.  Lymphadenopathy:     Cervical: No cervical adenopathy.  Skin:    Findings: No erythema or rash.  Neurological:     Mental Status: She is alert and oriented to person, place, and time.  Psychiatric:        Mood and Affect: Mood normal.        Behavior: Behavior normal.         Outpatient Encounter Medications as of 03/17/2024  Medication Sig   Acetaminophen  Extra Strength 500 MG CAPS Take 2 tablets (1,000 mg) by mouth every 8 (eight) hours as needed.   alendronate  (FOSAMAX ) 70 MG tablet Take 1 tablet (70 mg total) by mouth every 7 (seven) days. Take with a full glass of water  on an empty stomach.   Blood Pressure Monitor MISC Check BP regularly.   Calcium Carb-Cholecalciferol (CALCIUM-VITAMIN D ) 600-400 MG-UNIT TABS Take 1 tablet by mouth 3 (three) times a week.   celecoxib  (CELEBREX ) 100 MG capsule Take 1 capsule (100 mg total) by mouth daily.   Cholecalciferol 25 MCG (1000 UT) capsule Take by mouth. 1/2 tablet   clobetasol  (TEMOVATE ) 0.05 % external solution Apply 1 Application topically to scalp daily as needed for flares.   estradiol  (ESTRACE ) 0.1 MG/GM vaginal cream Place 1 Applicatorful vaginally once a week.   fluocinonide  (LIDEX ) 0.05 % external solution APPLY TO AFFECTED AREA(S) AT BEDTIME (Patient taking differently: Apply 1 application  topically See admin instructions. Use once or twice a week)   Glucosamine-Chondroit-Vit C-Mn (GLUCOSAMINE-CHONDROITIN) CAPS Take 1 capsule by mouth daily.   ibuprofen  (ADVIL) 200 MG tablet Take 200 mg by mouth every 6 (six) hours as needed.   Multiple Vitamin (MULTIVITAMIN) tablet Take 1 tablet by mouth daily. Centrum Silver 50+   Omega-3 Fatty Acids (FISH OIL) 1000 MG CAPS Take 1 capsule by mouth daily.   telmisartan  (MICARDIS ) 40 MG tablet Take 1 tablet (40 mg total) by mouth daily.   tretinoin (RETIN-A) 0.05 % cream Apply 1 application topically every 30 (thirty) days. Apply to affected area as needed for Acne   Turmeric 500 MG CAPS Take 1 tablet by mouth as needed.   No facility-administered encounter medications on file as of 03/17/2024.     Lab Results  Component Value Date   WBC 4.7 07/16/2023   HGB 12.5 07/16/2023   HCT 38.5 07/16/2023   PLT 306.0 07/16/2023   GLUCOSE 90 03/14/2024   CHOL 218 (H) 03/14/2024   TRIG 76.0 03/14/2024   HDL 71.90 03/14/2024   LDLDIRECT 148.9 08/15/2013   LDLCALC 131 (H) 03/14/2024   ALT 24 03/14/2024   AST 22 03/14/2024   NA 138 03/14/2024   K 4.1 03/14/2024   CL 101 03/14/2024   CREATININE 0.75 03/14/2024   BUN 13 03/14/2024   CO2 31 03/14/2024   TSH 1.68 12/29/2023    DG Bone Density Result Date: 07/09/2023 EXAM: DUAL X-RAY ABSORPTIOMETRY (DXA) FOR BONE MINERAL DENSITY IMPRESSION: Patient:             Aunesty, Tyson Birth Date:          1961-01-31    [age] Height / Weight:     63.0 in.    122.0 lbs. Sex / Ethnic:        Female    White Facility ID: Referring Physician: Dellar Fenton Measured:            07/09/2023 2:49:04 PM (14.10) Analyzed:  07/09/2023 3:02:48 PM (14.10) Dual[facility]em[facility] Bone Density Trend BMD     Young-Adult Age-Matched Region       (g/cm2) T-score     Z-score     WHO Classification Neck Left       0.804   -1.7        -0.4        Osteopenia Right      0.753   -2.0        -0.7        Osteopenia Mean       0.779   -1.9        -0.6        Osteopenia Difference 0.051   -0.4        -0.4         - Total Left       0.843   -1.3        -0.3        Osteopenia Right      0.778   -1.8         -0.8        Osteopenia Mean       0.810   -1.6        -0.5        Osteopenia Difference 0.064   -0.5        -0.5         - Hip Axis Length Comparison (mm) (Right = 98.2 mm)  (Mean = 104.0 mm)  (Left = 97.2 mm) Trend: Neck Right Change vs Change vs Measured   Age     BMD     Previous  Previous Date       (years) (g/cm2) (g/cm2)   (%) 07/09/2023 61.8    0.753   -0.189*   -20.1* 12/02/2005 44.2    0.942   -         - Your patient Harsimran Westman completed a BMD test on 07/09/2023 using the [employer] DXA System (software version: 14.10) manufactured by [employer]. The following summarizes the results of our evaluation. Technologist:VLM PATIENT BIOGRAPHICAL: Name: Ariyanah, Aguado [name]: [id number] Birth Date: October 09, 1961 Height: 63.0 in. Gender: Female Exam Date: 07/09/2023 Weight: 122.0 lbs. Indications: Caucasian, family hx hip fracture, Postmenopausal, Rheumatoid Arthritis Fractures: Treatments: calcium w/ vit D, Estradiol , hydrochloroquine DENSITOMETRY RESULTS: Site         Region     Measured Date Measured Age WHO Classification Young Adult T-score BMD         %Change vs. Previous Significant Change (*) AP Spine L1-L2 07/09/2023 61.8 Osteoporosis -3.6 0.742 g/cm2 -28.2% Yes AP Spine L1-L2 12/02/2005 44.2 Osteopenia -1.2 1.034 g/cm2 - - Left Forearm Radius 33% 07/09/2023 61.8 Osteoporosis -4.0 0.526 g/cm2 - - [facility]em[facility] Neck Right 07/09/2023 61.8 Osteopenia -2.0 0.753 g/cm2 -20.1% Yes [employer] Neck Right 12/02/2005 44.2 Normal -0.7 0.942 g/cm2 - - ASSESSMENT: The BMD measured at Forearm Radius 33% is 0.526 g/cm2 with a T-score of -4.0. This patient is considered osteoporotic according to World Health Organization [facility]) criteria. L-3 & L-4 were excluded due to surgical hardware. Compared with prior study, there has been significant decrease in the spine. Compared with prior study, there has been significant decrease in the total hip. The scan quality is good. World Health Organization [facility]) criteria  for post-menopausal, Caucasian Women: Normal:                   T-score at or above -1 SD Osteopenia/low  bone mass: T-score between -1 and -2.5 SD Osteoporosis:             T-score at or below -2.5 SD RECOMMENDATIONS: 1. All patients should optimize calcium and vitamin D  intake. 2. Consider FDA-approved medical therapies in postmenopausal women and men aged 80 years and older, based on the following: a. A hip or vertebral(clinical or morphometric) fracture b. T-score < -2.5 at the femoral neck or spine after appropriate evaluation to exclude secondary causes c. Low bone mass (T-score between -1.0 and -2.5 at the femoral neck or spine) and a 10-year probability of a hip fracture > 3% or a 10-year probability of a major osteoporosis-related fracture > 20% based on the [place] -adapted WHO algorithm 3. Clinician judgment and/or patient preferences may indicate treatment for people with 10-year fracture probabilities above or below these levels FOLLOW-UP: People with diagnosed cases of osteoporosis or at high risk for fracture should have regular bone mineral density tests. For patients eligible for Medicare, routine testing is allowed once every 2 years. The testing frequency can be increased to one year for patients who have rapidly progressing disease, those who are receiving or discontinuing medical therapy to restore bone mass, or have additional risk factors. I have reviewed this report, and agree with the above findings. [facility] Electronically Signed   By: Dina  Arceo M.D.   On: 07/09/2023 15:12       Assessment & Plan:  Routine general medical examination at a health care facility  Hypercholesterolemia Assessment & Plan: The 10-year ASCVD risk score (Arnett DK, et al., 2019) is: 3.7%   Values used to calculate the score:     Age: 74 years     Sex: Female     Is Non-Hispanic African American: No     Diabetic: No     Tobacco smoker: No     Systolic Blood Pressure: 110 mmHg     Is BP  treated: Yes     HDL Cholesterol: 71.9 mg/dL     Total Cholesterol: 218 mg/dL  Continue on zetia. Low cholesterol diet and exercise. Follow lipid panel.   Orders: -     TSH; Future -     Lipid panel; Future -     Hepatic function panel; Future -     Basic metabolic panel with GFR; Future  Essential hypertension, benign Assessment & Plan: On micardis  40mg  q day.  Blood pressures as outlined.  Follow pressures.  Follow metabolic panel. No changes in medication.   Orders: -     CBC with Differential/Platelet; Future  Health care maintenance Assessment & Plan: Physical today 03/17/24.Aaron Aas  PAP 09/17/20 - negative with negative HPV.  Repeat pap today. Screening mammogram 03/18/24 - Birads 0. F/u right breast mammogram 03/25/24- Birads II.  Mammogram ordered. Colonoscopy 08/2017 - recommended f/u in 5 years.  Colonoscopy 10/2022 - non bleeding internal hemorrhoids.  Recommended f/u in 7 years.    Screening for cervical cancer -     Cytology - PAP  Encounter for screening mammogram for malignant neoplasm of breast -     3D Screening Mammogram, Left and Right; Future  Hyperbilirubinemia Assessment & Plan: Slightly elevated. Remainder of liver panel wnl.  Previous ultrasound ok. Stable. Follow.    Osteoporosis, unspecified osteoporosis type, unspecified pathological fracture presence Assessment & Plan: Have discussed treatment options. Discussed today - starting medication. She wants to start fosamax . Continue calcim, vitamin D  and weight bearing exercise.    Seronegative rheumatoid arthritis (HCC) Assessment &  Plan: Seeing rheumatology.  Doing exercise. Follow. Stable.    Obstructive sleep apnea syndrome Assessment & Plan: CPAP.       Dellar Fenton, MD

## 2024-03-17 NOTE — Assessment & Plan Note (Signed)
 On micardis  40mg  q day.  Blood pressures as outlined.  Follow pressures.  Follow metabolic panel. No changes in medication.

## 2024-03-17 NOTE — Assessment & Plan Note (Addendum)
 Physical today 03/17/24.Sara Perry  PAP 09/17/20 - negative with negative HPV.  Repeat pap today. Screening mammogram 03/18/24 - Birads 0. F/u right breast mammogram 03/25/24- Birads II.  Mammogram ordered. Colonoscopy 08/2017 - recommended f/u in 5 years.  Colonoscopy 10/2022 - non bleeding internal hemorrhoids.  Recommended f/u in 7 years.

## 2024-03-17 NOTE — Addendum Note (Signed)
 Addended by: Raejean Bullock on: 03/17/2024 01:50 PM   Modules accepted: Orders

## 2024-03-17 NOTE — Telephone Encounter (Signed)
 Medication has been sent.

## 2024-03-17 NOTE — Telephone Encounter (Signed)
 Copied from CRM [id number]. Topic: Clinical - Medication Question >> Mar 17, 2024 11:23 AM Sara Perry wrote: Reason for CRM: Pt stated that she seen Dr.Scott today was informed that she will be sending a medication for hemorrhoids to the pharmacy today. Pt stated that she is at the pharmacy and they stated that they don't have the medication. Pt would like for the medication to be sent to the pharmacy today if possible and would also like a callback with a update.

## 2024-03-17 NOTE — Assessment & Plan Note (Signed)
 Slightly elevated. Remainder of liver panel wnl.  Previous ultrasound ok. Stable. Follow.

## 2024-03-17 NOTE — Assessment & Plan Note (Signed)
 Have discussed treatment options. Discussed today - starting medication. She wants to start fosamax . Continue calcim, vitamin D  and weight bearing exercise.

## 2024-03-17 NOTE — Assessment & Plan Note (Signed)
 Seeing rheumatology.  Doing exercise. Follow. Stable.

## 2024-03-17 NOTE — Assessment & Plan Note (Signed)
 CPAP.

## 2024-03-17 NOTE — Assessment & Plan Note (Addendum)
 The 10-year ASCVD risk score (Arnett DK, et al., 2019) is: 3.7%   Values used to calculate the score:     Age: 63 years     Sex: Female     Is Non-Hispanic African American: No     Diabetic: No     Tobacco smoker: No     Systolic Blood Pressure: 110 mmHg     Is BP treated: Yes     HDL Cholesterol: 71.9 mg/dL     Total Cholesterol: 218 mg/dL  Continue on zetia. Low cholesterol diet and exercise. Follow lipid panel.

## 2024-03-20 DIAGNOSIS — G4733 Obstructive sleep apnea (adult) (pediatric): Secondary | ICD-10-CM | POA: Diagnosis not present

## 2024-03-22 LAB — CYTOLOGY - PAP
Comment: NEGATIVE
Diagnosis: NEGATIVE
High risk HPV: NEGATIVE

## 2024-03-23 ENCOUNTER — Ambulatory Visit: Payer: Self-pay | Admitting: Internal Medicine

## 2024-04-19 DIAGNOSIS — G4733 Obstructive sleep apnea (adult) (pediatric): Secondary | ICD-10-CM | POA: Diagnosis not present

## 2024-04-20 ENCOUNTER — Other Ambulatory Visit: Payer: Self-pay | Admitting: Internal Medicine

## 2024-04-20 MED ORDER — CELECOXIB 100 MG PO CAPS
100.0000 mg | ORAL_CAPSULE | Freq: Every day | ORAL | 0 refills | Status: DC
Start: 2024-04-20 — End: 2024-06-01
  Filled 2024-04-20: qty 30, 30d supply, fill #0

## 2024-04-21 ENCOUNTER — Other Ambulatory Visit: Payer: Self-pay

## 2024-04-22 ENCOUNTER — Other Ambulatory Visit: Payer: Self-pay

## 2024-04-29 ENCOUNTER — Ambulatory Visit
Admission: RE | Admit: 2024-04-29 | Discharge: 2024-04-29 | Disposition: A | Discharge status: Home / Self Care | Source: Ambulatory Visit | Attending: Internal Medicine | Admitting: Internal Medicine

## 2024-04-29 ENCOUNTER — Other Ambulatory Visit: Payer: Self-pay

## 2024-04-29 DIAGNOSIS — D2271 Melanocytic nevi of right lower limb, including hip: Secondary | ICD-10-CM | POA: Diagnosis not present

## 2024-04-29 DIAGNOSIS — Z1231 Encounter for screening mammogram for malignant neoplasm of breast: Secondary | ICD-10-CM | POA: Insufficient documentation

## 2024-04-29 DIAGNOSIS — D2262 Melanocytic nevi of left upper limb, including shoulder: Secondary | ICD-10-CM | POA: Diagnosis not present

## 2024-04-29 DIAGNOSIS — D2272 Melanocytic nevi of left lower limb, including hip: Secondary | ICD-10-CM | POA: Diagnosis not present

## 2024-04-29 DIAGNOSIS — D225 Melanocytic nevi of trunk: Secondary | ICD-10-CM | POA: Diagnosis not present

## 2024-04-29 DIAGNOSIS — L821 Other seborrheic keratosis: Secondary | ICD-10-CM | POA: Diagnosis not present

## 2024-04-29 DIAGNOSIS — D2261 Melanocytic nevi of right upper limb, including shoulder: Secondary | ICD-10-CM | POA: Diagnosis not present

## 2024-04-29 DIAGNOSIS — L84 Corns and callosities: Secondary | ICD-10-CM | POA: Diagnosis not present

## 2024-04-29 DIAGNOSIS — L218 Other seborrheic dermatitis: Secondary | ICD-10-CM | POA: Diagnosis not present

## 2024-04-29 MED ORDER — CLOBETASOL PROPIONATE 0.05 % EX SOLN
CUTANEOUS | 1 refills | Status: DC
  Filled 2024-04-29: qty 50, 60d supply, fill #0
  Filled 2024-08-09: qty 50, 30d supply, fill #0
  Filled 2024-09-16: qty 50, 30d supply, fill #1

## 2024-05-12 ENCOUNTER — Other Ambulatory Visit: Payer: Self-pay

## 2024-06-01 ENCOUNTER — Other Ambulatory Visit: Payer: Self-pay | Admitting: Internal Medicine

## 2024-06-01 ENCOUNTER — Other Ambulatory Visit: Payer: Self-pay

## 2024-06-01 NOTE — Telephone Encounter (Unsigned)
 Copied from CRM #[id number]. Topic: General - Other >> Jun 01, 2024  4:06 PM Delon DASEN wrote: Reason for CRM: patient returning call from office- inquiring about refill on celecoxib  (CELEBREX ) 100 MG capsule

## 2024-06-02 ENCOUNTER — Other Ambulatory Visit: Payer: Self-pay

## 2024-06-02 MED ORDER — CELECOXIB 100 MG PO CAPS
100.0000 mg | ORAL_CAPSULE | Freq: Every day | ORAL | 0 refills | Status: AC
Start: 2024-06-02 — End: ?
  Filled 2024-06-02: qty 30, 30d supply, fill #0

## 2024-06-30 ENCOUNTER — Ambulatory Visit: Admitting: Endocrinology

## 2024-07-18 ENCOUNTER — Other Ambulatory Visit (INDEPENDENT_AMBULATORY_CARE_PROVIDER_SITE_OTHER)

## 2024-07-18 DIAGNOSIS — E7800 Pure hypercholesterolemia, unspecified: Secondary | ICD-10-CM | POA: Diagnosis not present

## 2024-07-18 DIAGNOSIS — I10 Essential (primary) hypertension: Secondary | ICD-10-CM

## 2024-07-18 LAB — CBC WITH DIFFERENTIAL/PLATELET
Basophils Absolute: 0 K/uL (ref 0.0–0.1)
Basophils Relative: 0.3 % (ref 0.0–3.0)
Eosinophils Absolute: 0.1 K/uL (ref 0.0–0.7)
Eosinophils Relative: 2 % (ref 0.0–5.0)
HCT: 39.4 % (ref 36.0–46.0)
Hemoglobin: 13 g/dL (ref 12.0–15.0)
Lymphocytes Relative: 34.4 % (ref 12.0–46.0)
Lymphs Abs: 1.4 K/uL (ref 0.7–4.0)
MCHC: 33 g/dL (ref 30.0–36.0)
MCV: 97.4 fl (ref 78.0–100.0)
Monocytes Absolute: 0.3 K/uL (ref 0.1–1.0)
Monocytes Relative: 8.1 % (ref 3.0–12.0)
Neutro Abs: 2.2 K/uL (ref 1.4–7.7)
Neutrophils Relative %: 55.2 % (ref 43.0–77.0)
Platelets: 266 K/uL (ref 150.0–400.0)
RBC: 4.04 Mil/uL (ref 3.87–5.11)
RDW: 13.4 % (ref 11.5–15.5)
WBC: 4.1 K/uL (ref 4.0–10.5)

## 2024-07-18 LAB — LIPID PANEL
Cholesterol: 220 mg/dL — ABNORMAL HIGH (ref 0–200)
HDL: 64 mg/dL (ref >39.00)
LDL Cholesterol: 133 mg/dL — ABNORMAL HIGH (ref 0–99)
NonHDL: 155.53
Total CHOL/HDL Ratio: 3
Triglycerides: 115 mg/dL (ref 0.0–149.0)
VLDL: 23 mg/dL (ref 0.0–40.0)

## 2024-07-18 LAB — BASIC METABOLIC PANEL WITH GFR
BUN: 17 mg/dL (ref 6–23)
CO2: 27 meq/L (ref 19–32)
Calcium: 9.7 mg/dL (ref 8.4–10.5)
Chloride: 104 meq/L (ref 96–112)
Creatinine, Ser: 0.66 mg/dL (ref 0.40–1.20)
GFR: 93.69 mL/min (ref >60.00)
Glucose, Bld: 85 mg/dL (ref 70–99)
Potassium: 5.1 meq/L (ref 3.5–5.1)
Sodium: 141 meq/L (ref 135–145)

## 2024-07-18 LAB — HEPATIC FUNCTION PANEL
ALT: 15 U/L (ref 0–35)
AST: 14 U/L (ref 0–37)
Albumin: 4.5 g/dL (ref 3.5–5.2)
Alkaline Phosphatase: 50 U/L (ref 39–117)
Bilirubin, Direct: 0.1 mg/dL (ref 0.0–0.3)
Total Bilirubin: 0.9 mg/dL (ref 0.2–1.2)
Total Protein: 6.7 g/dL (ref 6.0–8.3)

## 2024-07-18 LAB — TSH: TSH: 1.98 u[IU]/mL (ref 0.35–5.50)

## 2024-07-21 ENCOUNTER — Encounter: Payer: Self-pay | Admitting: Internal Medicine

## 2024-07-21 ENCOUNTER — Ambulatory Visit: Admitting: Internal Medicine

## 2024-07-21 VITALS — BP 110/72 | HR 82 | Resp 16 | Ht 63.0 in | Wt 116.0 lb

## 2024-07-21 DIAGNOSIS — M0600 Rheumatoid arthritis without rheumatoid factor, unspecified site: Secondary | ICD-10-CM | POA: Diagnosis not present

## 2024-07-21 DIAGNOSIS — E875 Hyperkalemia: Secondary | ICD-10-CM

## 2024-07-21 DIAGNOSIS — I10 Essential (primary) hypertension: Secondary | ICD-10-CM

## 2024-07-21 DIAGNOSIS — Z1211 Encounter for screening for malignant neoplasm of colon: Secondary | ICD-10-CM

## 2024-07-21 DIAGNOSIS — M810 Age-related osteoporosis without current pathological fracture: Secondary | ICD-10-CM | POA: Diagnosis not present

## 2024-07-21 DIAGNOSIS — E7800 Pure hypercholesterolemia, unspecified: Secondary | ICD-10-CM

## 2024-07-21 DIAGNOSIS — G4733 Obstructive sleep apnea (adult) (pediatric): Secondary | ICD-10-CM | POA: Diagnosis not present

## 2024-07-21 NOTE — Assessment & Plan Note (Signed)
 The 10-year ASCVD risk score (Arnett DK, et al., 2019) is: 4%   Values used to calculate the score:     Age: 63 years     Clincally relevant sex: Female     Is Non-Hispanic African American: No     Diabetic: No     Tobacco smoker: No     Systolic Blood Pressure: 110 mmHg     Is BP treated: Yes     HDL Cholesterol: 64 mg/dL     Total Cholesterol: 220 mg/dL  Continue on zetia. Low cholesterol diet and exercise. Follow lipid panel.

## 2024-07-21 NOTE — Progress Notes (Signed)
 Subjective:    [name], female    DOB: [birth year], 63 y.o.   MRN: [id number]  Patient here for  Chief Complaint  Patient presents with   Medical Management of Chronic Issues    HPI Here for a scheduled follow up - follow up regarding hypertension, sleep apnea and hypercholesterolemia. Continues on micardis . Blood pressure has been well controlled.  Is s/p left shoulder arthroplasty and PT. Saw Dr Janit 05/22/23 - hallux valgus bilateral and pes planovalgus deformity (bilateral). Recommended orthotics/toe splint. Saw rheumatology 05/2023 - seronegative RA. Suggested trial of hydroxychloroquine . Did not tolerate. Had f/u 10/29/23 - stable. Hold on further medication. Continues cpap. Saw endocriology 12/29/23 - to discuss recent bone density and treatment. Discussed tymlos  and reclast. Last visit, discussed treatment. Wanted to start fosamax . Started taking a few weeks ago. Tolerating. Continues to exercise. Stays active. Using cpap. No chest pain or sob reported. No abdominal pain or bowel change reported. Joints are better. Still issues with bunions and her right great toe - overall stable. Discussed labs.    Past Medical History:  Diagnosis Date   Arthritis    Complication of anesthesia    pt states she is very sensitive to most drugs, usually needs 1/2 of whatever other people get. She states as a child/teenager she had difficulty waking up after surgery due to the sensitivity that she has to drugs.   Eczema    Frequent UTI    H/O lymphadenopathy    With previous submandibular node removals.   Hypertension    Recurrent sinus infections    Sleep apnea    uses c-pap   Past Surgical History:  Procedure Laterality Date   COLONOSCOPY WITH PROPOFOL  N/A 08/25/2017   Procedure: COLONOSCOPY WITH PROPOFOL ;  Surgeon: Jinny Carmine, MD;  Location: [facility];  Service: Endoscopy;  Laterality: N/A;   COLONOSCOPY WITH PROPOFOL  N/A 10/17/2022   Procedure: COLONOSCOPY WITH  PROPOFOL ;  Surgeon: Jinny Carmine, MD;  Location: [facility];  Service: Endoscopy;  Laterality: N/A;   LUMBAR FUSION  12/26/2019   l4-l5   Lymph Node Removal  1984   NECK SURGERY  1977   H/O Right neck surgery secondary to a benign growth with when patient was 38 or 63 yo   skin lesion extraction  2013   basal cell - Dr Arlyss   TOTAL SHOULDER ARTHROPLASTY Left 12/18/2022   Procedure: TOTAL SHOULDER ARTHROPLASTY;  Surgeon: Cristy Bonner DASEN, MD;  Location: [facility];  Service: Orthopedics;  Laterality: Left;   Uterine Polyps Removed  2004   Family History  Problem Relation Age of Onset   Hyperlipidemia Mother    Thyroid  disease Mother    Heart disease Mother        Heart Valve problems   Coronary artery disease Father        Stents placed   Hypertension Other        uncle   Parkinson's disease Paternal Uncle    Parkinson's disease Maternal Grandmother    Heart disease Maternal Grandfather    Heart disease Paternal Grandfather        MI   Breast cancer Neg Hx    Social History   Socioeconomic History   Marital status: Divorced    Spouse name: Not on file   Number of children: 2   Years of education: Not on file   Highest education level: Not on file  Occupational History    Employer: [employer]  Tobacco  Use   Smoking status: Never   Smokeless tobacco: Never  Vaping Use   Vaping status: Never Used  Substance and Sexual Activity   Alcohol use: Yes    Comment: Occasional   Drug use: No   Sexual activity: Not on file  Other Topics Concern   Not on file  Social History Narrative   Not on file   Social Drivers of Health   Financial Resource Strain: Not on file  Food Insecurity: Not on file  Transportation Needs: Not on file  Physical Activity: Not on file  Stress: Not on file  Social Connections: Not on file     Review of Systems  Constitutional:  Negative for appetite change and unexpected weight change.  HENT:  Negative for congestion and  sinus pressure.   Respiratory:  Negative for cough, chest tightness and shortness of breath.   Cardiovascular:  Negative for chest pain, palpitations and leg swelling.  Gastrointestinal:  Negative for abdominal pain, diarrhea, nausea and vomiting.  Genitourinary:  Negative for difficulty urinating and dysuria.  Musculoskeletal:  Negative for myalgias.       Joints better.   Skin:  Negative for color change and rash.  Neurological:  Negative for dizziness and headaches.  Psychiatric/Behavioral:  Negative for agitation and dysphoric mood.        Objective:     BP 110/72   Pulse 82   Resp 16   Ht 5' 3 (1.6 m)   Wt 116 lb (52.6 kg)   LMP 12/01/2008   SpO2 99%   BMI 20.55 kg/m  Wt Readings from Last 3 Encounters:  07/21/24 116 lb (52.6 kg)  03/17/24 120 lb 9.6 oz (54.7 kg)  01/12/24 120 lb (54.4 kg)    Physical Exam Vitals reviewed.  Constitutional:      General: She is not in acute distress.    Appearance: Normal appearance.  HENT:     Head: Normocephalic and atraumatic.     Right Ear: External ear normal.     Left Ear: External ear normal.     Mouth/Throat:     Pharynx: No oropharyngeal exudate or posterior oropharyngeal erythema.  Eyes:     General: No scleral icterus.       Right eye: No discharge.        Left eye: No discharge.     Conjunctiva/sclera: Conjunctivae normal.  Neck:     Thyroid : No thyromegaly.  Cardiovascular:     Rate and Rhythm: Normal rate and regular rhythm.  Pulmonary:     Effort: No respiratory distress.     Breath sounds: Normal breath sounds. No wheezing.  Abdominal:     General: Bowel sounds are normal.     Palpations: Abdomen is soft.     Tenderness: There is no abdominal tenderness.  Musculoskeletal:        General: No swelling or tenderness.     Cervical back: Neck supple. No tenderness.  Lymphadenopathy:     Cervical: No cervical adenopathy.  Skin:    Findings: No erythema or rash.  Neurological:     Mental Status: She is  alert.  Psychiatric:        Mood and Affect: Mood normal.        Behavior: Behavior normal.         Outpatient Encounter Medications as of 07/21/2024  Medication Sig   Acetaminophen  Extra Strength 500 MG CAPS Take 2 tablets (1,000 mg) by mouth every 8 (eight) hours as needed.  alendronate  (FOSAMAX ) 70 MG tablet Take 1 tablet (70 mg total) by mouth every 7 (seven) days. Take with a full glass of water  on an empty stomach.   Blood Pressure Monitor MISC Check BP regularly.   Calcium Carb-Cholecalciferol (CALCIUM-VITAMIN D ) 600-400 MG-UNIT TABS Take 1 tablet by mouth 3 (three) times a week.   celecoxib  (CELEBREX ) 100 MG capsule Take 1 capsule (100 mg total) by mouth daily.   Cholecalciferol 25 MCG (1000 UT) capsule Take by mouth. 1/2 tablet   clobetasol  (TEMOVATE ) 0.05 % external solution Apply 1 Application topically to scalp daily as needed for flares.   clobetasol  (TEMOVATE ) 0.05 % external solution Apply to affected scalp daily as needed for flares   estradiol  (ESTRACE ) 0.1 MG/GM vaginal cream Place 1 Applicatorful vaginally once a week.   fluocinonide  (LIDEX ) 0.05 % external solution APPLY TO AFFECTED AREA(S) AT BEDTIME (Patient taking differently: Apply 1 application  topically See admin instructions. Use once or twice a week)   Glucosamine-Chondroit-Vit C-Mn (GLUCOSAMINE-CHONDROITIN) CAPS Take 1 capsule by mouth daily.   hydrocortisone  (PROCTO-MED HC ) 2.5 % rectal cream Place 1 Application rectally 3 (three) times daily.   ibuprofen (ADVIL) 200 MG tablet Take 200 mg by mouth every 6 (six) hours as needed.   Multiple Vitamin (MULTIVITAMIN) tablet Take 1 tablet by mouth daily. Centrum Silver 50+   Omega-3 Fatty Acids (FISH OIL) 1000 MG CAPS Take 1 capsule by mouth daily.   telmisartan  (MICARDIS ) 40 MG tablet Take 1 tablet (40 mg total) by mouth daily.   tretinoin (RETIN-A) 0.05 % cream Apply 1 application topically every 30 (thirty) days. Apply to affected area as needed for Acne    Turmeric 500 MG CAPS Take 1 tablet by mouth as needed.   No facility-administered encounter medications on file as of 07/21/2024.     Lab Results  Component Value Date   WBC 4.1 07/18/2024   HGB 13.0 07/18/2024   HCT 39.4 07/18/2024   PLT 266.0 07/18/2024   GLUCOSE 85 07/18/2024   CHOL 220 (H) 07/18/2024   TRIG 115.0 07/18/2024   HDL 64.00 07/18/2024   LDLDIRECT 148.9 08/15/2013   LDLCALC 133 (H) 07/18/2024   ALT 15 07/18/2024   AST 14 07/18/2024   NA 141 07/18/2024   K 5.1 07/18/2024   CL 104 07/18/2024   CREATININE 0.66 07/18/2024   BUN 17 07/18/2024   CO2 27 07/18/2024   TSH 1.98 07/18/2024    MM 3D SCREENING MAMMOGRAM BILATERAL BREAST Result Date: 05/03/2024 CLINICAL DATA:  Screening. EXAM: DIGITAL SCREENING BILATERAL MAMMOGRAM WITH TOMOSYNTHESIS AND CAD TECHNIQUE: Bilateral screening digital craniocaudal and mediolateral oblique mammograms were obtained. Bilateral screening digital breast tomosynthesis was performed. The images were evaluated with computer-aided detection. COMPARISON:  Previous exam(s). ACR Breast Density Category b: There are scattered areas of fibroglandular density. FINDINGS: There are no findings suspicious for malignancy. IMPRESSION: No mammographic evidence of malignancy. A result letter of this screening mammogram will be mailed directly to the patient. RECOMMENDATION: Screening mammogram in one year. (Code:SM-B-01Y) BI-RADS CATEGORY  1: Negative. Electronically Signed   By: Debby Satterfield M.D.   On: 05/03/2024 14:26       Assessment & Plan:  Hypercholesterolemia Assessment & Plan: The 10-year ASCVD risk score (Arnett DK, et al., 2019) is: 4%   Values used to calculate the score:     Age: 23 years     Clincally relevant sex: Female     Is Non-Hispanic African American: No     Diabetic: No  Tobacco smoker: No     Systolic Blood Pressure: 110 mmHg     Is BP treated: Yes     HDL Cholesterol: 64 mg/dL     Total Cholesterol: 220 mg/dL   Continue on zetia. Low cholesterol diet and exercise. Follow lipid panel.    Serum potassium elevated Assessment & Plan: Discussed. Recheck potassium to confirm wnl.   Orders: -     Potassium; Future  Obstructive sleep apnea syndrome Assessment & Plan: Continue cpap.    Seronegative rheumatoid arthritis [facility]) Assessment & Plan: Seeing rheumatology.  Doing exercise. Follow. Intolerant to plaquenil . Stable. Joints better.    Osteoporosis, unspecified osteoporosis type, unspecified pathological fracture presence Assessment & Plan: Taking fosamax . Continue calcium, vitamin d  and weight bearing exercise.    Colon cancer screening Assessment & Plan: Colonoscopy 10/2022 - non bleeding internal hemorrhoids.  Recommended f/u in 7 years.    Essential hypertension, benign Assessment & Plan: On micardis  40mg  q day.  Blood pressures as outlined.  Follow pressures.  Follow metabolic panel. No change in medication.       Allena Hamilton, MD

## 2024-07-22 ENCOUNTER — Other Ambulatory Visit: Payer: Self-pay

## 2024-07-25 ENCOUNTER — Telehealth: Payer: Self-pay

## 2024-07-25 DIAGNOSIS — E875 Hyperkalemia: Secondary | ICD-10-CM

## 2024-07-25 NOTE — Telephone Encounter (Signed)
 Copied from CRM [id number]. Topic: Clinical - Request for Lab/Test Order >> Jul 25, 2024 10:51 AM Suzen RAMAN wrote: Reason for CRM: Patient was advised by Dr. Glendia that she could have her labs completed at at [facility] for Potassium. Please send orders to [facility].

## 2024-07-25 NOTE — Telephone Encounter (Signed)
 Patient aware of below.

## 2024-07-25 NOTE — Addendum Note (Signed)
 Addended by: GLENDIA ALLENA RAMAN on: 07/25/2024 12:45 PM   Modules accepted: Orders

## 2024-07-25 NOTE — Telephone Encounter (Signed)
 Order placed for f/u potassium to be drawn at [facility]

## 2024-07-31 ENCOUNTER — Encounter: Payer: Self-pay | Admitting: Internal Medicine

## 2024-07-31 NOTE — Assessment & Plan Note (Signed)
 On micardis  40mg  q day.  Blood pressures as outlined.  Follow pressures.  Follow metabolic panel. No change in medication.

## 2024-07-31 NOTE — Assessment & Plan Note (Signed)
 Taking fosamax . Continue calcium, vitamin d  and weight bearing exercise.

## 2024-07-31 NOTE — Assessment & Plan Note (Signed)
 Continue cpap.

## 2024-07-31 NOTE — Assessment & Plan Note (Signed)
 Discussed. Recheck potassium to confirm wnl.

## 2024-07-31 NOTE — Assessment & Plan Note (Signed)
Colonoscopy 10/2022 - non bleeding internal hemorrhoids.  Recommended f/u in 7 years.

## 2024-07-31 NOTE — Assessment & Plan Note (Signed)
 Seeing rheumatology.  Doing exercise. Follow. Intolerant to plaquenil . Stable. Joints better.

## 2024-08-04 ENCOUNTER — Other Ambulatory Visit: Payer: Self-pay

## 2024-08-04 ENCOUNTER — Other Ambulatory Visit: Payer: Self-pay | Admitting: Internal Medicine

## 2024-08-05 ENCOUNTER — Other Ambulatory Visit: Payer: Self-pay

## 2024-08-06 MED ORDER — ESTRADIOL 0.01 % VA CREA
1.0000 | TOPICAL_CREAM | VAGINAL | 1 refills | Status: AC
  Filled 2024-08-06: qty 42.5, 90d supply, fill #0

## 2024-08-06 MED ORDER — TELMISARTAN 40 MG PO TABS
40.0000 mg | ORAL_TABLET | Freq: Every day | ORAL | 1 refills | Status: AC
  Filled 2024-08-06: qty 90, 90d supply, fill #0

## 2024-08-06 NOTE — Telephone Encounter (Signed)
 Rx ok'd for telmisartan  and estrace  cream.

## 2024-08-07 ENCOUNTER — Other Ambulatory Visit: Payer: Self-pay

## 2024-08-09 ENCOUNTER — Other Ambulatory Visit: Payer: Self-pay

## 2024-08-10 ENCOUNTER — Other Ambulatory Visit
Admission: RE | Admit: 2024-08-10 | Discharge: 2024-08-10 | Disposition: A | Discharge status: Home / Self Care | Attending: Internal Medicine | Admitting: Internal Medicine

## 2024-08-10 ENCOUNTER — Other Ambulatory Visit

## 2024-08-10 DIAGNOSIS — E875 Hyperkalemia: Secondary | ICD-10-CM | POA: Diagnosis not present

## 2024-08-10 LAB — POTASSIUM: Potassium: 3.7 mmol/L (ref 3.5–5.1)

## 2024-08-11 ENCOUNTER — Ambulatory Visit: Payer: Self-pay | Admitting: Internal Medicine

## 2024-08-26 ENCOUNTER — Telehealth (INDEPENDENT_AMBULATORY_CARE_PROVIDER_SITE_OTHER): Admitting: Primary Care

## 2024-08-26 DIAGNOSIS — G4733 Obstructive sleep apnea (adult) (pediatric): Secondary | ICD-10-CM

## 2024-08-26 NOTE — Progress Notes (Signed)
 Virtual Visit via Video Note  I connected with Sara Perry on 08/26/24 at  3:00 PM EST by a video enabled telemedicine application and verified that I am speaking with the correct person using two identifiers.  Location: Patient: Home Provider: Office   I discussed the limitations of evaluation and management by telemedicine and the availability of in person appointments. The patient expressed understanding and agreed to proceed.  History of Present Illness:  63 year old female, never smoker followed for mild OSA on CPAP. Past medical history significant for HTN, Meniere's disease, seronegative RA, HLD.   Previous [facility] pulmonary encounter:  01/30/2023: OV with Kmarion Rawl,NP. Re-establish for OSA/CPAP management. HST in 2019 showed mild OSA, AHI 6/h. Reports clinical improvement with CPAP use. Pressure settings 5-15 cmH2O. Not wearing CPAP regularly since March due to shoulder surgery. Typically a side sleeper. Sleep with sling in recliner. Back to sleeping in her bed. Has worn CPAP last 4 nights. Machine is in working order. Masks breaks her skin out. Tried memory foam mask and liners. Nasal pillows do not work well since she's a mouth breather. DME is [employer]. Residual AHI 1.4/h. lower pressure settings to see if it helps with leaks 5-13 cmH2O.  06/26/2023 Patient presents today for follow up. She has been doing well with her CPAP. Sleeps with it most nights. She feels better rested when she sleeps with her CPAP. Without it, she has very loud snoring and is fatigued the next day. She would like to see if she could try a different mask. Sleeps with her mouth open with nasal mask. Denies drowsy driving, sleep parasomnias/paralysis.   05/26/2023-06/24/2023: CPAP 5-15 cmH2O 27/30 days; 83% >4 hr; average use 6 hr 59 min Pressure 95th 10.3 Leaks 95th 21.2 AHI 1.4   08/28/2023 Patient received new CPAP machine. She is doing well. No issues with current CPAP. She switch to different philip's  dreamwear full face mask. Feels she can move around more and mask is not as limiting. She reports some skin irration at times with new mask but hasn't found liners that works with her new mask. No long having nasal irritation. She sees more benefit from wearing CPAP than not.   [employer] download 07/28/2023 - 08/26/2023 Usage days 27/30 days (90%) Average usage days used 7 hours 26 minutes Pressure 5 to 13 cm H2O (10.8 cm H2O-95%) Air leaks 5.6 L/min (95%)  AHI 1.6  08/26/2024- Interim hx  Discussed the use of AI scribe software for clinical note transcription with the patient, who gave verbal consent to proceed.  History of Present Illness Sara Perry is a 63 year old female who presents for a one-year follow-up regarding mild sleep apnea.  She underwent a sleep study in 2019, which revealed mild sleep apnea with six apneic events per hour. She is currently using a CPAP machine with auto settings, a minimum pressure of 5, and a maximum pressure of 13. Her average pressure is about 6.5, and the maximum pressure needed is 10. Her apnea score is 1.1.  She sleeps well with the CPAP, using a hybrid mask with nasal pillows that allows her to sleep on her side. However, she experiences facial breakouts from the mask without a cloth cover. She has tried barrier creams and is exploring mask liners as a solution. She feels better when using the CPAP, although she can manage without it for a few days if necessary. She is compliant with her CPAP usage, averaging 97% compliance over the last 30  days with an average use of 7 hours and 23 minutes per night.  Her current medications include telmisartan  for blood pressure, estrogen vaginal suppository once a week, Fosamax  once a week, clobetasol  topical solution for eczema, and various supplements including turmeric, fish oil, calcium with vitamin D , and a multivitamin. She has previously taken Celebrex  but has not used it in a few months. She does not  routinely take ibuprofen or other anti-inflammatories.  Observations/Objective:  Appears well without overt respiratory symptoms   TEST/EVENTS:  07/02/2018 HST: 7.9/h, SpO2 89%  Assessment and Plan:  1. OSA (obstructive sleep apnea) (Primary)  Assessment & Plan Obstructive sleep apnea Mild obstructive sleep apnea diagnosed in 2019 with 6 apneic events per hour. Current CPAP therapy shows good compliance with 97% usage over the last 30 days, averaging 7 hours and 23 minutes per night. Pressure settings are auto-adjusting with a minimum of 5 cm H2O and a maximum of 13 cm H2O, averaging 6.5 cm H2O. Apnea score is 1.1, indicating well-controlled sleep apnea. No air leaks or significant events noted. Reports improved sleep quality with CPAP, though occasional mask discomfort and skin irritation occur. Uses a hybrid nasal pillow mask, which allows better side sleeping but causes facial breakouts due to lack of cloth cover. - Continue CPAP use nightly 4-6 hours or longer. - Renewed CPAP supplies prescription. - Recommended RespLabs CPAP mask liners for comfort and skin protection. - Ensure CPAP machine replacement every five years as covered by insurance. - Continue current CPAP maintenance schedule: tubing every 3 months, filter monthly, headgear and water  chamber every 6 months. - Use distilled water  in CPAP machine.  Recording duration: 13 minutes   Follow Up Instructions:  - FU in 1 year or sooner if needed   I discussed the assessment and treatment plan with the patient. The patient was provided an opportunity to ask questions and all were answered. The patient agreed with the plan and demonstrated an understanding of the instructions.   The patient was advised to call back or seek an in-person evaluation if the symptoms worsen or if the condition fails to improve as anticipated.  I provided 22 minutes of non-face-to-face time during this encounter.   Almarie LELON Ferrari, NP

## 2024-09-16 ENCOUNTER — Other Ambulatory Visit: Payer: Self-pay

## 2024-11-04 ENCOUNTER — Other Ambulatory Visit (INDEPENDENT_AMBULATORY_CARE_PROVIDER_SITE_OTHER)

## 2024-11-04 ENCOUNTER — Ambulatory Visit: Payer: Self-pay | Admitting: Internal Medicine

## 2024-11-04 ENCOUNTER — Other Ambulatory Visit: Payer: Self-pay

## 2024-11-04 DIAGNOSIS — E7800 Pure hypercholesterolemia, unspecified: Secondary | ICD-10-CM

## 2024-11-04 DIAGNOSIS — E875 Hyperkalemia: Secondary | ICD-10-CM | POA: Diagnosis not present

## 2024-11-04 LAB — BASIC METABOLIC PANEL WITH GFR
BUN: 16 mg/dL (ref 6–23)
CO2: 31 meq/L (ref 19–32)
Calcium: 9.7 mg/dL (ref 8.4–10.5)
Chloride: 103 meq/L (ref 96–112)
Creatinine, Ser: 0.74 mg/dL (ref 0.40–1.20)
GFR: 86.23 mL/min
Glucose, Bld: 78 mg/dL (ref 70–99)
Potassium: 4.2 meq/L (ref 3.5–5.1)
Sodium: 139 meq/L (ref 135–145)

## 2024-11-04 LAB — LIPID PANEL
Cholesterol: 228 mg/dL — ABNORMAL HIGH (ref 28–200)
HDL: 73.5 mg/dL
LDL Cholesterol: 137 mg/dL — ABNORMAL HIGH (ref 10–99)
NonHDL: 154.6
Total CHOL/HDL Ratio: 3
Triglycerides: 88 mg/dL (ref 10.0–149.0)
VLDL: 17.6 mg/dL (ref 0.0–40.0)

## 2024-11-04 LAB — HEPATIC FUNCTION PANEL
ALT: 18 U/L (ref 3–35)
AST: 16 U/L (ref 5–37)
Albumin: 4.7 g/dL (ref 3.5–5.2)
Alkaline Phosphatase: 46 U/L (ref 39–117)
Bilirubin, Direct: 0.2 mg/dL (ref 0.1–0.3)
Total Bilirubin: 1.1 mg/dL (ref 0.2–1.2)
Total Protein: 7.1 g/dL (ref 6.0–8.3)

## 2024-11-08 ENCOUNTER — Ambulatory Visit: Admitting: Internal Medicine

## 2024-11-10 ENCOUNTER — Other Ambulatory Visit (HOSPITAL_COMMUNITY): Payer: Self-pay

## 2024-11-10 ENCOUNTER — Other Ambulatory Visit: Payer: Self-pay

## 2024-11-10 ENCOUNTER — Other Ambulatory Visit: Payer: Self-pay | Admitting: Internal Medicine

## 2024-11-10 MED ORDER — ALENDRONATE SODIUM 70 MG PO TABS
70.0000 mg | ORAL_TABLET | ORAL | 3 refills | Status: AC
  Filled 2024-11-10: qty 12, 84d supply, fill #0

## 2024-11-11 ENCOUNTER — Other Ambulatory Visit: Payer: Self-pay

## 2024-11-11 ENCOUNTER — Other Ambulatory Visit (HOSPITAL_COMMUNITY): Payer: Self-pay

## 2024-11-11 MED ORDER — CLOBETASOL PROPIONATE 0.05 % EX SOLN
CUTANEOUS | 1 refills | Status: AC
  Filled 2024-11-11: qty 50, fill #0

## 2024-11-14 ENCOUNTER — Other Ambulatory Visit: Payer: Self-pay

## 2024-11-17 ENCOUNTER — Ambulatory Visit: Admitting: Internal Medicine

## 2025-01-10 ENCOUNTER — Ambulatory Visit: Admitting: Internal Medicine
